# Patient Record
Sex: Male | Born: 1945 | Race: White | Hispanic: No | Marital: Married | Smoking: Never smoker
Health system: Southern US, Community
[De-identification: ages and names within clinical notes are randomized; demographics above are authoritative.]

## PROBLEM LIST (undated history)

## (undated) DIAGNOSIS — L570 Actinic keratosis: Secondary | ICD-10-CM

## (undated) DIAGNOSIS — I2510 Atherosclerotic heart disease of native coronary artery without angina pectoris: Secondary | ICD-10-CM

## (undated) DIAGNOSIS — E039 Hypothyroidism, unspecified: Secondary | ICD-10-CM

## (undated) DIAGNOSIS — C4491 Basal cell carcinoma of skin, unspecified: Secondary | ICD-10-CM

## (undated) DIAGNOSIS — Z8619 Personal history of other infectious and parasitic diseases: Secondary | ICD-10-CM

## (undated) DIAGNOSIS — E119 Type 2 diabetes mellitus without complications: Secondary | ICD-10-CM

## (undated) DIAGNOSIS — G4730 Sleep apnea, unspecified: Secondary | ICD-10-CM

## (undated) DIAGNOSIS — Z9989 Dependence on other enabling machines and devices: Secondary | ICD-10-CM

## (undated) DIAGNOSIS — I340 Nonrheumatic mitral (valve) insufficiency: Secondary | ICD-10-CM

## (undated) DIAGNOSIS — E785 Hyperlipidemia, unspecified: Secondary | ICD-10-CM

## (undated) DIAGNOSIS — M1990 Unspecified osteoarthritis, unspecified site: Secondary | ICD-10-CM

## (undated) DIAGNOSIS — C4492 Squamous cell carcinoma of skin, unspecified: Secondary | ICD-10-CM

## (undated) DIAGNOSIS — T7840XA Allergy, unspecified, initial encounter: Secondary | ICD-10-CM

## (undated) DIAGNOSIS — I639 Cerebral infarction, unspecified: Secondary | ICD-10-CM

## (undated) DIAGNOSIS — T8859XA Other complications of anesthesia, initial encounter: Secondary | ICD-10-CM

## (undated) DIAGNOSIS — K219 Gastro-esophageal reflux disease without esophagitis: Secondary | ICD-10-CM

## (undated) DIAGNOSIS — I10 Essential (primary) hypertension: Secondary | ICD-10-CM

## (undated) DIAGNOSIS — D099 Carcinoma in situ, unspecified: Secondary | ICD-10-CM

## (undated) DIAGNOSIS — H269 Unspecified cataract: Secondary | ICD-10-CM

## (undated) DIAGNOSIS — G4733 Obstructive sleep apnea (adult) (pediatric): Secondary | ICD-10-CM

## (undated) DIAGNOSIS — U071 COVID-19: Secondary | ICD-10-CM

## (undated) DIAGNOSIS — T4145XA Adverse effect of unspecified anesthetic, initial encounter: Secondary | ICD-10-CM

## (undated) DIAGNOSIS — I5189 Other ill-defined heart diseases: Secondary | ICD-10-CM

## (undated) DIAGNOSIS — I77810 Thoracic aortic ectasia: Secondary | ICD-10-CM

## (undated) DIAGNOSIS — C029 Malignant neoplasm of tongue, unspecified: Secondary | ICD-10-CM

## (undated) HISTORY — DX: Unspecified cataract: H26.9

## (undated) HISTORY — PX: JOINT REPLACEMENT: SHX530

## (undated) HISTORY — DX: Cerebral infarction, unspecified: I63.9

## (undated) HISTORY — DX: Hypothyroidism, unspecified: E03.9

## (undated) HISTORY — DX: Atherosclerotic heart disease of native coronary artery without angina pectoris: I25.10

## (undated) HISTORY — DX: Essential (primary) hypertension: I10

## (undated) HISTORY — DX: Personal history of other infectious and parasitic diseases: Z86.19

## (undated) HISTORY — DX: Gastro-esophageal reflux disease without esophagitis: K21.9

## (undated) HISTORY — PX: BIOPSY TONGUE: PRO39

## (undated) HISTORY — DX: Sleep apnea, unspecified: G47.30

## (undated) HISTORY — DX: Squamous cell carcinoma of skin, unspecified: C44.92

## (undated) HISTORY — DX: Allergy, unspecified, initial encounter: T78.40XA

## (undated) HISTORY — DX: Unspecified osteoarthritis, unspecified site: M19.90

## (undated) HISTORY — DX: Other ill-defined heart diseases: I51.89

## (undated) HISTORY — DX: Actinic keratosis: L57.0

## (undated) HISTORY — PX: OTHER SURGICAL HISTORY: SHX169

## (undated) HISTORY — DX: Hyperlipidemia, unspecified: E78.5

## (undated) HISTORY — DX: Malignant neoplasm of tongue, unspecified: C02.9

## (undated) HISTORY — DX: Thoracic aortic ectasia: I77.810

## (undated) HISTORY — DX: Obstructive sleep apnea (adult) (pediatric): G47.33

## (undated) HISTORY — DX: Nonrheumatic mitral (valve) insufficiency: I34.0

## (undated) HISTORY — DX: COVID-19: U07.1

## (undated) HISTORY — DX: Type 2 diabetes mellitus without complications: E11.9

## (undated) HISTORY — DX: Basal cell carcinoma of skin, unspecified: C44.91

## (undated) HISTORY — DX: Carcinoma in situ, unspecified: D09.9

## (undated) HISTORY — PX: VASECTOMY: SHX75

## (undated) HISTORY — PX: REPLACEMENT TOTAL KNEE: SUR1224

## (undated) HISTORY — DX: Dependence on other enabling machines and devices: Z99.89

---

## 2006-05-27 HISTORY — PX: COLONOSCOPY: SHX174

## 2006-11-22 ENCOUNTER — Ambulatory Visit: Payer: Self-pay | Admitting: Gastroenterology

## 2008-06-25 ENCOUNTER — Ambulatory Visit: Payer: Self-pay | Admitting: Unknown Physician Specialty

## 2009-09-16 ENCOUNTER — Ambulatory Visit: Payer: Self-pay | Admitting: Family Medicine

## 2009-11-05 ENCOUNTER — Ambulatory Visit: Payer: Self-pay | Admitting: Gastroenterology

## 2011-07-13 DIAGNOSIS — E782 Mixed hyperlipidemia: Secondary | ICD-10-CM | POA: Diagnosis not present

## 2011-07-13 DIAGNOSIS — I10 Essential (primary) hypertension: Secondary | ICD-10-CM | POA: Diagnosis not present

## 2011-07-13 DIAGNOSIS — E119 Type 2 diabetes mellitus without complications: Secondary | ICD-10-CM | POA: Diagnosis not present

## 2011-07-20 DIAGNOSIS — E039 Hypothyroidism, unspecified: Secondary | ICD-10-CM | POA: Diagnosis not present

## 2011-07-20 DIAGNOSIS — IMO0001 Reserved for inherently not codable concepts without codable children: Secondary | ICD-10-CM | POA: Diagnosis not present

## 2011-07-20 DIAGNOSIS — J209 Acute bronchitis, unspecified: Secondary | ICD-10-CM | POA: Diagnosis not present

## 2011-07-20 DIAGNOSIS — J45991 Cough variant asthma: Secondary | ICD-10-CM | POA: Diagnosis not present

## 2011-07-20 DIAGNOSIS — E782 Mixed hyperlipidemia: Secondary | ICD-10-CM | POA: Diagnosis not present

## 2011-09-02 DIAGNOSIS — J45991 Cough variant asthma: Secondary | ICD-10-CM | POA: Diagnosis not present

## 2011-09-02 DIAGNOSIS — J3089 Other allergic rhinitis: Secondary | ICD-10-CM | POA: Diagnosis not present

## 2011-10-01 DIAGNOSIS — L719 Rosacea, unspecified: Secondary | ICD-10-CM | POA: Diagnosis not present

## 2011-10-01 DIAGNOSIS — L570 Actinic keratosis: Secondary | ICD-10-CM | POA: Diagnosis not present

## 2011-11-30 DIAGNOSIS — E119 Type 2 diabetes mellitus without complications: Secondary | ICD-10-CM | POA: Diagnosis not present

## 2011-11-30 DIAGNOSIS — E039 Hypothyroidism, unspecified: Secondary | ICD-10-CM | POA: Diagnosis not present

## 2011-11-30 DIAGNOSIS — E782 Mixed hyperlipidemia: Secondary | ICD-10-CM | POA: Diagnosis not present

## 2011-12-07 DIAGNOSIS — E039 Hypothyroidism, unspecified: Secondary | ICD-10-CM | POA: Diagnosis not present

## 2011-12-07 DIAGNOSIS — IMO0001 Reserved for inherently not codable concepts without codable children: Secondary | ICD-10-CM | POA: Diagnosis not present

## 2011-12-07 DIAGNOSIS — E782 Mixed hyperlipidemia: Secondary | ICD-10-CM | POA: Diagnosis not present

## 2011-12-07 DIAGNOSIS — R7402 Elevation of levels of lactic acid dehydrogenase (LDH): Secondary | ICD-10-CM | POA: Diagnosis not present

## 2011-12-07 DIAGNOSIS — R7401 Elevation of levels of liver transaminase levels: Secondary | ICD-10-CM | POA: Diagnosis not present

## 2012-01-27 DIAGNOSIS — Z23 Encounter for immunization: Secondary | ICD-10-CM | POA: Diagnosis not present

## 2012-03-06 DIAGNOSIS — N138 Other obstructive and reflux uropathy: Secondary | ICD-10-CM

## 2012-03-06 DIAGNOSIS — R350 Frequency of micturition: Secondary | ICD-10-CM | POA: Diagnosis not present

## 2012-03-06 DIAGNOSIS — N401 Enlarged prostate with lower urinary tract symptoms: Secondary | ICD-10-CM

## 2012-03-06 DIAGNOSIS — R3911 Hesitancy of micturition: Secondary | ICD-10-CM | POA: Diagnosis not present

## 2012-03-06 HISTORY — DX: Benign prostatic hyperplasia with lower urinary tract symptoms: N13.8

## 2012-03-06 HISTORY — DX: Benign prostatic hyperplasia with lower urinary tract symptoms: N40.1

## 2012-06-01 DIAGNOSIS — E039 Hypothyroidism, unspecified: Secondary | ICD-10-CM | POA: Diagnosis not present

## 2012-06-01 DIAGNOSIS — I10 Essential (primary) hypertension: Secondary | ICD-10-CM | POA: Diagnosis not present

## 2012-06-01 DIAGNOSIS — E119 Type 2 diabetes mellitus without complications: Secondary | ICD-10-CM | POA: Diagnosis not present

## 2012-06-01 DIAGNOSIS — E782 Mixed hyperlipidemia: Secondary | ICD-10-CM | POA: Diagnosis not present

## 2012-06-13 DIAGNOSIS — I10 Essential (primary) hypertension: Secondary | ICD-10-CM | POA: Diagnosis not present

## 2012-06-13 DIAGNOSIS — E039 Hypothyroidism, unspecified: Secondary | ICD-10-CM | POA: Diagnosis not present

## 2012-06-13 DIAGNOSIS — IMO0001 Reserved for inherently not codable concepts without codable children: Secondary | ICD-10-CM | POA: Diagnosis not present

## 2012-06-13 DIAGNOSIS — E782 Mixed hyperlipidemia: Secondary | ICD-10-CM | POA: Diagnosis not present

## 2012-09-07 DIAGNOSIS — E039 Hypothyroidism, unspecified: Secondary | ICD-10-CM | POA: Diagnosis not present

## 2012-09-07 DIAGNOSIS — I10 Essential (primary) hypertension: Secondary | ICD-10-CM | POA: Diagnosis not present

## 2012-09-07 DIAGNOSIS — Z79899 Other long term (current) drug therapy: Secondary | ICD-10-CM | POA: Diagnosis not present

## 2012-09-07 DIAGNOSIS — E119 Type 2 diabetes mellitus without complications: Secondary | ICD-10-CM | POA: Diagnosis not present

## 2012-09-07 DIAGNOSIS — E782 Mixed hyperlipidemia: Secondary | ICD-10-CM | POA: Diagnosis not present

## 2012-09-14 DIAGNOSIS — E782 Mixed hyperlipidemia: Secondary | ICD-10-CM | POA: Diagnosis not present

## 2012-09-14 DIAGNOSIS — R7402 Elevation of levels of lactic acid dehydrogenase (LDH): Secondary | ICD-10-CM | POA: Diagnosis not present

## 2012-09-14 DIAGNOSIS — Z006 Encounter for examination for normal comparison and control in clinical research program: Secondary | ICD-10-CM | POA: Diagnosis not present

## 2012-09-14 DIAGNOSIS — I10 Essential (primary) hypertension: Secondary | ICD-10-CM | POA: Diagnosis not present

## 2012-09-14 DIAGNOSIS — R7401 Elevation of levels of liver transaminase levels: Secondary | ICD-10-CM | POA: Diagnosis not present

## 2012-09-14 DIAGNOSIS — E119 Type 2 diabetes mellitus without complications: Secondary | ICD-10-CM | POA: Diagnosis not present

## 2012-09-14 DIAGNOSIS — IMO0001 Reserved for inherently not codable concepts without codable children: Secondary | ICD-10-CM | POA: Diagnosis not present

## 2012-09-14 DIAGNOSIS — E039 Hypothyroidism, unspecified: Secondary | ICD-10-CM | POA: Diagnosis not present

## 2012-10-09 DIAGNOSIS — L719 Rosacea, unspecified: Secondary | ICD-10-CM | POA: Diagnosis not present

## 2012-10-09 DIAGNOSIS — L570 Actinic keratosis: Secondary | ICD-10-CM | POA: Diagnosis not present

## 2012-10-10 ENCOUNTER — Encounter: Payer: Self-pay | Admitting: Internal Medicine

## 2012-10-10 DIAGNOSIS — I10 Essential (primary) hypertension: Secondary | ICD-10-CM | POA: Diagnosis not present

## 2012-10-10 DIAGNOSIS — IMO0001 Reserved for inherently not codable concepts without codable children: Secondary | ICD-10-CM | POA: Diagnosis not present

## 2012-10-10 DIAGNOSIS — E119 Type 2 diabetes mellitus without complications: Secondary | ICD-10-CM | POA: Diagnosis not present

## 2012-10-10 DIAGNOSIS — Z79899 Other long term (current) drug therapy: Secondary | ICD-10-CM | POA: Diagnosis not present

## 2012-10-10 DIAGNOSIS — R7402 Elevation of levels of lactic acid dehydrogenase (LDH): Secondary | ICD-10-CM | POA: Diagnosis not present

## 2012-10-10 DIAGNOSIS — E039 Hypothyroidism, unspecified: Secondary | ICD-10-CM | POA: Diagnosis not present

## 2012-10-10 DIAGNOSIS — R7401 Elevation of levels of liver transaminase levels: Secondary | ICD-10-CM | POA: Diagnosis not present

## 2012-10-24 DIAGNOSIS — L570 Actinic keratosis: Secondary | ICD-10-CM | POA: Diagnosis not present

## 2012-11-08 DIAGNOSIS — E039 Hypothyroidism, unspecified: Secondary | ICD-10-CM | POA: Diagnosis not present

## 2012-11-28 DIAGNOSIS — L570 Actinic keratosis: Secondary | ICD-10-CM | POA: Diagnosis not present

## 2013-01-09 DIAGNOSIS — E782 Mixed hyperlipidemia: Secondary | ICD-10-CM | POA: Diagnosis not present

## 2013-01-09 DIAGNOSIS — E039 Hypothyroidism, unspecified: Secondary | ICD-10-CM | POA: Diagnosis not present

## 2013-01-09 DIAGNOSIS — G4720 Circadian rhythm sleep disorder, unspecified type: Secondary | ICD-10-CM | POA: Diagnosis not present

## 2013-01-09 DIAGNOSIS — IMO0001 Reserved for inherently not codable concepts without codable children: Secondary | ICD-10-CM | POA: Diagnosis not present

## 2013-03-12 DIAGNOSIS — N138 Other obstructive and reflux uropathy: Secondary | ICD-10-CM | POA: Diagnosis not present

## 2013-03-12 DIAGNOSIS — M1990 Unspecified osteoarthritis, unspecified site: Secondary | ICD-10-CM | POA: Insufficient documentation

## 2013-03-12 DIAGNOSIS — E119 Type 2 diabetes mellitus without complications: Secondary | ICD-10-CM

## 2013-03-12 DIAGNOSIS — R350 Frequency of micturition: Secondary | ICD-10-CM | POA: Insufficient documentation

## 2013-03-12 DIAGNOSIS — R3911 Hesitancy of micturition: Secondary | ICD-10-CM | POA: Insufficient documentation

## 2013-03-12 DIAGNOSIS — N401 Enlarged prostate with lower urinary tract symptoms: Secondary | ICD-10-CM | POA: Diagnosis not present

## 2013-03-12 DIAGNOSIS — R39198 Other difficulties with micturition: Secondary | ICD-10-CM

## 2013-03-12 HISTORY — DX: Other difficulties with micturition: R39.198

## 2013-03-12 HISTORY — DX: Hesitancy of micturition: R39.11

## 2013-03-12 HISTORY — DX: Type 2 diabetes mellitus without complications: E11.9

## 2013-03-12 HISTORY — DX: Frequency of micturition: R35.0

## 2013-03-19 DIAGNOSIS — N138 Other obstructive and reflux uropathy: Secondary | ICD-10-CM | POA: Diagnosis not present

## 2013-03-19 DIAGNOSIS — N401 Enlarged prostate with lower urinary tract symptoms: Secondary | ICD-10-CM | POA: Diagnosis not present

## 2013-03-28 ENCOUNTER — Ambulatory Visit: Payer: Self-pay

## 2013-03-28 DIAGNOSIS — E119 Type 2 diabetes mellitus without complications: Secondary | ICD-10-CM | POA: Diagnosis not present

## 2013-03-28 DIAGNOSIS — M25569 Pain in unspecified knee: Secondary | ICD-10-CM | POA: Diagnosis not present

## 2013-03-28 DIAGNOSIS — I10 Essential (primary) hypertension: Secondary | ICD-10-CM | POA: Diagnosis not present

## 2013-03-28 DIAGNOSIS — Z23 Encounter for immunization: Secondary | ICD-10-CM | POA: Diagnosis not present

## 2013-03-28 DIAGNOSIS — M1710 Unilateral primary osteoarthritis, unspecified knee: Secondary | ICD-10-CM | POA: Diagnosis not present

## 2013-03-28 DIAGNOSIS — IMO0002 Reserved for concepts with insufficient information to code with codable children: Secondary | ICD-10-CM | POA: Diagnosis not present

## 2013-04-03 DIAGNOSIS — M1710 Unilateral primary osteoarthritis, unspecified knee: Secondary | ICD-10-CM | POA: Diagnosis not present

## 2013-04-03 DIAGNOSIS — M25559 Pain in unspecified hip: Secondary | ICD-10-CM | POA: Diagnosis not present

## 2013-04-03 DIAGNOSIS — IMO0002 Reserved for concepts with insufficient information to code with codable children: Secondary | ICD-10-CM | POA: Diagnosis not present

## 2013-05-02 DIAGNOSIS — IMO0002 Reserved for concepts with insufficient information to code with codable children: Secondary | ICD-10-CM | POA: Diagnosis not present

## 2013-05-02 DIAGNOSIS — M549 Dorsalgia, unspecified: Secondary | ICD-10-CM | POA: Diagnosis not present

## 2013-05-07 DIAGNOSIS — Z79899 Other long term (current) drug therapy: Secondary | ICD-10-CM | POA: Diagnosis not present

## 2013-05-07 DIAGNOSIS — E1065 Type 1 diabetes mellitus with hyperglycemia: Secondary | ICD-10-CM | POA: Diagnosis not present

## 2013-05-07 DIAGNOSIS — E782 Mixed hyperlipidemia: Secondary | ICD-10-CM | POA: Diagnosis not present

## 2013-05-07 DIAGNOSIS — IMO0002 Reserved for concepts with insufficient information to code with codable children: Secondary | ICD-10-CM | POA: Diagnosis not present

## 2013-05-07 DIAGNOSIS — I10 Essential (primary) hypertension: Secondary | ICD-10-CM | POA: Diagnosis not present

## 2013-05-07 DIAGNOSIS — Z125 Encounter for screening for malignant neoplasm of prostate: Secondary | ICD-10-CM | POA: Diagnosis not present

## 2013-05-10 DIAGNOSIS — M545 Low back pain, unspecified: Secondary | ICD-10-CM | POA: Diagnosis not present

## 2013-05-10 DIAGNOSIS — E039 Hypothyroidism, unspecified: Secondary | ICD-10-CM | POA: Diagnosis not present

## 2013-05-10 DIAGNOSIS — E782 Mixed hyperlipidemia: Secondary | ICD-10-CM | POA: Diagnosis not present

## 2013-05-10 DIAGNOSIS — I10 Essential (primary) hypertension: Secondary | ICD-10-CM | POA: Diagnosis not present

## 2013-05-10 DIAGNOSIS — IMO0001 Reserved for inherently not codable concepts without codable children: Secondary | ICD-10-CM | POA: Diagnosis not present

## 2013-05-21 DIAGNOSIS — M25559 Pain in unspecified hip: Secondary | ICD-10-CM | POA: Diagnosis not present

## 2013-05-21 DIAGNOSIS — M6281 Muscle weakness (generalized): Secondary | ICD-10-CM | POA: Diagnosis not present

## 2013-05-24 DIAGNOSIS — M6281 Muscle weakness (generalized): Secondary | ICD-10-CM | POA: Diagnosis not present

## 2013-05-24 DIAGNOSIS — M25559 Pain in unspecified hip: Secondary | ICD-10-CM | POA: Diagnosis not present

## 2013-05-29 DIAGNOSIS — M25559 Pain in unspecified hip: Secondary | ICD-10-CM | POA: Diagnosis not present

## 2013-05-29 DIAGNOSIS — M6281 Muscle weakness (generalized): Secondary | ICD-10-CM | POA: Diagnosis not present

## 2013-05-31 DIAGNOSIS — M25559 Pain in unspecified hip: Secondary | ICD-10-CM | POA: Diagnosis not present

## 2013-05-31 DIAGNOSIS — M6281 Muscle weakness (generalized): Secondary | ICD-10-CM | POA: Diagnosis not present

## 2013-06-05 DIAGNOSIS — M6281 Muscle weakness (generalized): Secondary | ICD-10-CM | POA: Diagnosis not present

## 2013-06-05 DIAGNOSIS — M25559 Pain in unspecified hip: Secondary | ICD-10-CM | POA: Diagnosis not present

## 2013-06-07 DIAGNOSIS — M6281 Muscle weakness (generalized): Secondary | ICD-10-CM | POA: Diagnosis not present

## 2013-06-07 DIAGNOSIS — M25559 Pain in unspecified hip: Secondary | ICD-10-CM | POA: Diagnosis not present

## 2013-06-14 DIAGNOSIS — M25559 Pain in unspecified hip: Secondary | ICD-10-CM | POA: Diagnosis not present

## 2013-06-14 DIAGNOSIS — M6281 Muscle weakness (generalized): Secondary | ICD-10-CM | POA: Diagnosis not present

## 2013-06-15 DIAGNOSIS — M4800 Spinal stenosis, site unspecified: Secondary | ICD-10-CM | POA: Diagnosis not present

## 2013-06-20 ENCOUNTER — Ambulatory Visit: Payer: Self-pay | Admitting: Orthopedic Surgery

## 2013-06-20 DIAGNOSIS — M5137 Other intervertebral disc degeneration, lumbosacral region: Secondary | ICD-10-CM | POA: Diagnosis not present

## 2013-06-20 DIAGNOSIS — M5126 Other intervertebral disc displacement, lumbar region: Secondary | ICD-10-CM | POA: Diagnosis not present

## 2013-06-20 DIAGNOSIS — M5020 Other cervical disc displacement, unspecified cervical region: Secondary | ICD-10-CM | POA: Diagnosis not present

## 2013-07-16 DIAGNOSIS — L719 Rosacea, unspecified: Secondary | ICD-10-CM | POA: Diagnosis not present

## 2013-07-16 DIAGNOSIS — D1801 Hemangioma of skin and subcutaneous tissue: Secondary | ICD-10-CM | POA: Diagnosis not present

## 2013-07-16 DIAGNOSIS — L570 Actinic keratosis: Secondary | ICD-10-CM | POA: Diagnosis not present

## 2013-07-16 DIAGNOSIS — L821 Other seborrheic keratosis: Secondary | ICD-10-CM | POA: Diagnosis not present

## 2013-07-16 DIAGNOSIS — D0430 Carcinoma in situ of skin of unspecified part of face: Secondary | ICD-10-CM | POA: Diagnosis not present

## 2013-07-16 DIAGNOSIS — D485 Neoplasm of uncertain behavior of skin: Secondary | ICD-10-CM | POA: Diagnosis not present

## 2013-07-16 DIAGNOSIS — D0439 Carcinoma in situ of skin of other parts of face: Secondary | ICD-10-CM | POA: Diagnosis not present

## 2013-07-24 DIAGNOSIS — M5137 Other intervertebral disc degeneration, lumbosacral region: Secondary | ICD-10-CM | POA: Diagnosis not present

## 2013-07-24 DIAGNOSIS — IMO0002 Reserved for concepts with insufficient information to code with codable children: Secondary | ICD-10-CM | POA: Diagnosis not present

## 2013-07-24 DIAGNOSIS — M47817 Spondylosis without myelopathy or radiculopathy, lumbosacral region: Secondary | ICD-10-CM | POA: Diagnosis not present

## 2013-08-22 DIAGNOSIS — D0430 Carcinoma in situ of skin of unspecified part of face: Secondary | ICD-10-CM | POA: Diagnosis not present

## 2013-08-22 DIAGNOSIS — D0439 Carcinoma in situ of skin of other parts of face: Secondary | ICD-10-CM | POA: Diagnosis not present

## 2013-09-11 DIAGNOSIS — E039 Hypothyroidism, unspecified: Secondary | ICD-10-CM | POA: Diagnosis not present

## 2013-09-11 DIAGNOSIS — M1710 Unilateral primary osteoarthritis, unspecified knee: Secondary | ICD-10-CM | POA: Diagnosis not present

## 2013-09-11 DIAGNOSIS — I10 Essential (primary) hypertension: Secondary | ICD-10-CM | POA: Diagnosis not present

## 2013-09-11 DIAGNOSIS — R300 Dysuria: Secondary | ICD-10-CM | POA: Diagnosis not present

## 2013-09-11 DIAGNOSIS — Z0000 Encounter for general adult medical examination without abnormal findings: Secondary | ICD-10-CM | POA: Diagnosis not present

## 2013-09-11 DIAGNOSIS — R7401 Elevation of levels of liver transaminase levels: Secondary | ICD-10-CM | POA: Diagnosis not present

## 2013-09-11 DIAGNOSIS — IMO0002 Reserved for concepts with insufficient information to code with codable children: Secondary | ICD-10-CM | POA: Diagnosis not present

## 2013-09-11 DIAGNOSIS — R7402 Elevation of levels of lactic acid dehydrogenase (LDH): Secondary | ICD-10-CM | POA: Diagnosis not present

## 2013-09-11 DIAGNOSIS — IMO0001 Reserved for inherently not codable concepts without codable children: Secondary | ICD-10-CM | POA: Diagnosis not present

## 2013-09-11 DIAGNOSIS — E782 Mixed hyperlipidemia: Secondary | ICD-10-CM | POA: Diagnosis not present

## 2013-11-20 DIAGNOSIS — R809 Proteinuria, unspecified: Secondary | ICD-10-CM | POA: Diagnosis not present

## 2013-11-20 DIAGNOSIS — I10 Essential (primary) hypertension: Secondary | ICD-10-CM | POA: Diagnosis not present

## 2013-11-20 DIAGNOSIS — E119 Type 2 diabetes mellitus without complications: Secondary | ICD-10-CM | POA: Diagnosis not present

## 2013-11-20 DIAGNOSIS — N390 Urinary tract infection, site not specified: Secondary | ICD-10-CM | POA: Diagnosis not present

## 2013-11-26 DIAGNOSIS — R809 Proteinuria, unspecified: Secondary | ICD-10-CM | POA: Diagnosis not present

## 2013-11-30 DIAGNOSIS — R1031 Right lower quadrant pain: Secondary | ICD-10-CM | POA: Diagnosis not present

## 2013-11-30 DIAGNOSIS — N138 Other obstructive and reflux uropathy: Secondary | ICD-10-CM | POA: Diagnosis not present

## 2013-11-30 DIAGNOSIS — N401 Enlarged prostate with lower urinary tract symptoms: Secondary | ICD-10-CM | POA: Diagnosis not present

## 2013-11-30 DIAGNOSIS — R350 Frequency of micturition: Secondary | ICD-10-CM | POA: Diagnosis not present

## 2013-11-30 DIAGNOSIS — Q6101 Congenital single renal cyst: Secondary | ICD-10-CM | POA: Diagnosis not present

## 2013-11-30 DIAGNOSIS — R109 Unspecified abdominal pain: Secondary | ICD-10-CM | POA: Insufficient documentation

## 2013-11-30 DIAGNOSIS — N281 Cyst of kidney, acquired: Secondary | ICD-10-CM | POA: Diagnosis not present

## 2013-11-30 DIAGNOSIS — R3911 Hesitancy of micturition: Secondary | ICD-10-CM | POA: Diagnosis not present

## 2013-11-30 DIAGNOSIS — E119 Type 2 diabetes mellitus without complications: Secondary | ICD-10-CM | POA: Diagnosis not present

## 2013-11-30 DIAGNOSIS — K5730 Diverticulosis of large intestine without perforation or abscess without bleeding: Secondary | ICD-10-CM | POA: Diagnosis not present

## 2013-11-30 DIAGNOSIS — Z79899 Other long term (current) drug therapy: Secondary | ICD-10-CM | POA: Diagnosis not present

## 2013-11-30 DIAGNOSIS — J439 Emphysema, unspecified: Secondary | ICD-10-CM | POA: Diagnosis not present

## 2013-11-30 DIAGNOSIS — E039 Hypothyroidism, unspecified: Secondary | ICD-10-CM | POA: Diagnosis not present

## 2013-11-30 HISTORY — DX: Unspecified abdominal pain: R10.9

## 2013-12-18 DIAGNOSIS — R109 Unspecified abdominal pain: Secondary | ICD-10-CM | POA: Diagnosis not present

## 2013-12-18 DIAGNOSIS — N281 Cyst of kidney, acquired: Secondary | ICD-10-CM

## 2013-12-18 HISTORY — DX: Cyst of kidney, acquired: N28.1

## 2013-12-19 DIAGNOSIS — E039 Hypothyroidism, unspecified: Secondary | ICD-10-CM | POA: Diagnosis not present

## 2013-12-19 DIAGNOSIS — IMO0001 Reserved for inherently not codable concepts without codable children: Secondary | ICD-10-CM | POA: Diagnosis not present

## 2013-12-19 DIAGNOSIS — R7989 Other specified abnormal findings of blood chemistry: Secondary | ICD-10-CM | POA: Diagnosis not present

## 2013-12-19 DIAGNOSIS — E782 Mixed hyperlipidemia: Secondary | ICD-10-CM | POA: Diagnosis not present

## 2013-12-19 DIAGNOSIS — Z0000 Encounter for general adult medical examination without abnormal findings: Secondary | ICD-10-CM | POA: Diagnosis not present

## 2013-12-25 DIAGNOSIS — IMO0002 Reserved for concepts with insufficient information to code with codable children: Secondary | ICD-10-CM | POA: Diagnosis not present

## 2013-12-25 DIAGNOSIS — IMO0001 Reserved for inherently not codable concepts without codable children: Secondary | ICD-10-CM | POA: Diagnosis not present

## 2013-12-25 DIAGNOSIS — M545 Low back pain, unspecified: Secondary | ICD-10-CM | POA: Diagnosis not present

## 2013-12-25 DIAGNOSIS — E039 Hypothyroidism, unspecified: Secondary | ICD-10-CM | POA: Diagnosis not present

## 2013-12-25 DIAGNOSIS — G4710 Hypersomnia, unspecified: Secondary | ICD-10-CM | POA: Diagnosis not present

## 2013-12-25 DIAGNOSIS — G4730 Sleep apnea, unspecified: Secondary | ICD-10-CM | POA: Diagnosis not present

## 2013-12-26 DIAGNOSIS — M5126 Other intervertebral disc displacement, lumbar region: Secondary | ICD-10-CM | POA: Diagnosis not present

## 2013-12-27 DIAGNOSIS — Z23 Encounter for immunization: Secondary | ICD-10-CM | POA: Diagnosis not present

## 2014-01-16 DIAGNOSIS — M5126 Other intervertebral disc displacement, lumbar region: Secondary | ICD-10-CM | POA: Insufficient documentation

## 2014-01-16 DIAGNOSIS — M5116 Intervertebral disc disorders with radiculopathy, lumbar region: Secondary | ICD-10-CM

## 2014-01-16 DIAGNOSIS — IMO0002 Reserved for concepts with insufficient information to code with codable children: Secondary | ICD-10-CM | POA: Diagnosis not present

## 2014-01-16 HISTORY — DX: Intervertebral disc disorders with radiculopathy, lumbar region: M51.16

## 2014-01-16 HISTORY — DX: Other intervertebral disc displacement, lumbar region: M51.26

## 2014-01-18 DIAGNOSIS — M5126 Other intervertebral disc displacement, lumbar region: Secondary | ICD-10-CM | POA: Diagnosis not present

## 2014-01-18 DIAGNOSIS — IMO0002 Reserved for concepts with insufficient information to code with codable children: Secondary | ICD-10-CM | POA: Diagnosis not present

## 2014-02-05 DIAGNOSIS — L821 Other seborrheic keratosis: Secondary | ICD-10-CM | POA: Diagnosis not present

## 2014-02-05 DIAGNOSIS — L570 Actinic keratosis: Secondary | ICD-10-CM | POA: Diagnosis not present

## 2014-02-05 DIAGNOSIS — Z85828 Personal history of other malignant neoplasm of skin: Secondary | ICD-10-CM | POA: Diagnosis not present

## 2014-02-05 DIAGNOSIS — D044 Carcinoma in situ of skin of scalp and neck: Secondary | ICD-10-CM | POA: Diagnosis not present

## 2014-02-05 DIAGNOSIS — X32XXXA Exposure to sunlight, initial encounter: Secondary | ICD-10-CM | POA: Diagnosis not present

## 2014-02-05 DIAGNOSIS — D0439 Carcinoma in situ of skin of other parts of face: Secondary | ICD-10-CM | POA: Diagnosis not present

## 2014-02-14 DIAGNOSIS — M5126 Other intervertebral disc displacement, lumbar region: Secondary | ICD-10-CM | POA: Diagnosis not present

## 2014-02-14 DIAGNOSIS — M5416 Radiculopathy, lumbar region: Secondary | ICD-10-CM | POA: Diagnosis not present

## 2014-05-02 DIAGNOSIS — E119 Type 2 diabetes mellitus without complications: Secondary | ICD-10-CM | POA: Diagnosis not present

## 2014-05-02 DIAGNOSIS — E782 Mixed hyperlipidemia: Secondary | ICD-10-CM | POA: Diagnosis not present

## 2014-05-02 DIAGNOSIS — I10 Essential (primary) hypertension: Secondary | ICD-10-CM | POA: Diagnosis not present

## 2014-05-02 DIAGNOSIS — E039 Hypothyroidism, unspecified: Secondary | ICD-10-CM | POA: Diagnosis not present

## 2014-05-02 DIAGNOSIS — E669 Obesity, unspecified: Secondary | ICD-10-CM | POA: Diagnosis not present

## 2014-05-06 DIAGNOSIS — L570 Actinic keratosis: Secondary | ICD-10-CM | POA: Diagnosis not present

## 2014-05-06 DIAGNOSIS — X32XXXA Exposure to sunlight, initial encounter: Secondary | ICD-10-CM | POA: Diagnosis not present

## 2014-05-07 DIAGNOSIS — E039 Hypothyroidism, unspecified: Secondary | ICD-10-CM | POA: Diagnosis not present

## 2014-05-07 DIAGNOSIS — R802 Orthostatic proteinuria, unspecified: Secondary | ICD-10-CM | POA: Diagnosis not present

## 2014-05-07 DIAGNOSIS — E1165 Type 2 diabetes mellitus with hyperglycemia: Secondary | ICD-10-CM | POA: Diagnosis not present

## 2014-05-07 DIAGNOSIS — E782 Mixed hyperlipidemia: Secondary | ICD-10-CM | POA: Diagnosis not present

## 2014-05-07 DIAGNOSIS — Z125 Encounter for screening for malignant neoplasm of prostate: Secondary | ICD-10-CM | POA: Diagnosis not present

## 2014-05-07 DIAGNOSIS — M25561 Pain in right knee: Secondary | ICD-10-CM | POA: Diagnosis not present

## 2014-05-07 DIAGNOSIS — I10 Essential (primary) hypertension: Secondary | ICD-10-CM | POA: Diagnosis not present

## 2014-05-08 DIAGNOSIS — M5416 Radiculopathy, lumbar region: Secondary | ICD-10-CM | POA: Diagnosis not present

## 2014-05-08 DIAGNOSIS — M5126 Other intervertebral disc displacement, lumbar region: Secondary | ICD-10-CM | POA: Diagnosis not present

## 2014-05-08 DIAGNOSIS — M170 Bilateral primary osteoarthritis of knee: Secondary | ICD-10-CM | POA: Diagnosis not present

## 2014-05-13 DIAGNOSIS — M170 Bilateral primary osteoarthritis of knee: Secondary | ICD-10-CM

## 2014-05-13 HISTORY — DX: Bilateral primary osteoarthritis of knee: M17.0

## 2014-06-07 DIAGNOSIS — L570 Actinic keratosis: Secondary | ICD-10-CM | POA: Diagnosis not present

## 2014-06-07 DIAGNOSIS — X32XXXA Exposure to sunlight, initial encounter: Secondary | ICD-10-CM | POA: Diagnosis not present

## 2014-06-17 DIAGNOSIS — H4922 Sixth [abducent] nerve palsy, left eye: Secondary | ICD-10-CM | POA: Diagnosis not present

## 2014-07-03 ENCOUNTER — Ambulatory Visit: Payer: Self-pay | Admitting: Ophthalmology

## 2014-07-03 DIAGNOSIS — Z01812 Encounter for preprocedural laboratory examination: Secondary | ICD-10-CM | POA: Diagnosis not present

## 2014-07-03 DIAGNOSIS — Z23 Encounter for immunization: Secondary | ICD-10-CM | POA: Diagnosis not present

## 2014-07-03 DIAGNOSIS — H4922 Sixth [abducent] nerve palsy, left eye: Secondary | ICD-10-CM | POA: Diagnosis not present

## 2014-07-03 DIAGNOSIS — H532 Diplopia: Secondary | ICD-10-CM | POA: Diagnosis not present

## 2014-07-10 DIAGNOSIS — H4922 Sixth [abducent] nerve palsy, left eye: Secondary | ICD-10-CM | POA: Diagnosis not present

## 2014-08-12 DIAGNOSIS — H4912 Fourth [trochlear] nerve palsy, left eye: Secondary | ICD-10-CM | POA: Diagnosis not present

## 2014-08-27 DIAGNOSIS — G4733 Obstructive sleep apnea (adult) (pediatric): Secondary | ICD-10-CM | POA: Diagnosis not present

## 2014-09-03 DIAGNOSIS — R740 Nonspecific elevation of levels of transaminase and lactic acid dehydrogenase [LDH]: Secondary | ICD-10-CM | POA: Diagnosis not present

## 2014-09-03 DIAGNOSIS — R351 Nocturia: Secondary | ICD-10-CM | POA: Diagnosis not present

## 2014-09-03 DIAGNOSIS — I10 Essential (primary) hypertension: Secondary | ICD-10-CM | POA: Diagnosis not present

## 2014-09-03 DIAGNOSIS — G4733 Obstructive sleep apnea (adult) (pediatric): Secondary | ICD-10-CM | POA: Diagnosis not present

## 2014-09-03 DIAGNOSIS — E1165 Type 2 diabetes mellitus with hyperglycemia: Secondary | ICD-10-CM | POA: Diagnosis not present

## 2014-09-03 DIAGNOSIS — E782 Mixed hyperlipidemia: Secondary | ICD-10-CM | POA: Diagnosis not present

## 2014-10-08 DIAGNOSIS — L570 Actinic keratosis: Secondary | ICD-10-CM | POA: Diagnosis not present

## 2014-10-08 DIAGNOSIS — D225 Melanocytic nevi of trunk: Secondary | ICD-10-CM | POA: Diagnosis not present

## 2014-10-08 DIAGNOSIS — Z85828 Personal history of other malignant neoplasm of skin: Secondary | ICD-10-CM | POA: Diagnosis not present

## 2014-10-08 DIAGNOSIS — X32XXXA Exposure to sunlight, initial encounter: Secondary | ICD-10-CM | POA: Diagnosis not present

## 2014-10-31 DIAGNOSIS — J069 Acute upper respiratory infection, unspecified: Secondary | ICD-10-CM | POA: Diagnosis not present

## 2014-11-11 DIAGNOSIS — J00 Acute nasopharyngitis [common cold]: Secondary | ICD-10-CM | POA: Diagnosis not present

## 2015-01-27 DIAGNOSIS — Z23 Encounter for immunization: Secondary | ICD-10-CM | POA: Diagnosis not present

## 2015-01-27 DIAGNOSIS — Z0001 Encounter for general adult medical examination with abnormal findings: Secondary | ICD-10-CM | POA: Diagnosis not present

## 2015-01-27 DIAGNOSIS — G4733 Obstructive sleep apnea (adult) (pediatric): Secondary | ICD-10-CM | POA: Diagnosis not present

## 2015-01-27 DIAGNOSIS — E782 Mixed hyperlipidemia: Secondary | ICD-10-CM | POA: Diagnosis not present

## 2015-01-27 DIAGNOSIS — E039 Hypothyroidism, unspecified: Secondary | ICD-10-CM | POA: Diagnosis not present

## 2015-01-27 DIAGNOSIS — E1165 Type 2 diabetes mellitus with hyperglycemia: Secondary | ICD-10-CM | POA: Diagnosis not present

## 2015-01-27 DIAGNOSIS — R740 Nonspecific elevation of levels of transaminase and lactic acid dehydrogenase [LDH]: Secondary | ICD-10-CM | POA: Diagnosis not present

## 2015-01-27 DIAGNOSIS — I10 Essential (primary) hypertension: Secondary | ICD-10-CM | POA: Diagnosis not present

## 2015-01-29 DIAGNOSIS — Z0001 Encounter for general adult medical examination with abnormal findings: Secondary | ICD-10-CM | POA: Diagnosis not present

## 2015-01-29 DIAGNOSIS — I10 Essential (primary) hypertension: Secondary | ICD-10-CM | POA: Diagnosis not present

## 2015-01-29 DIAGNOSIS — E782 Mixed hyperlipidemia: Secondary | ICD-10-CM | POA: Diagnosis not present

## 2015-02-12 DIAGNOSIS — E119 Type 2 diabetes mellitus without complications: Secondary | ICD-10-CM | POA: Diagnosis not present

## 2015-04-07 DIAGNOSIS — M5116 Intervertebral disc disorders with radiculopathy, lumbar region: Secondary | ICD-10-CM | POA: Diagnosis not present

## 2015-04-07 DIAGNOSIS — M549 Dorsalgia, unspecified: Secondary | ICD-10-CM | POA: Diagnosis not present

## 2015-04-07 DIAGNOSIS — M47816 Spondylosis without myelopathy or radiculopathy, lumbar region: Secondary | ICD-10-CM | POA: Diagnosis not present

## 2015-04-10 DIAGNOSIS — E039 Hypothyroidism, unspecified: Secondary | ICD-10-CM | POA: Diagnosis not present

## 2015-04-10 DIAGNOSIS — M5116 Intervertebral disc disorders with radiculopathy, lumbar region: Secondary | ICD-10-CM | POA: Diagnosis not present

## 2015-04-10 DIAGNOSIS — E119 Type 2 diabetes mellitus without complications: Secondary | ICD-10-CM | POA: Diagnosis not present

## 2015-04-10 DIAGNOSIS — M4726 Other spondylosis with radiculopathy, lumbar region: Secondary | ICD-10-CM | POA: Diagnosis not present

## 2015-05-13 DIAGNOSIS — G4733 Obstructive sleep apnea (adult) (pediatric): Secondary | ICD-10-CM | POA: Diagnosis not present

## 2015-05-14 DIAGNOSIS — M5126 Other intervertebral disc displacement, lumbar region: Secondary | ICD-10-CM | POA: Diagnosis not present

## 2015-05-14 DIAGNOSIS — M5416 Radiculopathy, lumbar region: Secondary | ICD-10-CM | POA: Diagnosis not present

## 2015-05-14 DIAGNOSIS — M5136 Other intervertebral disc degeneration, lumbar region: Secondary | ICD-10-CM | POA: Diagnosis not present

## 2015-05-23 ENCOUNTER — Other Ambulatory Visit: Payer: Self-pay | Admitting: Physical Medicine and Rehabilitation

## 2015-05-23 DIAGNOSIS — M5416 Radiculopathy, lumbar region: Secondary | ICD-10-CM

## 2015-06-03 DIAGNOSIS — M5117 Intervertebral disc disorders with radiculopathy, lumbosacral region: Secondary | ICD-10-CM | POA: Diagnosis not present

## 2015-06-03 DIAGNOSIS — E1165 Type 2 diabetes mellitus with hyperglycemia: Secondary | ICD-10-CM | POA: Diagnosis not present

## 2015-06-03 DIAGNOSIS — I10 Essential (primary) hypertension: Secondary | ICD-10-CM | POA: Diagnosis not present

## 2015-06-03 DIAGNOSIS — E039 Hypothyroidism, unspecified: Secondary | ICD-10-CM | POA: Diagnosis not present

## 2015-06-05 ENCOUNTER — Ambulatory Visit
Admission: RE | Admit: 2015-06-05 | Discharge: 2015-06-05 | Disposition: A | Discharge status: Home / Self Care | Payer: Medicare Other | Source: Ambulatory Visit | Attending: Physical Medicine and Rehabilitation | Admitting: Physical Medicine and Rehabilitation

## 2015-06-05 DIAGNOSIS — M545 Low back pain: Secondary | ICD-10-CM | POA: Diagnosis not present

## 2015-06-05 DIAGNOSIS — M5116 Intervertebral disc disorders with radiculopathy, lumbar region: Secondary | ICD-10-CM | POA: Insufficient documentation

## 2015-06-05 DIAGNOSIS — M5416 Radiculopathy, lumbar region: Secondary | ICD-10-CM | POA: Diagnosis not present

## 2015-06-05 DIAGNOSIS — M4806 Spinal stenosis, lumbar region: Secondary | ICD-10-CM | POA: Diagnosis not present

## 2015-06-05 DIAGNOSIS — M5136 Other intervertebral disc degeneration, lumbar region: Secondary | ICD-10-CM | POA: Insufficient documentation

## 2015-06-05 DIAGNOSIS — M25551 Pain in right hip: Secondary | ICD-10-CM | POA: Diagnosis not present

## 2015-06-17 DIAGNOSIS — L570 Actinic keratosis: Secondary | ICD-10-CM | POA: Diagnosis not present

## 2015-06-17 DIAGNOSIS — L820 Inflamed seborrheic keratosis: Secondary | ICD-10-CM | POA: Diagnosis not present

## 2015-06-17 DIAGNOSIS — X32XXXA Exposure to sunlight, initial encounter: Secondary | ICD-10-CM | POA: Diagnosis not present

## 2015-06-17 DIAGNOSIS — D485 Neoplasm of uncertain behavior of skin: Secondary | ICD-10-CM | POA: Diagnosis not present

## 2015-06-17 DIAGNOSIS — Z85828 Personal history of other malignant neoplasm of skin: Secondary | ICD-10-CM | POA: Diagnosis not present

## 2015-06-26 DIAGNOSIS — M5416 Radiculopathy, lumbar region: Secondary | ICD-10-CM | POA: Diagnosis not present

## 2015-06-26 DIAGNOSIS — M5126 Other intervertebral disc displacement, lumbar region: Secondary | ICD-10-CM | POA: Diagnosis not present

## 2015-06-26 DIAGNOSIS — M5136 Other intervertebral disc degeneration, lumbar region: Secondary | ICD-10-CM | POA: Diagnosis not present

## 2015-07-08 DIAGNOSIS — M4726 Other spondylosis with radiculopathy, lumbar region: Secondary | ICD-10-CM | POA: Diagnosis not present

## 2015-08-04 DIAGNOSIS — M5126 Other intervertebral disc displacement, lumbar region: Secondary | ICD-10-CM | POA: Diagnosis not present

## 2015-08-04 DIAGNOSIS — M5416 Radiculopathy, lumbar region: Secondary | ICD-10-CM | POA: Diagnosis not present

## 2015-08-04 DIAGNOSIS — M5136 Other intervertebral disc degeneration, lumbar region: Secondary | ICD-10-CM | POA: Diagnosis not present

## 2015-09-23 DIAGNOSIS — M5416 Radiculopathy, lumbar region: Secondary | ICD-10-CM | POA: Diagnosis not present

## 2015-09-23 DIAGNOSIS — M5136 Other intervertebral disc degeneration, lumbar region: Secondary | ICD-10-CM | POA: Diagnosis not present

## 2015-09-23 DIAGNOSIS — M5126 Other intervertebral disc displacement, lumbar region: Secondary | ICD-10-CM | POA: Diagnosis not present

## 2015-09-23 DIAGNOSIS — G4733 Obstructive sleep apnea (adult) (pediatric): Secondary | ICD-10-CM | POA: Diagnosis not present

## 2015-10-02 DIAGNOSIS — G4733 Obstructive sleep apnea (adult) (pediatric): Secondary | ICD-10-CM | POA: Diagnosis not present

## 2015-10-02 DIAGNOSIS — R0602 Shortness of breath: Secondary | ICD-10-CM | POA: Diagnosis not present

## 2015-10-07 DIAGNOSIS — M5117 Intervertebral disc disorders with radiculopathy, lumbosacral region: Secondary | ICD-10-CM | POA: Diagnosis not present

## 2015-10-07 DIAGNOSIS — G4733 Obstructive sleep apnea (adult) (pediatric): Secondary | ICD-10-CM | POA: Diagnosis not present

## 2015-10-07 DIAGNOSIS — R0602 Shortness of breath: Secondary | ICD-10-CM | POA: Diagnosis not present

## 2015-10-07 DIAGNOSIS — E1165 Type 2 diabetes mellitus with hyperglycemia: Secondary | ICD-10-CM | POA: Diagnosis not present

## 2015-10-07 DIAGNOSIS — E039 Hypothyroidism, unspecified: Secondary | ICD-10-CM | POA: Diagnosis not present

## 2015-10-08 DIAGNOSIS — D3702 Neoplasm of uncertain behavior of tongue: Secondary | ICD-10-CM | POA: Diagnosis not present

## 2015-10-09 DIAGNOSIS — R079 Chest pain, unspecified: Secondary | ICD-10-CM | POA: Diagnosis not present

## 2015-10-09 DIAGNOSIS — R0602 Shortness of breath: Secondary | ICD-10-CM | POA: Diagnosis not present

## 2015-10-22 DIAGNOSIS — C029 Malignant neoplasm of tongue, unspecified: Secondary | ICD-10-CM | POA: Diagnosis not present

## 2015-10-22 DIAGNOSIS — K148 Other diseases of tongue: Secondary | ICD-10-CM | POA: Diagnosis not present

## 2015-10-27 ENCOUNTER — Other Ambulatory Visit: Payer: Self-pay | Admitting: Unknown Physician Specialty

## 2015-10-27 DIAGNOSIS — K148 Other diseases of tongue: Secondary | ICD-10-CM

## 2015-10-27 DIAGNOSIS — C029 Malignant neoplasm of tongue, unspecified: Secondary | ICD-10-CM

## 2015-10-29 ENCOUNTER — Ambulatory Visit: Payer: Medicare Other | Admitting: Cardiology

## 2015-10-30 ENCOUNTER — Encounter: Payer: Self-pay | Admitting: Cardiology

## 2015-10-30 ENCOUNTER — Ambulatory Visit (INDEPENDENT_AMBULATORY_CARE_PROVIDER_SITE_OTHER): Payer: Medicare Other | Admitting: Cardiology

## 2015-10-30 VITALS — BP 140/100 | HR 75 | Ht 70.0 in | Wt 271.4 lb

## 2015-10-30 DIAGNOSIS — R079 Chest pain, unspecified: Secondary | ICD-10-CM | POA: Diagnosis not present

## 2015-10-30 DIAGNOSIS — E785 Hyperlipidemia, unspecified: Secondary | ICD-10-CM | POA: Diagnosis not present

## 2015-10-30 DIAGNOSIS — I10 Essential (primary) hypertension: Secondary | ICD-10-CM | POA: Diagnosis not present

## 2015-10-30 DIAGNOSIS — R0602 Shortness of breath: Secondary | ICD-10-CM

## 2015-10-30 MED ORDER — NITROGLYCERIN 0.4 MG SL SUBL: 0.4000 mg | SUBLINGUAL_TABLET | SUBLINGUAL | Status: DC | PRN

## 2015-10-30 NOTE — Patient Instructions (Addendum)
Medication Instructions:  Your physician has recommended you make the following change in your medication:  1. Nitroglycerin under the tongue as needed for chest pain. One tablet under the tongue every 5 minutes up to a total of 3. Please read materials printed below before taking first dose.    Labwork: None ordered  Testing/Procedures: Your physician has requested that you have an echocardiogram. Echocardiography is a painless test that uses sound waves to create images of your heart. It provides your doctor with information about the size and shape of your heart and how well your heart's chambers and valves are working. This procedure takes approximately one hour. There are no restrictions for this procedure.  [place]  Your caregiver has ordered a Stress Test with nuclear imaging. The purpose of this test is to evaluate the blood supply to your heart muscle. Cardiac stress tests are done to find areas of poor blood flow to the heart by determining the extent of coronary artery disease (CAD).    Please note: these test may take anywhere between 2-4 hours to complete  PLEASE REPORT TO [facility] AT THE FIRST DESK WILL DIRECT YOU WHERE TO GO  Date of Procedure:__Wednesday November 12, 2015 at 08:00AM__  Arrival Time for Procedure:_Arrive at 07:45AM to register__  Instructions regarding medication:   __X__ : Hold diabetes medication morning of procedure. Your Metformin and Glimepiride  __X__:  Hold the ziac (bisoprolol-hydrochlorothiazide) the night before procedure and morning of procedure    PLEASE NOTIFY THE OFFICE AT LEAST 24 HOURS IN ADVANCE IF YOU ARE UNABLE TO KEEP YOUR APPOINTMENT.  [phone] AND  PLEASE NOTIFY NUCLEAR MEDICINE AT [facility] AT LEAST 24 HOURS IN ADVANCE IF YOU ARE UNABLE TO KEEP YOUR APPOINTMENT. [phone]  How to prepare for your Myoview test:   Do not eat or drink after midnight  No caffeine for 24 hours prior to  test  No smoking 24 hours prior to test.  Your medication may be taken with water.  If your doctor stopped a medication because of this test, do not take that medication.  Ladies, please do not wear dresses.  Skirts or pants are appropriate. Please wear a short sleeve shirt.  No perfume, cologne or lotion.  Wear comfortable walking shoes. No heels!    Follow-Up: Your physician recommends that you schedule a follow-up appointment after testing with Dr. Yvone Neu.  It was a pleasure seeing you today here in the office. Please do not hesitate to give [place] a call back if you have any further questions. [place], BSN      Any Other Special Instructions Will Be Listed Below (If Applicable).     If you need a refill on your cardiac medications before your next appointment, please call your pharmacy.  Echocardiogram An echocardiogram, or echocardiography, uses sound waves (ultrasound) to produce an image of your heart. The echocardiogram is simple, painless, obtained within a short period of time, and offers valuable information to your health care provider. The images from an echocardiogram can provide information such as:  Evidence of coronary artery disease (CAD).  Heart size.  Heart muscle function.  Heart valve function.  Aneurysm detection.  Evidence of a past heart attack.  Fluid buildup around the heart.  Heart muscle thickening.  Assess heart valve function. LET [facility] CARE PROVIDER KNOW ABOUT:  Any allergies you have.  All medicines you are taking, including vitamins, herbs, eye drops, creams, and over-the-counter  medicines.  Previous problems you or members of your family have had with the use of anesthetics.  Any blood disorders you have.  Previous surgeries you have had.  Medical conditions you have.  Possibility of pregnancy, if this applies. BEFORE THE PROCEDURE  No special preparation is needed. Eat and drink normally.   PROCEDURE   In order to produce an image of your heart, gel will be applied to your chest and a wand-like tool (transducer) will be moved over your chest. The gel will help transmit the sound waves from the transducer. The sound waves will harmlessly bounce off your heart to allow the heart images to be captured in real-time motion. These images will then be recorded.  You may need an IV to receive a medicine that improves the quality of the pictures. AFTER THE PROCEDURE You may return to your normal schedule including diet, activities, and medicines, unless your health care provider tells you otherwise.   This information is not intended to replace advice given to you by your health care provider. Make sure you discuss any questions you have with your health care provider.   Document Released: 04/09/2000 Document Revised: 05/03/2014 Document Reviewed: 12/18/2012 Elsevier Interactive Patient Education 2016 [place].    Cardiac Nuclear Scanning A cardiac nuclear scan is used to check your heart for problems, such as the following:  A portion of the heart is not getting enough blood.  Part of the heart muscle has died, which happens with a heart attack.  The heart wall is not working normally.  In this test, a radioactive dye (tracer) is injected into your bloodstream. After the tracer has traveled to your heart, a scanning device is used to measure how much of the tracer is absorbed by or distributed to various areas of your heart. LET [facility] CARE PROVIDER KNOW ABOUT:  Any allergies you have.  All medicines you are taking, including vitamins, herbs, eye drops, creams, and over-the-counter medicines.  Previous problems you or members of your family have had with the use of anesthetics.  Any blood disorders you have.  Previous surgeries you have had.  Medical conditions you have.  RISKS AND COMPLICATIONS Generally, this is a safe procedure. However, as with any  procedure, problems can occur. Possible problems include:   Serious chest pain.  Rapid heartbeat.  Sensation of warmth in your chest. This usually passes quickly. BEFORE THE PROCEDURE Ask your health care provider about changing or stopping your regular medicines. PROCEDURE This procedure is usually done at a hospital and takes 2-4 hours.  An IV tube is inserted into one of your veins.  Your health care provider will inject a small amount of radioactive tracer through the tube.  You will then wait for 20-40 minutes while the tracer travels through your bloodstream.  You will lie down on an exam table so images of your heart can be taken. Images will be taken for about 15-20 minutes.  You will exercise on a treadmill or stationary bike. While you exercise, your heart activity will be monitored with an electrocardiogram (ECG), and your blood pressure will be checked.  If you are unable to exercise, you may be given a medicine to make your heart beat faster.  When blood flow to your heart has peaked, tracer will again be injected through the IV tube.  After 20-40 minutes, you will get back on the exam table and have more images taken of your heart.  When the procedure is over,  your IV tube will be removed. AFTER THE PROCEDURE  You will likely be able to leave shortly after the test. Unless your health care provider tells you otherwise, you may return to your normal schedule, including diet, activities, and medicines.  Make sure you find out how and when you will get your test results.   This information is not intended to replace advice given to you by your health care provider. Make sure you discuss any questions you have with your health care provider.   Document Released: 05/07/2004 Document Revised: 04/17/2013 Document Reviewed: 03/21/2013 Elsevier Interactive Patient Education 2016 Elsevier Inc.   Nitroglycerin sublingual tablets What is this medicine? NITROGLYCERIN (nye  troe GLI ser in) is a type of vasodilator. It relaxes blood vessels, increasing the blood and oxygen supply to your heart. This medicine is used to relieve chest pain caused by angina. It is also used to prevent chest pain before activities like climbing stairs, going outdoors in cold weather, or sexual activity. This medicine may be used for other purposes; ask your health care provider or pharmacist if you have questions. What should I tell my health care provider before I take this medicine? They need to know if you have any of these conditions: -anemia -head injury, recent stroke, or bleeding in the brain -liver disease -previous heart attack -an unusual or allergic reaction to nitroglycerin, other medicines, foods, dyes, or preservatives -pregnant or trying to get pregnant -breast-feeding How should I use this medicine? Take this medicine by mouth as needed. At the first sign of an angina attack (chest pain or tightness) place one tablet under your tongue. You can also take this medicine 5 to 10 minutes before an event likely to produce chest pain. Follow the directions on the prescription label. Let the tablet dissolve under the tongue. Do not swallow whole. Replace the dose if you accidentally swallow it. It will help if your mouth is not dry. Saliva around the tablet will help it to dissolve more quickly. Do not eat or drink, smoke or chew tobacco while a tablet is dissolving. If you are not better within 5 minutes after taking ONE dose of nitroglycerin, call 9-1-1 immediately to seek emergency medical care. Do not take more than 3 nitroglycerin tablets over 15 minutes. If you take this medicine often to relieve symptoms of angina, your doctor or health care professional may provide you with different instructions to manage your symptoms. If symptoms do not go away after following these instructions, it is important to call 9-1-1 immediately. Do not take more than 3 nitroglycerin tablets over 15  minutes. Talk to your pediatrician regarding the use of this medicine in children. Special care may be needed. Overdosage: If you think you have taken too much of this medicine contact a poison control center or emergency room at once. NOTE: This medicine is only for you. Do not share this medicine with others. What if I miss a dose? This does not apply. This medicine is only used as needed. What may interact with this medicine? Do not take this medicine with any of the following medications: -certain migraine medicines like ergotamine and dihydroergotamine (DHE) -medicines used to treat erectile dysfunction like sildenafil, tadalafil, and vardenafil -riociguat This medicine may also interact with the following medications: -alteplase -aspirin -heparin -medicines for high blood pressure -medicines for mental depression -other medicines used to treat angina -phenothiazines like chlorpromazine, mesoridazine, prochlorperazine, thioridazine This list may not describe all possible interactions. Give your health care provider  a list of all the medicines, herbs, non-prescription drugs, or dietary supplements you use. Also tell them if you smoke, drink alcohol, or use illegal drugs. Some items may interact with your medicine. What should I watch for while using this medicine? Tell your doctor or health care professional if you feel your medicine is no longer working. Keep this medicine with you at all times. Sit or lie down when you take your medicine to prevent falling if you feel dizzy or faint after using it. Try to remain calm. This will help you to feel better faster. If you feel dizzy, take several deep breaths and lie down with your feet propped up, or bend forward with your head resting between your knees. You may get drowsy or dizzy. Do not drive, use machinery, or do anything that needs mental alertness until you know how this drug affects you. Do not stand or sit up quickly, especially if  you are an older patient. This reduces the risk of dizzy or fainting spells. Alcohol can make you more drowsy and dizzy. Avoid alcoholic drinks. Do not treat yourself for coughs, colds, or pain while you are taking this medicine without asking your doctor or health care professional for advice. Some ingredients may increase your blood pressure. What side effects may I notice from receiving this medicine? Side effects that you should report to your doctor or health care professional as soon as possible: -blurred vision -dry mouth -skin rash -sweating -the feeling of extreme pressure in the head -unusually weak or tired Side effects that usually do not require medical attention (report to your doctor or health care professional if they continue or are bothersome): -flushing of the face or neck -headache -irregular heartbeat, palpitations -nausea, vomiting This list may not describe all possible side effects. Call your doctor for medical advice about side effects. You may report side effects to FDA at 1-800-FDA-1088. Where should I keep my medicine? Keep out of the reach of children. Store at room temperature between 20 and 25 degrees C (68 and 77 degrees F). Store in [facility] [employer] [facility]. Protect from light and moisture. Keep tightly closed. Throw away any unused medicine after the expiration date. NOTE: This sheet is a summary. It may not cover all possible information. If you have questions about this medicine, talk to your doctor, pharmacist, or health care provider.    2016, Elsevier/Gold Standard. (2013-02-08 17:57:36)

## 2015-10-30 NOTE — Progress Notes (Signed)
Cardiology Office Note   Date:  10/30/2015   ID:  Tony Lawrence, DOB 09-09-1945, MRN [id number]  Referring Doctor:  Lavera Guise, MD   Cardiologist:   Wende Bushy, MD   Reason for consultation:  No chief complaint on file.     History of Present Illness: Tony Lawrence is a 70 y.o. male who presents for chest pain, shortness of breath, abnormal stress test  Three-month history of jaw pain, and chest tightness. Symptoms were mild in intensity, lasting minutes at a time, randomly occurring, occurring with light activity/sexual intercourse. He also noticed some shortness of breath together with these symptoms. Symptoms mainly in the jaw/chest, nonradiating. Occurring at the most once a week. His PCP had him go for a stress test, it was a plain treadmill test. It was stopped due to development of symptoms. He recalls having only mild chest pain at the time and he said it could've gone on and continued with the treadmill protocol.  ROS:  Please see the history of present illness. Aside from mentioned under HPI, all other systems are reviewed and negative.     Past Medical History  Diagnosis Date  . Diabetes mellitus without complication ([place])   . Hyperlipidemia   . Hypertension   . Cancer ([facility])     Squamous cell CA of tongue  . OSA on CPAP   . Hypothyroidism     History reviewed. No pertinent past surgical history.   reports that he has never smoked. He does not have any smokeless tobacco history on file. He reports that he drinks alcohol. He reports that he does not use illicit drugs.   family history includes Emphysema in his father. No premature CAD in family  No outpatient prescriptions prior to visit.   No facility-administered medications prior to visit.     Allergies: Penicillins   Current outpatient prescriptions:  .  aspirin EC 81 MG tablet, Take 81 mg by mouth daily., Disp: , Rfl:  .  bisoprolol-hydrochlorothiazide (ZIAC) 2.5-6.25 MG tablet, Take 1  tablet by mouth daily., Disp: , Rfl:  .  ezetimibe-simvastatin (VYTORIN) 10-20 MG tablet, Take 1 tablet by mouth daily., Disp: , Rfl:  .  glimepiride (AMARYL) 2 MG tablet, Take 2 mg by mouth daily., Disp: , Rfl:  .  levothyroxine (SYNTHROID) 200 MCG tablet, Take 200 mcg by mouth daily., Disp: , Rfl:  .  liothyronine (CYTOMEL) 5 MCG tablet, Take 5 mcg by mouth daily., Disp: , Rfl:  .  metFORMIN (GLUCOPHAGE) 1000 MG tablet, Take 1,000 mg by mouth 2 (two) times daily., Disp: , Rfl:  .  VICTOZA 18 MG/3ML SOPN, Inject 12 mg into the skin daily., Disp: , Rfl: 3   PHYSICAL EXAM: VS:  BP 140/100 mmHg  Pulse 75  Ht 5\' 10"  (1.778 m)  Wt 271 lb 6.4 oz (123.106 kg)  BMI 38.94 kg/m2 , Body mass index is 38.94 kg/(m^2). Wt Readings from Last 3 Encounters:  10/30/15 271 lb 6.4 oz (123.106 kg)    GENERAL:  well developed, well nourished, obese, not in acute distress HEENT: normocephalic, pink conjunctivae, anicteric sclerae, no xanthelasma, normal dentition, oropharynx clear NECK:  no neck vein engorgement, JVP normal, no hepatojugular reflux, carotid upstroke brisk and symmetric, no bruit, no thyromegaly, no lymphadenopathy LUNGS:  good respiratory effort, clear to auscultation bilaterally CV:  PMI not displaced, no thrills, no lifts, S1 and S2 within normal limits, no palpable S3 or S4, no murmurs, no rubs, no gallops  ABD:  Soft, nontender, nondistended, normoactive bowel sounds, no abdominal aortic bruit, no hepatomegaly, no splenomegaly MS: nontender back, no kyphosis, no scoliosis, no joint deformities EXT:  2+ DP/PT pulses, no edema, no varicosities, no cyanosis, no clubbing SKIN: warm, nondiaphoretic, normal turgor, no ulcers NEUROPSYCH: alert, oriented to person, place, and time, sensory/motor grossly intact, normal mood, appropriate affect  Recent Labs: No results found for requested labs within last 365 days.   Lipid Panel No results found for: CHOL, TRIG, HDL, CHOLHDL, VLDL, LDLCALC,  LDLDIRECT   Other studies Reviewed:  EKG:  The ekg from 10/30/2015 was personally reviewed by me and it revealed sinus rhythm, 75 BPM, LAD, low voltage QRS.  Additional studies/ records that were reviewed personally reviewed by me today include: None available   ASSESSMENT AND PLAN: Jaw pain Chest pain Shortness of breath Risk factors for CAD include age, gender, hypertension, hyperlipidemia, diabetes.  Plain treadmill test c/o PCP 10/09/2015: Modified Bruce protocol 7.5 minutes Test was stopped mid of stage III because of tightness in chest (per pt it was 2/10) EKG showed borderline changes and possible ischemia/nonspecific ST-T wave changes.  Recommend further evaluation with nuclear exercise stress test, to switch to pharmacologic nuclear stress test if unable to reach target heart rate. Recommend echo as well. Patient to continue ASA, and prescribed NTG SL prn for chest pain. Patient instructed to call [phone] for unrelenting chest pain.  Hypertension Blood pressure has been usually normal in the [id number] systolic. Recommend BP log.  Hyperlipidemia LDL goal is less than 70 due to diabetes. PCP following labs.  Obesity Body mass index is 38.94 kg/(m^2).Marland Kitchen Recommend aggressive weight loss through diet and increased physical activity. Once cardiac workup is done.    Current medicines are reviewed at length with the patient today.  The patient does not have concerns regarding medicines.  Labs/ tests ordered today include: No orders of the defined types were placed in this encounter.    I had a lengthy and detailed discussion with the patient regarding diagnoses, prognosis, diagnostic options, treatment options , and side effects of medications.   I counseled the patient on importance of lifestyle modification including heart healthy diet, regular physical activity .   Disposition:   FU with undersigned after tests   Signed, Wende Bushy, MD  10/30/2015 4:09 PM    [place]  This note was generated in part with voice recognition software and I apologize for any typographical errors that were not detected and corrected.

## 2015-10-31 ENCOUNTER — Ambulatory Visit
Admission: RE | Admit: 2015-10-31 | Discharge: 2015-10-31 | Disposition: A | Discharge status: Home / Self Care | Payer: Medicare Other | Source: Ambulatory Visit | Attending: Unknown Physician Specialty | Admitting: Unknown Physician Specialty

## 2015-10-31 DIAGNOSIS — K760 Fatty (change of) liver, not elsewhere classified: Secondary | ICD-10-CM | POA: Diagnosis not present

## 2015-10-31 DIAGNOSIS — C029 Malignant neoplasm of tongue, unspecified: Secondary | ICD-10-CM | POA: Diagnosis not present

## 2015-10-31 DIAGNOSIS — D71 Functional disorders of polymorphonuclear neutrophils: Secondary | ICD-10-CM | POA: Diagnosis not present

## 2015-10-31 DIAGNOSIS — I700 Atherosclerosis of aorta: Secondary | ICD-10-CM | POA: Diagnosis not present

## 2015-10-31 DIAGNOSIS — K148 Other diseases of tongue: Secondary | ICD-10-CM

## 2015-10-31 LAB — GLUCOSE, CAPILLARY: Glucose-Capillary: 140 mg/dL — ABNORMAL HIGH (ref 65–99)

## 2015-10-31 MED ORDER — FLUDEOXYGLUCOSE F - 18 (FDG) INJECTION
13.2000 | Freq: Once | INTRAVENOUS | Status: AC | PRN
  Administered 2015-10-31: 13.2 via INTRAVENOUS

## 2015-11-03 ENCOUNTER — Telehealth: Payer: Self-pay | Admitting: Cardiology

## 2015-11-03 ENCOUNTER — Other Ambulatory Visit: Payer: Self-pay

## 2015-11-03 DIAGNOSIS — Z01818 Encounter for other preprocedural examination: Secondary | ICD-10-CM

## 2015-11-03 DIAGNOSIS — C029 Malignant neoplasm of tongue, unspecified: Secondary | ICD-10-CM | POA: Diagnosis not present

## 2015-11-03 NOTE — Telephone Encounter (Signed)
S/w Jacqlyn Larsen, RN for Dr. Anda Latina. Pt in office, needs procedure 7/18. Requests sooner myoview and echo. Treadmill myoview rescheduled 7/14, 8:30am.  Echo rescheduled 7/13, 11am at [facility]. Arrival 10:45am Pt agreeable w/plan. Clearance received and placed in MD box

## 2015-11-04 ENCOUNTER — Inpatient Hospital Stay: Payer: Medicare Other | Attending: Internal Medicine | Admitting: Internal Medicine

## 2015-11-04 ENCOUNTER — Inpatient Hospital Stay: Payer: Medicare Other

## 2015-11-04 ENCOUNTER — Encounter: Payer: Self-pay | Admitting: Internal Medicine

## 2015-11-04 VITALS — BP 142/82 | HR 65 | Temp 98.5°F | Resp 20 | Ht 70.0 in | Wt 271.2 lb

## 2015-11-04 DIAGNOSIS — R948 Abnormal results of function studies of other organs and systems: Secondary | ICD-10-CM | POA: Diagnosis not present

## 2015-11-04 DIAGNOSIS — C022 Malignant neoplasm of ventral surface of tongue: Secondary | ICD-10-CM | POA: Insufficient documentation

## 2015-11-04 DIAGNOSIS — I700 Atherosclerosis of aorta: Secondary | ICD-10-CM | POA: Insufficient documentation

## 2015-11-04 DIAGNOSIS — C021 Malignant neoplasm of border of tongue: Secondary | ICD-10-CM | POA: Insufficient documentation

## 2015-11-04 DIAGNOSIS — K760 Fatty (change of) liver, not elsewhere classified: Secondary | ICD-10-CM | POA: Diagnosis not present

## 2015-11-04 DIAGNOSIS — E119 Type 2 diabetes mellitus without complications: Secondary | ICD-10-CM | POA: Insufficient documentation

## 2015-11-04 DIAGNOSIS — Z7982 Long term (current) use of aspirin: Secondary | ICD-10-CM | POA: Diagnosis not present

## 2015-11-04 DIAGNOSIS — I10 Essential (primary) hypertension: Secondary | ICD-10-CM | POA: Diagnosis not present

## 2015-11-04 DIAGNOSIS — G4733 Obstructive sleep apnea (adult) (pediatric): Secondary | ICD-10-CM | POA: Insufficient documentation

## 2015-11-04 DIAGNOSIS — Z79899 Other long term (current) drug therapy: Secondary | ICD-10-CM | POA: Diagnosis not present

## 2015-11-04 DIAGNOSIS — Z7984 Long term (current) use of oral hypoglycemic drugs: Secondary | ICD-10-CM | POA: Insufficient documentation

## 2015-11-04 DIAGNOSIS — K5730 Diverticulosis of large intestine without perforation or abscess without bleeding: Secondary | ICD-10-CM | POA: Insufficient documentation

## 2015-11-04 DIAGNOSIS — E039 Hypothyroidism, unspecified: Secondary | ICD-10-CM | POA: Diagnosis not present

## 2015-11-04 DIAGNOSIS — E785 Hyperlipidemia, unspecified: Secondary | ICD-10-CM | POA: Insufficient documentation

## 2015-11-04 HISTORY — DX: Malignant neoplasm of border of tongue: C02.1

## 2015-11-04 HISTORY — DX: Malignant neoplasm of ventral surface of tongue: C02.2

## 2015-11-04 LAB — COMPREHENSIVE METABOLIC PANEL
ALBUMIN: 4.5 g/dL (ref 3.5–5.0)
ALK PHOS: 64 U/L (ref 38–126)
ALT: 59 U/L (ref 17–63)
AST: 48 U/L — ABNORMAL HIGH (ref 15–41)
Anion gap: 8 (ref 5–15)
BUN: 16 mg/dL (ref 6–20)
CALCIUM: 8.9 mg/dL (ref 8.9–10.3)
CHLORIDE: 99 mmol/L — AB (ref 101–111)
CO2: 30 mmol/L (ref 22–32)
CREATININE: 1.19 mg/dL (ref 0.61–1.24)
GFR calc non Af Amer: >60 mL/min (ref >60)
GLUCOSE: 217 mg/dL — AB (ref 65–99)
Potassium: 5.2 mmol/L — ABNORMAL HIGH (ref 3.5–5.1)
SODIUM: 137 mmol/L (ref 135–145)
Total Bilirubin: 1 mg/dL (ref 0.3–1.2)
Total Protein: 7.8 g/dL (ref 6.5–8.1)

## 2015-11-04 LAB — CBC WITH DIFFERENTIAL/PLATELET
BASOS ABS: 0.1 10*3/uL (ref 0–0.1)
BASOS PCT: 1 %
EOS ABS: 0.1 10*3/uL (ref 0–0.7)
EOS PCT: 2 %
HCT: 48.5 % (ref 40.0–52.0)
HEMOGLOBIN: 16.5 g/dL (ref 13.0–18.0)
LYMPHS ABS: 2.4 10*3/uL (ref 1.0–3.6)
Lymphocytes Relative: 29 %
MCH: 31.5 pg (ref 26.0–34.0)
MCHC: 33.9 g/dL (ref 32.0–36.0)
MCV: 93 fL (ref 80.0–100.0)
Monocytes Absolute: 0.7 10*3/uL (ref 0.2–1.0)
Monocytes Relative: 9 %
NEUTROS PCT: 59 %
Neutro Abs: 5.1 10*3/uL (ref 1.4–6.5)
PLATELETS: 161 10*3/uL (ref 150–440)
RBC: 5.22 MIL/uL (ref 4.40–5.90)
RDW: 13.8 % (ref 11.5–14.5)
WBC: 8.4 10*3/uL (ref 3.8–10.6)

## 2015-11-04 NOTE — Assessment & Plan Note (Addendum)
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 Approximately 2 cm in size; likely stage I. Discussed the pathology and stage with the patient family. PET scan negative for any cervical adenopathy.  For now I would agree with Dr. Tami Ribas- proceeding with surgical excision at this time. Based on final surgical pathology- adjuvant therapy- radiation Vs. chemoradiation would be recommended.   # PET uptake- in the rectal region. I reviewed the images myself. Patient will need colonoscopy/sigmoidoscopy for further evaluation; once above workup is done. Check CBC/CMP today.  # The workup plan of care was discussed with the patient and family in detail.  I reviewed the images myself and with the patient and family in detail. I also spoke to Dr. Tami Ribas re: above.   Thank you Dr.McQueen for allowing me to participate in the care of your pleasant patient. Please do not hesitate to contact me with questions or concerns in the interim.

## 2015-11-04 NOTE — Progress Notes (Signed)
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  male without a history of smoking or chewing tobacco; noted to have a ulcer on the side of the tongue many years ago; initial biopsy negative. More recently he noted to have mild soreness in that area; for which he was evaluated by a dentist; and then evaluated by ENT, Dr.McQueen. 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 He is currently awaiting cardiac evaluation; as preoperative evaluation for his oral surgery which is planned next week  Patient denies any lumps or bumps. Denies any difficulty swallowing. Denies any pain with swallowing.   Patient denies any blood in stools black stools. He had 2 colonoscopies in the past; most recent approximately 5 years ago. No nausea no vomiting. No change in bowel habits.   ROS: A complete 10 point review of system is done which is negative except mentioned above in history of present illness  MEDICAL HISTORY:  Past Medical History  Diagnosis Date  . Diabetes mellitus without complication ([place])   . Hyperlipidemia   . Hypertension   . Tongue cancer (HCC)     Squamous cell CA of tongue  . OSA on CPAP   . Hypothyroidism   . Arthritis   . History of shingles     SURGICAL HISTORY: Past Surgical History  Procedure Laterality Date  . 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 No smoking Social History   Social History  . Marital Status: Married    Spouse Name: N/A  . Number of Children: N/A  . Years of Education: N/A   Occupational History  . Not on file.   Social History Main Topics  . Smoking status: Never Smoker   . Smokeless tobacco: Not on file  . Alcohol Use: 0.0 oz/week    0 Standard drinks or equivalent per week     Comment: occassionally   . Drug Use: No  . Sexual Activity: Not on file   Other Topics Concern  . Not on file   Social History Narrative    FAMILY HISTORY: Family History  Problem Relation Age of Onset  . Emphysema Father   . Myelodysplastic syndrome Mother     ALLERGIES:  is allergic to penicillins.  MEDICATIONS:  Current Outpatient Prescriptions  Medication Sig Dispense Refill  . aspirin EC 81 MG tablet Take 81 mg by mouth daily.    . bisoprolol-hydrochlorothiazide (ZIAC) 2.5-6.25 MG tablet Take 1 tablet by mouth daily.    Marland Kitchen ezetimibe-simvastatin (VYTORIN) 10-20 MG tablet Take 1 tablet by mouth daily.    Marland Kitchen glimepiride (AMARYL) 2 MG tablet Take 2 mg by mouth daily.    Marland Kitchen levothyroxine (SYNTHROID) 200 MCG tablet Take 200 mcg by mouth daily.    Marland Kitchen liothyronine (CYTOMEL) 5 MCG tablet Take 5 mcg by mouth  daily.    . metFORMIN (GLUCOPHAGE) 1000 MG tablet Take 1,000 mg by mouth 2 (two) times daily.    . Multiple Vitamins-Minerals (CENTRUM SILVER 50+MEN PO) Take by mouth every morning.    . nitroGLYCERIN (NITROSTAT) 0.4 MG SL tablet Place 1 tablet (0.4 mg total) under the tongue every 5 (five) minutes as needed. 25 tablet 6  . VICTOZA 18 MG/3ML SOPN Inject 12 mg into the skin daily.  3   No current facility-administered medications for this visit.      Marland Kitchen  PHYSICAL EXAMINATION: ECOG PERFORMANCE STATUS: 0 - Asymptomatic  Filed Vitals:   11/04/15 0835  BP: 142/82  Pulse: 65  Temp: 98.5 F (36.9 C)  Resp: 20   Filed Weights   11/04/15 0835  Weight: 271 lb 2.7 oz (123 kg)     GENERAL: Well-nourished well-developed; Alert, no distress and comfortable.  with his wife.  EYES: no pallor or icterus OROPHARYNX: no thrush; ~2cm ulcerated lesion noted in left/lateral side of the tongue.  NECK: supple, no masses felt LYMPH:  no palpable lymphadenopathy in the cervical, axillary or inguinal regions LUNGS: clear to auscultation and  No wheeze or crackles HEART/CVS: regular rate & rhythm and no murmurs; No lower extremity edema ABDOMEN: abdomen soft, non-tender and normal bowel sounds Musculoskeletal:no cyanosis of digits and no clubbing  PSYCH: alert & oriented x 3 with fluent speech NEURO: no focal motor/sensory deficits SKIN:  no rashes or significant lesions  LABORATORY DATA:  I have reviewed the data as listed Lab Results  Component Value Date   WBC 8.4 11/04/2015   HGB 16.5 11/04/2015   HCT 48.5 11/04/2015   MCV 93.0 11/04/2015   PLT 161 11/04/2015    Recent Labs  11/04/15 0914  NA 137  K 5.2*  CL 99*  CO2 30  GLUCOSE 217*  BUN 16  CREATININE 1.19  CALCIUM 8.9  GFRNONAA >60  GFRAA >60  PROT 7.8  ALBUMIN 4.5  AST 48*  ALT 59  ALKPHOS 64  BILITOT 1.0    RADIOGRAPHIC STUDIES: I have personally reviewed the radiological images as listed and agreed with the findings in the report. Nm Pet Image Initial (pi) Skull Base To Thigh  10/31/2015  CLINICAL DATA:  Initial treatment strategy for tongue cancer post biopsy 2 weeks ago. EXAM: NUCLEAR MEDICINE PET SKULL BASE TO THIGH TECHNIQUE: 13.2 MCi F-18 FDG was injected intravenously. Full-ring PET imaging was performed from the skull base to thigh after the radiotracer. CT data was obtained and used for attenuation correction and anatomic localization. FASTING BLOOD GLUCOSE:  Value: 140 mg/dl COMPARISON:  None. FINDINGS: NECK No hypermetabolic cervical lymph nodes are identified.There are no lesions of the pharyngeal mucosal space. The base of tongue appears unremarkable. CHEST There are no hypermetabolic  mediastinal, hilar or axillary lymph nodes. There is no hypermetabolic pulmonary activity. Multiple calcified mediastinal and hilar lymph nodes are present. There is diffuse atherosclerosis of the coronary arteries and to a lesser degree the aorta and great vessels. ABDOMEN/PELVIS There is no hypermetabolic activity within the liver, adrenal glands, spleen or pancreas. There is no hypermetabolic nodal activity. There is mildly prominent focal activity within the mid rectum (SUV max 10.8). No obvious mucosal lesion is seen in this area on the CT images. Incidental findings include the presence of multiple calcified granulomas in the spleen, diffuse hepatic steatosis, right renal sinus cysts, colonic diverticulosis and aortoiliac atherosclerosis. SKELETON There is no hypermetabolic activity to suggest osseous metastatic disease. Lumbar spondylosis  noted. IMPRESSION: 1. There is no abnormal activity within the patient's tongue cancer or cervical lymph nodes. No evidence of metastatic disease. 2. Indeterminate activity in the rectum, potentially physiologic or inflammatory. If the patient has not had routine colonoscopy, further evaluation recommended. 3. Incidental findings as described, including aortic atherosclerosis, sequela of prior granulomatous disease and hepatic steatosis. Electronically Signed   By: Richardean Sale M.D.   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 Approximately 2 cm in size; likely stage I. Discussed the pathology and stage with the patient family. PET scan negative for any cervical adenopathy.  For now I would agree with Dr. Tami Ribas- proceeding with surgical excision at this time. Based on final surgical pathology- adjuvant therapy- radiation Vs. chemoradiation would be recommended.   # PET uptake- in the rectal region. I reviewed the images myself. Patient will need  colonoscopy/sigmoidoscopy for further evaluation; once above workup is done. Check CBC/CMP today.  # The workup plan of care was discussed with the patient and family in detail.  I reviewed the images myself and with the patient and family in detail. I also spoke to Dr. Tami Ribas re: above.   Thank you Dr.McQueen for allowing me to participate in the care of your pleasant patient. Please do not hesitate to contact me with questions or concerns in the interim.   We will also discuss the case at  tumor conference this week.   All questions were answered. The patient knows to call the clinic with any problems, questions or concerns.    Cammie Sickle, MD 11/04/2015 4:57 PM

## 2015-11-04 NOTE — Progress Notes (Signed)
Patient here today as new evaluation regarding tongue cancer.  Referred by Dr. Tami Ribas. Patient states he has had several episodes where his is SOB.  It has happened both when he is exerting himself and at rest.  States he is currently being worked up by a cardiologist.

## 2015-11-04 NOTE — Progress Notes (Signed)
New referral from Dr. Tami Ribas. Patient states that he presented to his local dentist, Dr. Eugenie Birks.  His dentist noticed an abnormal area on his tongue.  My tongue is currently not sore.  Pt reports h/o "well controlled-diabetes." He has lost 35 lbs for intentional weight loss.  He reports no other health concerns.   Per v/o Dr. Towana Badger patient's name to tumor conference list for clinical review.

## 2015-11-05 ENCOUNTER — Telehealth: Payer: Self-pay | Admitting: *Deleted

## 2015-11-05 NOTE — Telephone Encounter (Signed)
-----   Message from Cammie Sickle, MD sent at 11/04/2015  6:36 PM EDT ----- Please inform patient that- potassium just slightly above normal. Recommend increased fluid intake. Otherwise no new recommendations at this time. Follow up as planned after surgery.

## 2015-11-05 NOTE — Telephone Encounter (Signed)
Called patient to inform him that his K+ is slightly elevated.  MD recommends he increase his fluid intake.  Otherwise, no new recommendations.  Patient should follow up as planned after his surgery.

## 2015-11-06 ENCOUNTER — Telehealth: Payer: Self-pay | Admitting: Cardiology

## 2015-11-06 ENCOUNTER — Ambulatory Visit
Admission: RE | Admit: 2015-11-06 | Discharge: 2015-11-06 | Disposition: A | Discharge status: Home / Self Care | Payer: Medicare Other | Source: Ambulatory Visit | Attending: Cardiology | Admitting: Cardiology

## 2015-11-06 DIAGNOSIS — I351 Nonrheumatic aortic (valve) insufficiency: Secondary | ICD-10-CM | POA: Diagnosis not present

## 2015-11-06 DIAGNOSIS — I119 Hypertensive heart disease without heart failure: Secondary | ICD-10-CM | POA: Insufficient documentation

## 2015-11-06 DIAGNOSIS — Z01810 Encounter for preprocedural cardiovascular examination: Secondary | ICD-10-CM

## 2015-11-06 DIAGNOSIS — Z01818 Encounter for other preprocedural examination: Secondary | ICD-10-CM | POA: Diagnosis not present

## 2015-11-06 DIAGNOSIS — E119 Type 2 diabetes mellitus without complications: Secondary | ICD-10-CM | POA: Insufficient documentation

## 2015-11-06 DIAGNOSIS — I340 Nonrheumatic mitral (valve) insufficiency: Secondary | ICD-10-CM | POA: Diagnosis not present

## 2015-11-06 DIAGNOSIS — G4733 Obstructive sleep apnea (adult) (pediatric): Secondary | ICD-10-CM | POA: Diagnosis not present

## 2015-11-06 NOTE — Telephone Encounter (Signed)
Reviewed instructions for stress test scheduled for her husband tomorrow. She verbalized understanding and had no further questions at this time. Let her know to have her husband call if any questions.

## 2015-11-06 NOTE — Progress Notes (Signed)
*  PRELIMINARY RESULTS* Echocardiogram 2D Echocardiogram has been performed.  Sherrie Sport 11/06/2015, 11:27 AM

## 2015-11-07 ENCOUNTER — Other Ambulatory Visit: Payer: Self-pay

## 2015-11-07 ENCOUNTER — Telehealth: Payer: Self-pay | Admitting: Cardiovascular Disease

## 2015-11-07 ENCOUNTER — Other Ambulatory Visit: Payer: Self-pay | Admitting: Cardiovascular Disease

## 2015-11-07 ENCOUNTER — Telehealth: Payer: Self-pay | Admitting: Cardiology

## 2015-11-07 ENCOUNTER — Encounter
Admission: RE | Admit: 2015-11-07 | Discharge: 2015-11-07 | Disposition: A | Discharge status: Home / Self Care | Payer: Medicare Other | Source: Ambulatory Visit | Attending: Unknown Physician Specialty | Admitting: Unknown Physician Specialty

## 2015-11-07 ENCOUNTER — Encounter: Payer: Self-pay | Admitting: Cardiology

## 2015-11-07 ENCOUNTER — Encounter
Admission: RE | Admit: 2015-11-07 | Discharge: 2015-11-07 | Disposition: A | Discharge status: Home / Self Care | Payer: Medicare Other | Source: Ambulatory Visit | Attending: Cardiology | Admitting: Cardiology

## 2015-11-07 DIAGNOSIS — E785 Hyperlipidemia, unspecified: Secondary | ICD-10-CM | POA: Diagnosis not present

## 2015-11-07 DIAGNOSIS — I208 Other forms of angina pectoris: Secondary | ICD-10-CM

## 2015-11-07 DIAGNOSIS — R0602 Shortness of breath: Secondary | ICD-10-CM | POA: Diagnosis not present

## 2015-11-07 DIAGNOSIS — Z01812 Encounter for preprocedural laboratory examination: Secondary | ICD-10-CM

## 2015-11-07 DIAGNOSIS — I10 Essential (primary) hypertension: Secondary | ICD-10-CM | POA: Diagnosis not present

## 2015-11-07 DIAGNOSIS — R079 Chest pain, unspecified: Secondary | ICD-10-CM

## 2015-11-07 HISTORY — DX: Adverse effect of unspecified anesthetic, initial encounter: T41.45XA

## 2015-11-07 HISTORY — DX: Other complications of anesthesia, initial encounter: T88.59XA

## 2015-11-07 LAB — NM MYOCAR MULTI W/SPECT W/WALL MOTION / EF
CHL CUP NUCLEAR SDS: 10
CHL CUP RESTING HR STRESS: 60 {beats}/min
CHL CUP STRESS STAGE 2 GRADE: 0 %
CHL CUP STRESS STAGE 2 SPEED: 0 mph
CHL CUP STRESS STAGE 3 SPEED: 0 mph
CHL CUP STRESS STAGE 4 SPEED: 0 mph
CHL CUP STRESS STAGE 5 DBP: 61 mmHg
CHL CUP STRESS STAGE 5 GRADE: 0 %
CHL CUP STRESS STAGE 5 HR: 81 {beats}/min
CHL CUP STRESS STAGE 5 SBP: 152 mmHg
CHL CUP STRESS STAGE 5 SPEED: 0 mph
CHL CUP STRESS STAGE 6 DBP: 61 mmHg
CHL CUP STRESS STAGE 6 SBP: 152 mmHg
CHL CUP STRESS STAGE 6 SPEED: 0 mph
CSEPEW: 1 METS
CSEPPHR: 86 {beats}/min
CSEPPMHR: 57 %
LV dias vol: 139 mL (ref 62–150)
LV sys vol: 57 mL
Percent HR: 58 %
SRS: 15
SSS: 24
Stage 1 Grade: 0 %
Stage 1 HR: 78 {beats}/min
Stage 1 Speed: 0 mph
Stage 2 HR: 75 {beats}/min
Stage 3 Grade: 0 %
Stage 3 HR: 75 {beats}/min
Stage 4 Grade: 0 %
Stage 4 HR: 86 {beats}/min
Stage 6 Grade: 0 %
Stage 6 HR: 75 {beats}/min
TID: 0.92

## 2015-11-07 MED ORDER — REGADENOSON 0.4 MG/5ML IV SOLN
0.4000 mg | Freq: Once | INTRAVENOUS | Status: AC
  Administered 2015-11-07: 0.4 mg via INTRAVENOUS

## 2015-11-07 MED ORDER — TECHNETIUM TC 99M TETROFOSMIN IV KIT
31.1000 | PACK | Freq: Once | INTRAVENOUS | Status: AC | PRN
  Administered 2015-11-07: 31.1 via INTRAVENOUS

## 2015-11-07 MED ORDER — TECHNETIUM TC 99M TETROFOSMIN IV KIT
13.0000 | PACK | Freq: Once | INTRAVENOUS | Status: AC | PRN
  Administered 2015-11-07: 13.336 via INTRAVENOUS

## 2015-11-07 NOTE — Telephone Encounter (Signed)
Discussed findings of stress test with patient. Recommended to proceed with LHC for further evalaution. Discussed the nature of the procedure, benefits and risks (including but not limited to < 1% chance of major complications --- MI, CVA, death). Pt verbalized understanding and would like to proceed. He will be scheduled for [facility] on Mon 7/17th with Dr. Fletcher Anon.  Paged Dr. Tami Ribas to update him. Pt is scheduled for tongue surgery on 7/18th.

## 2015-11-07 NOTE — Telephone Encounter (Signed)
Per Dr. Fletcher Anon, pt needs left heart cath Monday, July 17, 9:30am for abnormal stress test. Dr. Yvone Neu informed patient and reviewed procedure. I left message for Pamala Hurry in scheduling and for the cath lab. S/w Jordana in [facility] to make aware and s/w John in Admissions/Registration. Lab orders and CXR order placed. Sent to pre-cert. Reviewed cath instructions w/pt who verbalized understanding. He is to arrive at the  [facility]  Monday, 7:45am for labs and CXR prior to procedure. Pt repeated back to me and verbalized understanding with no further questions

## 2015-11-07 NOTE — Progress Notes (Signed)
Cardiac clearance form placed on Christell Faith PA desk pending stress test results.

## 2015-11-07 NOTE — Patient Instructions (Signed)
Your procedure is scheduled [id number] 11/11/15 Report to Day Surgery. 2ND FLOOR MEDICAL MALL ENTRANCE To find out your arrival time please call [phone] between 1PM - 3PM on Monday 11/10/15.  Remember: Instructions that are not followed completely may result in serious medical risk, up to and including death, or upon the discretion of your surgeon and anesthesiologist your surgery may need to be rescheduled.    __X__ 1. Do not eat food or drink liquids after midnight. No gum chewing or hard candies.     __X__ 2. No Alcohol for 24 hours before or after surgery.   ____ 3. Bring all medications with you on the day of surgery if instructed.    __X__ 4. Notify your doctor if there is any change in your medical condition     (cold, fever, infections).     Do not wear jewelry, make-up, hairpins, clips or nail polish.  Do not wear lotions, powders, or perfumes.   Do not shave 48 hours prior to surgery. Men may shave face and neck.  Do not bring valuables to the hospital.    [facility] is not responsible for any belongings or valuables.               Contacts, dentures or bridgework may not be worn into surgery.  Leave your suitcase in the car. After surgery it may be brought to your room.  For patients admitted to the hospital, discharge time is determined by your                treatment team.   Patients discharged the day of surgery will not be allowed to drive home.   Please read over the following fact sheets that you were given:   Surgical Site Infection Prevention   __X__ Take these medicines the morning of surgery with A SIP OF WATER:    1. SYNTHROID  2.   3.   4.  5.  6.  ____ Fleet Enema (as directed)   ____ Use CHG Soap as directed  ____ Use inhalers on the day of surgery  __X__ Stop metformin 2 days prior to surgery    ____ Take 1/2 of usual insulin dose the night before surgery and none on the morning of surgery.   ____ Stop Coumadin/Plavix/aspirin on ASPIRIN  ALREADY ON HOLD  ____ Stop Anti-inflammatories on    ____ Stop supplements until after surgery.    __X__ Bring C-Pap to the hospital.

## 2015-11-09 ENCOUNTER — Other Ambulatory Visit: Payer: Self-pay | Admitting: Cardiovascular Disease

## 2015-11-10 ENCOUNTER — Telehealth: Payer: Self-pay | Admitting: Cardiology

## 2015-11-10 ENCOUNTER — Encounter: Payer: Self-pay | Admitting: *Deleted

## 2015-11-10 ENCOUNTER — Ambulatory Visit
Admission: RE | Admit: 2015-11-10 | Discharge: 2015-11-10 | Disposition: A | Discharge status: Home / Self Care | Payer: Medicare Other | Source: Ambulatory Visit | Attending: Cardiovascular Disease | Admitting: Cardiovascular Disease

## 2015-11-10 ENCOUNTER — Encounter
Admission: RE | Disposition: A | Discharge status: Home / Self Care | Payer: Self-pay | Source: Ambulatory Visit | Attending: Cardiovascular Disease

## 2015-11-10 DIAGNOSIS — E669 Obesity, unspecified: Secondary | ICD-10-CM | POA: Diagnosis not present

## 2015-11-10 DIAGNOSIS — I25118 Atherosclerotic heart disease of native coronary artery with other forms of angina pectoris: Secondary | ICD-10-CM | POA: Diagnosis not present

## 2015-11-10 DIAGNOSIS — I2510 Atherosclerotic heart disease of native coronary artery without angina pectoris: Secondary | ICD-10-CM | POA: Insufficient documentation

## 2015-11-10 DIAGNOSIS — Z6838 Body mass index (BMI) 38.0-38.9, adult: Secondary | ICD-10-CM | POA: Diagnosis not present

## 2015-11-10 DIAGNOSIS — Z79899 Other long term (current) drug therapy: Secondary | ICD-10-CM | POA: Diagnosis not present

## 2015-11-10 DIAGNOSIS — E119 Type 2 diabetes mellitus without complications: Secondary | ICD-10-CM | POA: Diagnosis not present

## 2015-11-10 DIAGNOSIS — Z7984 Long term (current) use of oral hypoglycemic drugs: Secondary | ICD-10-CM | POA: Diagnosis not present

## 2015-11-10 DIAGNOSIS — E785 Hyperlipidemia, unspecified: Secondary | ICD-10-CM | POA: Insufficient documentation

## 2015-11-10 DIAGNOSIS — Z825 Family history of asthma and other chronic lower respiratory diseases: Secondary | ICD-10-CM | POA: Insufficient documentation

## 2015-11-10 DIAGNOSIS — Z880 Allergy status to penicillin: Secondary | ICD-10-CM | POA: Diagnosis not present

## 2015-11-10 DIAGNOSIS — I10 Essential (primary) hypertension: Secondary | ICD-10-CM | POA: Insufficient documentation

## 2015-11-10 DIAGNOSIS — R079 Chest pain, unspecified: Secondary | ICD-10-CM | POA: Diagnosis present

## 2015-11-10 DIAGNOSIS — R9439 Abnormal result of other cardiovascular function study: Secondary | ICD-10-CM | POA: Insufficient documentation

## 2015-11-10 DIAGNOSIS — Z85810 Personal history of malignant neoplasm of tongue: Secondary | ICD-10-CM | POA: Insufficient documentation

## 2015-11-10 DIAGNOSIS — I208 Other forms of angina pectoris: Secondary | ICD-10-CM

## 2015-11-10 DIAGNOSIS — E039 Hypothyroidism, unspecified: Secondary | ICD-10-CM | POA: Insufficient documentation

## 2015-11-10 DIAGNOSIS — G4733 Obstructive sleep apnea (adult) (pediatric): Secondary | ICD-10-CM | POA: Diagnosis not present

## 2015-11-10 DIAGNOSIS — R0602 Shortness of breath: Secondary | ICD-10-CM | POA: Diagnosis not present

## 2015-11-10 DIAGNOSIS — Z7982 Long term (current) use of aspirin: Secondary | ICD-10-CM | POA: Insufficient documentation

## 2015-11-10 HISTORY — PX: CARDIAC CATHETERIZATION: SHX172

## 2015-11-10 LAB — GLUCOSE, CAPILLARY: Glucose-Capillary: 155 mg/dL — ABNORMAL HIGH (ref 65–99)

## 2015-11-10 SURGERY — LEFT HEART CATH AND CORONARY ANGIOGRAPHY
Anesthesia: Moderate Sedation

## 2015-11-10 MED ORDER — SODIUM CHLORIDE 0.9% FLUSH: 3.0000 mL | INTRAVENOUS | Status: DC | PRN

## 2015-11-10 MED ORDER — VERAPAMIL HCL 2.5 MG/ML IV SOLN
INTRAVENOUS | Status: DC | PRN
  Administered 2015-11-10: 2.5 mg via INTRAVENOUS

## 2015-11-10 MED ORDER — MIDAZOLAM HCL 2 MG/2ML IJ SOLN
INTRAMUSCULAR | Status: DC | PRN
  Administered 2015-11-10: 1 mg via INTRAVENOUS

## 2015-11-10 MED ORDER — HEPARIN SODIUM (PORCINE) 1000 UNIT/ML IJ SOLN
INTRAMUSCULAR | Status: AC
  Filled 2015-11-10: qty 1

## 2015-11-10 MED ORDER — MIDAZOLAM HCL 2 MG/2ML IJ SOLN
INTRAMUSCULAR | Status: AC
  Filled 2015-11-10: qty 2

## 2015-11-10 MED ORDER — SODIUM CHLORIDE 0.9 % WEIGHT BASED INFUSION: 1.0000 mL/kg/h | INTRAVENOUS | Status: DC

## 2015-11-10 MED ORDER — SODIUM CHLORIDE 0.9% FLUSH: 3.0000 mL | Freq: Two times a day (BID) | INTRAVENOUS | Status: DC

## 2015-11-10 MED ORDER — SODIUM CHLORIDE 0.9 % IV SOLN
INTRAVENOUS | Status: DC
  Administered 2015-11-10: 09:00:00 via INTRAVENOUS

## 2015-11-10 MED ORDER — ASPIRIN 81 MG PO CHEW
CHEWABLE_TABLET | ORAL | Status: AC
  Administered 2015-11-10: 81 mg
  Filled 2015-11-10: qty 1

## 2015-11-10 MED ORDER — VERAPAMIL HCL 2.5 MG/ML IV SOLN
INTRAVENOUS | Status: AC
  Filled 2015-11-10: qty 2

## 2015-11-10 MED ORDER — ASPIRIN 81 MG PO CHEW: 81.0000 mg | CHEWABLE_TABLET | ORAL | Status: AC

## 2015-11-10 MED ORDER — SODIUM CHLORIDE 0.9 % IV SOLN: 250.0000 mL | INTRAVENOUS | Status: DC | PRN

## 2015-11-10 MED ORDER — FENTANYL CITRATE (PF) 100 MCG/2ML IJ SOLN
INTRAMUSCULAR | Status: DC | PRN
  Administered 2015-11-10: 50 ug via INTRAVENOUS

## 2015-11-10 MED ORDER — HEPARIN (PORCINE) IN NACL 2-0.9 UNIT/ML-% IJ SOLN
INTRAMUSCULAR | Status: AC
  Filled 2015-11-10: qty 1000

## 2015-11-10 MED ORDER — FENTANYL CITRATE (PF) 100 MCG/2ML IJ SOLN
INTRAMUSCULAR | Status: AC
  Filled 2015-11-10: qty 2

## 2015-11-10 SURGICAL SUPPLY — 8 items
CATH INFINITI 5FR ANG PIGTAIL (CATHETERS) ×2 IMPLANT
CATH INFINITI JR4 5F (CATHETERS) ×2 IMPLANT
CATH OPTITORQUE JACKY 4.0 5F (CATHETERS) ×2 IMPLANT
DEVICE RAD COMP TR BAND LRG (VASCULAR PRODUCTS) ×2 IMPLANT
GLIDESHEATH SLEND SS 6F .021 (SHEATH) ×2 IMPLANT
KIT MANI 3VAL PERCEP (MISCELLANEOUS) ×2 IMPLANT
PACK CARDIAC CATH (CUSTOM PROCEDURE TRAY) ×2 IMPLANT
WIRE SAFE-T 1.5MM-J .035X260CM (WIRE) ×2 IMPLANT

## 2015-11-10 NOTE — Discharge Instructions (Signed)
Resume metformin after 2 days. Keep right wrist elevated on pillow above the heart for today.  Watch right wrist for evidence of bleeding or hematoma.. If bleeding or hematoma noted, hold pressure over the site for at least 15 minutes and notify the physician.  No blending or flexing of the wrist--no lifting for the remainder of the day or for 2 weeks after your procedure. Notify the physician for evidence of infection or if you get a temperature.

## 2015-11-10 NOTE — Telephone Encounter (Signed)
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 The occlusion appears to be long and diffuse with moderate calcifications. Thus, not optimal for PCI. I recommend medical therapy for coronary artery disease. I will consider adding long-acting nitroglycerin. The patient can proceed with tongue surgery at an overall moderate risk.  Informed Becky, [facility], of cath results. Routed note to her, [phone]

## 2015-11-10 NOTE — H&P (View-Only) (Signed)
Cardiology Office Note   Date:  10/30/2015   ID:  [name], DOB 07/13/1945, MRN [id number]  Referring Doctor:  Lavera Guise, MD   Cardiologist:   Wende Bushy, MD   Reason for consultation:  No chief complaint on file.     History of Present Illness: Tony Lawrence is a 70 y.o. male who presents for chest pain, shortness of breath, abnormal stress test  Three-month history of jaw pain, and chest tightness. Symptoms were mild in intensity, lasting minutes at a time, randomly occurring, occurring with light activity/sexual intercourse. He also noticed some shortness of breath together with these symptoms. Symptoms mainly in the jaw/chest, nonradiating. Occurring at the most once a week. His PCP had him go for a stress test, it was a plain treadmill test. It was stopped due to development of symptoms. He recalls having only mild chest pain at the time and he said it could've gone on and continued with the treadmill protocol.  ROS:  Please see the history of present illness. Aside from mentioned under HPI, all other systems are reviewed and negative.     Past Medical History  Diagnosis Date  . Diabetes mellitus without complication ([place])   . Hyperlipidemia   . Hypertension   . Cancer (HCC)     Squamous cell CA of tongue  . OSA on CPAP   . Hypothyroidism     History reviewed. No pertinent past surgical history.   reports that he has never smoked. He does not have any smokeless tobacco history on file. He reports that he drinks alcohol. He reports that he does not use illicit drugs.   family history includes Emphysema in his father. No premature CAD in family  No outpatient prescriptions prior to visit.   No facility-administered medications prior to visit.     Allergies: Penicillins   Current outpatient prescriptions:  .  aspirin EC 81 MG tablet, Take 81 mg by mouth daily., Disp: , Rfl:  .  bisoprolol-hydrochlorothiazide (ZIAC) 2.5-6.25 MG tablet, Take 1  tablet by mouth daily., Disp: , Rfl:  .  ezetimibe-simvastatin (VYTORIN) 10-20 MG tablet, Take 1 tablet by mouth daily., Disp: , Rfl:  .  glimepiride (AMARYL) 2 MG tablet, Take 2 mg by mouth daily., Disp: , Rfl:  .  levothyroxine (SYNTHROID) 200 MCG tablet, Take 200 mcg by mouth daily., Disp: , Rfl:  .  liothyronine (CYTOMEL) 5 MCG tablet, Take 5 mcg by mouth daily., Disp: , Rfl:  .  metFORMIN (GLUCOPHAGE) 1000 MG tablet, Take 1,000 mg by mouth 2 (two) times daily., Disp: , Rfl:  .  VICTOZA 18 MG/3ML SOPN, Inject 12 mg into the skin daily., Disp: , Rfl: 3   PHYSICAL EXAM: VS:  BP 140/100 mmHg  Pulse 75  Ht 5\' 10"  (1.778 m)  Wt 271 lb 6.4 oz (123.106 kg)  BMI 38.94 kg/m2 , Body mass index is 38.94 kg/(m^2). Wt Readings from Last 3 Encounters:  10/30/15 271 lb 6.4 oz (123.106 kg)    GENERAL:  well developed, well nourished, obese, not in acute distress HEENT: normocephalic, pink conjunctivae, anicteric sclerae, no xanthelasma, normal dentition, oropharynx clear NECK:  no neck vein engorgement, JVP normal, no hepatojugular reflux, carotid upstroke brisk and symmetric, no bruit, no thyromegaly, no lymphadenopathy LUNGS:  good respiratory effort, clear to auscultation bilaterally CV:  PMI not displaced, no thrills, no lifts, S1 and S2 within normal limits, no palpable S3 or S4, no murmurs, no rubs, no gallops  ABD:  Soft, nontender, nondistended, normoactive bowel sounds, no abdominal aortic bruit, no hepatomegaly, no splenomegaly MS: nontender back, no kyphosis, no scoliosis, no joint deformities EXT:  2+ DP/PT pulses, no edema, no varicosities, no cyanosis, no clubbing SKIN: warm, nondiaphoretic, normal turgor, no ulcers NEUROPSYCH: alert, oriented to person, place, and time, sensory/motor grossly intact, normal mood, appropriate affect  Recent Labs: No results found for requested labs within last 365 days.   Lipid Panel No results found for: CHOL, TRIG, HDL, CHOLHDL, VLDL, LDLCALC,  LDLDIRECT   Other studies Reviewed:  EKG:  The ekg from 10/30/2015 was personally reviewed by me and it revealed sinus rhythm, 75 BPM, LAD, low voltage QRS.  Additional studies/ records that were reviewed personally reviewed by me today include: None available   ASSESSMENT AND PLAN: Jaw pain Chest pain Shortness of breath Risk factors for CAD include age, gender, hypertension, hyperlipidemia, diabetes.  Plain treadmill test c/o PCP 10/09/2015: Modified Bruce protocol 7.5 minutes Test was stopped mid of stage III because of tightness in chest (per pt it was 2/10) EKG showed borderline changes and possible ischemia/nonspecific ST-T wave changes.  Recommend further evaluation with nuclear exercise stress test, to switch to pharmacologic nuclear stress test if unable to reach target heart rate. Recommend echo as well. Patient to continue ASA, and prescribed NTG SL prn for chest pain. Patient instructed to call 911 for unrelenting chest pain.  Hypertension Blood pressure has been usually normal in the [id number] systolic. Recommend BP log.  Hyperlipidemia LDL goal is less than 70 due to diabetes. PCP following labs.  Obesity Body mass index is 38.94 kg/(m^2).Marland Kitchen Recommend aggressive weight loss through diet and increased physical activity. Once cardiac workup is done.    Current medicines are reviewed at length with the patient today.  The patient does not have concerns regarding medicines.  Labs/ tests ordered today include: No orders of the defined types were placed in this encounter.    I had a lengthy and detailed discussion with the patient regarding diagnoses, prognosis, diagnostic options, treatment options , and side effects of medications.   I counseled the patient on importance of lifestyle modification including heart healthy diet, regular physical activity .   Disposition:   FU with undersigned after tests   Signed, Wende Bushy, MD  10/30/2015 4:09 PM    [place]  This note was generated in part with voice recognition software and I apologize for any typographical errors that were not detected and corrected.

## 2015-11-10 NOTE — Interval H&P Note (Signed)
Cath Lab Visit (complete for each Cath Lab visit)  Clinical Evaluation Leading to the Procedure:   ACS: No.  Non-ACS:    Anginal Classification: CCS III  Anti-ischemic medical therapy: Minimal Therapy (1 class of medications)  Non-Invasive Test Results: High-risk stress test findings: cardiac mortality >3%/year  Prior CABG: No previous CABG      History and Physical Interval Note:  11/10/2015 10:13 AM  Tony Lawrence  has presented today for surgery, with the diagnosis of chest pain with abnormal stress test.  The various methods of treatment have been discussed with the patient and family. After consideration of risks, benefits and other options for treatment, the patient has consented to  Procedure(s): Left Heart Cath and Coronary Angiography (N/A) as a surgical intervention .  The patient's history has been reviewed, patient examined, no change in status, stable for surgery.  I have reviewed the patient's chart and labs.  Questions were answered to the patient's satisfaction.     Kathlyn [place]

## 2015-11-10 NOTE — Telephone Encounter (Signed)
Informed Becky, [facility], that pt having cardiac cath today for abnormal myoview.

## 2015-11-10 NOTE — Telephone Encounter (Signed)
Becky with [facility] needs clearance for pt surgery tomorrow. Please call.

## 2015-11-11 ENCOUNTER — Ambulatory Visit: Payer: Medicare Other | Admitting: Anesthesiology

## 2015-11-11 ENCOUNTER — Encounter
Admission: RE | Disposition: A | Discharge status: Home / Self Care | Payer: Self-pay | Source: Ambulatory Visit | Attending: Unknown Physician Specialty

## 2015-11-11 ENCOUNTER — Encounter: Payer: Self-pay | Admitting: *Deleted

## 2015-11-11 ENCOUNTER — Ambulatory Visit
Admission: RE | Admit: 2015-11-11 | Discharge: 2015-11-11 | Disposition: A | Discharge status: Home / Self Care | Payer: Medicare Other | Source: Ambulatory Visit | Attending: Unknown Physician Specialty | Admitting: Unknown Physician Specialty

## 2015-11-11 DIAGNOSIS — C021 Malignant neoplasm of border of tongue: Secondary | ICD-10-CM | POA: Diagnosis not present

## 2015-11-11 DIAGNOSIS — E785 Hyperlipidemia, unspecified: Secondary | ICD-10-CM | POA: Diagnosis not present

## 2015-11-11 DIAGNOSIS — I10 Essential (primary) hypertension: Secondary | ICD-10-CM | POA: Diagnosis not present

## 2015-11-11 DIAGNOSIS — G4730 Sleep apnea, unspecified: Secondary | ICD-10-CM | POA: Insufficient documentation

## 2015-11-11 DIAGNOSIS — C029 Malignant neoplasm of tongue, unspecified: Secondary | ICD-10-CM | POA: Diagnosis not present

## 2015-11-11 DIAGNOSIS — G4733 Obstructive sleep apnea (adult) (pediatric): Secondary | ICD-10-CM | POA: Diagnosis not present

## 2015-11-11 DIAGNOSIS — G709 Myoneural disorder, unspecified: Secondary | ICD-10-CM | POA: Insufficient documentation

## 2015-11-11 DIAGNOSIS — C01 Malignant neoplasm of base of tongue: Secondary | ICD-10-CM | POA: Diagnosis present

## 2015-11-11 HISTORY — PX: EXCISION OF TONGUE LESION: SHX6434

## 2015-11-11 LAB — GLUCOSE, CAPILLARY
GLUCOSE-CAPILLARY: 147 mg/dL — AB (ref 65–99)
GLUCOSE-CAPILLARY: 182 mg/dL — AB (ref 65–99)

## 2015-11-11 LAB — POCT I-STAT 4, (NA,K, GLUC, HGB,HCT)
GLUCOSE: 171 mg/dL — AB (ref 65–99)
HEMATOCRIT: 50 % (ref 39.0–52.0)
HEMOGLOBIN: 17 g/dL (ref 13.0–17.0)
Potassium: 4.2 mmol/L (ref 3.5–5.1)
Sodium: 139 mmol/L (ref 135–145)

## 2015-11-11 SURGERY — EXCISION, LESION, TONGUE
Anesthesia: Choice

## 2015-11-11 MED ORDER — FENTANYL CITRATE (PF) 100 MCG/2ML IJ SOLN: 25.0000 ug | INTRAMUSCULAR | Status: DC | PRN

## 2015-11-11 MED ORDER — SUGAMMADEX SODIUM 200 MG/2ML IV SOLN
INTRAVENOUS | Status: DC | PRN
  Administered 2015-11-11: 245 mg via INTRAVENOUS

## 2015-11-11 MED ORDER — SODIUM CHLORIDE 0.9 % IV SOLN
INTRAVENOUS | Status: DC
  Administered 2015-11-11 (×2): via INTRAVENOUS

## 2015-11-11 MED ORDER — FAMOTIDINE 20 MG PO TABS
20.0000 mg | ORAL_TABLET | Freq: Once | ORAL | Status: AC
  Administered 2015-11-11: 20 mg via ORAL

## 2015-11-11 MED ORDER — ONDANSETRON HCL 4 MG/2ML IJ SOLN
INTRAMUSCULAR | Status: DC | PRN
  Administered 2015-11-11 (×2): 4 mg via INTRAVENOUS

## 2015-11-11 MED ORDER — LIDOCAINE VISCOUS 2 % MT SOLN: 20.0000 mL | OROMUCOSAL | Status: DC | PRN

## 2015-11-11 MED ORDER — PROPOFOL 10 MG/ML IV BOLUS
INTRAVENOUS | Status: DC | PRN
  Administered 2015-11-11: 200 mg via INTRAVENOUS

## 2015-11-11 MED ORDER — ROCURONIUM BROMIDE 100 MG/10ML IV SOLN
INTRAVENOUS | Status: DC | PRN
  Administered 2015-11-11: 10 mg via INTRAVENOUS
  Administered 2015-11-11: 30 mg via INTRAVENOUS
  Administered 2015-11-11: 10 mg via INTRAVENOUS
  Administered 2015-11-11: 20 mg via INTRAVENOUS

## 2015-11-11 MED ORDER — ONDANSETRON HCL 4 MG/2ML IJ SOLN: 4.0000 mg | Freq: Once | INTRAMUSCULAR | Status: DC | PRN

## 2015-11-11 MED ORDER — MIDAZOLAM HCL 2 MG/2ML IJ SOLN
INTRAMUSCULAR | Status: DC | PRN
  Administered 2015-11-11: 2 mg via INTRAVENOUS

## 2015-11-11 MED ORDER — SUCCINYLCHOLINE CHLORIDE 20 MG/ML IJ SOLN
INTRAMUSCULAR | Status: DC | PRN
  Administered 2015-11-11: 100 mg via INTRAVENOUS

## 2015-11-11 MED ORDER — BUPIVACAINE-EPINEPHRINE (PF) 0.5% -1:200000 IJ SOLN
INTRAMUSCULAR | Status: AC
  Filled 2015-11-11: qty 30

## 2015-11-11 MED ORDER — LIDOCAINE-EPINEPHRINE 1 %-1:100000 IJ SOLN
INTRAMUSCULAR | Status: AC
  Filled 2015-11-11: qty 1

## 2015-11-11 MED ORDER — FAMOTIDINE 20 MG PO TABS
ORAL_TABLET | ORAL | Status: AC
  Administered 2015-11-11: 20 mg via ORAL
  Filled 2015-11-11: qty 1

## 2015-11-11 MED ORDER — BUPIVACAINE-EPINEPHRINE 0.5% -1:200000 IJ SOLN
INTRAMUSCULAR | Status: DC | PRN
  Administered 2015-11-11: 6 mL
  Administered 2015-11-11: 2 mL

## 2015-11-11 MED ORDER — DEXAMETHASONE SODIUM PHOSPHATE 10 MG/ML IJ SOLN
INTRAMUSCULAR | Status: DC | PRN
  Administered 2015-11-11: 10 mg via INTRAVENOUS

## 2015-11-11 MED ORDER — FENTANYL CITRATE (PF) 100 MCG/2ML IJ SOLN
INTRAMUSCULAR | Status: DC | PRN
  Administered 2015-11-11: 100 ug via INTRAVENOUS
  Administered 2015-11-11: 50 ug via INTRAVENOUS

## 2015-11-11 SURGICAL SUPPLY — 20 items
APPLICATOR COTTON TIP 3IN (MISCELLANEOUS) ×6 IMPLANT
BLADE SURG 15 STRL LF DISP TIS (BLADE) ×1 IMPLANT
BLADE SURG 15 STRL SS (BLADE) ×1
CANISTER SUCT 1200ML W/VALVE (MISCELLANEOUS) ×2 IMPLANT
CNTNR SPEC 2.5X3XGRAD LEK (MISCELLANEOUS) ×6
COAG SUCT 10F 3.5MM HAND CTRL (MISCELLANEOUS) ×2 IMPLANT
COAGULATOR SUCT 8FR VV (MISCELLANEOUS) ×2 IMPLANT
CONT SPEC 4OZ STER OR WHT (MISCELLANEOUS) ×6
CONTAINER SPEC 2.5X3XGRAD LEK (MISCELLANEOUS) ×6 IMPLANT
ELECT CAUTERY BLADE 6.4 (BLADE) ×2 IMPLANT
ELECT CAUTERY NEEDLE 2.0 MIC (NEEDLE) ×2 IMPLANT
ELECT REM PT RETURN 9FT ADLT (ELECTROSURGICAL) ×2
ELECTRODE REM PT RTRN 9FT ADLT (ELECTROSURGICAL) ×1 IMPLANT
GLOVE BIO SURGEON STRL SZ7.5 (GLOVE) ×4 IMPLANT
GOWN STRL REUS W/ TWL LRG LVL3 (GOWN DISPOSABLE) ×2 IMPLANT
GOWN STRL REUS W/TWL LRG LVL3 (GOWN DISPOSABLE) ×2
NS IRRIG 500ML POUR BTL (IV SOLUTION) ×2 IMPLANT
PACK HEAD/NECK (MISCELLANEOUS) ×2 IMPLANT
SUT SILK 2 0 SH (SUTURE) ×4 IMPLANT
SUT VIC AB 4-0 RB1 18 (SUTURE) ×4 IMPLANT

## 2015-11-11 NOTE — H&P (Signed)
  H+P  Reviewed and will be scanned in later. No changes noted. 

## 2015-11-11 NOTE — Anesthesia Procedure Notes (Signed)
Procedure Name: Intubation Date/Time: 11/11/2015 10:36 AM Performed by: Jonna Clark Pre-anesthesia Checklist: Patient identified, Patient being monitored, Timeout performed, Emergency Drugs available and Suction available Patient Re-evaluated:Patient Re-evaluated prior to inductionOxygen Delivery Method: Circle system utilized Preoxygenation: Pre-oxygenation with 100% oxygen Intubation Type: IV induction Ventilation: Mask ventilation without difficulty Laryngoscope Size: Mac and 3 Grade View: Grade I Nasal Tubes: Nasal Rae, Nasal prep performed and Right Tube size: 6.5 mm Number of attempts: 1 Placement Confirmation: ETT inserted through vocal cords under direct vision,  positive ETCO2 and breath sounds checked- equal and bilateral Secured at: 21 cm Tube secured with: Tape Dental Injury: Teeth and Oropharynx as per pre-operative assessment  Comments: No magills needed to place ett. Able to push tube through cords.

## 2015-11-11 NOTE — Op Note (Signed)
11/11/2015  12:25 PM    Heese, Tony Lawrence  SW:[id number]   Pre-Op Dx: squamous cell carcinoma tongue  Post-op Dx: SAME  Proc: Excision left lateral posterior tongue lesion approximate 3 x 4 cm with frozen section   Surg:  Beverly Gust T  Anes:  GOT  EBL:  20 cc  Comp:  None  Findings:  Ulcerative fairly superficial lesion left lateral posterior tongue  Procedure: [name] was identified in the holding area taken the operating room placed in supine position. Nasoendotracheal intubation was performed. The table was then turned 90 and the patient was draped in the usual fashion for oral surgery. A molt MOLT retractor was placed within the oral cavity on the right. A 2-0 silk was used as retraction stitch to pull the tongue outward. This gave excellent access the posterior lateral aspect of the tongue which had a rarely superficial ulcerative type lesion. A local anesthetic of half percent bupivacaine with 1-200,000 units of epinephrine was used to inject around this area total of 4 cc was used. Short sharp scissors were then used to excise this entire ulcerative area which could be visualized. A 3-4 mm margin was taken along with this as well. With this lesion removed a stitch was placed anterior for marking. True margins were then harvested from the resulting wound and anterior inferior posterior superior and deep margin with the true margins inked. These were sent for frozen section. After review by pathology there was no evidence of invasive carcinoma in the true margins and only mild to moderate dysplasia. I did not feel that changing to chases mild is mild to moderate dysplasia was worthwhile at this point and elected to close. 4-0 Vicryl were then used to close in interrupted fashion. Approximately 2 more cc of the bupivacaine was injected for postoperative pain. The Molt retractor was then removed the patient was returned to anesthesia where he was expected in the operating room taken  recovery room in stable condition  Cultures: None   Specimens: Left lateral tongue lesion    Dispo:   Good   Plan:  Discharged home follow-up 1 week   [name] T  11/11/2015 12:25 PM

## 2015-11-11 NOTE — Transfer of Care (Signed)
Immediate Anesthesia Transfer of Care Note  Patient: Tony Lawrence  Procedure(s) Performed: Procedure(s): EXCISION OF TONGUE LESION/ WITH FROZEN SECTION (N/A)  Patient Location: PACU  Anesthesia Type:General  Level of Consciousness: sedated  Airway & Oxygen Therapy: Patient Spontanous Breathing and Patient connected to face mask oxygen  Post-op Assessment: Report given to RN and Post -op Vital signs reviewed and stable  Post vital signs: Reviewed and stable  Last Vitals:  Filed Vitals:   11/11/15 0917  Temp: 36.7 C    Last Pain:  Filed Vitals:   11/11/15 1000  PainSc: 0-No pain         Complications: No apparent anesthesia complications

## 2015-11-11 NOTE — Discharge Instructions (Signed)
General Anesthesia, Adult, Care After Refer to this sheet in the next few weeks. These instructions provide you with information on caring for yourself after your procedure. Your health care provider may also give you more specific instructions. Your treatment has been planned according to current medical practices, but problems sometimes occur. Call your health care provider if you have any problems or questions after your procedure. WHAT TO EXPECT AFTER THE PROCEDURE After the procedure, it is typical to experience:  Sleepiness.  Nausea and vomiting. HOME CARE INSTRUCTIONS  For the first 24 hours after general anesthesia:  Have a responsible person with you.  Do not drive a car. If you are alone, do not take public transportation.  Do not drink alcohol.  Do not take medicine that has not been prescribed by your health care provider.  Do not sign important papers or make important decisions.  You may resume a normal diet and activities as directed by your health care provider.  Change bandages (dressings) as directed.  If you have questions or problems that seem related to general anesthesia, call the hospital and ask for the anesthetist or anesthesiologist on call. SEEK MEDICAL CARE IF:  You have nausea and vomiting that continue the day after anesthesia.  You develop a rash. SEEK IMMEDIATE MEDICAL CARE IF:   You have difficulty breathing.  You have chest pain.  You have any allergic problems.   This information is not intended to replace advice given to you by your health care provider. Make sure you discuss any questions you have with your health care provider.   Document Released: 07/19/2000 Document Revised: 05/03/2014 Document Reviewed: 08/11/2011 Elsevier Interactive Patient Education 2016 Else[employer]  Excision of Skin Lesions, Care After Refer to this sheet in the next few weeks. These instructions provide you with information about caring for yourself after  your procedure. Your health care provider may also give you more specific instructions. Your treatment has been planned according to current medical practices, but problems sometimes occur. Call your health care provider if you have any problems or questions after your procedure. WHAT TO EXPECT AFTER THE PROCEDURE After your procedure, it is common to have pain or discomfort at the excision site. HOME CARE INSTRUCTIONS  Take over-the-counter and prescription medicines only as told by your health care provider.  Follow instructions from your health care provider about:  How to take care of your excision site. You should keep the site clean, dry, and protected for at least 48 hours.  When and how you should change your bandage (dressing).  When you should remove your dressing.  Removing whatever was used to close your excision site.  Check the excision area every day for signs of infection. Watch for:  Redness, swelling, or pain.  Fluid, blood, or pus.  For bleeding, apply gentle but firm pressure to the area using a folded towel for 20 minutes.  Avoid high-impact exercise and activities until the stitches (sutures) are removed or the area heals.  Follow instructions from your health care provider about how to minimize scarring. Avoid sun exposure until the area has healed. Scarring should lessen over time.  Keep all follow-up visits as told by your health care provider. This is important. SEEK MEDICAL CARE IF:  You have a fever.  You have redness, swelling, or pain at the excision site.  You have fluid, blood, or pus coming from the excision site.  You have ongoing bleeding at the excision site.  You have pain  that does not improve in 2-3 days after your procedure.  You notice skin irregularities or changes in sensation.   This information is not intended to replace advice given to you by your health care provider. Make sure you discuss any questions you have with your  health care provider.   Document Released: 08/27/2014 Document Reviewed: 08/27/2014 Elsevier Interactive Patient Education [employer].

## 2015-11-12 ENCOUNTER — Other Ambulatory Visit: Payer: Medicare Other

## 2015-11-12 NOTE — Anesthesia Preprocedure Evaluation (Signed)
Anesthesia Evaluation    History of Anesthesia Complications (+) history of anesthetic complications  Airway Mallampati: IV       Dental  (+) Teeth Intact   Pulmonary sleep apnea and Continuous Positive Airway Pressure Ventilation ,           Cardiovascular hypertension,  Rhythm:regular Rate:Normal     Neuro/Psych  Neuromuscular disease    GI/Hepatic   Endo/Other  diabetesHypothyroidism   Renal/GU      Musculoskeletal   Abdominal   Peds  Hematology   Anesthesia Other Findings   Reproductive/Obstetrics                             Anesthesia Physical Anesthesia Plan  ASA: III  Anesthesia Plan: Cricoid Pressure   Post-op Pain Management:    Induction:   Airway Management Planned:   Additional Equipment:   Intra-op Plan:   Post-operative Plan:   Informed Consent:   Plan Discussed with:   Anesthesia Plan Comments: (Nasotracheal intubation planned)        Anesthesia Quick Evaluation

## 2015-11-12 NOTE — Anesthesia Postprocedure Evaluation (Signed)
Anesthesia Post Note  Patient: Tony Lawrence  Procedure(s) Performed: Procedure(s) (LRB): EXCISION OF TONGUE LESION/ WITH FROZEN SECTION (N/A)  Patient location during evaluation: PACU Anesthesia Type: General Level of consciousness: awake and alert Pain management: pain level controlled Vital Signs Assessment: post-procedure vital signs reviewed and stable Respiratory status: spontaneous breathing, nonlabored ventilation, respiratory function stable and patient connected to nasal cannula oxygen Cardiovascular status: blood pressure returned to baseline and stable Postop Assessment: no signs of nausea or vomiting Anesthetic complications: no    Last Vitals:  Filed Vitals:   11/11/15 1336 11/11/15 1358  BP: 156/69 158/66  Pulse: 69 56  Temp: 36.4 C   Resp: 12 12    Last Pain:  Filed Vitals:   11/12/15 0832  PainSc: 2                  Molli Barrows

## 2015-11-13 LAB — SURGICAL PATHOLOGY

## 2015-11-19 ENCOUNTER — Other Ambulatory Visit: Payer: Medicare Other

## 2015-11-21 ENCOUNTER — Inpatient Hospital Stay (HOSPITAL_BASED_OUTPATIENT_CLINIC_OR_DEPARTMENT_OTHER): Payer: Medicare Other | Admitting: Internal Medicine

## 2015-11-21 VITALS — BP 135/81 | HR 73 | Temp 98.2°F | Resp 18 | Wt 269.5 lb

## 2015-11-21 DIAGNOSIS — Z79899 Other long term (current) drug therapy: Secondary | ICD-10-CM | POA: Diagnosis not present

## 2015-11-21 DIAGNOSIS — C021 Malignant neoplasm of border of tongue: Secondary | ICD-10-CM | POA: Diagnosis not present

## 2015-11-21 DIAGNOSIS — R948 Abnormal results of function studies of other organs and systems: Secondary | ICD-10-CM

## 2015-11-21 DIAGNOSIS — I10 Essential (primary) hypertension: Secondary | ICD-10-CM | POA: Diagnosis not present

## 2015-11-21 DIAGNOSIS — E785 Hyperlipidemia, unspecified: Secondary | ICD-10-CM | POA: Diagnosis not present

## 2015-11-21 DIAGNOSIS — E119 Type 2 diabetes mellitus without complications: Secondary | ICD-10-CM | POA: Diagnosis not present

## 2015-11-21 NOTE — Progress Notes (Signed)
Patient is here for follow up, had surgery on his tongue so not able to eat solid foods. No complaints today

## 2015-11-21 NOTE — Progress Notes (Signed)
[facility] NOTE  Patient Care Team: Lavera Guise, MD as PCP - General (Internal Medicine)  CHIEF COMPLAINTS/PURPOSE OF CONSULTATION:  Oncology History   # June 2016- SQUAMOUS CELL CA of lateral left tongue [s/p Bx- Dr.McQueen] s/p Excision T [1.1cm]; clear margins; NO LVI/PNI. STAGE I- no adjuvant threapy.  # July 2017- Rectal uptake on PET- recommend colonoscopy.      Cancer of ventral surface of tongue ([place])   11/04/2015 Initial Diagnosis    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)      Malignant neoplasm of tongue, tip and lateral border ([place])   11/04/2015 Initial Diagnosis    Malignant neoplasm of tongue, tip and lateral border (HCC)       HISTORY OF PRESENTING ILLNESS:  Tony Lawrence 70 y.o.  male left-sided lateral tongue cancer-status post surgical excision of his tongue cancer is here to review the final patholog/ next plan of care.  Postoperative recovery is uneventful. Denies any significant pain.   ROS: A complete 10 point review of system is done which is negative except mentioned above in history of present illness  MEDICAL HISTORY:  Past Medical History:  Diagnosis Date  . Arthritis   . Complication of anesthesia    SEVERE SORE THROAT AFTER BIOPSY 2010  . Diabetes mellitus without complication ([place])   . History of shingles   . Hyperlipidemia   . Hypertension   . Hypothyroidism   . OSA on CPAP   . Tongue cancer ([place])    Squamous cell CA of tongue    SURGICAL HISTORY: Past Surgical History:  Procedure Laterality Date  . BIOPSY TONGUE    . CARDIAC CATHETERIZATION N/A 11/10/2015   Procedure: Left Heart Cath and Coronary Angiography;  Surgeon: Wellington Hampshire, MD;  Location: [facility];  Service: Cardiovascular;  Laterality: N/A;  . COLONOSCOPY  05/2006  . 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 No smoking Social History   Social History  . Marital status: Married    Spouse name: N/A  . Number of children: N/A  . Years of education: N/A   Occupational History  . Not on file.   Social History Main Topics  . Smoking status: Never Smoker  . Smokeless tobacco: Never Used  . Alcohol use 0.0 oz/week     Comment: occassionally   . Drug use: No  . Sexual activity: Not on file   Other Topics Concern  . Not on file   Social History Narrative  . No narrative on file    FAMILY HISTORY: Family History  Problem Relation Age of Onset  . Emphysema Father   . Myelodysplastic syndrome Mother     ALLERGIES:  is allergic to penicillins.  MEDICATIONS:  Current Outpatient Prescriptions  Medication Sig Dispense Refill  . aspirin 81 MG tablet Take 81 mg by mouth daily.    . bisoprolol-hydrochlorothiazide (ZIAC) 2.5-6.25 MG tablet Take 1 tablet by mouth daily.    Marland Kitchen ezetimibe-simvastatin (VYTORIN) 10-20 MG tablet Take 0.5 tablets by mouth daily at 6 PM.     . glimepiride (AMARYL) 2 MG tablet Take 1 mg by mouth 2 (two) times daily.     Marland Kitchen levothyroxine (SYNTHROID) 200 MCG tablet Take 200 mcg by mouth daily.    Marland Kitchen  liothyronine (CYTOMEL) 5 MCG tablet Take 5 mcg by mouth every other day.     . metFORMIN (GLUCOPHAGE) 1000 MG tablet Take 1,000 mg by mouth 2 (two) times daily.    . Multiple Vitamins-Minerals (CENTRUM SILVER 50+MEN PO) Take 1 tablet by mouth every morning.     Marland Kitchen VICTOZA 18 MG/3ML SOPN Inject 1.2 mg into the skin daily.   3  . lidocaine (XYLOCAINE) 2 % solution Use as directed 20 mLs in the mouth or throat as needed for mouth pain. (Patient not taking: Reported on 11/21/2015) 100 mL 10  . nitroGLYCERIN (NITROSTAT) 0.4 MG SL tablet Place 1 tablet (0.4 mg total) under the tongue every 5 (five) minutes as needed. (Patient not taking: Reported on 11/10/2015) 25 tablet 6   No current  facility-administered medications for this visit.       Marland Kitchen  PHYSICAL EXAMINATION: ECOG PERFORMANCE STATUS: 0 - Asymptomatic  Vitals:   11/21/15 1107  BP: 135/81  Pulse: 73  Resp: 18  Temp: 98.2 F (36.8 C)   Filed Weights   11/21/15 1107  Weight: 269 lb 8 oz (122.2 kg)    GENERAL: Well-nourished well-developed; Alert, no distress and comfortable.  He is alone.  EYES: no pallor or icterus OROPHARYNX: no thrush; resected area noted in left/lateral side of the tongue.  NECK: supple, no masses felt LYMPH:  no palpable lymphadenopathy in the cervical, axillary or inguinal regions LUNGS: clear to auscultation and  No wheeze or crackles HEART/CVS: regular rate & rhythm and no murmurs; No lower extremity edema ABDOMEN: abdomen soft, non-tender and normal bowel sounds Musculoskeletal:no cyanosis of digits and no clubbing  PSYCH: alert & oriented x 3 with fluent speech NEURO: no focal motor/sensory deficits SKIN:  no rashes or significant lesions  LABORATORY DATA:  I have reviewed the data as listed Lab Results  Component Value Date   WBC 8.4 11/04/2015   HGB 17.0 11/11/2015   HCT 50.0 11/11/2015   MCV 93.0 11/04/2015   PLT 161 11/04/2015    Recent Labs  11/04/15 0914 11/11/15 0930  NA 137 139  K 5.2* 4.2  CL 99*  --   CO2 30  --   GLUCOSE 217* 171*  BUN 16  --   CREATININE 1.19  --   CALCIUM 8.9  --   GFRNONAA >60  --   GFRAA >60  --   PROT 7.8  --   ALBUMIN 4.5  --   AST 48*  --   ALT 59  --   ALKPHOS 64  --   BILITOT 1.0  --     RADIOGRAPHIC STUDIES: I have personally reviewed the radiological images as listed and agreed with the findings in the report. Nm Myocar Multi W/spect W/wall Motion / Ef  Result Date: 11/07/2015  Blood pressure demonstrated a hypertensive response to exercise.  Upsloping ST segment depression ST segment depression was noted during stress in the II, III, aVF, V3, V4, V5 and V6 leads. The test was switched to [place].  Defect  1: There is a large defect of severe severity present in the mid anterior, mid anteroseptal, apical anterior, apical septal and apex location.  Findings consistent with LAD ischemia.  This is a high risk study.  The left ventricular ejection fraction is moderately decreased (30-44%).    Nm Pet Image Initial (pi) Skull Base To Thigh  Result Date: 10/31/2015 CLINICAL DATA:  Initial treatment strategy for tongue cancer post biopsy 2 weeks ago. EXAM: NUCLEAR MEDICINE  PET SKULL BASE TO THIGH TECHNIQUE: 13.2 MCi F-18 FDG was injected intravenously. Full-ring PET imaging was performed from the skull base to thigh after the radiotracer. CT data was obtained and used for attenuation correction and anatomic localization. FASTING BLOOD GLUCOSE:  Value: 140 mg/dl COMPARISON:  None. FINDINGS: NECK No hypermetabolic cervical lymph nodes are identified.There are no lesions of the pharyngeal mucosal space. The base of tongue appears unremarkable. CHEST There are no hypermetabolic mediastinal, hilar or axillary lymph nodes. There is no hypermetabolic pulmonary activity. Multiple calcified mediastinal and hilar lymph nodes are present. There is diffuse atherosclerosis of the coronary arteries and to a lesser degree the aorta and great vessels. ABDOMEN/PELVIS There is no hypermetabolic activity within the liver, adrenal glands, spleen or pancreas. There is no hypermetabolic nodal activity. There is mildly prominent focal activity within the mid rectum (SUV max 10.8). No obvious mucosal lesion is seen in this area on the CT images. Incidental findings include the presence of multiple calcified granulomas in the spleen, diffuse hepatic steatosis, right renal sinus cysts, colonic diverticulosis and aortoiliac atherosclerosis. SKELETON There is no hypermetabolic activity to suggest osseous metastatic disease. Lumbar spondylosis noted. IMPRESSION: 1. There is no abnormal activity within the patient's tongue cancer or cervical lymph  nodes. No evidence of metastatic disease. 2. Indeterminate activity in the rectum, potentially physiologic or inflammatory. If the patient has not had routine colonoscopy, further evaluation recommended. 3. Incidental findings as described, including aortic atherosclerosis, sequela of prior granulomatous disease and hepatic steatosis. Electronically Signed   By: Richardean Sale M.D.   On: 10/31/2015 12:34    ASSESSMENT & PLAN:  Malignant neoplasm of tongue, tip and lateral border (HCC) Squamous cell carcinoma of the tongue left side; status post excision 3 mm deep; clear margins. No evidence of lymphovascular invasion/perineural invasion- no adverse features noted on the pathology. Would not recommend adjuvant chemotherapy or radiation.   # Recommend follow-up with Dr. Tami Ribas as planned   # PET uptake- in the rectal region- recommend follow up with PCP/Dr.Fozia Humphrey Rolls for GI referral.-   # recommend follow-up with Dr. Tami Ribas; no further follow-ups in the clinic with [place]- unless needed in the future. Patient agrees with the plan. We'll talk to Dr. Tami Ribas.  We will also discuss the case at  tumor conference next week.   All questions were answered. The patient knows to call the clinic with any problems, questions or concerns.    Cammie Sickle, MD 11/21/2015 5:44 PM

## 2015-11-21 NOTE — Assessment & Plan Note (Signed)
Squamous cell carcinoma of the tongue left side; status post excision 3 mm deep; clear margins. No evidence of lymphovascular invasion/perineural invasion- no adverse features noted on the pathology. Would not recommend adjuvant chemotherapy or radiation.   # Recommend follow-up with Dr. Tami Ribas as planned   # PET uptake- in the rectal region- recommend follow up with PCP/Dr.Fozia Humphrey Rolls for GI referral.-   # recommend follow-up with Dr. Tami Ribas; no further follow-ups in the clinic with [place]- unless needed in the future. Patient agrees with the plan. We'll talk to Dr. Tami Ribas.

## 2015-11-26 ENCOUNTER — Encounter: Payer: Self-pay | Admitting: Cardiology

## 2015-11-26 ENCOUNTER — Ambulatory Visit (INDEPENDENT_AMBULATORY_CARE_PROVIDER_SITE_OTHER): Payer: Medicare Other | Admitting: Cardiology

## 2015-11-26 VITALS — BP 136/80 | HR 66 | Ht 70.0 in | Wt 264.8 lb

## 2015-11-26 DIAGNOSIS — I10 Essential (primary) hypertension: Secondary | ICD-10-CM | POA: Diagnosis not present

## 2015-11-26 DIAGNOSIS — E785 Hyperlipidemia, unspecified: Secondary | ICD-10-CM | POA: Diagnosis not present

## 2015-11-26 DIAGNOSIS — I2510 Atherosclerotic heart disease of native coronary artery without angina pectoris: Secondary | ICD-10-CM

## 2015-11-26 DIAGNOSIS — I208 Other forms of angina pectoris: Secondary | ICD-10-CM

## 2015-11-26 MED ORDER — ISOSORBIDE MONONITRATE ER 30 MG PO TB24: 30.0000 mg | ORAL_TABLET | Freq: Every day | ORAL | 6 refills | Status: DC

## 2015-11-26 NOTE — Patient Instructions (Signed)
Medication Instructions:  Your physician has recommended you make the following change in your medication:  1. Start Imdur 30 mg once daily at bedtime. May take tylenol with this.  Follow-Up: Your physician recommends that you schedule a follow-up appointment in: 1 month with Dr. Yvone Neu.   It was a pleasure seeing you today here in the office. Please do not hesitate to give [place] a call back if you have any further questions. [place], BSN

## 2015-11-26 NOTE — Progress Notes (Signed)
Cardiology Office Note   Date:  11/26/2015   ID:  Tony Lawrence, DOB [birth year], MRN [id number]  Referring Doctor:  Lavera Guise, MD   Cardiologist:   Wende Bushy, MD   Reason for consultation:  Chief Complaint  Patient presents with  . Other    follow up from Echo and Myoview. "doing well."       History of Present Illness: Tony Lawrence is a 70 y.o. male who presents for follow-up after tests  Patient was found to have an abnormal nuclear stress test. Ended up with left heart catheterization. This revealed significant LAD disease that is chronically occluded not amenable to PCI.  He is here to talk about optimized medical therapy. Currently has no chest pain or shortness of breath. He has limited his physical activity.  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 He was told that it was stage I and that he is cleared.  ROS:  Please see the history of present illness. Aside from mentioned under HPI, all other systems are reviewed and negative.     Past Medical History:  Diagnosis Date  . Arthritis   . Complication of anesthesia    SEVERE SORE THROAT AFTER BIOPSY 2010  . Diabetes mellitus without complication ([place])   . History of shingles   . Hyperlipidemia   . Hypertension   . Hypothyroidism   . OSA on CPAP   . Tongue cancer ([place])    Squamous cell CA of tongue    Past Surgical History:  Procedure Laterality Date  . BIOPSY TONGUE    . CARDIAC CATHETERIZATION N/A 11/10/2015   Procedure: Left Heart Cath and Coronary Angiography;  Surgeon: Wellington Hampshire, MD;  Location: [place] [facility];  Service: Cardiovascular;  Laterality: N/A;  . COLONOSCOPY  05/2006  . EXCISION OF TONGUE LESION N/A 11/11/2015   Procedure: EXCISION OF TONGUE LESION/ WITH FROZEN SECTION;  Surgeon: Beverly Gust, MD;  Location: [facility];  Service: ENT;  Laterality: N/A;     reports that he has never smoked. He has never used smokeless tobacco. He reports  that he drinks alcohol. He reports that he does not use drugs.   family history includes Emphysema in his father; Myelodysplastic syndrome in his mother. No premature CAD in family  Outpatient Medications Prior to Visit  Medication Sig Dispense Refill  . aspirin 81 MG tablet Take 81 mg by mouth daily.    . bisoprolol-hydrochlorothiazide (ZIAC) 2.5-6.25 MG tablet Take 1 tablet by mouth daily.    Marland Kitchen ezetimibe-simvastatin (VYTORIN) 10-20 MG tablet Take 0.5 tablets by mouth daily at 6 PM.     . glimepiride (AMARYL) 2 MG tablet Take 1 mg by mouth 2 (two) times daily.     Marland Kitchen levothyroxine (SYNTHROID) 200 MCG tablet Take 200 mcg by mouth daily.    Marland Kitchen liothyronine (CYTOMEL) 5 MCG tablet Take 5 mcg by mouth every other day.     . metFORMIN (GLUCOPHAGE) 1000 MG tablet Take 1,000 mg by mouth 2 (two) times daily.    . Multiple Vitamins-Minerals (CENTRUM SILVER 50+MEN PO) Take 1 tablet by mouth every morning.     . nitroGLYCERIN (NITROSTAT) 0.4 MG SL tablet Place 1 tablet (0.4 mg total) under the tongue every 5 (five) minutes as needed. 25 tablet 6  . VICTOZA 18 MG/3ML SOPN Inject 1.2 mg into the skin daily.   3  . lidocaine (XYLOCAINE) 2 % solution Use as directed 20  mLs in the mouth or throat as needed for mouth pain. (Patient not taking: Reported on 11/26/2015) 100 mL 10   No facility-administered medications prior to visit.      Allergies: Penicillins   Current Outpatient Prescriptions:  .  aspirin 81 MG tablet, Take 81 mg by mouth daily., Disp: , Rfl:  .  bisoprolol-hydrochlorothiazide (ZIAC) 2.5-6.25 MG tablet, Take 1 tablet by mouth daily., Disp: , Rfl:  .  ezetimibe-simvastatin (VYTORIN) 10-20 MG tablet, Take 0.5 tablets by mouth daily at 6 PM. , Disp: , Rfl:  .  glimepiride (AMARYL) 2 MG tablet, Take 1 mg by mouth 2 (two) times daily. , Disp: , Rfl:  .  levothyroxine (SYNTHROID) 200 MCG tablet, Take 200 mcg by mouth daily., Disp: , Rfl:  .  liothyronine (CYTOMEL) 5 MCG tablet, Take 5 mcg by mouth  every other day. , Disp: , Rfl:  .  metFORMIN (GLUCOPHAGE) 1000 MG tablet, Take 1,000 mg by mouth 2 (two) times daily., Disp: , Rfl:  .  Multiple Vitamins-Minerals (CENTRUM SILVER 50+MEN PO), Take 1 tablet by mouth every morning. , Disp: , Rfl:  .  nitroGLYCERIN (NITROSTAT) 0.4 MG SL tablet, Place 1 tablet (0.4 mg total) under the tongue every 5 (five) minutes as needed., Disp: 25 tablet, Rfl: 6 .  VICTOZA 18 MG/3ML SOPN, Inject 1.2 mg into the skin daily. , Disp: , Rfl: 3 .  isosorbide mononitrate (IMDUR) 30 MG 24 hr tablet, Take 1 tablet (30 mg total) by mouth daily., Disp: 30 tablet, Rfl: 6   PHYSICAL EXAM: VS:  BP 136/80 (BP Location: Left Arm, Patient Position: Sitting, Cuff Size: Normal)   Pulse 66   Ht 5\' 10"  (1.778 m)   Wt 264 lb 12 oz (120.1 kg)   BMI 37.99 kg/m  , Body mass index is 37.99 kg/m. Wt Readings from Last 3 Encounters:  11/26/15 264 lb 12 oz (120.1 kg)  11/21/15 269 lb 8 oz (122.2 kg)  11/11/15 270 lb (122.5 kg)    GENERAL:  well developed, well nourished, obese, not in acute distress HEENT: normocephalic, pink conjunctivae, anicteric sclerae, no xanthelasma, normal dentition, oropharynx clear NECK:  no neck vein engorgement, JVP normal, no hepatojugular reflux, carotid upstroke brisk and symmetric, no bruit, no thyromegaly, no lymphadenopathy LUNGS:  good respiratory effort, clear to auscultation bilaterally CV:  PMI not displaced, no thrills, no lifts, S1 and S2 within normal limits, no palpable S3 or S4, no murmurs, no rubs, no gallops ABD:  Soft, nontender, nondistended, normoactive bowel sounds, no abdominal aortic bruit, no hepatomegaly, no splenomegaly MS: nontender back, no kyphosis, no scoliosis, no joint deformities EXT:  2+ DP/PT pulses, no edema, no varicosities, no cyanosis, no clubbing SKIN: warm, nondiaphoretic, normal turgor, no ulcers NEUROPSYCH: alert, oriented to person, place, and time, sensory/motor grossly intact, normal mood, appropriate  affect  Recent Labs: 11/04/2015: ALT 59; BUN 16; Creatinine, Ser 1.19; Platelets 161 11/11/2015: Hemoglobin 17.0; Potassium 4.2; Sodium 139   Lipid Panel No results found for: CHOL, TRIG, HDL, CHOLHDL, VLDL, LDLCALC, LDLDIRECT   Other studies Reviewed:  EKG:  The ekg from 10/30/2015 was personally reviewed by me and it revealed sinus rhythm, 75 BPM, LAD, low voltage QRS.  Additional studies/ records that were reviewed personally reviewed by me today include:  Plain treadmill test c/o PCP 10/09/2015: Modified Bruce protocol 7.5 minutes Test was stopped mid of stage III because of tightness in chest (per pt it was 2/10) EKG showed borderline changes and possible ischemia/nonspecific  ST-T wave changes.  Echo 11/06/2015: Left ventricle: The cavity size was normal. Wall thickness was   increased in a pattern of mild LVH. Systolic function was normal.   The estimated ejection fraction was in the range of 55% to 60%.   Wall motion was normal; there were no regional wall motion   abnormalities. Doppler parameters are consistent with abnormal   left ventricular relaxation (grade 1 diastolic dysfunction). - Aortic valve: There was trivial regurgitation. - Mitral valve: Calcified annulus. There was mild regurgitation. - Left atrium: The atrium was mildly dilated.  Nuclear stress test 11/07/2015:  Blood pressure demonstrated a hypertensive response to exercise.  Upsloping ST segment depression ST segment depression was noted during stress in the II, III, aVF, V3, V4, V5 and V6 leads. The test was switched to [place].  Defect 1: There is a large defect of severe severity present in the mid anterior, mid anteroseptal, apical anterior, apical septal and apex location.  Findings consistent with LAD ischemia.  This is a high risk study.  The left ventricular ejection fraction is moderately decreased (30-44%).  LHC 11/10/2015:  1st Mrg lesion, 40% stenosed.  Prox LAD to Mid LAD lesion,  100% stenosed.  1st Diag lesion, 60% stenosed.  The left ventricular systolic function is normal.   1. Severe one-vessel coronary artery disease with chronically occluded mid LAD after the origin of a large diagonal branch. There are some left to left collaterals and faint right-to-left collaterals. 2. Low normal LV systolic function with an ejection fraction of 50-55% with mild anterior wall hypokinesis. 3. High normal left ventricular end-diastolic pressure.  Recommendations: The patient has a chronic occlusion of the mid LAD with some collaterals. The occlusion appears to be long and diffuse with moderate calcifications. Thus, not optimal for PCI. I recommend medical therapy for coronary artery disease. I will consider adding long-acting nitroglycerin. The patient can  proceed with tongue surgery at an overall moderate risk.    ASSESSMENT AND PLAN: CAD Severe one-vessel CAD with chronically occluded mid LAD after origin of a large diagonal The occlusion appeared to be long diffuse with moderate calcifications, deemed not optimal for PCI. Optimize medical therapy: Continue aspirin 81 mg daily. Start Imdur ER 30 minutes by mouth at night. Patient may take Tylenol for headache. Patient to avoid NSAIDs. Eventually we will switch him over to metoprolol and an ACE inhibitor. Continue statin therapy.   Hypertension Blood pressure has been usually normal in the Q000111Q systolic. Recommend BP log.  Hyperlipidemia LDL goal is less than 70. PCP following labs. He is going for blood work soon.  Obesity Body mass index is 37.99 kg/m.Marland Kitchen Recommend aggressive weight loss through diet and increased physical activity while monitoring for symptoms.  Current medicines are reviewed at length with the patient today.  The patient does not have concerns regarding medicines.  Labs/ tests ordered today include: No orders of the defined types were placed in this encounter.   I had a lengthy and  detailed discussion with the patient regarding diagnoses, prognosis, diagnostic options, treatment options , and side effects of medications.   I counseled the patient on importance of lifestyle modification including heart healthy diet, regular physical activity . We had a lengthy discussion about goal ofpreventing progression of CAD. I spent at least 40 minutes with the patient today and more than 50% of the time was spent counseling the patient and coordinating care.     Disposition:   FU with undersigned in 1 month  Signed, Wende Bushy, MD  11/26/2015 2:08 PM    [place]  This note was generated in part with voice recognition software and I apologize for any typographical errors that were not detected and corrected.

## 2015-12-23 NOTE — Progress Notes (Signed)
Cardiology Office Note   Date:  01/01/2016   ID:  Tony Lawrence, DOB October 17, 1945, MRN [id number]  Referring Doctor:  Tony Guise, MD   Cardiologist:   Tony Bushy, MD   Reason for consultation:  Chief Complaint  Patient presents with  . Other    No complaints today. Meds reviewed verbally with pt.      History of Present Illness: Tony Lawrence is a 70 y.o. male who presents for follow-up   Pt overall doing ok. Had 2 brief and mild episodes of CP since last visit. Not bothersome but one did occur with sexual activity.  ROS:  Please see the history of present illness. Aside from mentioned under HPI, all other systems are reviewed and negative.     Past Medical History:  Diagnosis Date  . Arthritis   . Complication of anesthesia    SEVERE SORE THROAT AFTER BIOPSY 2010  . Diabetes mellitus without complication ([place])   . History of shingles   . Hyperlipidemia   . Hypertension   . Hypothyroidism   . OSA on CPAP   . Tongue cancer ([place])    Squamous cell CA of tongue    Past Surgical History:  Procedure Laterality Date  . BIOPSY TONGUE    . CARDIAC CATHETERIZATION N/A 11/10/2015   Procedure: Left Heart Cath and Coronary Angiography;  Surgeon: Wellington Hampshire, MD;  Location: [place] [facility];  Service: Cardiovascular;  Laterality: N/A;  . COLONOSCOPY  05/2006  . EXCISION OF TONGUE LESION N/A 11/11/2015   Procedure: EXCISION OF TONGUE LESION/ WITH FROZEN SECTION;  Surgeon: Beverly Gust, MD;  Location: [facility];  Service: ENT;  Laterality: N/A;     reports that he has never smoked. He has never used smokeless tobacco. He reports that he drinks alcohol. He reports that he does not use drugs.   family history includes Emphysema in his father; Myelodysplastic syndrome in his mother. No premature CAD in family  Outpatient Medications Prior to Visit  Medication Sig Dispense Refill  . aspirin 81 MG tablet Take 81 mg by mouth daily.    .  bisoprolol-hydrochlorothiazide (ZIAC) 2.5-6.25 MG tablet Take 1 tablet by mouth daily.    Marland Kitchen ezetimibe-simvastatin (VYTORIN) 10-20 MG tablet Take 0.5 tablets by mouth daily at 6 PM.     . glimepiride (AMARYL) 2 MG tablet Take 1 mg by mouth 2 (two) times daily.     Marland Kitchen levothyroxine (SYNTHROID) 200 MCG tablet Take 200 mcg by mouth daily.    Marland Kitchen liothyronine (CYTOMEL) 5 MCG tablet Take 5 mcg by mouth every other day.     . metFORMIN (GLUCOPHAGE) 1000 MG tablet Take 1,000 mg by mouth 2 (two) times daily.    . Multiple Vitamins-Minerals (CENTRUM SILVER 50+MEN PO) Take 1 tablet by mouth every morning.     . nitroGLYCERIN (NITROSTAT) 0.4 MG SL tablet Place 1 tablet (0.4 mg total) under the tongue every 5 (five) minutes as needed. 25 tablet 6  . VICTOZA 18 MG/3ML SOPN Inject 1.2 mg into the skin daily.   3  . isosorbide mononitrate (IMDUR) 30 MG 24 hr tablet Take 1 tablet (30 mg total) by mouth daily. 30 tablet 6   No facility-administered medications prior to visit.      Allergies: Penicillins   Current Outpatient Prescriptions:  .  aspirin 81 MG tablet, Take 81 mg by mouth daily., Disp: , Rfl:  .  bisoprolol-hydrochlorothiazide (ZIAC) 2.5-6.25 MG tablet, Take 1 tablet  by mouth daily., Disp: , Rfl:  .  ezetimibe-simvastatin (VYTORIN) 10-20 MG tablet, Take 0.5 tablets by mouth daily at 6 PM. , Disp: , Rfl:  .  glimepiride (AMARYL) 2 MG tablet, Take 1 mg by mouth 2 (two) times daily. , Disp: , Rfl:  .  levothyroxine (SYNTHROID) 200 MCG tablet, Take 200 mcg by mouth daily., Disp: , Rfl:  .  liothyronine (CYTOMEL) 5 MCG tablet, Take 5 mcg by mouth every other day. , Disp: , Rfl:  .  metFORMIN (GLUCOPHAGE) 1000 MG tablet, Take 1,000 mg by mouth 2 (two) times daily., Disp: , Rfl:  .  Multiple Vitamins-Minerals (CENTRUM SILVER 50+MEN PO), Take 1 tablet by mouth every morning. , Disp: , Rfl:  .  nitroGLYCERIN (NITROSTAT) 0.4 MG SL tablet, Place 1 tablet (0.4 mg total) under the tongue every 5 (five) minutes as  needed., Disp: 25 tablet, Rfl: 6 .  VICTOZA 18 MG/3ML SOPN, Inject 1.2 mg into the skin daily. , Disp: , Rfl: 3 .  isosorbide mononitrate (IMDUR) 30 MG 24 hr tablet, Take 1 tablet (30 mg total) by mouth 2 (two) times daily., Disp: 60 tablet, Rfl: 6   PHYSICAL EXAM: VS:  BP (!) 144/70 (BP Location: Left Arm, Patient Position: Sitting, Cuff Size: Large)   Pulse 72   Ht 5\' 10"  (1.778 m)   Wt 273 lb (123.8 kg)   BMI 39.17 kg/m  , Body mass index is 39.17 kg/m. Wt Readings from Last 3 Encounters:  12/31/15 273 lb (123.8 kg)  11/26/15 264 lb 12 oz (120.1 kg)  11/21/15 269 lb 8 oz (122.2 kg)    GENERAL:  well developed, well nourished, obese, not in acute distress HEENT: normocephalic, pink conjunctivae, anicteric sclerae, no xanthelasma, normal dentition, oropharynx clear NECK:  no neck vein engorgement, JVP normal, no hepatojugular reflux, carotid upstroke brisk and symmetric, no bruit, no thyromegaly, no lymphadenopathy LUNGS:  good respiratory effort, clear to auscultation bilaterally CV:  PMI not displaced, no thrills, no lifts, S1 and S2 within normal limits, no palpable S3 or S4, no murmurs, no rubs, no gallops ABD:  Soft, nontender, nondistended, normoactive bowel sounds, no abdominal aortic bruit, no hepatomegaly, no splenomegaly MS: nontender back, no kyphosis, no scoliosis, no joint deformities EXT:  2+ DP/PT pulses, no edema, no varicosities, no cyanosis, no clubbing SKIN: warm, nondiaphoretic, normal turgor, no ulcers NEUROPSYCH: alert, oriented to person, place, and time, sensory/motor grossly intact, normal mood, appropriate affect  Recent Labs: 11/04/2015: ALT 59; BUN 16; Creatinine, Ser 1.19; Platelets 161 11/11/2015: Hemoglobin 17.0; Potassium 4.2; Sodium 139   Lipid Panel No results found for: CHOL, TRIG, HDL, CHOLHDL, VLDL, LDLCALC, LDLDIRECT   Other studies Reviewed:  EKG:  The ekg from 10/30/2015 was personally reviewed by me and it revealed sinus rhythm, 75 BPM,  LAD, low voltage QRS.  Additional studies/ records that were reviewed personally reviewed by me today include:  Plain treadmill test c/o PCP 10/09/2015: Modified Bruce protocol 7.5 minutes Test was stopped mid of stage III because of tightness in chest (per pt it was 2/10) EKG showed borderline changes and possible ischemia/nonspecific ST-T wave changes.  Echo 11/06/2015: Left ventricle: The cavity size was normal. Wall thickness was   increased in a pattern of mild LVH. Systolic function was normal.   The estimated ejection fraction was in the range of 55% to 60%.   Wall motion was normal; there were no regional wall motion   abnormalities. Doppler parameters are consistent with abnormal  left ventricular relaxation (grade 1 diastolic dysfunction). - Aortic valve: There was trivial regurgitation. - Mitral valve: Calcified annulus. There was mild regurgitation. - Left atrium: The atrium was mildly dilated.  Nuclear stress test 11/07/2015:  Blood pressure demonstrated a hypertensive response to exercise.  Upsloping ST segment depression ST segment depression was noted during stress in the II, III, aVF, V3, V4, V5 and V6 leads. The test was switched to [place].  Defect 1: There is a large defect of severe severity present in the mid anterior, mid anteroseptal, apical anterior, apical septal and apex location.  Findings consistent with LAD ischemia.  This is a high risk study.  The left ventricular ejection fraction is moderately decreased (30-44%).  LHC 11/10/2015:  1st Mrg lesion, 40% stenosed.  Prox LAD to Mid LAD lesion, 100% stenosed.  1st Diag lesion, 60% stenosed.  The left ventricular systolic function is normal.   1. Severe one-vessel coronary artery disease with chronically occluded mid LAD after the origin of a large diagonal branch. There are some left to left collaterals and faint right-to-left collaterals. 2. Low normal LV systolic function with an ejection  fraction of 50-55% with mild anterior wall hypokinesis. 3. High normal left ventricular end-diastolic pressure.  Recommendations: The patient has a chronic occlusion of the mid LAD with some collaterals. The occlusion appears to be long and diffuse with moderate calcifications. Thus, not optimal for PCI. I recommend medical therapy for coronary artery disease. I will consider adding long-acting nitroglycerin. The patient can  proceed with tongue surgery at an overall moderate risk.    ASSESSMENT AND PLAN: CP, stable angina CAD Severe one-vessel CAD with chronically occluded mid LAD after origin of a large diagonal The occlusion appeared to be long diffuse with moderate calcifications, deemed not optimal for PCI. Optimize medical therapy: Continue aspirin 81 mg daily. Increase Imdur ER 30mg  bid. No headaches so far. Patient may take Tylenol for headache. Patient to avoid NSAIDs. Eventually we will switch him over to metoprolol and an ACE inhibitor. Continue statin therapy.   Hypertension Blood pressure has been usually normal in the Q000111Q systolic. Recommend BP log.  Hyperlipidemia LDL goal is less than 70. PCP following labs. He is going for blood work soon.  Obesity Body mass index is 39.17 kg/m.Marland Kitchen Recommend aggressive weight loss through diet and increased physical activity while monitoring for symptoms.  Current medicines are reviewed at length with the patient today.  The patient does not have concerns regarding medicines.  Labs/ tests ordered today include: No orders of the defined types were placed in this encounter.   I had a lengthy and detailed discussion with the patient regarding diagnoses, prognosis, diagnostic options, treatment options , and side effects of medications.   I counseled the patient on importance of lifestyle modification including heart healthy diet, regular physical activity .   Disposition:   FU with undersigned in 3 months  Signed, Tony Bushy,  MD  01/01/2016 2:34 PM    [place]  This note was generated in part with voice recognition software and I apologize for any typographical errors that were not detected and corrected.

## 2015-12-30 ENCOUNTER — Other Ambulatory Visit: Payer: Self-pay

## 2015-12-31 ENCOUNTER — Encounter: Payer: Self-pay | Admitting: Cardiology

## 2015-12-31 ENCOUNTER — Ambulatory Visit (INDEPENDENT_AMBULATORY_CARE_PROVIDER_SITE_OTHER): Payer: Medicare Other | Admitting: Cardiology

## 2015-12-31 VITALS — BP 144/70 | HR 72 | Ht 70.0 in | Wt 273.0 lb

## 2015-12-31 DIAGNOSIS — I10 Essential (primary) hypertension: Secondary | ICD-10-CM

## 2015-12-31 DIAGNOSIS — R079 Chest pain, unspecified: Secondary | ICD-10-CM | POA: Diagnosis not present

## 2015-12-31 DIAGNOSIS — I208 Other forms of angina pectoris: Secondary | ICD-10-CM | POA: Diagnosis not present

## 2015-12-31 DIAGNOSIS — E785 Hyperlipidemia, unspecified: Secondary | ICD-10-CM

## 2015-12-31 DIAGNOSIS — E669 Obesity, unspecified: Secondary | ICD-10-CM

## 2015-12-31 DIAGNOSIS — I2510 Atherosclerotic heart disease of native coronary artery without angina pectoris: Secondary | ICD-10-CM | POA: Diagnosis not present

## 2015-12-31 MED ORDER — ISOSORBIDE MONONITRATE ER 30 MG PO TB24: 30.0000 mg | ORAL_TABLET | Freq: Two times a day (BID) | ORAL | 6 refills | Status: DC

## 2015-12-31 NOTE — Patient Instructions (Signed)
Medication Instructions:  Your physician has recommended you make the following change in your medication:  1. Increase Imdur to 30 mg Twice Daily  Follow-Up: Your physician recommends that you schedule a follow-up appointment in: 3 months with Dr. Yvone Neu.  It was a pleasure seeing you today here in the office. Please do not hesitate to give [place] a call back if you have any further questions. [place], BSN

## 2016-01-06 DIAGNOSIS — E782 Mixed hyperlipidemia: Secondary | ICD-10-CM | POA: Diagnosis not present

## 2016-01-06 DIAGNOSIS — E1165 Type 2 diabetes mellitus with hyperglycemia: Secondary | ICD-10-CM | POA: Diagnosis not present

## 2016-01-06 DIAGNOSIS — R0602 Shortness of breath: Secondary | ICD-10-CM | POA: Diagnosis not present

## 2016-01-06 DIAGNOSIS — Z0001 Encounter for general adult medical examination with abnormal findings: Secondary | ICD-10-CM | POA: Diagnosis not present

## 2016-01-06 DIAGNOSIS — G4733 Obstructive sleep apnea (adult) (pediatric): Secondary | ICD-10-CM | POA: Diagnosis not present

## 2016-01-06 DIAGNOSIS — I2510 Atherosclerotic heart disease of native coronary artery without angina pectoris: Secondary | ICD-10-CM | POA: Diagnosis not present

## 2016-01-21 DIAGNOSIS — G4733 Obstructive sleep apnea (adult) (pediatric): Secondary | ICD-10-CM | POA: Diagnosis not present

## 2016-01-29 DIAGNOSIS — M5117 Intervertebral disc disorders with radiculopathy, lumbosacral region: Secondary | ICD-10-CM | POA: Diagnosis not present

## 2016-01-29 DIAGNOSIS — E1165 Type 2 diabetes mellitus with hyperglycemia: Secondary | ICD-10-CM | POA: Diagnosis not present

## 2016-01-29 DIAGNOSIS — E782 Mixed hyperlipidemia: Secondary | ICD-10-CM | POA: Diagnosis not present

## 2016-01-29 DIAGNOSIS — R740 Nonspecific elevation of levels of transaminase and lactic acid dehydrogenase [LDH]: Secondary | ICD-10-CM | POA: Diagnosis not present

## 2016-01-29 DIAGNOSIS — Z23 Encounter for immunization: Secondary | ICD-10-CM | POA: Diagnosis not present

## 2016-01-29 DIAGNOSIS — Z0001 Encounter for general adult medical examination with abnormal findings: Secondary | ICD-10-CM | POA: Diagnosis not present

## 2016-01-29 DIAGNOSIS — I2510 Atherosclerotic heart disease of native coronary artery without angina pectoris: Secondary | ICD-10-CM | POA: Diagnosis not present

## 2016-01-29 DIAGNOSIS — I10 Essential (primary) hypertension: Secondary | ICD-10-CM | POA: Diagnosis not present

## 2016-03-01 DIAGNOSIS — I10 Essential (primary) hypertension: Secondary | ICD-10-CM | POA: Diagnosis not present

## 2016-03-01 DIAGNOSIS — K219 Gastro-esophageal reflux disease without esophagitis: Secondary | ICD-10-CM | POA: Diagnosis not present

## 2016-03-01 DIAGNOSIS — I2510 Atherosclerotic heart disease of native coronary artery without angina pectoris: Secondary | ICD-10-CM | POA: Diagnosis not present

## 2016-03-01 DIAGNOSIS — E65 Localized adiposity: Secondary | ICD-10-CM | POA: Insufficient documentation

## 2016-03-01 DIAGNOSIS — E7800 Pure hypercholesterolemia, unspecified: Secondary | ICD-10-CM | POA: Diagnosis not present

## 2016-03-04 DIAGNOSIS — E119 Type 2 diabetes mellitus without complications: Secondary | ICD-10-CM | POA: Diagnosis not present

## 2016-03-08 DIAGNOSIS — D045 Carcinoma in situ of skin of trunk: Secondary | ICD-10-CM | POA: Diagnosis not present

## 2016-03-08 DIAGNOSIS — X32XXXA Exposure to sunlight, initial encounter: Secondary | ICD-10-CM | POA: Diagnosis not present

## 2016-03-08 DIAGNOSIS — Z08 Encounter for follow-up examination after completed treatment for malignant neoplasm: Secondary | ICD-10-CM | POA: Diagnosis not present

## 2016-03-08 DIAGNOSIS — D225 Melanocytic nevi of trunk: Secondary | ICD-10-CM | POA: Diagnosis not present

## 2016-03-08 DIAGNOSIS — Z85828 Personal history of other malignant neoplasm of skin: Secondary | ICD-10-CM | POA: Diagnosis not present

## 2016-03-08 DIAGNOSIS — D485 Neoplasm of uncertain behavior of skin: Secondary | ICD-10-CM | POA: Diagnosis not present

## 2016-03-08 DIAGNOSIS — L570 Actinic keratosis: Secondary | ICD-10-CM | POA: Diagnosis not present

## 2016-03-22 DIAGNOSIS — E782 Mixed hyperlipidemia: Secondary | ICD-10-CM | POA: Diagnosis not present

## 2016-03-22 DIAGNOSIS — I2510 Atherosclerotic heart disease of native coronary artery without angina pectoris: Secondary | ICD-10-CM | POA: Diagnosis not present

## 2016-03-22 DIAGNOSIS — I10 Essential (primary) hypertension: Secondary | ICD-10-CM | POA: Diagnosis not present

## 2016-03-22 DIAGNOSIS — E1165 Type 2 diabetes mellitus with hyperglycemia: Secondary | ICD-10-CM | POA: Diagnosis not present

## 2016-03-22 DIAGNOSIS — R740 Nonspecific elevation of levels of transaminase and lactic acid dehydrogenase [LDH]: Secondary | ICD-10-CM | POA: Diagnosis not present

## 2016-03-30 ENCOUNTER — Ambulatory Visit (INDEPENDENT_AMBULATORY_CARE_PROVIDER_SITE_OTHER): Payer: Medicare Other | Admitting: Cardiology

## 2016-03-30 ENCOUNTER — Encounter: Payer: Self-pay | Admitting: Cardiology

## 2016-03-30 VITALS — BP 134/76 | HR 63 | Ht 70.0 in | Wt 274.2 lb

## 2016-03-30 DIAGNOSIS — Z6839 Body mass index (BMI) 39.0-39.9, adult: Secondary | ICD-10-CM

## 2016-03-30 DIAGNOSIS — E6609 Other obesity due to excess calories: Secondary | ICD-10-CM

## 2016-03-30 DIAGNOSIS — I2510 Atherosclerotic heart disease of native coronary artery without angina pectoris: Secondary | ICD-10-CM | POA: Diagnosis not present

## 2016-03-30 DIAGNOSIS — I10 Essential (primary) hypertension: Secondary | ICD-10-CM | POA: Diagnosis not present

## 2016-03-30 DIAGNOSIS — I208 Other forms of angina pectoris: Secondary | ICD-10-CM

## 2016-03-30 DIAGNOSIS — E785 Hyperlipidemia, unspecified: Secondary | ICD-10-CM

## 2016-03-30 DIAGNOSIS — IMO0001 Reserved for inherently not codable concepts without codable children: Secondary | ICD-10-CM

## 2016-03-30 NOTE — Patient Instructions (Signed)
Follow-Up: Your physician wants you to follow-up in: 6 months. You will receive a reminder letter in the mail two months in advance. If you don't receive a letter, please call our office to schedule the follow-up appointment.  It was a pleasure seeing you today here in the office. Please do not hesitate to give us a call back if you have any further questions. [phone]  Pamela A. RN, BSN     

## 2016-03-30 NOTE — Progress Notes (Signed)
Cardiology Office Note   Date:  03/30/2016   ID:  WASH MULLALY, DOB 1945-11-23, MRN [id number]  Referring Doctor:  Lavera Guise, MD   Cardiologist:   Wende Bushy, MD   Reason for consultation:  Chief Complaint  Patient presents with  . OTHER    3 month f/u. Meds reviewed verbally with pt. Pt states he is doing well.      History of Present Illness: Tony Lawrence is a 70 y.o. male who presents for follow-up   Since last visit, patient has been doing okay. He does not recall any significant recurrence of chest pain. He has gone for second opinion from another cardiologist. He has several questions regarding medication changes that were made.   ROS:  Please see the history of present illness. Aside from mentioned under HPI, all other systems are reviewed and negative.     Past Medical History:  Diagnosis Date  . Arthritis   . Complication of anesthesia    SEVERE SORE THROAT AFTER BIOPSY 2010  . Diabetes mellitus without complication ([place])   . History of shingles   . Hyperlipidemia   . Hypertension   . Hypothyroidism   . OSA on CPAP   . Tongue cancer ([place])    Squamous cell CA of tongue    Past Surgical History:  Procedure Laterality Date  . BIOPSY TONGUE    . CARDIAC CATHETERIZATION N/A 11/10/2015   Procedure: Left Heart Cath and Coronary Angiography;  Surgeon: Wellington Hampshire, MD;  Location: [facility];  Service: Cardiovascular;  Laterality: N/A;  . COLONOSCOPY  05/2006  . EXCISION OF TONGUE LESION N/A 11/11/2015   Procedure: EXCISION OF TONGUE LESION/ WITH FROZEN SECTION;  Surgeon: Beverly Gust, MD;  Location: [facility];  Service: ENT;  Laterality: N/A;     reports that he has never smoked. He has never used smokeless tobacco. He reports that he drinks alcohol. He reports that he does not use drugs.   family history includes Emphysema in his father; Myelodysplastic syndrome in his mother. No premature CAD in family  Outpatient Medications  Prior to Visit  Medication Sig Dispense Refill  . aspirin 81 MG tablet Take 81 mg by mouth daily.    . bisoprolol-hydrochlorothiazide (ZIAC) 2.5-6.25 MG tablet Take 1 tablet by mouth daily.    Marland Kitchen ezetimibe-simvastatin (VYTORIN) 10-20 MG tablet Take 0.5 tablets by mouth daily at 6 PM.     . glimepiride (AMARYL) 2 MG tablet Take 1 mg by mouth 2 (two) times daily.     . isosorbide mononitrate (IMDUR) 30 MG 24 hr tablet Take 1 tablet (30 mg total) by mouth 2 (two) times daily. 60 tablet 6  . levothyroxine (SYNTHROID) 200 MCG tablet Take 200 mcg by mouth daily.    Marland Kitchen liothyronine (CYTOMEL) 5 MCG tablet Take 5 mcg by mouth every other day.     . metFORMIN (GLUCOPHAGE) 1000 MG tablet Take 1,000 mg by mouth 2 (two) times daily.    . Multiple Vitamins-Minerals (CENTRUM SILVER 50+MEN PO) Take 1 tablet by mouth every morning.     . nitroGLYCERIN (NITROSTAT) 0.4 MG SL tablet Place 1 tablet (0.4 mg total) under the tongue every 5 (five) minutes as needed. 25 tablet 6  . VICTOZA 18 MG/3ML SOPN Inject 1.2 mg into the skin daily.   3   No facility-administered medications prior to visit.      Allergies: Penicillins   Current Outpatient Prescriptions:  .  aspirin  81 MG tablet, Take 81 mg by mouth daily., Disp: , Rfl:  .  bisoprolol-hydrochlorothiazide (ZIAC) 2.5-6.25 MG tablet, Take 1 tablet by mouth daily., Disp: , Rfl:  .  clopidogrel (PLAVIX) 75 MG tablet, Take 75 mg by mouth daily., Disp: , Rfl:  .  ezetimibe-simvastatin (VYTORIN) 10-20 MG tablet, Take 0.5 tablets by mouth daily at 6 PM. , Disp: , Rfl:  .  glimepiride (AMARYL) 2 MG tablet, Take 1 mg by mouth 2 (two) times daily. , Disp: , Rfl:  .  isosorbide mononitrate (IMDUR) 30 MG 24 hr tablet, Take 1 tablet (30 mg total) by mouth 2 (two) times daily., Disp: 60 tablet, Rfl: 6 .  levothyroxine (SYNTHROID) 200 MCG tablet, Take 200 mcg by mouth daily., Disp: , Rfl:  .  liothyronine (CYTOMEL) 5 MCG tablet, Take 5 mcg by mouth every other day. , Disp: ,  Rfl:  .  metFORMIN (GLUCOPHAGE) 1000 MG tablet, Take 1,000 mg by mouth 2 (two) times daily., Disp: , Rfl:  .  Multiple Vitamins-Minerals (CENTRUM SILVER 50+MEN PO), Take 1 tablet by mouth every morning. , Disp: , Rfl:  .  nitroGLYCERIN (NITROSTAT) 0.4 MG SL tablet, Place 1 tablet (0.4 mg total) under the tongue every 5 (five) minutes as needed., Disp: 25 tablet, Rfl: 6 .  omeprazole (PRILOSEC) 40 MG capsule, Take 40 mg by mouth daily., Disp: , Rfl:  .  ramipril (ALTACE) 10 MG capsule, Take 10 mg by mouth daily., Disp: , Rfl:  .  VICTOZA 18 MG/3ML SOPN, Inject 1.2 mg into the skin daily. , Disp: , Rfl: 3   PHYSICAL EXAM: VS:  BP 134/76 (BP Location: Left Arm, Patient Position: Sitting, Cuff Size: Large)   Pulse 63   Ht 5\' 10"  (1.778 m)   Wt 274 lb 4 oz (124.4 kg)   BMI 39.35 kg/m  , Body mass index is 39.35 kg/m. Wt Readings from Last 3 Encounters:  03/30/16 274 lb 4 oz (124.4 kg)  12/31/15 273 lb (123.8 kg)  11/26/15 264 lb 12 oz (120.1 kg)    GENERAL:  well developed, well nourished, obese, not in acute distress HEENT: normocephalic, pink conjunctivae, anicteric sclerae, no xanthelasma, normal dentition, oropharynx clear NECK:  no neck vein engorgement, JVP normal, no hepatojugular reflux, carotid upstroke brisk and symmetric, no bruit, no thyromegaly, no lymphadenopathy LUNGS:  good respiratory effort, clear to auscultation bilaterally CV:  PMI not displaced, no thrills, no lifts, S1 and S2 within normal limits, no palpable S3 or S4, no murmurs, no rubs, no gallops ABD:  Soft, nontender, nondistended, normoactive bowel sounds, no abdominal aortic bruit, no hepatomegaly, no splenomegaly MS: nontender back, no kyphosis, no scoliosis, no joint deformities EXT:  2+ DP/PT pulses, no edema, no varicosities, no cyanosis, no clubbing SKIN: warm, nondiaphoretic, normal turgor, no ulcers NEUROPSYCH: alert, oriented to person, place, and time, sensory/motor grossly intact, normal mood,  appropriate affect  Recent Labs: 11/04/2015: ALT 59; BUN 16; Creatinine, Ser 1.19; Platelets 161 11/11/2015: Hemoglobin 17.0; Potassium 4.2; Sodium 139   Lipid Panel No results found for: CHOL, TRIG, HDL, CHOLHDL, VLDL, LDLCALC, LDLDIRECT   Other studies Reviewed:  EKG:  The ekg from 10/30/2015 was personally reviewed by me and it revealed sinus rhythm, 75 BPM, LAD, low voltage QRS.  Additional studies/ records that were reviewed personally reviewed by me today include:  Plain treadmill test c/o PCP 10/09/2015: Modified Bruce protocol 7.5 minutes Test was stopped mid of stage III because of tightness in chest (per pt  it was 2/10) EKG showed borderline changes and possible ischemia/nonspecific ST-T wave changes.  Echo 11/06/2015: Left ventricle: The cavity size was normal. Wall thickness was   increased in a pattern of mild LVH. Systolic function was normal.   The estimated ejection fraction was in the range of 55% to 60%.   Wall motion was normal; there were no regional wall motion   abnormalities. Doppler parameters are consistent with abnormal   left ventricular relaxation (grade 1 diastolic dysfunction). - Aortic valve: There was trivial regurgitation. - Mitral valve: Calcified annulus. There was mild regurgitation. - Left atrium: The atrium was mildly dilated.  Nuclear stress test 11/07/2015:  Blood pressure demonstrated a hypertensive response to exercise.  Upsloping ST segment depression ST segment depression was noted during stress in the II, III, aVF, V3, V4, V5 and V6 leads. The test was switched to [place].  Defect 1: There is a large defect of severe severity present in the mid anterior, mid anteroseptal, apical anterior, apical septal and apex location.  Findings consistent with LAD ischemia.  This is a high risk study.  The left ventricular ejection fraction is moderately decreased (30-44%).  LHC 11/10/2015:  1st Mrg lesion, 40% stenosed.  Prox LAD to Mid  LAD lesion, 100% stenosed.  1st Diag lesion, 60% stenosed.  The left ventricular systolic function is normal.   1. Severe one-vessel coronary artery disease with chronically occluded mid LAD after the origin of a large diagonal branch. There are some left to left collaterals and faint right-to-left collaterals. 2. Low normal LV systolic function with an ejection fraction of 50-55% with mild anterior wall hypokinesis. 3. High normal left ventricular end-diastolic pressure.  Recommendations: The patient has a chronic occlusion of the mid LAD with some collaterals. The occlusion appears to be long and diffuse with moderate calcifications. Thus, not optimal for PCI. I recommend medical therapy for coronary artery disease. I will consider adding long-acting nitroglycerin. The patient can  proceed with tongue surgery at an overall moderate risk.    ASSESSMENT AND PLAN: CAD Severe one-vessel CAD with chronically occluded mid LAD after origin of a large diagonal The occlusion appeared to be long diffuse with moderate calcifications, deemed not optimal for PCI. Optimize medical therapy:  Continue aspirin 81 mg daily.  Continue Imdur ER 30mg  bid.  Agree with Ramipril Avoid NSAIDs. Agree with uptitrating beta blocker, whether bisoprolol or switching to metoprolol or carvedilol.  Agree with atorvastatin. May start at 20 mg by mouth daily at bedtime, serial lipid panel and LFTs.   Hypertension Blood pressure has been usually normal in the [id number] systolic. Recommend BP log.  Hyperlipidemia LDL goal is less than 70. PCP following labs. He is going for blood work soon.  Obesity Body mass index is 39.35 kg/m.Marland Kitchen Recommend aggressive weight loss through diet and increased physical activity while monitoring for symptoms.  Current medicines are reviewed at length with the patient today.  The patient does not have concerns regarding medicines.  Labs/ tests ordered today include:  Orders Placed This  Encounter  Procedures  . EKG 12-Lead    I had a lengthy and detailed discussion with the patient regarding diagnoses, prognosis, diagnostic options, treatment options , and side effects of medications.   I counseled the patient on importance of lifestyle modification including heart healthy diet, regular physical activity .   Disposition:   FU with undersigned in 6 months, if he prefers to follow-up with our practice  Signed, Wende Bushy, MD  03/30/2016 10:40  AM    [place]  This note was generated in part with voice recognition software and I apologize for any typographical errors that were not detected and corrected.

## 2016-04-09 DIAGNOSIS — D045 Carcinoma in situ of skin of trunk: Secondary | ICD-10-CM | POA: Diagnosis not present

## 2016-04-09 DIAGNOSIS — L905 Scar conditions and fibrosis of skin: Secondary | ICD-10-CM | POA: Diagnosis not present

## 2016-05-03 DIAGNOSIS — E119 Type 2 diabetes mellitus without complications: Secondary | ICD-10-CM | POA: Diagnosis not present

## 2016-05-03 DIAGNOSIS — I10 Essential (primary) hypertension: Secondary | ICD-10-CM | POA: Diagnosis not present

## 2016-05-03 DIAGNOSIS — E782 Mixed hyperlipidemia: Secondary | ICD-10-CM | POA: Diagnosis not present

## 2016-05-04 DIAGNOSIS — C029 Malignant neoplasm of tongue, unspecified: Secondary | ICD-10-CM | POA: Diagnosis not present

## 2016-05-06 DIAGNOSIS — I2510 Atherosclerotic heart disease of native coronary artery without angina pectoris: Secondary | ICD-10-CM | POA: Diagnosis not present

## 2016-05-06 DIAGNOSIS — E039 Hypothyroidism, unspecified: Secondary | ICD-10-CM | POA: Diagnosis not present

## 2016-05-06 DIAGNOSIS — E1165 Type 2 diabetes mellitus with hyperglycemia: Secondary | ICD-10-CM | POA: Diagnosis not present

## 2016-05-06 DIAGNOSIS — I10 Essential (primary) hypertension: Secondary | ICD-10-CM | POA: Diagnosis not present

## 2016-07-09 ENCOUNTER — Other Ambulatory Visit: Payer: Self-pay | Admitting: Cardiology

## 2016-07-28 DIAGNOSIS — G4733 Obstructive sleep apnea (adult) (pediatric): Secondary | ICD-10-CM | POA: Diagnosis not present

## 2016-08-02 DIAGNOSIS — C029 Malignant neoplasm of tongue, unspecified: Secondary | ICD-10-CM | POA: Diagnosis not present

## 2016-08-09 DIAGNOSIS — L570 Actinic keratosis: Secondary | ICD-10-CM | POA: Diagnosis not present

## 2016-08-09 DIAGNOSIS — Z08 Encounter for follow-up examination after completed treatment for malignant neoplasm: Secondary | ICD-10-CM | POA: Diagnosis not present

## 2016-08-09 DIAGNOSIS — Z85828 Personal history of other malignant neoplasm of skin: Secondary | ICD-10-CM | POA: Diagnosis not present

## 2016-08-09 DIAGNOSIS — L821 Other seborrheic keratosis: Secondary | ICD-10-CM | POA: Diagnosis not present

## 2016-08-09 DIAGNOSIS — X32XXXA Exposure to sunlight, initial encounter: Secondary | ICD-10-CM | POA: Diagnosis not present

## 2016-08-13 DIAGNOSIS — I10 Essential (primary) hypertension: Secondary | ICD-10-CM | POA: Diagnosis not present

## 2016-08-13 DIAGNOSIS — E782 Mixed hyperlipidemia: Secondary | ICD-10-CM | POA: Diagnosis not present

## 2016-08-13 DIAGNOSIS — E119 Type 2 diabetes mellitus without complications: Secondary | ICD-10-CM | POA: Diagnosis not present

## 2016-09-01 DIAGNOSIS — Z6839 Body mass index (BMI) 39.0-39.9, adult: Secondary | ICD-10-CM | POA: Diagnosis not present

## 2016-09-01 DIAGNOSIS — N481 Balanitis: Secondary | ICD-10-CM | POA: Diagnosis not present

## 2016-09-07 DIAGNOSIS — N481 Balanitis: Secondary | ICD-10-CM | POA: Diagnosis not present

## 2016-11-02 DIAGNOSIS — I2510 Atherosclerotic heart disease of native coronary artery without angina pectoris: Secondary | ICD-10-CM | POA: Diagnosis not present

## 2016-11-02 DIAGNOSIS — I10 Essential (primary) hypertension: Secondary | ICD-10-CM | POA: Diagnosis not present

## 2016-11-02 DIAGNOSIS — E1165 Type 2 diabetes mellitus with hyperglycemia: Secondary | ICD-10-CM | POA: Diagnosis not present

## 2016-11-02 DIAGNOSIS — R05 Cough: Secondary | ICD-10-CM | POA: Diagnosis not present

## 2016-11-02 DIAGNOSIS — E039 Hypothyroidism, unspecified: Secondary | ICD-10-CM | POA: Diagnosis not present

## 2016-11-02 DIAGNOSIS — E782 Mixed hyperlipidemia: Secondary | ICD-10-CM | POA: Diagnosis not present

## 2016-11-02 DIAGNOSIS — E669 Obesity, unspecified: Secondary | ICD-10-CM | POA: Diagnosis not present

## 2016-12-02 ENCOUNTER — Ambulatory Visit (INDEPENDENT_AMBULATORY_CARE_PROVIDER_SITE_OTHER): Payer: Medicare Other | Admitting: Internal Medicine

## 2016-12-02 ENCOUNTER — Encounter: Payer: Self-pay | Admitting: Internal Medicine

## 2016-12-02 VITALS — BP 142/72 | HR 58 | Ht 70.0 in | Wt 266.8 lb

## 2016-12-02 DIAGNOSIS — I10 Essential (primary) hypertension: Secondary | ICD-10-CM | POA: Diagnosis not present

## 2016-12-02 DIAGNOSIS — E785 Hyperlipidemia, unspecified: Secondary | ICD-10-CM

## 2016-12-02 DIAGNOSIS — I2510 Atherosclerotic heart disease of native coronary artery without angina pectoris: Secondary | ICD-10-CM

## 2016-12-02 DIAGNOSIS — C029 Malignant neoplasm of tongue, unspecified: Secondary | ICD-10-CM | POA: Diagnosis not present

## 2016-12-02 NOTE — Patient Instructions (Signed)
Medication Instructions:  Your physician recommends that you continue on your current medications as directed. Please refer to the Current Medication list given to you today.   Labwork: Labwork will be requested from your primary care physician.   Testing/Procedures: none  Follow-Up: Your physician wants you to follow-up in: 12 MONTHS WITH DR END. You will receive a reminder letter in the mail two months in advance. If you don't receive a letter, please call our office to schedule the follow-up appointment.     DASH Eating Plan DASH stands for "Dietary Approaches to Stop Hypertension." The DASH eating plan is a healthy eating plan that has been shown to reduce high blood pressure (hypertension). It may also reduce your risk for type 2 diabetes, heart disease, and stroke. The DASH eating plan may also help with weight loss. What are tips for following this plan? General guidelines  Avoid eating more than 2,300 mg (milligrams) of salt (sodium) a day. If you have hypertension, you may need to reduce your sodium intake to 1,500 mg a day.  Limit alcohol intake to no more than 1 drink a day for nonpregnant women and 2 drinks a day for men. One drink equals 12 oz of beer, 5 oz of wine, or 1 oz of hard liquor.  Work with your health care provider to maintain a healthy body weight or to lose weight. Ask what an ideal weight is for you.  Get at least 30 minutes of exercise that causes your heart to beat faster (aerobic exercise) most days of the week. Activities may include walking, swimming, or biking.  Work with your health care provider or diet and nutrition specialist (dietitian) to adjust your eating plan to your individual calorie needs. Reading food labels  Check food labels for the amount of sodium per serving. Choose foods with less than 5 percent of the Daily Value of sodium. Generally, foods with less than 300 mg of sodium per serving fit into this eating plan.  To find whole  grains, look for the word "whole" as the first word in the ingredient list. Shopping  Buy products labeled as "low-sodium" or "no salt added."  Buy fresh foods. Avoid canned foods and premade or frozen meals. Cooking  Avoid adding salt when cooking. Use salt-free seasonings or herbs instead of table salt or sea salt. Check with your health care provider or pharmacist before using salt substitutes.  Do not fry foods. Cook foods using healthy methods such as baking, boiling, grilling, and broiling instead.  Cook with heart-healthy oils, such as olive, canola, soybean, or sunflower oil. Meal planning   Eat a balanced diet that includes: ? 5 or more servings of fruits and vegetables each day. At each meal, try to fill half of your plate with fruits and vegetables. ? Up to 6-8 servings of whole grains each day. ? Less than 6 oz of lean meat, poultry, or fish each day. A 3-oz serving of meat is about the same size as a deck of cards. One egg equals 1 oz. ? 2 servings of low-fat dairy each day. ? A serving of nuts, seeds, or beans 5 times each week. ? Heart-healthy fats. Healthy fats called Omega-3 fatty acids are found in foods such as flaxseeds and coldwater fish, like sardines, salmon, and mackerel.  Limit how much you eat of the following: ? Canned or prepackaged foods. ? Food that is high in trans fat, such as fried foods. ? Food that is high in saturated fat,  such as fatty meat. ? Sweets, desserts, sugary drinks, and other foods with added sugar. ? Full-fat dairy products.  Do not salt foods before eating.  Try to eat at least 2 vegetarian meals each week.  Eat more home-cooked food and less restaurant, buffet, and fast food.  When eating at a restaurant, ask that your food be prepared with less salt or no salt, if possible. What foods are recommended? The items listed may not be a complete list. Talk with your dietitian about what dietary choices are best for  you. Grains Whole-grain or whole-wheat bread. Whole-grain or whole-wheat pasta. Brown rice. [place]. Bulgur. Whole-grain and low-sodium cereals. Pita bread. Low-fat, low-sodium crackers. Whole-wheat flour tortillas. Vegetables Fresh or frozen vegetables (raw, steamed, roasted, or grilled). Low-sodium or reduced-sodium tomato and vegetable juice. Low-sodium or reduced-sodium tomato sauce and tomato paste. Low-sodium or reduced-sodium canned vegetables. Fruits All fresh, dried, or frozen fruit. Canned fruit in natural juice (without added sugar). Meat and other protein foods Skinless chicken or [place]. Ground chicken or [place]. Pork with fat trimmed off. Fish and seafood. Egg whites. Dried beans, peas, or lentils. Unsalted nuts, nut butters, and seeds. Unsalted canned beans. Lean cuts of beef with fat trimmed off. Low-sodium, lean deli meat. Dairy Low-fat (1%) or fat-free (skim) milk. Fat-free, low-fat, or reduced-fat cheeses. Nonfat, low-sodium ricotta or cottage cheese. Low-fat or nonfat yogurt. Low-fat, low-sodium cheese. Fats and oils Soft margarine without trans fats. Vegetable oil. Low-fat, reduced-fat, or light mayonnaise and salad dressings (reduced-sodium). Canola, safflower, olive, soybean, and sunflower oils. Avocado. Seasoning and other foods Herbs. Spices. Seasoning mixes without salt. Unsalted popcorn and pretzels. Fat-free sweets. What foods are not recommended? The items listed may not be a complete list. Talk with your dietitian about what dietary choices are best for you. Grains Baked goods made with fat, such as croissants, muffins, or some breads. Dry pasta or rice meal packs. Vegetables Creamed or fried vegetables. Vegetables in a cheese sauce. Regular canned vegetables (not low-sodium or reduced-sodium). Regular canned tomato sauce and paste (not low-sodium or reduced-sodium). Regular tomato and vegetable juice (not low-sodium or reduced-sodium). Angie Fava.  Olives. Fruits Canned fruit in a light or heavy syrup. Fried fruit. Fruit in cream or butter sauce. Meat and other protein foods Fatty cuts of meat. Ribs. Fried meat. Berniece Salines. Sausage. Bologna and other processed lunch meats. Salami. Fatback. Hotdogs. Bratwurst. Salted nuts and seeds. Canned beans with added salt. Canned or smoked fish. Whole eggs or egg yolks. Chicken or [place] with skin. Dairy Whole or 2% milk, cream, and half-and-half. Whole or full-fat cream cheese. Whole-fat or sweetened yogurt. Full-fat cheese. Nondairy creamers. Whipped toppings. Processed cheese and cheese spreads. Fats and oils Butter. Stick margarine. Lard. Shortening. Ghee. Bacon fat. Tropical oils, such as coconut, palm kernel, or palm oil. Seasoning and other foods Salted popcorn and pretzels. Onion salt, garlic salt, seasoned salt, table salt, and sea salt. Worcestershire sauce. Tartar sauce. Barbecue sauce. Teriyaki sauce. Soy sauce, including reduced-sodium. Steak sauce. Canned and packaged gravies. Fish sauce. Oyster sauce. Cocktail sauce. Horseradish that you find on the shelf. Ketchup. Mustard. Meat flavorings and tenderizers. Bouillon cubes. Hot sauce and Tabasco sauce. Premade or packaged marinades. Premade or packaged taco seasonings. Relishes. Regular salad dressings. Where to find more information:  National Heart, Lung, and [place]: https://wilson-eaton.com/  American Heart Association: www.heart.org Summary  The DASH eating plan is a healthy eating plan that has been shown to reduce high blood pressure (hypertension). It may also reduce your  risk for type 2 diabetes, heart disease, and stroke.  With the DASH eating plan, you should limit salt (sodium) intake to 2,300 mg a day. If you have hypertension, you may need to reduce your sodium intake to 1,500 mg a day.  When on the DASH eating plan, aim to eat more fresh fruits and vegetables, whole grains, lean proteins, low-fat dairy, and heart-healthy  fats.  Work with your health care provider or diet and nutrition specialist (dietitian) to adjust your eating plan to your individual calorie needs. This information is not intended to replace advice given to you by your health care provider. Make sure you discuss any questions you have with your health care provider. Document Released: 04/01/2011 Document Revised: 04/05/2016 Document Reviewed: 04/05/2016 Elsevier Interactive Patient Education  2017 [employer].

## 2016-12-02 NOTE — Progress Notes (Signed)
Follow-up Outpatient Visit Date: 12/02/2016  Primary Care Provider: Lavera Guise, MD [address] [place] [address]  Chief Complaint: Follow-up coronary artery disease  HPI:  Mr. Detty is a 71 y.o. year-old male with history of coronary artery disease with chronic total occlusion of the mid LAD, hypertension, hyperlipidemia, diabetes mellitus, obstructive sleep apnea, and hypothyroidism, who presents for follow-up of coronary artery disease. He was previously followed by Dr. Yvone Neu, having last been seen in December, 2017. Since that time, Mr. Leverette has been doing well. He denies chest pain, shortness of breath, palpitations, lightheadedness, and edema. His only complaints are of chronic knee and low back pain, which will limit his exercise. He uses his pool from time to time. He is tolerating his medications well. He remains on dual a type of therapy with aspirin and clopidogrel without significant bleeding. Mr. Loewen happily reports that he has lost about 40 pounds over the last 2-3 years.  --------------------------------------------------------------------------------------------------  Past Medical History:  Diagnosis Date  . Arthritis   . Complication of anesthesia    SEVERE SORE THROAT AFTER BIOPSY 2010  . Coronary artery disease    Chronic total occlusion of mid LAD  . Diabetes mellitus without complication ([place])   . History of shingles   . Hyperlipidemia   . Hypertension   . Hypothyroidism   . OSA on CPAP   . Tongue cancer ([place])    Squamous cell CA of tongue   Past Surgical History:  Procedure Laterality Date  . BIOPSY TONGUE    . CARDIAC CATHETERIZATION N/A 11/10/2015   Procedure: Left Heart Cath and Coronary Angiography;  Surgeon: Wellington Hampshire, MD;  Location: [facility];  Service: Cardiovascular;  Laterality: N/A;  . COLONOSCOPY  05/2006  . EXCISION OF TONGUE LESION N/A 11/11/2015   Procedure: EXCISION OF TONGUE LESION/ WITH FROZEN SECTION;   Surgeon: Beverly Gust, MD;  Location: [facility];  Service: ENT;  Laterality: N/A;    Current Meds  Medication Sig  . aspirin 81 MG tablet Take 81 mg by mouth daily.  Marland Kitchen atorvastatin (LIPITOR) 20 MG tablet Take 20 mg by mouth daily.  . bisoprolol-hydrochlorothiazide (ZIAC) 2.5-6.25 MG tablet Take 1 tablet by mouth daily.  . clopidogrel (PLAVIX) 75 MG tablet Take 75 mg by mouth daily.  . empagliflozin (JARDIANCE) 25 MG TABS tablet Take 25 mg by mouth daily.  Marland Kitchen glimepiride (AMARYL) 2 MG tablet Take 1 mg by mouth 2 (two) times daily.   . isosorbide mononitrate (IMDUR) 30 MG 24 hr tablet TAKE ONE TABLET BY MOUTH TWICE A DAY  . levothyroxine (SYNTHROID) 200 MCG tablet Take 200 mcg by mouth daily.  Marland Kitchen liothyronine (CYTOMEL) 5 MCG tablet Take 5 mcg by mouth every other day.   . metFORMIN (GLUCOPHAGE) 1000 MG tablet Take 1,000 mg by mouth 2 (two) times daily.  . Multiple Vitamins-Minerals (CENTRUM SILVER 50+MEN PO) Take 1 tablet by mouth every morning.   . nitroGLYCERIN (NITROSTAT) 0.4 MG SL tablet Place 1 tablet (0.4 mg total) under the tongue every 5 (five) minutes as needed.  Marland Kitchen omeprazole (PRILOSEC) 40 MG capsule Take 40 mg by mouth daily.  . ramipril (ALTACE) 10 MG capsule Take 10 mg by mouth daily.    Allergies: Penicillins  Social History   Social History  . Marital status: Married    Spouse name: N/A  . Number of children: N/A  . Years of education: N/A   Occupational History  . Not on file.  Social History Main Topics  . Smoking status: Never Smoker  . Smokeless tobacco: Never Used  . Alcohol use 0.0 oz/week     Comment: occassionally   . Drug use: No  . Sexual activity: Not on file   Other Topics Concern  . Not on file   Social History Narrative  . No narrative on file    Family History  Problem Relation Age of Onset  . Myelodysplastic syndrome Mother   . Emphysema Father     Review of Systems: A 12-system review of systems was performed and was negative  except as noted in the HPI.  --------------------------------------------------------------------------------------------------  Physical Exam: BP (!) 142/72 (BP Location: Left Arm, Patient Position: Sitting, Cuff Size: Normal)   Pulse (!) 58   Ht 5\' 10"  (1.778 m)   Wt 266 lb 12 oz (121 kg)   BMI 38.27 kg/m   General:  Obese man, seated comfortably in the exam room. HEENT: No conjunctival pallor or scleral icterus. Moist mucous membranes.  OP clear. Neck: Supple without lymphadenopathy, thyromegaly, JVD, or HJR. No carotid bruit. Lungs: Normal work of breathing. Clear to auscultation bilaterally without wheezes or crackles. Heart: Bradycardic but regular without murmurs, rubs, or gallops. Unable to assess PMI due to body habitus. Abd: Bowel sounds present. Soft, NT/ND. Unable to assess HSM due to body habitus. Ext: No lower extremity edema. Radial, PT, and DP pulses are 2+ bilaterally. Skin: Warm and dry without rash.  EKG:  Sinus bradycardia (heart rate 58 bpm), poor R-wave progression in V1 and V2 that may reflect septal infarct or lead placement. Left axis deviation.  Lab Results  Component Value Date   WBC 8.4 11/04/2015   HGB 17.0 11/11/2015   HCT 50.0 11/11/2015   MCV 93.0 11/04/2015   PLT 161 11/04/2015    Lab Results  Component Value Date   NA 139 11/11/2015   K 4.2 11/11/2015   CL 99 (L) 11/04/2015   CO2 30 11/04/2015   BUN 16 11/04/2015   CREATININE 1.19 11/04/2015   GLUCOSE 171 (H) 11/11/2015   ALT 59 11/04/2015    No results found for: CHOL, HDL, LDLCALC, LDLDIRECT, TRIG, CHOLHDL  --------------------------------------------------------------------------------------------------  ASSESSMENT AND PLAN: Coronary artery disease without angina Mr. Stilley is doing well without any symptoms of recurrent angina in the setting of chronic total occlusion of the mid LAD. We will continue his current medications including indefinite dual antiplatelet therapy with  aspirin and clopidogrel, as he is tolerating this well. I encouraged him to be as active as possible and to continue to lose weight.  Hypertension Blood pressure is mildly elevated today. We discussed escalation of REM ramipril versus lifestyle modification. I counseled him to decrease his sodium intake. He would like to try this before making any medication changes. I will defer further management to his primary care provider, Dr. Humphrey Rolls.  Hyperlipidemia Mr. Sahlin is tolerating atorvastatin 20 mg daily well. We do not have a recent lipid panel in her system, as this is monitored by Dr. Humphrey Rolls. We will request a copy of his most recent labs. I have also asked Mr. Holness to discuss increasing atorvastatin to at least 40 mg daily to optimize his regimen for secondary prevention of CAD.  Follow-up: Return to clinic in 1 year.  Nelva Bush, MD 12/02/2016 9:54 AM

## 2016-12-03 ENCOUNTER — Encounter: Payer: Self-pay | Admitting: Internal Medicine

## 2016-12-17 DIAGNOSIS — Z7902 Long term (current) use of antithrombotics/antiplatelets: Secondary | ICD-10-CM | POA: Diagnosis not present

## 2016-12-17 DIAGNOSIS — Z1211 Encounter for screening for malignant neoplasm of colon: Secondary | ICD-10-CM | POA: Diagnosis not present

## 2016-12-20 ENCOUNTER — Telehealth: Payer: Self-pay | Admitting: *Deleted

## 2016-12-20 NOTE — Telephone Encounter (Signed)
Requesting cardiac clearance:   1. Type of surgery: colonoscopy  2. Surgeon: Dr Gustavo Lah  3. Surgical date: 05/24/2017  4. Medications that need to be held: Plavix     5. I will defer to: Dr End   Fax to [phone]

## 2016-12-21 NOTE — Telephone Encounter (Signed)
Clearance routed to number provided via EPIC fax.

## 2016-12-21 NOTE — Telephone Encounter (Signed)
It is fine for Tony Lawrence to proceed with colonoscopy from a cardiac standpoint. He should stop taking clopidogrel 7 days prior to the procedure and restart the medication when it is felt safe to do so by his gastroenterologist. Thanks.  Nelva Bush, MD [facility] Pager: [phone]

## 2017-01-03 ENCOUNTER — Other Ambulatory Visit: Payer: Self-pay | Admitting: *Deleted

## 2017-01-03 NOTE — Telephone Encounter (Signed)
Please advise if ok to refill Medications listed.

## 2017-01-04 DIAGNOSIS — R0602 Shortness of breath: Secondary | ICD-10-CM | POA: Diagnosis not present

## 2017-01-04 DIAGNOSIS — G4733 Obstructive sleep apnea (adult) (pediatric): Secondary | ICD-10-CM | POA: Diagnosis not present

## 2017-01-04 DIAGNOSIS — E039 Hypothyroidism, unspecified: Secondary | ICD-10-CM | POA: Diagnosis not present

## 2017-01-04 MED ORDER — RAMIPRIL 10 MG PO CAPS: 10.0000 mg | ORAL_CAPSULE | Freq: Every day | ORAL | 3 refills | Status: DC

## 2017-01-04 MED ORDER — CLOPIDOGREL BISULFATE 75 MG PO TABS: 75.0000 mg | ORAL_TABLET | Freq: Every day | ORAL | 3 refills | Status: DC

## 2017-02-28 DIAGNOSIS — C029 Malignant neoplasm of tongue, unspecified: Secondary | ICD-10-CM | POA: Diagnosis not present

## 2017-03-08 DIAGNOSIS — L570 Actinic keratosis: Secondary | ICD-10-CM | POA: Diagnosis not present

## 2017-03-08 DIAGNOSIS — L821 Other seborrheic keratosis: Secondary | ICD-10-CM | POA: Diagnosis not present

## 2017-03-08 DIAGNOSIS — X32XXXA Exposure to sunlight, initial encounter: Secondary | ICD-10-CM | POA: Diagnosis not present

## 2017-03-08 DIAGNOSIS — Z08 Encounter for follow-up examination after completed treatment for malignant neoplasm: Secondary | ICD-10-CM | POA: Diagnosis not present

## 2017-03-08 DIAGNOSIS — Z85828 Personal history of other malignant neoplasm of skin: Secondary | ICD-10-CM | POA: Diagnosis not present

## 2017-03-15 DIAGNOSIS — E039 Hypothyroidism, unspecified: Secondary | ICD-10-CM | POA: Diagnosis not present

## 2017-03-15 DIAGNOSIS — E1165 Type 2 diabetes mellitus with hyperglycemia: Secondary | ICD-10-CM | POA: Diagnosis not present

## 2017-03-15 DIAGNOSIS — Z23 Encounter for immunization: Secondary | ICD-10-CM | POA: Diagnosis not present

## 2017-03-15 DIAGNOSIS — I10 Essential (primary) hypertension: Secondary | ICD-10-CM | POA: Diagnosis not present

## 2017-03-15 DIAGNOSIS — G4700 Insomnia, unspecified: Secondary | ICD-10-CM | POA: Diagnosis not present

## 2017-03-15 DIAGNOSIS — E782 Mixed hyperlipidemia: Secondary | ICD-10-CM | POA: Diagnosis not present

## 2017-03-15 DIAGNOSIS — Z0001 Encounter for general adult medical examination with abnormal findings: Secondary | ICD-10-CM | POA: Diagnosis not present

## 2017-03-21 DIAGNOSIS — Z0001 Encounter for general adult medical examination with abnormal findings: Secondary | ICD-10-CM | POA: Diagnosis not present

## 2017-03-21 DIAGNOSIS — R972 Elevated prostate specific antigen [PSA]: Secondary | ICD-10-CM | POA: Diagnosis not present

## 2017-03-21 DIAGNOSIS — E782 Mixed hyperlipidemia: Secondary | ICD-10-CM | POA: Diagnosis not present

## 2017-03-21 DIAGNOSIS — Z125 Encounter for screening for malignant neoplasm of prostate: Secondary | ICD-10-CM | POA: Diagnosis not present

## 2017-03-21 DIAGNOSIS — E1165 Type 2 diabetes mellitus with hyperglycemia: Secondary | ICD-10-CM | POA: Diagnosis not present

## 2017-05-04 ENCOUNTER — Ambulatory Visit (INDEPENDENT_AMBULATORY_CARE_PROVIDER_SITE_OTHER): Payer: Medicare Other

## 2017-05-04 DIAGNOSIS — G4733 Obstructive sleep apnea (adult) (pediatric): Secondary | ICD-10-CM

## 2017-05-05 ENCOUNTER — Other Ambulatory Visit: Payer: Self-pay | Admitting: Internal Medicine

## 2017-05-17 ENCOUNTER — Other Ambulatory Visit: Payer: Self-pay | Admitting: Internal Medicine

## 2017-05-24 ENCOUNTER — Ambulatory Visit
Admission: RE | Admit: 2017-05-24 | Discharge: 2017-05-24 | Disposition: A | Discharge status: Home / Self Care | Payer: Medicare Other | Source: Ambulatory Visit | Attending: Gastroenterology | Admitting: Gastroenterology

## 2017-05-24 ENCOUNTER — Encounter
Admission: RE | Disposition: A | Discharge status: Home / Self Care | Payer: Self-pay | Source: Ambulatory Visit | Attending: Gastroenterology

## 2017-05-24 ENCOUNTER — Ambulatory Visit: Payer: Medicare Other | Admitting: Anesthesiology

## 2017-05-24 ENCOUNTER — Encounter: Payer: Self-pay | Admitting: Anesthesiology

## 2017-05-24 DIAGNOSIS — Z7984 Long term (current) use of oral hypoglycemic drugs: Secondary | ICD-10-CM | POA: Insufficient documentation

## 2017-05-24 DIAGNOSIS — I2582 Chronic total occlusion of coronary artery: Secondary | ICD-10-CM | POA: Insufficient documentation

## 2017-05-24 DIAGNOSIS — Z880 Allergy status to penicillin: Secondary | ICD-10-CM | POA: Diagnosis not present

## 2017-05-24 DIAGNOSIS — K5730 Diverticulosis of large intestine without perforation or abscess without bleeding: Secondary | ICD-10-CM | POA: Insufficient documentation

## 2017-05-24 DIAGNOSIS — Z85810 Personal history of malignant neoplasm of tongue: Secondary | ICD-10-CM | POA: Insufficient documentation

## 2017-05-24 DIAGNOSIS — K599 Functional intestinal disorder, unspecified: Secondary | ICD-10-CM | POA: Diagnosis not present

## 2017-05-24 DIAGNOSIS — Z1211 Encounter for screening for malignant neoplasm of colon: Secondary | ICD-10-CM | POA: Diagnosis not present

## 2017-05-24 DIAGNOSIS — I10 Essential (primary) hypertension: Secondary | ICD-10-CM | POA: Diagnosis not present

## 2017-05-24 DIAGNOSIS — E039 Hypothyroidism, unspecified: Secondary | ICD-10-CM | POA: Insufficient documentation

## 2017-05-24 DIAGNOSIS — Z8619 Personal history of other infectious and parasitic diseases: Secondary | ICD-10-CM | POA: Insufficient documentation

## 2017-05-24 DIAGNOSIS — Z7902 Long term (current) use of antithrombotics/antiplatelets: Secondary | ICD-10-CM | POA: Diagnosis not present

## 2017-05-24 DIAGNOSIS — E119 Type 2 diabetes mellitus without complications: Secondary | ICD-10-CM | POA: Insufficient documentation

## 2017-05-24 DIAGNOSIS — G4733 Obstructive sleep apnea (adult) (pediatric): Secondary | ICD-10-CM | POA: Insufficient documentation

## 2017-05-24 DIAGNOSIS — M1990 Unspecified osteoarthritis, unspecified site: Secondary | ICD-10-CM | POA: Insufficient documentation

## 2017-05-24 DIAGNOSIS — Z79899 Other long term (current) drug therapy: Secondary | ICD-10-CM | POA: Insufficient documentation

## 2017-05-24 DIAGNOSIS — Z7982 Long term (current) use of aspirin: Secondary | ICD-10-CM | POA: Diagnosis not present

## 2017-05-24 DIAGNOSIS — E785 Hyperlipidemia, unspecified: Secondary | ICD-10-CM | POA: Insufficient documentation

## 2017-05-24 DIAGNOSIS — K559 Vascular disorder of intestine, unspecified: Secondary | ICD-10-CM | POA: Diagnosis not present

## 2017-05-24 DIAGNOSIS — K5790 Diverticulosis of intestine, part unspecified, without perforation or abscess without bleeding: Secondary | ICD-10-CM | POA: Diagnosis not present

## 2017-05-24 DIAGNOSIS — I2510 Atherosclerotic heart disease of native coronary artery without angina pectoris: Secondary | ICD-10-CM | POA: Insufficient documentation

## 2017-05-24 HISTORY — PX: COLONOSCOPY WITH PROPOFOL: SHX5780

## 2017-05-24 LAB — GLUCOSE, CAPILLARY: Glucose-Capillary: 104 mg/dL — ABNORMAL HIGH (ref 65–99)

## 2017-05-24 SURGERY — COLONOSCOPY WITH PROPOFOL
Anesthesia: General

## 2017-05-24 MED ORDER — PROPOFOL 500 MG/50ML IV EMUL
INTRAVENOUS | Status: DC | PRN
  Administered 2017-05-24: 120 ug/kg/min via INTRAVENOUS

## 2017-05-24 MED ORDER — LIDOCAINE HCL (PF) 1 % IJ SOLN
2.0000 mL | Freq: Once | INTRAMUSCULAR | Status: AC
  Administered 2017-05-24: 0.3 mL via INTRADERMAL

## 2017-05-24 MED ORDER — PROPOFOL 10 MG/ML IV BOLUS
INTRAVENOUS | Status: DC | PRN
  Administered 2017-05-24: 30 mg via INTRAVENOUS
  Administered 2017-05-24: 70 mg via INTRAVENOUS

## 2017-05-24 MED ORDER — FENTANYL CITRATE (PF) 100 MCG/2ML IJ SOLN: 25.0000 ug | INTRAMUSCULAR | Status: DC | PRN

## 2017-05-24 MED ORDER — ONDANSETRON HCL 4 MG/2ML IJ SOLN: 4.0000 mg | Freq: Once | INTRAMUSCULAR | Status: DC | PRN

## 2017-05-24 MED ORDER — LIDOCAINE 2% (20 MG/ML) 5 ML SYRINGE
INTRAMUSCULAR | Status: DC | PRN
  Administered 2017-05-24: 25 mg via INTRAVENOUS

## 2017-05-24 MED ORDER — LIDOCAINE HCL (PF) 1 % IJ SOLN
INTRAMUSCULAR | Status: AC
  Administered 2017-05-24: 0.3 mL via INTRADERMAL
  Filled 2017-05-24: qty 2

## 2017-05-24 MED ORDER — SODIUM CHLORIDE 0.9 % IV SOLN
INTRAVENOUS | Status: DC
  Administered 2017-05-24: 13:00:00 via INTRAVENOUS

## 2017-05-24 MED ORDER — SODIUM CHLORIDE 0.9 % IV SOLN
INTRAVENOUS | Status: DC
  Administered 2017-05-24: 1000 mL via INTRAVENOUS

## 2017-05-24 MED ORDER — PROPOFOL 500 MG/50ML IV EMUL
INTRAVENOUS | Status: AC
  Filled 2017-05-24: qty 50

## 2017-05-24 NOTE — Anesthesia Postprocedure Evaluation (Signed)
Anesthesia Post Note  Patient: Tony Lawrence  Procedure(s) Performed: COLONOSCOPY WITH PROPOFOL (N/A )  Patient location during evaluation: PACU Anesthesia Type: General Level of consciousness: awake and alert and oriented Pain management: pain level controlled Vital Signs Assessment: post-procedure vital signs reviewed and stable Respiratory status: spontaneous breathing Cardiovascular status: blood pressure returned to baseline Anesthetic complications: no     Last Vitals:  Vitals:   05/24/17 1340 05/24/17 1343  BP:  110/65  Pulse: 60 (!) 58  Resp: 20 18  Temp:    SpO2: 96% 97%    Last Pain:  Vitals:   05/24/17 1323  TempSrc: Tympanic                 Dimarco Minkin

## 2017-05-24 NOTE — Op Note (Signed)
[facility] Patient Name: Tony Lawrence Procedure Date: 05/24/2017 12:35 PM MRN: [id number] Account #: [id number] Date of Birth: November 17, 1945 Admit Type: Outpatient Age: 72 Room: [facility] ENDO ROOM 3 Gender: Male Note Status: Finalized Procedure:            Colonoscopy Indications:          Screening for colorectal malignant neoplasm Providers:            Lollie Sails, MD Referring MD:         Lavera Guise, MD (Referring MD) Medicines:            Monitored Anesthesia Care Complications:        No immediate complications. Procedure:            Pre-Anesthesia Assessment:                       - ASA Grade Assessment: III - A patient with severe                        systemic disease.                       After obtaining informed consent, the colonoscope was                        passed under direct vision. Throughout the procedure,                        the patient's blood pressure, pulse, and oxygen                        saturations were monitored continuously. The Olympus                        PCF-H180AL colonoscope ( S#: [id number] ) was introduced                        through the anus and advanced to the the cecum,                        identified by appendiceal orifice and ileocecal valve.                        The colonoscopy was performed with moderate difficulty                        due to significant looping. Successful completion of                        the procedure was aided by changing the patient to a                        supine position, changing the patient to a prone                        position and using manual pressure. The patient                        tolerated the procedure well. The quality of the bowel  preparation was good. Findings:      Multiple medium-mouthed diverticula were found in the sigmoid colon and       descending colon. It was difficult to see behind the IC valve, and this        was turned with a forcep to visualize.      Biopsies for histology were taken with a cold forceps from the right       colon and left colon for evaluation of microscopic colitis.      The retroflexed view of the distal rectum and anal verge was normal and       showed no anal or rectal abnormalities.      The digital rectal exam was normal. Impression:           - Diverticulosis in the sigmoid colon and in the                        descending colon.                       - The distal rectum and anal verge are normal on                        retroflexion view.                       - Biopsies were taken with a cold forceps from the                        right colon and left colon for evaluation of                        microscopic colitis. Recommendation:       - Discharge patient to home.                       - Repeat colonoscopy in 10 years for screening purposes. Procedure Code(s):    --- Professional ---                       [phone], Colonoscopy, flexible; with biopsy, single or                        multiple Diagnosis Code(s):    --- Professional ---                       Z12.11, Encounter for screening for malignant neoplasm                        of colon                       K57.30, Diverticulosis of large intestine without                        perforation or abscess without bleeding CPT copyright 2016 American Medical Association. All rights reserved. The codes documented in this report are preliminary and upon coder review may  be revised to meet current compliance requirements. Lollie Sails, MD 05/24/2017 1:25:56 PM This report has been signed electronically. Number of Addenda: 0 Note Initiated On: 05/24/2017 12:35 PM Scope Withdrawal Time: 0 hours 20 minutes 34 seconds  Total Procedure Duration: 0 hours 34 minutes 50 seconds       [facility]

## 2017-05-24 NOTE — Anesthesia Post-op Follow-up Note (Signed)
Anesthesia QCDR form completed.        

## 2017-05-24 NOTE — H&P (Addendum)
Outpatient short stay form Pre-procedure 05/24/2017 12:20 PM Lollie Sails MD  Primary Physician: Dr Clayborn Bigness  Reason for visit:  colonoscopy  History of present illness:  patient is a 72 year old male presenting today as above. His last colonoscopy was about 10 years ago.procedure was apparently negative at that time. Patient does take Plavix and aspirin and has held the Plavix for 6 days. He is not taking the aspirin for 2. He does not take any other aspirin product or blood thinning agent. He does use CPAP 4 OSA.    Current Facility-Administered Medications:  .  lidocaine (PF) (XYLOCAINE) 1 % injection, , , ,  .  0.9 %  sodium chloride infusion, , Intravenous, Continuous, Lollie Sails, MD .  0.9 %  sodium chloride infusion, , Intravenous, Continuous, Lollie Sails, MD .  fentaNYL (SUBLIMAZE) injection 25 mcg, 25 mcg, Intravenous, Q5 min PRN, Alvin Critchley, MD .  lidocaine (PF) (XYLOCAINE) 1 % injection 2 mL, 2 mL, Intradermal, Once, Lollie Sails, MD .  ondansetron [facility]) injection 4 mg, 4 mg, Intravenous, Once PRN, Alvin Critchley, MD  Medications Prior to Admission  Medication Sig Dispense Refill Last Dose  . aspirin 81 MG tablet Take 81 mg by mouth daily.   05/22/2017  . atorvastatin (LIPITOR) 20 MG tablet TAKE ONE TABLET BY MOUTH DAILY 90 tablet 5   . bisoprolol-hydrochlorothiazide (ZIAC) 2.5-6.25 MG tablet TAKE ONE TABLET BY MOUTH DAILY 30 tablet 2 05/24/2017 at 0730  . clopidogrel (PLAVIX) 75 MG tablet Take 1 tablet (75 mg total) by mouth daily. 90 tablet 3 05/18/2017  . glimepiride (AMARYL) 2 MG tablet TAKE ONE-HALF TABLET BY MOUTH EVERY MORNING AND TAKE ONE-HALF TABLET BY MOUTH WITH SUPPER 90 tablet 4   . isosorbide mononitrate (IMDUR) 30 MG 24 hr tablet TAKE ONE TABLET BY MOUTH TWICE A DAY 180 tablet 3 05/24/2017 at 0730  . JARDIANCE 25 MG TABS tablet TAKE ONE TABLET BY MOUTH DAILY 90 tablet 2   . levothyroxine (SYNTHROID) 200 MCG tablet Take 200 mcg by mouth  daily.   Taking  . liothyronine (CYTOMEL) 5 MCG tablet Take 5 mcg by mouth every other day.    Taking  . meloxicam (MOBIC) 7.5 MG tablet Take 7.5 mg by mouth daily.     . metFORMIN (GLUCOPHAGE) 1000 MG tablet Take 1,000 mg by mouth 2 (two) times daily.   Taking  . Multiple Vitamins-Minerals (CENTRUM SILVER 50+MEN PO) Take 1 tablet by mouth every morning.    Taking  . nitroGLYCERIN (NITROSTAT) 0.4 MG SL tablet Place 1 tablet (0.4 mg total) under the tongue every 5 (five) minutes as needed. 25 tablet 6 Taking  . omeprazole (PRILOSEC) 40 MG capsule Take 40 mg by mouth daily.   Taking  . ramipril (ALTACE) 10 MG capsule Take 1 capsule (10 mg total) by mouth daily. 90 capsule 3   . zolpidem (AMBIEN) 10 MG tablet Take 10 mg by mouth at bedtime.        Allergies  Allergen Reactions  . Penicillins Hives and Rash    Has patient had a PCN reaction causing immediate rash, facial/tongue/throat swelling, SOB or lightheadedness with hypotension: Yes Has patient had a PCN reaction causing severe rash involving mucus membranes or skin necrosis: No Has patient had a PCN reaction that required hospitalization No Has patient had a PCN reaction occurring within the last 10 years: No If all of the above answers are "NO", then may proceed with Cephalosporin use.  Past Medical History:  Diagnosis Date  . Arthritis   . Complication of anesthesia    SEVERE SORE THROAT AFTER BIOPSY 2010  . Coronary artery disease    Chronic total occlusion of mid LAD  . Diabetes mellitus without complication ([place])   . History of shingles   . Hyperlipidemia   . Hypertension   . Hypothyroidism   . OSA on CPAP   . 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 I have discussed the risks benefits and complications of procedures to include not limited to bleeding, infection, perforation and the risk of sedation and the patient wishes to proceed.    Lollie Sails, MD Gastroenterology 05/24/2017  12:20 PM

## 2017-05-24 NOTE — Anesthesia Preprocedure Evaluation (Signed)
Anesthesia Evaluation  Patient identified by MRN, date of birth, ID band Patient awake    Reviewed: Allergy & Precautions, NPO status , Patient's Chart, lab work & pertinent test results  History of Anesthesia Complications (+) history of anesthetic complications  Airway Mallampati: IV       Dental  (+) Teeth Intact   Pulmonary sleep apnea and Continuous Positive Airway Pressure Ventilation ,    Pulmonary exam normal        Cardiovascular hypertension, + CAD   Rhythm:regular Rate:Normal     Neuro/Psych  Neuromuscular disease negative psych ROS   GI/Hepatic negative GI ROS, Neg liver ROS,   Endo/Other  diabetesHypothyroidism   Renal/GU negative Renal ROS  negative genitourinary   Musculoskeletal  (+) Arthritis , Osteoarthritis,    Abdominal Normal abdominal exam  (+)   Peds negative pediatric ROS (+)  Hematology   Anesthesia Other Findings   Reproductive/Obstetrics                             Anesthesia Physical  Anesthesia Plan  ASA: III  Anesthesia Plan:    Post-op Pain Management:    Induction: Intravenous  PONV Risk Score and Plan:   Airway Management Planned: Nasal Cannula  Additional Equipment:   Intra-op Plan:   Post-operative Plan:   Informed Consent: I have reviewed the patients History and Physical, chart, labs and discussed the procedure including the risks, benefits and alternatives for the proposed anesthesia with the patient or authorized representative who has indicated his/her understanding and acceptance.   Dental advisory given  Plan Discussed with: CRNA and Surgeon  Anesthesia Plan Comments: (Nasotracheal intubation planned)        Anesthesia Quick Evaluation

## 2017-05-24 NOTE — Transfer of Care (Signed)
Immediate Anesthesia Transfer of Care Note  Patient: Tony Lawrence  Procedure(s) Performed: COLONOSCOPY WITH PROPOFOL (N/A )  Patient Location: Endoscopy Unit  Anesthesia Type:General  Level of Consciousness: awake  Airway & Oxygen Therapy: Patient Spontanous Breathing and Patient connected to face mask oxygen  Post-op Assessment: Report given to RN and Post -op Vital signs reviewed and stable  Post vital signs: Reviewed  Last Vitals:  Vitals:   05/24/17 1203 05/24/17 1323  BP: 139/78 124/90  Pulse: 71 72  Resp: 17 (!) 21  Temp: (!) 35.6 C (!) 36.4 C  SpO2: 98% 97%    Last Pain:  Vitals:   05/24/17 1323  TempSrc: Tympanic         Complications: No apparent anesthesia complications

## 2017-05-25 ENCOUNTER — Encounter: Payer: Self-pay | Admitting: Gastroenterology

## 2017-05-26 LAB — SURGICAL PATHOLOGY

## 2017-06-01 DIAGNOSIS — E119 Type 2 diabetes mellitus without complications: Secondary | ICD-10-CM | POA: Diagnosis not present

## 2017-06-06 NOTE — Progress Notes (Signed)
95 percentile pressure 6.7   95th percentile leak 17.9   apnea index 1.1 /hr  apnea-hypopnea index  3.4 /hr   total days used  >4 hr 179 days  total days used <4 hr 0 days  Total compliance 100 percent

## 2017-06-20 ENCOUNTER — Other Ambulatory Visit: Payer: Self-pay

## 2017-06-20 MED ORDER — ISOSORBIDE MONONITRATE ER 30 MG PO TB24: 30.0000 mg | ORAL_TABLET | Freq: Two times a day (BID) | ORAL | 3 refills | Status: DC

## 2017-07-05 ENCOUNTER — Other Ambulatory Visit: Payer: Self-pay | Admitting: Internal Medicine

## 2017-07-05 ENCOUNTER — Encounter: Payer: Self-pay | Admitting: Internal Medicine

## 2017-07-05 ENCOUNTER — Ambulatory Visit (INDEPENDENT_AMBULATORY_CARE_PROVIDER_SITE_OTHER): Payer: Medicare Other | Admitting: Internal Medicine

## 2017-07-05 VITALS — BP 140/80 | HR 82 | Resp 16 | Ht 70.0 in | Wt 265.0 lb

## 2017-07-05 DIAGNOSIS — E782 Mixed hyperlipidemia: Secondary | ICD-10-CM

## 2017-07-05 DIAGNOSIS — E039 Hypothyroidism, unspecified: Secondary | ICD-10-CM

## 2017-07-05 DIAGNOSIS — M158 Other polyosteoarthritis: Secondary | ICD-10-CM

## 2017-07-05 DIAGNOSIS — E1165 Type 2 diabetes mellitus with hyperglycemia: Secondary | ICD-10-CM

## 2017-07-05 LAB — POCT GLYCOSYLATED HEMOGLOBIN (HGB A1C): HEMOGLOBIN A1C: 5.9

## 2017-07-05 MED ORDER — ZOLPIDEM TARTRATE 10 MG PO TABS: 10.0000 mg | ORAL_TABLET | Freq: Every day | ORAL | 5 refills | Status: DC

## 2017-07-05 NOTE — Progress Notes (Signed)
[facility] [place],  [address]  Internal MEDICINE  Office Visit Note  Patient Name: Tony Lawrence  [id number]  [id number]  Date of Service: 08/01/2017  Chief Complaint  Patient presents with  . Diabetes  . Hypothyroidism  . Hypertension    HPI  Pt is here for routine follow up. Pt is feeling well. Just had his colonoscopy. Diabetes is under good control. Target lipid profile.   Current Medication: Outpatient Encounter Medications as of 07/05/2017  Medication Sig  . aspirin 81 MG tablet Take 81 mg by mouth daily.  Marland Kitchen atorvastatin (LIPITOR) 20 MG tablet TAKE ONE TABLET BY MOUTH DAILY  . clopidogrel (PLAVIX) 75 MG tablet Take 1 tablet (75 mg total) by mouth daily.  . isosorbide mononitrate (IMDUR) 30 MG 24 hr tablet Take 1 tablet (30 mg total) by mouth 2 (two) times daily.  Marland Kitchen JARDIANCE 25 MG TABS tablet TAKE ONE TABLET BY MOUTH DAILY  . levothyroxine (SYNTHROID) 200 MCG tablet Take 200 mcg by mouth daily.  Marland Kitchen liothyronine (CYTOMEL) 5 MCG tablet Take 5 mcg by mouth every other day.   . metFORMIN (GLUCOPHAGE) 1000 MG tablet Take 1,000 mg by mouth 2 (two) times daily.  . Multiple Vitamins-Minerals (CENTRUM SILVER 50+MEN PO) Take 1 tablet by mouth every morning.   . nitroGLYCERIN (NITROSTAT) 0.4 MG SL tablet Place 1 tablet (0.4 mg total) under the tongue every 5 (five) minutes as needed.  Marland Kitchen omeprazole (PRILOSEC) 40 MG capsule Take 40 mg by mouth daily.  . ramipril (ALTACE) 10 MG capsule Take 1 capsule (10 mg total) by mouth daily.  . [DISCONTINUED] bisoprolol-hydrochlorothiazide (ZIAC) 2.5-6.25 MG tablet TAKE ONE TABLET BY MOUTH DAILY  . [DISCONTINUED] glimepiride (AMARYL) 2 MG tablet TAKE ONE-HALF TABLET BY MOUTH EVERY MORNING AND TAKE ONE-HALF TABLET BY MOUTH WITH SUPPER  . [DISCONTINUED] zolpidem (AMBIEN) 10 MG tablet Take 10 mg by mouth at bedtime.  . [DISCONTINUED] meloxicam (MOBIC) 7.5 MG tablet Take 7.5 mg by mouth daily.   No  facility-administered encounter medications on file as of 07/05/2017.     Surgical History: Past Surgical History:  Procedure Laterality Date  . BIOPSY TONGUE    . CARDIAC CATHETERIZATION N/A 11/10/2015   Procedure: Left Heart Cath and Coronary Angiography;  Surgeon: Wellington Hampshire, MD;  Location: [facility];  Service: Cardiovascular;  Laterality: N/A;  . COLONOSCOPY  05/2006  . COLONOSCOPY WITH PROPOFOL N/A 05/24/2017   Procedure: COLONOSCOPY WITH PROPOFOL;  Surgeon: Lollie Sails, MD;  Location: [facility] ENDOSCOPY;  Service: Endoscopy;  Laterality: N/A;  . EXCISION OF TONGUE LESION N/A 11/11/2015   Procedure: EXCISION OF TONGUE LESION/ WITH FROZEN SECTION;  Surgeon: Beverly Gust, MD;  Location: [facility] ORS;  Service: ENT;  Laterality: N/A;    Medical History: Past Medical History:  Diagnosis Date  . Arthritis   . Complication of anesthesia    SEVERE SORE THROAT AFTER BIOPSY 2010  . Coronary artery disease    Chronic total occlusion of mid LAD  . Diabetes mellitus without complication ([place])   . History of shingles   . Hyperlipidemia   . Hypertension   . Hypothyroidism   . OSA on CPAP   . Tongue cancer ([place])    Squamous cell CA of tongue    Family History: Family History  Problem Relation Age of Onset  . Myelodysplastic syndrome Mother   . Emphysema Father     Social History   Socioeconomic History  . Marital status:  Married    Spouse name: Not on file  . Number of children: Not on file  . Years of education: Not on file  . Highest education level: Not on file  Occupational History  . Not on file  Social Needs  . Financial resource strain: Not on file  . Food insecurity:    Worry: Not on file    Inability: Not on file  . Transportation needs:    Medical: Not on file    Non-medical: Not on file  Tobacco Use  . Smoking status: Never Smoker  . Smokeless tobacco: Never Used  Substance and Sexual Activity  . Alcohol use: Yes    Alcohol/week: 0.0 oz     Comment: occassionally   . Drug use: No  . Sexual activity: Not on file  Lifestyle  . Physical activity:    Days per week: Not on file    Minutes per session: Not on file  . Stress: Not on file  Relationships  . Social connections:    Talks on phone: Not on file    Gets together: Not on file    Attends religious service: Not on file    Active member of club or organization: Not on file    Attends meetings of clubs or organizations: Not on file    Relationship status: Not on file  . Intimate partner violence:    Fear of current or ex partner: Not on file    Emotionally abused: Not on file    Physically abused: Not on file    Forced sexual activity: Not on file  Other Topics Concern  . Not on file  Social History Narrative  . Not on file   Review of Systems  Constitutional: Negative for chills, fatigue and unexpected weight change.  HENT: Negative for congestion, postnasal drip, rhinorrhea, sneezing and sore throat.   Eyes: Negative for redness.  Respiratory: Negative for cough, chest tightness and shortness of breath.   Cardiovascular: Negative for chest pain and palpitations.  Gastrointestinal: Negative for abdominal pain, constipation, diarrhea, nausea and vomiting.  Genitourinary: Negative for dysuria and frequency.  Musculoskeletal: Negative for arthralgias, back pain, joint swelling and neck pain.  Skin: Negative for rash.  Neurological: Negative.  Negative for tremors and numbness.  Hematological: Negative for adenopathy. Does not bruise/bleed easily.  Psychiatric/Behavioral: Negative for behavioral problems (Depression), sleep disturbance and suicidal ideas. The patient is not nervous/anxious.     Vital Signs: BP 140/80   Pulse 82   Resp 16   Ht 5\' 10"  (1.778 m)   Wt 265 lb (120.2 kg)   SpO2 96%   BMI 38.02 kg/m    Physical Exam  Constitutional: He is oriented to person, place, and time. He appears well-developed and well-nourished. No distress.  HENT:   Head: Normocephalic and atraumatic.  Mouth/Throat: Oropharynx is clear and moist. No oropharyngeal exudate.  Eyes: Pupils are equal, round, and reactive to light. EOM are normal.  Neck: Normal range of motion. Neck supple. No JVD present. No tracheal deviation present. No thyromegaly present.  Cardiovascular: Normal rate, regular rhythm and normal heart sounds. Exam reveals no gallop and no friction rub.  No murmur heard. Pulmonary/Chest: Effort normal. No respiratory distress. He has no wheezes. He has no rales. He exhibits no tenderness.  Abdominal: Soft. Bowel sounds are normal.  Musculoskeletal: Normal range of motion.  Lymphadenopathy:    He has no cervical adenopathy.  Neurological: He is alert and oriented to person, place, and time.  No cranial nerve deficit.  Skin: Skin is warm and dry. He is not diaphoretic.  Psychiatric: He has a normal mood and affect. His behavior is normal. Judgment and thought content normal.   Assessment/Plan: 1. Uncontrolled type 2 diabetes mellitus with hyperglycemia (HCC) - POCT HgB A1C ( 5.2) stable, continue all meds as before   2. Mixed hyperlipidemia - Controlled   3. Other osteoarthritis involving multiple joints - Stable   4. Hypothyroidism, unspecified type - Continue Synthroid   General Counseling: Ruel verbalizes understanding of the findings of todays visit and agrees with plan of treatment. I have discussed any further diagnostic evaluation that may be needed or ordered today. We also reviewed his medications today. he has been encouraged to call the office with any questions or concerns that should arise related to todays visit. Cardiac risk factor modification:  1. Control blood pressure. 2. Exercise as prescribed. 3. Follow low sodium, low fat diet. and low fat and low cholestrol diet. 4. Take ASA 81mg  once a day.   Orders Placed This Encounter  Procedures  . Pneumococcal conjugate vaccine 13-valent IM  . POCT HgB A1C      Time spent:25 Minutes   Dr Lavera Guise Internal medicine

## 2017-07-11 IMAGING — PT NM PET TUM IMG INITIAL (PI) SKULL BASE T - THIGH
1 of 10 series · 1 of 25 positions shown · non-contrast
Comparison: None.

CLINICAL DATA: Initial treatment strategy for tongue cancer post
biopsy 2 weeks ago.

EXAM:
NUCLEAR MEDICINE PET SKULL BASE TO THIGH
TECHNIQUE: 13.2 MCi F-18 FDG was injected intravenously. Full-ring PET imaging
was performed from the skull base to thigh after the radiotracer. CT
data was obtained and used for attenuation correction and anatomic
localization.
FASTING BLOOD GLUCOSE:  Value: 140 mg/dl

[Series 3: ct wb 5.0 b30f · axial · 5.0mm · 0.98mm/px · 1 of 329 slices shown]
[im 329/329  brain]
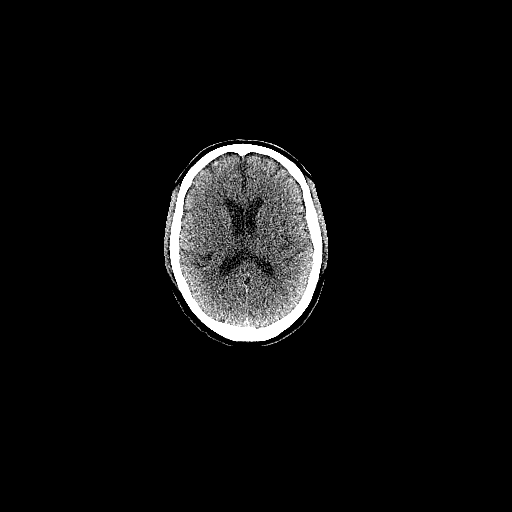

[1 of 25 positions shown; findings below may reference images not displayed]

FINDINGS: NECK

No hypermetabolic cervical lymph nodes are identified.There are no
lesions of the pharyngeal mucosal space. The base of tongue appears
unremarkable.

CHEST

There are no hypermetabolic mediastinal, hilar or axillary lymph
nodes. There is no hypermetabolic pulmonary activity. Multiple
calcified mediastinal and hilar lymph nodes are present. There is
diffuse atherosclerosis of the coronary arteries and to a lesser
degree the aorta and great vessels.

ABDOMEN/PELVIS

There is no hypermetabolic activity within the liver, adrenal
glands, spleen or pancreas. There is no hypermetabolic nodal
activity. There is mildly prominent focal activity within the mid
rectum (SUV max 10.8). No obvious mucosal lesion is seen in this
area on the CT images. Incidental findings include the presence of
multiple calcified granulomas in the spleen, diffuse hepatic
steatosis, right renal sinus cysts, colonic diverticulosis and
aortoiliac atherosclerosis.

SKELETON

There is no hypermetabolic activity to suggest osseous metastatic
disease. Lumbar spondylosis noted.
IMPRESSION: 1. There is no abnormal activity within the patient's tongue cancer
or cervical lymph nodes. No evidence of metastatic disease.
2. Indeterminate activity in the rectum, potentially physiologic or
inflammatory. If the patient has not had routine colonoscopy,
further evaluation recommended.
3. Incidental findings as described, including aortic
atherosclerosis, sequela of prior granulomatous disease and hepatic
steatosis.

## 2017-07-18 ENCOUNTER — Other Ambulatory Visit: Payer: Self-pay | Admitting: Internal Medicine

## 2017-08-17 ENCOUNTER — Other Ambulatory Visit: Payer: Self-pay | Admitting: Internal Medicine

## 2017-09-05 DIAGNOSIS — D3709 Neoplasm of uncertain behavior of other specified sites of the oral cavity: Secondary | ICD-10-CM | POA: Diagnosis not present

## 2017-09-05 DIAGNOSIS — Z85810 Personal history of malignant neoplasm of tongue: Secondary | ICD-10-CM | POA: Diagnosis not present

## 2017-09-05 DIAGNOSIS — D3702 Neoplasm of uncertain behavior of tongue: Secondary | ICD-10-CM | POA: Diagnosis not present

## 2017-11-02 ENCOUNTER — Ambulatory Visit (INDEPENDENT_AMBULATORY_CARE_PROVIDER_SITE_OTHER): Payer: Medicare Other

## 2017-11-02 DIAGNOSIS — G4733 Obstructive sleep apnea (adult) (pediatric): Secondary | ICD-10-CM | POA: Diagnosis not present

## 2017-11-02 NOTE — Progress Notes (Signed)
95 percentile pressure 10   95th percentile leak 13.3   apnea index 0.2 /hr  apnea-hypopnea index  0.7 /hr   total days used  >4 hr 90 days  total days used <4 hr 0 days  Total compliance 100 percent  Tony Lawrence doing great. No problems

## 2017-11-08 DIAGNOSIS — D3702 Neoplasm of uncertain behavior of tongue: Secondary | ICD-10-CM | POA: Diagnosis not present

## 2017-11-09 DIAGNOSIS — C029 Malignant neoplasm of tongue, unspecified: Secondary | ICD-10-CM | POA: Diagnosis not present

## 2017-11-09 DIAGNOSIS — Z6838 Body mass index (BMI) 38.0-38.9, adult: Secondary | ICD-10-CM | POA: Diagnosis not present

## 2017-12-16 DIAGNOSIS — C029 Malignant neoplasm of tongue, unspecified: Secondary | ICD-10-CM | POA: Diagnosis not present

## 2017-12-16 DIAGNOSIS — Z6838 Body mass index (BMI) 38.0-38.9, adult: Secondary | ICD-10-CM | POA: Diagnosis not present

## 2017-12-21 ENCOUNTER — Ambulatory Visit (INDEPENDENT_AMBULATORY_CARE_PROVIDER_SITE_OTHER): Payer: Medicare Other | Admitting: Internal Medicine

## 2017-12-21 ENCOUNTER — Encounter: Payer: Self-pay | Admitting: Internal Medicine

## 2017-12-21 VITALS — BP 140/70 | HR 58 | Ht 70.0 in | Wt 259.5 lb

## 2017-12-21 DIAGNOSIS — I10 Essential (primary) hypertension: Secondary | ICD-10-CM

## 2017-12-21 DIAGNOSIS — Z79899 Other long term (current) drug therapy: Secondary | ICD-10-CM | POA: Diagnosis not present

## 2017-12-21 DIAGNOSIS — I25118 Atherosclerotic heart disease of native coronary artery with other forms of angina pectoris: Secondary | ICD-10-CM

## 2017-12-21 DIAGNOSIS — E785 Hyperlipidemia, unspecified: Secondary | ICD-10-CM

## 2017-12-21 MED ORDER — RAMIPRIL 10 MG PO CAPS: 20.0000 mg | ORAL_CAPSULE | Freq: Every day | ORAL | 3 refills | Status: DC

## 2017-12-21 NOTE — Progress Notes (Signed)
Follow-up Outpatient Visit Date: 12/21/2017  Primary Care Provider: Lavera Guise, MD [address] [place] [address]  Chief Complaint: Follow-up CAD  HPI:  Tony Lawrence is a 72 y.o. year-old male with history of coronary artery disease with chronic total occlusion of the mid LAD, hypertension, hyperlipidemia, diabetes mellitus, obstructive sleep apnea, and hypothyroidism, who presents for follow-up of coronary artery disease.  I last saw him a year ago, at which time he was doing well other than chronic knee and low back pain.  We did not make any medication changes at that time.  Today, Tony Lawrence reports that he has been feeling well other than his chronic low back and knee pain.  He denies chest pain, shortness of breath, palpitations, lightheadedness, orthopnea, PND, and edema.  He is tolerating his current medications well.  He has not needed to use any sublingual NTG.  --------------------------------------------------------------------------------------------------  Past Medical History:  Diagnosis Date  . Arthritis   . Complication of anesthesia    SEVERE SORE THROAT AFTER BIOPSY 2010  . Coronary artery disease    Chronic total occlusion of mid LAD  . Diabetes mellitus without complication ([place])   . History of shingles   . Hyperlipidemia   . Hypertension   . Hypothyroidism   . OSA on CPAP   . Tongue cancer ([place])    Squamous cell CA of tongue   Past Surgical History:  Procedure Laterality Date  . BIOPSY TONGUE    . CARDIAC CATHETERIZATION N/A 11/10/2015   Procedure: Left Heart Cath and Coronary Angiography;  Surgeon: Wellington Hampshire, MD;  Location: [facility];  Service: Cardiovascular;  Laterality: N/A;  . COLONOSCOPY  05/2006  . COLONOSCOPY WITH PROPOFOL N/A 05/24/2017   Procedure: COLONOSCOPY WITH PROPOFOL;  Surgeon: Lollie Sails, MD;  Location: [facility] ENDOSCOPY;  Service: Endoscopy;  Laterality: N/A;  . EXCISION OF TONGUE LESION N/A 11/11/2015   Procedure: EXCISION OF TONGUE LESION/ WITH FROZEN SECTION;  Surgeon: Beverly Gust, MD;  Location: [facility] ORS;  Service: ENT;  Laterality: N/A;    Current Meds  Medication Sig  . aspirin 81 MG tablet Take 81 mg by mouth daily.  Marland Kitchen atorvastatin (LIPITOR) 20 MG tablet TAKE ONE TABLET BY MOUTH DAILY  . bisoprolol-hydrochlorothiazide (ZIAC) 2.5-6.25 MG tablet TAKE ONE TABLET BY MOUTH DAILY  . clopidogrel (PLAVIX) 75 MG tablet Take 1 tablet (75 mg total) by mouth daily.  Marland Kitchen glimepiride (AMARYL) 2 MG tablet TAKE ONE-HALF TABLET BY MOUTH EVERY MORNING AND TAKE ONE-HALF TABLET BY MOUTH WITH SUPPER  . isosorbide mononitrate (IMDUR) 30 MG 24 hr tablet Take 1 tablet (30 mg total) by mouth 2 (two) times daily.  Marland Kitchen JARDIANCE 25 MG TABS tablet TAKE ONE TABLET BY MOUTH DAILY  . levothyroxine (SYNTHROID) 200 MCG tablet Take 200 mcg by mouth daily.  Marland Kitchen liothyronine (CYTOMEL) 5 MCG tablet TAKE ONE TAB IN AM ALONG WITH LEVOTHYROXINE  . metFORMIN (GLUCOPHAGE) 1000 MG tablet Take 1,000 mg by mouth 2 (two) times daily.  . Multiple Vitamins-Minerals (CENTRUM SILVER 50+MEN PO) Take 1 tablet by mouth every morning.   Marland Kitchen omeprazole (PRILOSEC) 40 MG capsule Take 40 mg by mouth daily.  . ramipril (ALTACE) 10 MG capsule Take 1 capsule (10 mg total) by mouth daily.    Allergies: Penicillins  Social History   Tobacco Use  . Smoking status: Never Smoker  . Smokeless tobacco: Never Used  Substance Use Topics  . Alcohol use: Yes    Alcohol/week: 0.0  standard drinks    Comment: occassionally   . Drug use: No    Family History  Problem Relation Age of Onset  . Myelodysplastic syndrome Mother   . Emphysema Father     Review of Systems: A 12-system review of systems was performed and was negative except as noted in the HPI.  --------------------------------------------------------------------------------------------------  Physical Exam: BP 140/70 (BP Location: Left Arm, Patient Position: Sitting, Cuff Size:  Normal)   Pulse (!) 58   Ht 5\' 10"  (1.778 m)   Wt 259 lb 8 oz (117.7 kg)   BMI 37.23 kg/m   General:  NAD. HEENT: No conjunctival pallor or scleral icterus. Moist mucous membranes.  OP clear. Neck: Supple without lymphadenopathy, thyromegaly, JVD, or HJR. No carotid bruit. Lungs: Normal work of breathing. Clear to auscultation bilaterally without wheezes or crackles. Heart: Regular rate and rhythm without murmurs, rubs, or gallops. Unable to assess PMI due to body habitus. Abd: Bowel sounds present. Soft, NT/ND.  Unable to assess HSM due to body habitus. Ext: No lower extremity edema. Radial, PT, and DP pulses are 2+ bilaterally. Skin: Warm and dry without rash.  EKG:  Sinus bradycardia (HR 58 bpm) with left axis deviation.  Poor R wave progression in V1-V2 noted on prior EKG is no longer present.   Otherwise, no change since 12/02/16.  Lab Results  Component Value Date   WBC 8.4 11/04/2015   HGB 17.0 11/11/2015   HCT 50.0 11/11/2015   MCV 93.0 11/04/2015   PLT 161 11/04/2015    Lab Results  Component Value Date   NA 139 11/11/2015   K 4.2 11/11/2015   CL 99 (L) 11/04/2015   CO2 30 11/04/2015   BUN 16 11/04/2015   CREATININE 1.19 11/04/2015   GLUCOSE 171 (H) 11/11/2015   ALT 59 11/04/2015    No results found for: CHOL, HDL, LDLCALC, LDLDIRECT, TRIG, CHOLHDL  --------------------------------------------------------------------------------------------------  ASSESSMENT AND PLAN: Coronary artery disease with stable angina Symptoms well controlled with bisoprolol and isosorbide mononitrate in the setting of CTO of mid LAD.  Continue current antianginal therapy and secondary prevention, including ASA and clopidogrel, as tolerated.  Hypertension BP suboptimally controlled.  We will increase ramipril to 20 mg daily, with BMP check today and in ~2 weeks.  Further management per Dr. Humphrey Rolls, whom the patient is scheduled to see in the next 1-2 months for routine  follow-up.  Hyperlipidemia No recent lipid panel in our system.  Goal LDL < 70.  We will check a fasting lipid panel and ALT in ~2 weeks.  Atorvastatin may need to be escalated if LDL is not at goal.  Type 2 diabetes mellitus Continue current medications and follow-up with Dr. Humphrey Rolls.  Follow-up: Return to clinic in 1 year.  Nelva Bush, MD 12/21/2017 11:23 AM

## 2017-12-21 NOTE — Patient Instructions (Signed)
Medication Instructions:  Your physician has recommended you make the following change in your medication:  1- INCREASE Ramipril to 20 mg (2 tablets) by mouth once a day.   Labwork: Your physician recommends that you return for lab work in: [place]- BMET.   Your physician recommends that you return for lab work in: [place], ALT, LIPID PANEL. Please go to the [facility]. You will check in at the front desk to the right as you walk into the atrium. Valet Parking is offered if needed.  You will need to be FASTING.   Testing/Procedures: none  Follow-Up: Your physician recommends that you schedule a follow-up appointment in: [place].  If you need a refill on your cardiac medications before your next appointment, please call your pharmacy.

## 2017-12-22 ENCOUNTER — Encounter: Payer: Self-pay | Admitting: Internal Medicine

## 2017-12-22 DIAGNOSIS — E785 Hyperlipidemia, unspecified: Secondary | ICD-10-CM | POA: Insufficient documentation

## 2017-12-22 DIAGNOSIS — I25118 Atherosclerotic heart disease of native coronary artery with other forms of angina pectoris: Secondary | ICD-10-CM | POA: Insufficient documentation

## 2017-12-22 DIAGNOSIS — I10 Essential (primary) hypertension: Secondary | ICD-10-CM | POA: Insufficient documentation

## 2017-12-22 DIAGNOSIS — E1169 Type 2 diabetes mellitus with other specified complication: Secondary | ICD-10-CM | POA: Insufficient documentation

## 2017-12-22 HISTORY — DX: Essential (primary) hypertension: I10

## 2017-12-22 LAB — BASIC METABOLIC PANEL
BUN/Creatinine Ratio: 14 (ref 10–24)
BUN: 17 mg/dL (ref 8–27)
CALCIUM: 9.3 mg/dL (ref 8.6–10.2)
CHLORIDE: 101 mmol/L (ref 96–106)
CO2: 25 mmol/L (ref 20–29)
Creatinine, Ser: 1.24 mg/dL (ref 0.76–1.27)
GFR, EST AFRICAN AMERICAN: 67 mL/min/{1.73_m2} (ref >59)
GFR, EST NON AFRICAN AMERICAN: 58 mL/min/{1.73_m2} — AB (ref >59)
Glucose: 97 mg/dL (ref 65–99)
Potassium: 5 mmol/L (ref 3.5–5.2)
Sodium: 142 mmol/L (ref 134–144)

## 2018-01-02 ENCOUNTER — Encounter: Payer: Self-pay | Admitting: Internal Medicine

## 2018-01-02 ENCOUNTER — Other Ambulatory Visit: Payer: Self-pay | Admitting: Internal Medicine

## 2018-01-02 ENCOUNTER — Ambulatory Visit (INDEPENDENT_AMBULATORY_CARE_PROVIDER_SITE_OTHER): Payer: Medicare Other | Admitting: Internal Medicine

## 2018-01-02 VITALS — BP 138/80 | HR 62 | Resp 16 | Ht 70.0 in | Wt 259.0 lb

## 2018-01-02 DIAGNOSIS — I10 Essential (primary) hypertension: Secondary | ICD-10-CM

## 2018-01-02 DIAGNOSIS — I25118 Atherosclerotic heart disease of native coronary artery with other forms of angina pectoris: Secondary | ICD-10-CM

## 2018-01-02 DIAGNOSIS — G4733 Obstructive sleep apnea (adult) (pediatric): Secondary | ICD-10-CM | POA: Diagnosis not present

## 2018-01-02 NOTE — Patient Instructions (Signed)

## 2018-01-02 NOTE — Progress Notes (Signed)
[facility] [place], [place] [address]  Pulmonary Sleep Medicine   Office Visit Note  Patient Name: Tony Lawrence DOB: 03/18/1946 MRN [id number]  Date of Service: 01/02/2018  Complaints/HPI: Pt here for follow up to OSA with cpap.  He reports using his CPAP every night.  He reports his mask is fitting well, without leaks.  He reports cleaning CPAP with soap and water.  He denies cough, SOB, or sinus symptoms. His machine was purchased within the last year or two.   ROS  General: (-) fever, (-) chills, (-) night sweats, (-) weakness Skin: (-) rashes, (-) itching,. Eyes: (-) visual changes, (-) redness, (-) itching. Nose and Sinuses: (-) nasal stuffiness or itchiness, (-) postnasal drip, (-) nosebleeds, (-) sinus trouble. Mouth and Throat: (-) sore throat, (-) hoarseness. Neck: (-) swollen glands, (-) enlarged thyroid, (-) neck pain. Respiratory: - cough, (-) bloody sputum, - shortness of breath, - wheezing. Cardiovascular: - ankle swelling, (-) chest pain. Lymphatic: (-) lymph node enlargement. Neurologic: (-) numbness, (-) tingling. Psychiatric: (-) anxiety, (-) depression   Current Medication: Outpatient Encounter Medications as of 01/02/2018  Medication Sig  . aspirin 81 MG tablet Take 81 mg by mouth daily.  Marland Kitchen atorvastatin (LIPITOR) 20 MG tablet TAKE ONE TABLET BY MOUTH DAILY  . bisoprolol-hydrochlorothiazide (ZIAC) 2.5-6.25 MG tablet TAKE ONE TABLET BY MOUTH DAILY  . clopidogrel (PLAVIX) 75 MG tablet Take 1 tablet (75 mg total) by mouth daily.  Marland Kitchen glimepiride (AMARYL) 2 MG tablet TAKE ONE-HALF TABLET BY MOUTH EVERY MORNING AND TAKE ONE-HALF TABLET BY MOUTH WITH SUPPER  . isosorbide mononitrate (IMDUR) 30 MG 24 hr tablet Take 1 tablet (30 mg total) by mouth 2 (two) times daily.  Marland Kitchen JARDIANCE 25 MG TABS tablet TAKE ONE TABLET BY MOUTH DAILY  . levothyroxine (SYNTHROID) 200 MCG tablet Take 200 mcg by mouth daily.  Marland Kitchen liothyronine (CYTOMEL) 5 MCG tablet  TAKE ONE TAB IN AM ALONG WITH LEVOTHYROXINE  . metFORMIN (GLUCOPHAGE) 1000 MG tablet Take 1,000 mg by mouth 2 (two) times daily.  . Multiple Vitamins-Minerals (CENTRUM SILVER 50+MEN PO) Take 1 tablet by mouth every morning.   Marland Kitchen omeprazole (PRILOSEC) 40 MG capsule Take 40 mg by mouth daily.  . ramipril (ALTACE) 10 MG capsule Take 2 capsules (20 mg total) by mouth daily.   No facility-administered encounter medications on file as of 01/02/2018.     Surgical History: Past Surgical History:  Procedure Laterality Date  . BIOPSY TONGUE    . CARDIAC CATHETERIZATION N/A 11/10/2015   Procedure: Left Heart Cath and Coronary Angiography;  Surgeon: Wellington Hampshire, MD;  Location: [facility];  Service: Cardiovascular;  Laterality: N/A;  . COLONOSCOPY  05/2006  . COLONOSCOPY WITH PROPOFOL N/A 05/24/2017   Procedure: COLONOSCOPY WITH PROPOFOL;  Surgeon: Lollie Sails, MD;  Location: [facility] ENDOSCOPY;  Service: Endoscopy;  Laterality: N/A;  . EXCISION OF TONGUE LESION N/A 11/11/2015   Procedure: EXCISION OF TONGUE LESION/ WITH FROZEN SECTION;  Surgeon: Beverly Gust, MD;  Location: [facility] ORS;  Service: ENT;  Laterality: N/A;    Medical History: Past Medical History:  Diagnosis Date  . Arthritis   . Complication of anesthesia    SEVERE SORE THROAT AFTER BIOPSY 2010  . Coronary artery disease    Chronic total occlusion of mid LAD  . Diabetes mellitus without complication ([place])   . History of shingles   . Hyperlipidemia   . Hypertension   . Hypothyroidism   .  OSA on CPAP   . Tongue cancer ([place])    Squamous cell CA of tongue    Family History: Family History  Problem Relation Age of Onset  . Myelodysplastic syndrome Mother   . Emphysema Father     Social History: Social History   Socioeconomic History  . Marital status: Married    Spouse name: Not on file  . Number of children: Not on file  . Years of education: Not on file  . Highest education level: Not on file   Occupational History  . Not on file  Social Needs  . Financial resource strain: Not on file  . Food insecurity:    Worry: Not on file    Inability: Not on file  . Transportation needs:    Medical: Not on file    Non-medical: Not on file  Tobacco Use  . Smoking status: Never Smoker  . Smokeless tobacco: Never Used  Substance and Sexual Activity  . Alcohol use: Yes    Alcohol/week: 0.0 standard drinks    Comment: occassionally   . Drug use: No  . Sexual activity: Not on file  Lifestyle  . Physical activity:    Days per week: Not on file    Minutes per session: Not on file  . Stress: Not on file  Relationships  . Social connections:    Talks on phone: Not on file    Gets together: Not on file    Attends religious service: Not on file    Active member of club or organization: Not on file    Attends meetings of clubs or organizations: Not on file    Relationship status: Not on file  . Intimate partner violence:    Fear of current or ex partner: Not on file    Emotionally abused: Not on file    Physically abused: Not on file    Forced sexual activity: Not on file  Other Topics Concern  . Not on file  Social History Narrative  . Not on file    Vital Signs: Blood pressure 138/80, pulse 62, resp. rate 16, height 5\' 10"  (1.778 m), weight 259 lb (117.5 kg), SpO2 95 %.  Examination: General Appearance: The patient is well-developed, well-nourished, and in no distress. Skin: Gross inspection of skin unremarkable. Head: normocephalic, no gross deformities. Eyes: no gross deformities noted. ENT: ears appear grossly normal no exudates. Neck: Supple. No thyromegaly. No LAD. Respiratory: Clear to ausculation bilateraly. Cardiovascular: Normal S1 and S2 without murmur or rub. Extremities: No cyanosis. pulses are equal. Neurologic: Alert and oriented. No involuntary movements.  LABS: Recent Results (from the past 2160 hour(s))  Basic metabolic panel     Status: Abnormal    Collection Time: 12/21/17 11:44 AM  Result Value Ref Range   Glucose 97 65 - 99 mg/dL   BUN 17 8 - 27 mg/dL   Creatinine, Ser 1.24 0.76 - 1.27 mg/dL   GFR calc non Af Amer 58 (L) >59 mL/min/1.73   GFR calc Af Amer 67 >59 mL/min/1.73   BUN/Creatinine Ratio 14 10 - 24   Sodium 142 134 - 144 mmol/L   Potassium 5.0 3.5 - 5.2 mmol/L   Chloride 101 96 - 106 mmol/L   CO2 25 20 - 29 mmol/L   Calcium 9.3 8.6 - 10.2 mg/dL    Radiology: No results found.  No results found.  No results found.    Assessment and Plan: Patient Active Problem List   Diagnosis Date Noted  .  Coronary artery disease of native artery of native heart with stable angina pectoris ([place]) 12/22/2017  . Essential hypertension 12/22/2017  . Hyperlipidemia LDL goal <70 12/22/2017  . Cancer of ventral surface of tongue ([place]) 11/04/2015  . Malignant neoplasm of tongue, tip and lateral border ([place]) 11/04/2015  . Primary osteoarthritis of both knees 05/13/2014  . Other intervertebral disc displacement, lumbar region 01/16/2014  . Neuritis or radiculitis due to rupture of lumbar intervertebral disc 01/16/2014  . Arthritis, degenerative 03/12/2013  . Diabetes mellitus ([place]) 03/12/2013  . Benign prostatic hyperplasia with urinary obstruction 03/06/2012   1. Obstructive sleep apnea (adult) (pediatric) Continue using CPAP.  Pt has excellent compliance. He denies EDS symptoms or falling asleep during the day.  We discussed that he should not be driving or operating any machinery when he is sleepy or feeling fatigued.   2. Morbid obesity ([place]) Obesity Counseling: Risk Assessment: An assessment of behavioral risk factors was made today and includes lack of exercise sedentary lifestyle, lack of portion control and poor dietary habits.  Risk Modification Advice: She was counseled on portion control guidelines. Restricting daily caloric intake to. . The detrimental long term effects of obesity on her health and ongoing poor  compliance was also discussed with the patient.   3. Essential hypertension Controlled at this time. Continue current regimen.    General Counseling: I have discussed the findings of the evaluation and examination with Tony Lawrence.  I have also discussed any further diagnostic evaluation thatmay be needed or ordered today. Tony Lawrence verbalizes understanding of the findings of todays visit. We also reviewed his medications today and discussed drug interactions and side effects including but not limited excessive drowsiness and altered mental states. We also discussed that there is always a risk not just to him but also people around him. he has been encouraged to call the office with any questions or concerns that should arise related to todays visit.    Time spent:  25 This patient was seen by Orson Gear AGNP-C in Collaboration with Dr. Devona Konig as a part of collaborative care agreement.   I have personally obtained a history, examined the patient, evaluated laboratory and imaging results, formulated the assessment and plan and placed orders.    Allyne Gee, MD [facility] Pulmonary and Critical Care Sleep medicine

## 2018-01-05 ENCOUNTER — Other Ambulatory Visit
Admission: RE | Admit: 2018-01-05 | Discharge: 2018-01-05 | Disposition: A | Discharge status: Home / Self Care | Payer: Medicare Other | Source: Ambulatory Visit | Attending: Internal Medicine | Admitting: Internal Medicine

## 2018-01-05 DIAGNOSIS — Z79899 Other long term (current) drug therapy: Secondary | ICD-10-CM | POA: Insufficient documentation

## 2018-01-05 DIAGNOSIS — I10 Essential (primary) hypertension: Secondary | ICD-10-CM | POA: Diagnosis not present

## 2018-01-05 DIAGNOSIS — E785 Hyperlipidemia, unspecified: Secondary | ICD-10-CM | POA: Diagnosis not present

## 2018-01-05 LAB — LIPID PANEL
CHOL/HDL RATIO: 3.7 ratio
Cholesterol: 81 mg/dL (ref 0–200)
HDL: 22 mg/dL — AB (ref >40)
LDL CALC: 29 mg/dL (ref 0–99)
Triglycerides: 150 mg/dL — ABNORMAL HIGH (ref <150)
VLDL: 30 mg/dL (ref 0–40)

## 2018-01-05 LAB — BASIC METABOLIC PANEL
Anion gap: 8 (ref 5–15)
BUN: 15 mg/dL (ref 8–23)
CO2: 28 mmol/L (ref 22–32)
Calcium: 9 mg/dL (ref 8.9–10.3)
Chloride: 104 mmol/L (ref 98–111)
Creatinine, Ser: 1.15 mg/dL (ref 0.61–1.24)
GFR calc Af Amer: >60 mL/min (ref >60)
GLUCOSE: 111 mg/dL — AB (ref 70–99)
Potassium: 4.8 mmol/L (ref 3.5–5.1)
SODIUM: 140 mmol/L (ref 135–145)

## 2018-01-05 LAB — ALT: ALT: 39 U/L (ref 0–44)

## 2018-01-11 ENCOUNTER — Ambulatory Visit (INDEPENDENT_AMBULATORY_CARE_PROVIDER_SITE_OTHER): Payer: Medicare Other | Admitting: Adult Health

## 2018-01-11 ENCOUNTER — Encounter: Payer: Self-pay | Admitting: Adult Health

## 2018-01-11 VITALS — BP 136/63 | HR 67 | Resp 16 | Ht 70.0 in | Wt 258.6 lb

## 2018-01-11 DIAGNOSIS — I10 Essential (primary) hypertension: Secondary | ICD-10-CM

## 2018-01-11 DIAGNOSIS — Z0001 Encounter for general adult medical examination with abnormal findings: Secondary | ICD-10-CM

## 2018-01-11 DIAGNOSIS — M158 Other polyosteoarthritis: Secondary | ICD-10-CM

## 2018-01-11 DIAGNOSIS — K219 Gastro-esophageal reflux disease without esophagitis: Secondary | ICD-10-CM | POA: Diagnosis not present

## 2018-01-11 DIAGNOSIS — Z23 Encounter for immunization: Secondary | ICD-10-CM | POA: Diagnosis not present

## 2018-01-11 DIAGNOSIS — G4733 Obstructive sleep apnea (adult) (pediatric): Secondary | ICD-10-CM | POA: Diagnosis not present

## 2018-01-11 DIAGNOSIS — E782 Mixed hyperlipidemia: Secondary | ICD-10-CM

## 2018-01-11 DIAGNOSIS — E039 Hypothyroidism, unspecified: Secondary | ICD-10-CM | POA: Diagnosis not present

## 2018-01-11 DIAGNOSIS — E1165 Type 2 diabetes mellitus with hyperglycemia: Secondary | ICD-10-CM | POA: Diagnosis not present

## 2018-01-11 LAB — POCT GLYCOSYLATED HEMOGLOBIN (HGB A1C): Hemoglobin A1C: 6 % — AB (ref 4.0–5.6)

## 2018-01-11 MED ORDER — OMEPRAZOLE 40 MG PO CPDR: 40.0000 mg | DELAYED_RELEASE_CAPSULE | Freq: Every day | ORAL | 3 refills | Status: DC

## 2018-01-11 NOTE — Progress Notes (Signed)
[facility] [place], [place] [address]  Internal MEDICINE  Office Visit Note  Patient Name: Tony Lawrence  [id number]  [id number]  Date of Service: 01/17/2018  Chief Complaint  Patient presents with  . Hyperlipidemia  . Hypertension  . Diabetes  . Annual Exam   HPI Pt is here for routine health maintenance examination.  He has a history of HLD, HTN, and DM.  He reports his is taking all his medications.  His A1C today is 6.0. He reports his diet is going well, he reports approximately 40 pound weight loss over last two years. He denies tobacco, alcohol or illicit drug use. He generally feeling good.  He battles with his arthritis, but is reluctant to take more medication, as he feels like he takes enough pills.    Current Medication: Outpatient Encounter Medications as of 01/11/2018  Medication Sig  . aspirin 81 MG tablet Take 81 mg by mouth daily.  Marland Kitchen atorvastatin (LIPITOR) 20 MG tablet TAKE ONE TABLET BY MOUTH DAILY  . bisoprolol-hydrochlorothiazide (ZIAC) 2.5-6.25 MG tablet TAKE ONE TABLET BY MOUTH DAILY  . clopidogrel (PLAVIX) 75 MG tablet TAKE ONE TABLET BY MOUTH DAILY  . glimepiride (AMARYL) 2 MG tablet TAKE ONE-HALF TABLET BY MOUTH EVERY MORNING AND TAKE ONE-HALF TABLET BY MOUTH WITH SUPPER  . isosorbide mononitrate (IMDUR) 30 MG 24 hr tablet Take 1 tablet (30 mg total) by mouth 2 (two) times daily.  Marland Kitchen JARDIANCE 25 MG TABS tablet TAKE ONE TABLET BY MOUTH DAILY  . levothyroxine (SYNTHROID) 200 MCG tablet Take 200 mcg by mouth daily.  Marland Kitchen liothyronine (CYTOMEL) 5 MCG tablet TAKE ONE TAB IN AM ALONG WITH LEVOTHYROXINE  . metFORMIN (GLUCOPHAGE) 1000 MG tablet Take 1,000 mg by mouth 2 (two) times daily.  . Multiple Vitamins-Minerals (CENTRUM SILVER 50+MEN PO) Take 1 tablet by mouth every morning.   Marland Kitchen omeprazole (PRILOSEC) 40 MG capsule Take 1 capsule (40 mg total) by mouth daily.  . pneumococcal 23 valent vaccine (PNEUMOVAX 23) 25 MCG/0.5ML injection  Inject 0.5 mLs into the muscle tomorrow at 10 am.  . ramipril (ALTACE) 10 MG capsule Take 2 capsules (20 mg total) by mouth daily.  . [DISCONTINUED] omeprazole (PRILOSEC) 40 MG capsule Take 40 mg by mouth daily.   No facility-administered encounter medications on file as of 01/11/2018.     Surgical History: Past Surgical History:  Procedure Laterality Date  . BIOPSY TONGUE    . CARDIAC CATHETERIZATION N/A 11/10/2015   Procedure: Left Heart Cath and Coronary Angiography;  Surgeon: Wellington Hampshire, MD;  Location: [facility];  Service: Cardiovascular;  Laterality: N/A;  . COLONOSCOPY  05/2006  . COLONOSCOPY WITH PROPOFOL N/A 05/24/2017   Procedure: COLONOSCOPY WITH PROPOFOL;  Surgeon: Lollie Sails, MD;  Location: [facility] ENDOSCOPY;  Service: Endoscopy;  Laterality: N/A;  . EXCISION OF TONGUE LESION N/A 11/11/2015   Procedure: EXCISION OF TONGUE LESION/ WITH FROZEN SECTION;  Surgeon: Beverly Gust, MD;  Location: [facility] ORS;  Service: ENT;  Laterality: N/A;    Medical History: Past Medical History:  Diagnosis Date  . Arthritis   . Complication of anesthesia    SEVERE SORE THROAT AFTER BIOPSY 2010  . Coronary artery disease    Chronic total occlusion of mid LAD  . Diabetes mellitus without complication ([place])   . History of shingles   . Hyperlipidemia   . Hypertension   . Hypothyroidism   . OSA on CPAP   . Tongue cancer ([place])  Squamous cell CA of tongue    Family History: Family History  Problem Relation Age of Onset  . Myelodysplastic syndrome Mother   . Emphysema Father       Review of Systems  Constitutional: Negative.  Negative for chills, fatigue and unexpected weight change.  HENT: Negative.  Negative for congestion, rhinorrhea, sneezing and sore throat.   Eyes: Negative for redness.  Respiratory: Negative.  Negative for cough, chest tightness and shortness of breath.   Cardiovascular: Negative.  Negative for chest pain and palpitations.   Gastrointestinal: Negative.  Negative for abdominal pain, constipation, diarrhea, nausea and vomiting.  Endocrine: Negative.   Genitourinary: Negative.  Negative for dysuria and frequency.  Musculoskeletal: Negative.  Negative for arthralgias, back pain, joint swelling and neck pain.  Skin: Negative.  Negative for rash.  Allergic/Immunologic: Negative.   Neurological: Negative.  Negative for tremors and numbness.  Hematological: Negative for adenopathy. Does not bruise/bleed easily.  Psychiatric/Behavioral: Negative.  Negative for behavioral problems, sleep disturbance and suicidal ideas. The patient is not nervous/anxious.      Vital Signs: BP 136/63   Pulse 67   Resp 16   Ht _0  (1.778 m)   Wt 258 lb 9.6 oz (117.3 kg)   SpO2 95%   BMI 37.11 kg/m    Physical Exam  Constitutional: He is oriented to person, place, and time. He appears well-developed and well-nourished. No distress.  HENT:  Head: Normocephalic and atraumatic.  Mouth/Throat: Oropharynx is clear and moist. No oropharyngeal exudate.  Eyes: Pupils are equal, round, and reactive to light. EOM are normal.  Neck: Normal range of motion. Neck supple. No JVD present. No tracheal deviation present. No thyromegaly present.  Cardiovascular: Normal rate, regular rhythm and normal heart sounds. Exam reveals no gallop and no friction rub.  No murmur heard. Pulmonary/Chest: Effort normal and breath sounds normal. No respiratory distress. He has no wheezes. He has no rales. He exhibits no tenderness.  Abdominal: Soft. There is no tenderness. There is no guarding.  Musculoskeletal: Normal range of motion.  Lymphadenopathy:    He has no cervical adenopathy.  Neurological: He is alert and oriented to person, place, and time. No cranial nerve deficit.  Skin: Skin is warm and dry. He is not diaphoretic.  Psychiatric: He has a normal mood and affect. His behavior is normal. Judgment and thought content normal.  Nursing note and  vitals reviewed.  Depression screen [facility] 2/9 01/11/2018 01/02/2018 07/05/2017  Decreased Interest 0 0 0  Down, Depressed, Hopeless 0 0 0  PHQ - 2 Score 0 0 0    Functional Status Survey: Is the patient deaf or have difficulty hearing?: No Does the patient have difficulty seeing, even when wearing glasses/contacts?: No Does the patient have difficulty concentrating, remembering, or making decisions?: No Does the patient have difficulty walking or climbing stairs?: No Does the patient have difficulty dressing or bathing?: No Does the patient have difficulty doing errands alone such as visiting a doctor's office or shopping?: No  MMSE - [place] Exam 01/11/2018  Orientation to time 5  Orientation to Place 5  Registration 3  Attention/ Calculation 5  Recall 3  Language- name 2 objects 2  Language- repeat 1  Language- follow 3 step command 3  Language- read & follow direction 1  Write a sentence 1  Copy design 1  Total score 30    Fall Risk  01/11/2018 01/02/2018 07/05/2017 12/30/2015  Falls in the past year? No No No  No  Comment - - - Emmi Telephone Survey: data to providers prior to load     LABS: Recent Results (from the past 2160 hour(s))  Basic metabolic panel     Status: Abnormal   Collection Time: 12/21/17 11:44 AM  Result Value Ref Range   Glucose 97 65 - 99 mg/dL   BUN 17 8 - 27 mg/dL   Creatinine, Ser 1.24 0.76 - 1.27 mg/dL   GFR calc non Af Amer 58 (L) >59 mL/min/1.73   GFR calc Af Amer 67 >59 mL/min/1.73   BUN/Creatinine Ratio 14 10 - 24   Sodium 142 134 - 144 mmol/L   Potassium 5.0 3.5 - 5.2 mmol/L   Chloride 101 96 - 106 mmol/L   CO2 25 20 - 29 mmol/L   Calcium 9.3 8.6 - 10.2 mg/dL  ALT     Status: None   Collection Time: 01/05/18  8:10 AM  Result Value Ref Range   ALT 39 0 - 44 U/L    Comment: Performed at [facility], [address] [place], [place] [address]  Lipid panel     Status: Abnormal   Collection Time: 01/05/18  8:10 AM  Result  Value Ref Range   Cholesterol 81 0 - 200 mg/dL   Triglycerides 150 (H) <150 mg/dL   HDL 22 (L) >40 mg/dL   Total CHOL/HDL Ratio 3.7 RATIO   VLDL 30 0 - 40 mg/dL   LDL Cholesterol 29 0 - 99 mg/dL    Comment:        Total Cholesterol/HDL:CHD Risk Coronary Heart Disease Risk Table                     Men   Women  1/2 Average Risk   3.4   3.3  Average Risk       5.0   4.4  2 X Average Risk   9.6   7.1  3 X Average Risk  23.4   11.0        Use the calculated Patient Ratio above and the CHD Risk Table to determine the patient's CHD Risk.        ATP III CLASSIFICATION (LDL):  <100     mg/dL   Optimal  100-129  mg/dL   Near or Above                    Optimal  130-159  mg/dL   Borderline  160-189  mg/dL   High  >190     mg/dL   Very High Performed at [facility], [address] [place], [place] [address]   Basic metabolic panel     Status: Abnormal   Collection Time: 01/05/18  8:10 AM  Result Value Ref Range   Sodium 140 135 - 145 mmol/L   Potassium 4.8 3.5 - 5.1 mmol/L   Chloride 104 98 - 111 mmol/L   CO2 28 22 - 32 mmol/L   Glucose, Bld 111 (H) 70 - 99 mg/dL   BUN 15 8 - 23 mg/dL   Creatinine, Ser 1.15 0.61 - 1.24 mg/dL   Calcium 9.0 8.9 - 10.3 mg/dL   GFR calc non Af Amer >60 >60 mL/min   GFR calc Af Amer >60 >60 mL/min    Comment: (NOTE) The eGFR has been calculated using the CKD EPI equation. This calculation has not been validated in all clinical situations. eGFR's persistently <60 mL/min signify possible Chronic Kidney Disease.  Anion gap 8 5 - 15    Comment: Performed at [facility], [address] [place],  [address]  UA/M w/rflx Culture, Routine     Status: Abnormal   Collection Time: 01/11/18 10:16 AM  Result Value Ref Range   Specific Gravity, UA 1.021 1.005 - 1.030   pH, UA 5.0 5.0 - 7.5   Color, UA Yellow Yellow   Appearance Ur Clear Clear   Leukocytes, UA Negative Negative   Protein, UA Negative Negative/Trace    Glucose, UA 3+ (A) Negative   Ketones, UA Negative Negative   RBC, UA Negative Negative   Bilirubin, UA Negative Negative   Urobilinogen, Ur 0.2 0.2 - 1.0 mg/dL   Nitrite, UA Negative Negative   Microscopic Examination Comment     Comment: Microscopic follows if indicated.   Microscopic Examination See below:     Comment: Microscopic was indicated and was performed.   Urinalysis Reflex Comment     Comment: This specimen will not reflex to a Urine Culture.  Microscopic Examination     Status: None   Collection Time: 01/11/18 10:16 AM  Result Value Ref Range   WBC, UA 0-5 0 - 5 /hpf   RBC, UA None seen 0 - 2 /hpf   Epithelial Cells (non renal) 0-10 0 - 10 /hpf   Casts None seen None seen /lpf   Bacteria, UA None seen None seen/Few  POCT HgB A1C     Status: Abnormal   Collection Time: 01/11/18 10:36 AM  Result Value Ref Range   Hemoglobin A1C 6.0 (A) 4.0 - 5.6 %   HbA1c POC (<> result, manual entry)     HbA1c, POC (prediabetic range)     HbA1c, POC (controlled diabetic range)    CBC with Differential/Platelet     Status: None   Collection Time: 01/11/18 11:16 AM  Result Value Ref Range   WBC 7.9 3.4 - 10.8 x10E3/uL   RBC 5.49 4.14 - 5.80 x10E6/uL   Hemoglobin 16.2 13.0 - 17.7 g/dL   Hematocrit 49.0 37.5 - 51.0 %   MCV 89 79 - 97 fL   MCH 29.5 26.6 - 33.0 pg   MCHC 33.1 31.5 - 35.7 g/dL   RDW 13.0 12.3 - 15.4 %   Platelets 199 150 - 450 x10E3/uL   Neutrophils 62 Not Estab. %   Lymphs 29 Not Estab. %   Monocytes 8 Not Estab. %   Eos 1 Not Estab. %   Basos 0 Not Estab. %   Neutrophils Absolute 4.9 1.4 - 7.0 x10E3/uL   Lymphocytes Absolute 2.3 0.7 - 3.1 x10E3/uL   Monocytes Absolute 0.7 0.1 - 0.9 x10E3/uL   EOS (ABSOLUTE) 0.1 0.0 - 0.4 x10E3/uL   Basophils Absolute 0.0 0.0 - 0.2 x10E3/uL   Immature Granulocytes 0 Not Estab. %   Immature Grans (Abs) 0.0 0.0 - 0.1 x10E3/uL  TSH     Status: None   Collection Time: 01/11/18 11:16 AM  Result Value Ref Range   TSH 0.801 0.450  - 4.500 uIU/mL  T4, free     Status: None   Collection Time: 01/11/18 11:16 AM  Result Value Ref Range   Free T4 1.26 0.82 - 1.77 ng/dL  PSA     Status: None   Collection Time: 01/11/18 11:16 AM  Result Value Ref Range   Prostate Specific Ag, Serum 1.2 0.0 - 4.0 ng/mL    Comment: Roche ECLIA methodology. According to the American Urological Association, Serum PSA  should decrease and remain at undetectable levels after radical prostatectomy. The AUA defines biochemical recurrence as an initial PSA value 0.2 ng/mL or greater followed by a subsequent confirmatory PSA value 0.2 ng/mL or greater. Values obtained with different assay methods or kits cannot be used interchangeably. Results cannot be interpreted as absolute evidence of the presence or absence of malignant disease.     Assessment/Plan: 1. Encounter for general adult medical examination with abnormal findings Pt is up to date with PMH.  Will check labs, follow up as needed.  CBC TSH Free T4 PSA - UA/M w/rflx Culture, Routine  2. Uncontrolled type 2 diabetes mellitus with hyperglycemia (HCC) Stable, continue as prescribed.  - POCT HgB A1C  3. Essential hypertension Stable, continue current medications.   4. Mixed hyperlipidemia Stable, continue Lipitor.   5. Obstructive sleep apnea (adult) (pediatric) Sees Pulmonology, continue using CPAP.  6. Other osteoarthritis involving multiple joints Sees ortho as needed  7. Hypothyroidism, unspecified type Will follow up with labs.  8. Gastroesophageal reflux disease without esophagitis Continue Prilosec as prescribed. - omeprazole (PRILOSEC) 40 MG capsule; Take 1 capsule (40 mg total) by mouth daily.  Dispense: 90 capsule; Refill: 3  9. Morbid obesity (HCC) Obesity Counseling: Risk Assessment: An assessment of behavioral risk factors was made today and includes lack of exercise sedentary lifestyle, lack of portion control and poor dietary habits.  Risk  Modification Advice: She was counseled on portion control guidelines. Restricting daily caloric intake to. . The detrimental long term effects of obesity on her health and ongoing poor compliance was also discussed with the patient.  10. Flu vaccine need - Flu Vaccine MDCK QUAD PF  General Counseling: Zvi verbalizes understanding of the findings of todays visit and agrees with plan of treatment. I have discussed any further diagnostic evaluation that may be needed or ordered today. We also reviewed his medications today. he has been encouraged to call the office with any questions or concerns that should arise related to todays visit.   Orders Placed This Encounter  Procedures  . Microscopic Examination  . Flu Vaccine MDCK QUAD PF  . UA/M w/rflx Culture, Routine  . CBC with Differential/Platelet  . TSH  . T4, free  . PSA  . POCT HgB A1C    Meds ordered this encounter  Medications  . omeprazole (PRILOSEC) 40 MG capsule    Sig: Take 1 capsule (40 mg total) by mouth daily.    Dispense:  90 capsule    Refill:  3    Time spent: 25 Minutes   This patient was seen by Orson Gear AGNP-C in Collaboration with Dr Lavera Guise as a part of collaborative care agreement   Lavera Guise, MD  Internal Medicine

## 2018-01-11 NOTE — Patient Instructions (Signed)

## 2018-01-12 LAB — CBC WITH DIFFERENTIAL/PLATELET
BASOS ABS: 0 10*3/uL (ref 0.0–0.2)
Basos: 0 %
EOS (ABSOLUTE): 0.1 10*3/uL (ref 0.0–0.4)
Eos: 1 %
Hematocrit: 49 % (ref 37.5–51.0)
Hemoglobin: 16.2 g/dL (ref 13.0–17.7)
Immature Grans (Abs): 0 10*3/uL (ref 0.0–0.1)
Immature Granulocytes: 0 %
LYMPHS ABS: 2.3 10*3/uL (ref 0.7–3.1)
Lymphs: 29 %
MCH: 29.5 pg (ref 26.6–33.0)
MCHC: 33.1 g/dL (ref 31.5–35.7)
MCV: 89 fL (ref 79–97)
MONOS ABS: 0.7 10*3/uL (ref 0.1–0.9)
Monocytes: 8 %
NEUTROS ABS: 4.9 10*3/uL (ref 1.4–7.0)
Neutrophils: 62 %
PLATELETS: 199 10*3/uL (ref 150–450)
RBC: 5.49 x10E6/uL (ref 4.14–5.80)
RDW: 13 % (ref 12.3–15.4)
WBC: 7.9 10*3/uL (ref 3.4–10.8)

## 2018-01-12 LAB — UA/M W/RFLX CULTURE, ROUTINE
BILIRUBIN UA: NEGATIVE
Ketones, UA: NEGATIVE
LEUKOCYTES UA: NEGATIVE
NITRITE UA: NEGATIVE
PROTEIN UA: NEGATIVE
RBC UA: NEGATIVE
SPEC GRAV UA: 1.021 (ref 1.005–1.030)
Urobilinogen, Ur: 0.2 mg/dL (ref 0.2–1.0)
pH, UA: 5 (ref 5.0–7.5)

## 2018-01-12 LAB — MICROSCOPIC EXAMINATION
Bacteria, UA: NONE SEEN
Casts: NONE SEEN /lpf
RBC, UA: NONE SEEN /hpf (ref 0–2)

## 2018-01-12 LAB — T4, FREE: FREE T4: 1.26 ng/dL (ref 0.82–1.77)

## 2018-01-12 LAB — PSA: Prostate Specific Ag, Serum: 1.2 ng/mL (ref 0.0–4.0)

## 2018-01-12 LAB — TSH: TSH: 0.801 u[IU]/mL (ref 0.450–4.500)

## 2018-01-25 DIAGNOSIS — D3702 Neoplasm of uncertain behavior of tongue: Secondary | ICD-10-CM | POA: Diagnosis not present

## 2018-01-26 ENCOUNTER — Other Ambulatory Visit: Payer: Self-pay | Admitting: Internal Medicine

## 2018-01-26 MED ORDER — BISOPROLOL-HYDROCHLOROTHIAZIDE 2.5-6.25 MG PO TABS: 1.0000 | ORAL_TABLET | Freq: Every day | ORAL | 1 refills | Status: DC

## 2018-01-27 DIAGNOSIS — Z6838 Body mass index (BMI) 38.0-38.9, adult: Secondary | ICD-10-CM | POA: Diagnosis not present

## 2018-01-27 DIAGNOSIS — C029 Malignant neoplasm of tongue, unspecified: Secondary | ICD-10-CM | POA: Diagnosis not present

## 2018-02-03 ENCOUNTER — Encounter: Payer: Self-pay | Admitting: Adult Health

## 2018-02-03 ENCOUNTER — Ambulatory Visit (INDEPENDENT_AMBULATORY_CARE_PROVIDER_SITE_OTHER): Payer: Medicare Other | Admitting: Adult Health

## 2018-02-03 ENCOUNTER — Other Ambulatory Visit: Payer: Self-pay | Admitting: Adult Health

## 2018-02-03 VITALS — BP 130/80 | HR 60 | Resp 16 | Ht 70.0 in | Wt 258.0 lb

## 2018-02-03 DIAGNOSIS — K219 Gastro-esophageal reflux disease without esophagitis: Secondary | ICD-10-CM

## 2018-02-03 DIAGNOSIS — Z23 Encounter for immunization: Secondary | ICD-10-CM | POA: Diagnosis not present

## 2018-02-03 DIAGNOSIS — E109 Type 1 diabetes mellitus without complications: Secondary | ICD-10-CM

## 2018-02-03 DIAGNOSIS — E039 Hypothyroidism, unspecified: Secondary | ICD-10-CM | POA: Diagnosis not present

## 2018-02-03 DIAGNOSIS — G4733 Obstructive sleep apnea (adult) (pediatric): Secondary | ICD-10-CM

## 2018-02-03 DIAGNOSIS — I10 Essential (primary) hypertension: Secondary | ICD-10-CM | POA: Diagnosis not present

## 2018-02-03 DIAGNOSIS — E782 Mixed hyperlipidemia: Secondary | ICD-10-CM

## 2018-02-03 MED ORDER — PNEUMOCOCCAL 13-VAL CONJ VACC IM SUSP: 0.5000 mL | INTRAMUSCULAR | 0 refills | Status: AC

## 2018-02-03 NOTE — Patient Instructions (Signed)
Diabetes Mellitus and Nutrition When you have diabetes (diabetes mellitus), it is very important to have healthy eating habits because your blood sugar (glucose) levels are greatly affected by what you eat and drink. Eating healthy foods in the appropriate amounts, at about the same times every day, can help you:  Control your blood glucose.  Lower your risk of heart disease.  Improve your blood pressure.  Reach or maintain a healthy weight.  Every person with diabetes is different, and each person has different needs for a meal plan. Your health care provider may recommend that you work with a diet and nutrition specialist (dietitian) to make a meal plan that is best for you. Your meal plan may vary depending on factors such as:  The calories you need.  The medicines you take.  Your weight.  Your blood glucose, blood pressure, and cholesterol levels.  Your activity level.  Other health conditions you have, such as heart or kidney disease.  How do carbohydrates affect me? Carbohydrates affect your blood glucose level more than any other type of food. Eating carbohydrates naturally increases the amount of glucose in your blood. Carbohydrate counting is a method for keeping track of how many carbohydrates you eat. Counting carbohydrates is important to keep your blood glucose at a healthy level, especially if you use insulin or take certain oral diabetes medicines. It is important to know how many carbohydrates you can safely have in each meal. This is different for every person. Your dietitian can help you calculate how many carbohydrates you should have at each meal and for snack. Foods that contain carbohydrates include:  Bread, cereal, rice, pasta, and crackers.  Potatoes and corn.  Peas, beans, and lentils.  Milk and yogurt.  Fruit and juice.  Desserts, such as cakes, cookies, ice cream, and candy.  How does alcohol affect me? Alcohol can cause a sudden decrease in blood  glucose (hypoglycemia), especially if you use insulin or take certain oral diabetes medicines. Hypoglycemia can be a life-threatening condition. Symptoms of hypoglycemia (sleepiness, dizziness, and confusion) are similar to symptoms of having too much alcohol. If your health care provider says that alcohol is safe for you, follow these guidelines:  Limit alcohol intake to no more than 1 drink per day for nonpregnant women and 2 drinks per day for men. One drink equals 12 oz of beer, 5 oz of wine, or 1 oz of hard liquor.  Do not drink on an empty stomach.  Keep yourself hydrated with water, diet soda, or unsweetened iced tea.  Keep in mind that regular soda, juice, and other mixers may contain a lot of sugar and must be counted as carbohydrates.  What are tips for following this plan? Reading food labels  Start by checking the serving size on the label. The amount of calories, carbohydrates, fats, and other nutrients listed on the label are based on one serving of the food. Many foods contain more than one serving per package.  Check the total grams (g) of carbohydrates in one serving. You can calculate the number of servings of carbohydrates in one serving by dividing the total carbohydrates by 15. For example, if a food has 30 g of total carbohydrates, it would be equal to 2 servings of carbohydrates.  Check the number of grams (g) of saturated and trans fats in one serving. Choose foods that have low or no amount of these fats.  Check the number of milligrams (mg) of sodium in one serving. Most people   should limit total sodium intake to less than 2,300 mg per day.  Always check the nutrition information of foods labeled as "low-fat" or "nonfat". These foods may be higher in added sugar or refined carbohydrates and should be avoided.  Talk to your dietitian to identify your daily goals for nutrients listed on the label. Shopping  Avoid buying canned, premade, or processed foods. These  foods tend to be high in fat, sodium, and added sugar.  Shop around the outside edge of the grocery store. This includes fresh fruits and vegetables, bulk grains, fresh meats, and fresh dairy. Cooking  Use low-heat cooking methods, such as baking, instead of high-heat cooking methods like deep frying.  Cook using healthy oils, such as olive, canola, or sunflower oil.  Avoid cooking with butter, cream, or high-fat meats. Meal planning  Eat meals and snacks regularly, preferably at the same times every day. Avoid going long periods of time without eating.  Eat foods high in fiber, such as fresh fruits, vegetables, beans, and whole grains. Talk to your dietitian about how many servings of carbohydrates you can eat at each meal.  Eat 4-6 ounces of lean protein each day, such as lean meat, chicken, fish, eggs, or tofu. 1 ounce is equal to 1 ounce of meat, chicken, or fish, 1 egg, or 1/4 cup of tofu.  Eat some foods each day that contain healthy fats, such as avocado, nuts, seeds, and fish. Lifestyle   Check your blood glucose regularly.  Exercise at least 30 minutes 5 or more days each week, or as told by your health care provider.  Take medicines as told by your health care provider.  Do not use any products that contain nicotine or tobacco, such as cigarettes and e-cigarettes. If you need help quitting, ask your health care provider.  Work with a counselor or diabetes educator to identify strategies to manage stress and any emotional and social challenges. What are some questions to ask my health care provider?  Do I need to meet with a diabetes educator?  Do I need to meet with a dietitian?  What number can I call if I have questions?  When are the best times to check my blood glucose? Where to find more information:  American Diabetes Association: diabetes.org/food-and-fitness/food  Academy of Nutrition and Dietetics:  www.eatright.org/resources/health/diseases-and-conditions/diabetes  National Institute of Diabetes and Digestive and Kidney Diseases (NIH): www.niddk.nih.gov/health-information/diabetes/overview/diet-eating-physical-activity Summary  A healthy meal plan will help you control your blood glucose and maintain a healthy lifestyle.  Working with a diet and nutrition specialist (dietitian) can help you make a meal plan that is best for you.  Keep in mind that carbohydrates and alcohol have immediate effects on your blood glucose levels. It is important to count carbohydrates and to use alcohol carefully. This information is not intended to replace advice given to you by your health care provider. Make sure you discuss any questions you have with your health care provider. Document Released: 01/07/2005 Document Revised: 05/17/2016 Document Reviewed: 05/17/2016 Elsevier Interactive Patient Education  2018 Elsevier Inc.  

## 2018-02-03 NOTE — Progress Notes (Signed)
[facility] [place], [place] [address]  Internal MEDICINE  Office Visit Note  Patient Name: Tony Lawrence  [id number]  [id number]  Date of Service: 02/03/2018  Chief Complaint  Patient presents with  . Diabetes    follow up labs   . Hyperlipidemia  . Hypertension  . Hypothyroidism    HPI  Tony Lawrence is here today for follow-up on lab work.  He is generally doing well and has no complaints at this time.  He has a history of diabetes, hyperlipidemia, hypertension and hypothyroidism.  He is currently taking Jardiance and metformin for his diabetes.  His most lipid panel was done in September 2019.  It shows a low HDL at 22 and high triglycerides at 150.  He is currently taking Lipitor and denies issues at this time.  He is currently managing his hypertension with Ziac.  And he is taking levothyroxine 200 mcg for his hypothyroidism.    Current Medication: Outpatient Encounter Medications as of 02/03/2018  Medication Sig  . [DISCONTINUED] pneumococcal 13-valent conjugate vaccine (PREVNAR 13) SUSP injection Inject 0.5 mLs into the muscle tomorrow at 10 am.  . aspirin 81 MG tablet Take 81 mg by mouth daily.  Marland Kitchen atorvastatin (LIPITOR) 20 MG tablet TAKE ONE TABLET BY MOUTH DAILY  . bisoprolol-hydrochlorothiazide (ZIAC) 2.5-6.25 MG tablet Take 1 tablet by mouth daily.  . clopidogrel (PLAVIX) 75 MG tablet TAKE ONE TABLET BY MOUTH DAILY  . glimepiride (AMARYL) 2 MG tablet TAKE ONE-HALF TABLET BY MOUTH EVERY MORNING AND TAKE ONE-HALF TABLET BY MOUTH WITH SUPPER  . isosorbide mononitrate (IMDUR) 30 MG 24 hr tablet Take 1 tablet (30 mg total) by mouth 2 (two) times daily.  Marland Kitchen JARDIANCE 25 MG TABS tablet TAKE ONE TABLET BY MOUTH DAILY  . levothyroxine (SYNTHROID) 200 MCG tablet Take 200 mcg by mouth daily.  Marland Kitchen liothyronine (CYTOMEL) 5 MCG tablet TAKE ONE TAB IN AM ALONG WITH LEVOTHYROXINE  . metFORMIN (GLUCOPHAGE) 1000 MG tablet Take 1,000 mg by mouth 2 (two) times daily.   . Multiple Vitamins-Minerals (CENTRUM SILVER 50+MEN PO) Take 1 tablet by mouth every morning.   Marland Kitchen omeprazole (PRILOSEC) 40 MG capsule Take 1 capsule (40 mg total) by mouth daily.  . ramipril (ALTACE) 10 MG capsule Take 2 capsules (20 mg total) by mouth daily.  . [DISCONTINUED] pneumococcal 23 valent vaccine (PNEUMOVAX 23) 25 MCG/0.5ML injection Inject 0.5 mLs into the muscle tomorrow at 10 am.    No facility-administered encounter medications on file as of 02/03/2018.     Surgical History: Past Surgical History:  Procedure Laterality Date  . BIOPSY TONGUE    . CARDIAC CATHETERIZATION N/A 11/10/2015   Procedure: Left Heart Cath and Coronary Angiography;  Surgeon: Wellington Hampshire, MD;  Location: [facility];  Service: Cardiovascular;  Laterality: N/A;  . COLONOSCOPY  05/2006  . COLONOSCOPY WITH PROPOFOL N/A 05/24/2017   Procedure: COLONOSCOPY WITH PROPOFOL;  Surgeon: Lollie Sails, MD;  Location: [facility] ENDOSCOPY;  Service: Endoscopy;  Laterality: N/A;  . EXCISION OF TONGUE LESION N/A 11/11/2015   Procedure: EXCISION OF TONGUE LESION/ WITH FROZEN SECTION;  Surgeon: Beverly Gust, MD;  Location: [facility] ORS;  Service: ENT;  Laterality: N/A;    Medical History: Past Medical History:  Diagnosis Date  . Arthritis   . Complication of anesthesia    SEVERE SORE THROAT AFTER BIOPSY 2010  . Coronary artery disease    Chronic total occlusion of mid LAD  . Diabetes mellitus without  complication ([place])   . History of shingles   . Hyperlipidemia   . Hypertension   . Hypothyroidism   . OSA on CPAP   . Tongue cancer ([place])    Squamous cell CA of tongue    Family History: Family History  Problem Relation Age of Onset  . Myelodysplastic syndrome Mother   . Emphysema Father     Social History   Socioeconomic History  . Marital status: Married    Spouse name: Not on file  . Number of children: Not on file  . Years of education: Not on file  . Highest education level: Not on  file  Occupational History  . Not on file  Social Needs  . Financial resource strain: Not on file  . Food insecurity:    Worry: Not on file    Inability: Not on file  . Transportation needs:    Medical: Not on file    Non-medical: Not on file  Tobacco Use  . Smoking status: Never Smoker  . Smokeless tobacco: Never Used  Substance and Sexual Activity  . Alcohol use: Yes    Alcohol/week: 0.0 standard drinks    Comment: occassionally   . Drug use: No  . Sexual activity: Not on file  Lifestyle  . Physical activity:    Days per week: Not on file    Minutes per session: Not on file  . Stress: Not on file  Relationships  . Social connections:    Talks on phone: Not on file    Gets together: Not on file    Attends religious service: Not on file    Active member of club or organization: Not on file    Attends meetings of clubs or organizations: Not on file    Relationship status: Not on file  . Intimate partner violence:    Fear of current or ex partner: Not on file    Emotionally abused: Not on file    Physically abused: Not on file    Forced sexual activity: Not on file  Other Topics Concern  . Not on file  Social History Narrative  . Not on file      Review of Systems  Constitutional: Negative.  Negative for chills, fatigue and unexpected weight change.  HENT: Negative.  Negative for congestion, rhinorrhea, sneezing and sore throat.   Eyes: Negative for redness.  Respiratory: Negative.  Negative for cough, chest tightness and shortness of breath.   Cardiovascular: Negative.  Negative for chest pain and palpitations.  Gastrointestinal: Negative.  Negative for abdominal pain, constipation, diarrhea, nausea and vomiting.  Endocrine: Negative.   Genitourinary: Negative.  Negative for dysuria and frequency.  Musculoskeletal: Negative.  Negative for arthralgias, back pain, joint swelling and neck pain.  Skin: Negative.  Negative for rash.  Allergic/Immunologic: Negative.    Neurological: Negative.  Negative for tremors and numbness.  Hematological: Negative for adenopathy. Does not bruise/bleed easily.  Psychiatric/Behavioral: Negative.  Negative for behavioral problems, sleep disturbance and suicidal ideas. The patient is not nervous/anxious.     Vital Signs: BP 130/80   Pulse 60   Resp 16   Ht 5\' 10"  (1.778 m)   Wt 258 lb (117 kg)   SpO2 95%   BMI 37.02 kg/m    Physical Exam  Constitutional: He is oriented to person, place, and time. He appears well-developed and well-nourished. No distress.  HENT:  Head: Normocephalic and atraumatic.  Mouth/Throat: Oropharynx is clear and moist. No oropharyngeal exudate.  Eyes:  Pupils are equal, round, and reactive to light. EOM are normal.  Neck: Normal range of motion. Neck supple. No JVD present. No tracheal deviation present. No thyromegaly present.  Cardiovascular: Normal rate, regular rhythm and normal heart sounds. Exam reveals no gallop and no friction rub.  No murmur heard. Pulmonary/Chest: Effort normal and breath sounds normal. No respiratory distress. He has no wheezes. He has no rales. He exhibits no tenderness.  Abdominal: Soft. There is no tenderness. There is no guarding.  Musculoskeletal: Normal range of motion.  Lymphadenopathy:    He has no cervical adenopathy.  Neurological: He is alert and oriented to person, place, and time. No cranial nerve deficit.  Skin: Skin is warm and dry. He is not diaphoretic.  Psychiatric: He has a normal mood and affect. His behavior is normal. Judgment and thought content normal.  Nursing note and vitals reviewed.  Assessment/Plan: 1. Type 1 diabetes mellitus without complications ([place]) Well-controlled at this time.  Most recent hemoglobin A1c was 6.0.  Continue current therapy.  2. Essential hypertension Well-controlled.  Continue Ziac as prescribed.  3. Mixed hyperlipidemia Patient currently on Lipitor.  Most recent lipid panel reviewed.  Continue  Lipitor as prescribed.  4. Obstructive sleep apnea (adult) (pediatric) Stable at this time.  Continue using CPAP as prescribed.  5. Hypothyroidism, unspecified type Most recent TSH is 0.801, and free T4 is 1.26.  Appears stable at this time we will continue to monitor.  6. Gastroesophageal reflux disease without esophagitis Patient denies recent GERD symptoms.  Well controlled with Prilosec.  Will continue current therapy.  7. Morbid obesity ([place]) Obesity Counseling: Risk Assessment: An assessment of behavioral risk factors was made today and includes lack of exercise sedentary lifestyle, lack of portion control and poor dietary habits.  Risk Modification Advice: She was counseled on portion control guidelines. Restricting daily caloric intake to. . The detrimental long term effects of obesity on her health and ongoing poor compliance was also discussed with the patient.   General Counseling: Tayte verbalizes understanding of the findings of todays visit and agrees with plan of treatment. I have discussed any further diagnostic evaluation that may be needed or ordered today. We also reviewed his medications today. he has been encouraged to call the office with any questions or concerns that should arise related to todays visit.      No orders of the defined types were placed in this encounter.   No orders of the defined types were placed in this encounter.   Time spent: 25 Minutes   This patient was seen by Orson Gear AGNP-C in Collaboration with Dr Lavera Guise as a part of collaborative care agreement     Kendell Bane AGNP-C Internal medicine

## 2018-02-23 DIAGNOSIS — D2271 Melanocytic nevi of right lower limb, including hip: Secondary | ICD-10-CM | POA: Diagnosis not present

## 2018-02-23 DIAGNOSIS — D2272 Melanocytic nevi of left lower limb, including hip: Secondary | ICD-10-CM | POA: Diagnosis not present

## 2018-02-23 DIAGNOSIS — X32XXXA Exposure to sunlight, initial encounter: Secondary | ICD-10-CM | POA: Diagnosis not present

## 2018-02-23 DIAGNOSIS — Z08 Encounter for follow-up examination after completed treatment for malignant neoplasm: Secondary | ICD-10-CM | POA: Diagnosis not present

## 2018-02-23 DIAGNOSIS — D2261 Melanocytic nevi of right upper limb, including shoulder: Secondary | ICD-10-CM | POA: Diagnosis not present

## 2018-02-23 DIAGNOSIS — L570 Actinic keratosis: Secondary | ICD-10-CM | POA: Diagnosis not present

## 2018-02-23 DIAGNOSIS — Z85828 Personal history of other malignant neoplasm of skin: Secondary | ICD-10-CM | POA: Diagnosis not present

## 2018-02-23 DIAGNOSIS — D2262 Melanocytic nevi of left upper limb, including shoulder: Secondary | ICD-10-CM | POA: Diagnosis not present

## 2018-02-23 DIAGNOSIS — L821 Other seborrheic keratosis: Secondary | ICD-10-CM | POA: Diagnosis not present

## 2018-03-12 ENCOUNTER — Other Ambulatory Visit: Payer: Self-pay | Admitting: Internal Medicine

## 2018-03-14 ENCOUNTER — Ambulatory Visit (INDEPENDENT_AMBULATORY_CARE_PROVIDER_SITE_OTHER): Payer: Medicare Other | Admitting: Internal Medicine

## 2018-03-14 ENCOUNTER — Telehealth: Payer: Self-pay | Admitting: Internal Medicine

## 2018-03-14 ENCOUNTER — Encounter: Payer: Self-pay | Admitting: Internal Medicine

## 2018-03-14 VITALS — BP 144/78 | HR 58 | Temp 98.2°F | Ht 70.0 in | Wt 264.6 lb

## 2018-03-14 DIAGNOSIS — G8929 Other chronic pain: Secondary | ICD-10-CM | POA: Insufficient documentation

## 2018-03-14 DIAGNOSIS — E119 Type 2 diabetes mellitus without complications: Secondary | ICD-10-CM

## 2018-03-14 DIAGNOSIS — M158 Other polyosteoarthritis: Secondary | ICD-10-CM

## 2018-03-14 DIAGNOSIS — N481 Balanitis: Secondary | ICD-10-CM

## 2018-03-14 DIAGNOSIS — Z9989 Dependence on other enabling machines and devices: Secondary | ICD-10-CM

## 2018-03-14 DIAGNOSIS — E039 Hypothyroidism, unspecified: Secondary | ICD-10-CM

## 2018-03-14 DIAGNOSIS — M545 Low back pain, unspecified: Secondary | ICD-10-CM

## 2018-03-14 DIAGNOSIS — R197 Diarrhea, unspecified: Secondary | ICD-10-CM

## 2018-03-14 DIAGNOSIS — I10 Essential (primary) hypertension: Secondary | ICD-10-CM

## 2018-03-14 DIAGNOSIS — E1165 Type 2 diabetes mellitus with hyperglycemia: Secondary | ICD-10-CM

## 2018-03-14 DIAGNOSIS — B379 Candidiasis, unspecified: Secondary | ICD-10-CM | POA: Diagnosis not present

## 2018-03-14 DIAGNOSIS — K219 Gastro-esophageal reflux disease without esophagitis: Secondary | ICD-10-CM | POA: Diagnosis not present

## 2018-03-14 DIAGNOSIS — Z85828 Personal history of other malignant neoplasm of skin: Secondary | ICD-10-CM

## 2018-03-14 DIAGNOSIS — I2510 Atherosclerotic heart disease of native coronary artery without angina pectoris: Secondary | ICD-10-CM

## 2018-03-14 DIAGNOSIS — Z0001 Encounter for general adult medical examination with abnormal findings: Secondary | ICD-10-CM

## 2018-03-14 DIAGNOSIS — E782 Mixed hyperlipidemia: Secondary | ICD-10-CM

## 2018-03-14 DIAGNOSIS — G4733 Obstructive sleep apnea (adult) (pediatric): Secondary | ICD-10-CM

## 2018-03-14 DIAGNOSIS — Z85810 Personal history of malignant neoplasm of tongue: Secondary | ICD-10-CM

## 2018-03-14 DIAGNOSIS — E669 Obesity, unspecified: Secondary | ICD-10-CM

## 2018-03-14 MED ORDER — BISOPROLOL-HYDROCHLOROTHIAZIDE 2.5-6.25 MG PO TABS: 1.0000 | ORAL_TABLET | Freq: Every day | ORAL | 3 refills | Status: DC

## 2018-03-14 MED ORDER — RAMIPRIL 10 MG PO CAPS: 20.0000 mg | ORAL_CAPSULE | Freq: Every day | ORAL | 3 refills | Status: DC

## 2018-03-14 MED ORDER — ATORVASTATIN CALCIUM 20 MG PO TABS: 20.0000 mg | ORAL_TABLET | Freq: Every day | ORAL | 5 refills | Status: DC

## 2018-03-14 MED ORDER — LIOTHYRONINE SODIUM 5 MCG PO TABS: 5.0000 ug | ORAL_TABLET | ORAL | 1 refills | Status: DC

## 2018-03-14 MED ORDER — FLUCONAZOLE 150 MG PO TABS: 150.0000 mg | ORAL_TABLET | Freq: Once | ORAL | 0 refills | Status: AC

## 2018-03-14 MED ORDER — MICONAZOLE NITRATE 2 % EX CREA: 1.0000 "application " | TOPICAL_CREAM | Freq: Two times a day (BID) | CUTANEOUS | 11 refills | Status: DC

## 2018-03-14 MED ORDER — METFORMIN HCL 1000 MG PO TABS: 1000.0000 mg | ORAL_TABLET | Freq: Every day | ORAL | 3 refills | Status: DC

## 2018-03-14 MED ORDER — GLIMEPIRIDE 2 MG PO TABS: ORAL_TABLET | ORAL | 3 refills | Status: DC

## 2018-03-14 MED ORDER — OMEPRAZOLE 40 MG PO CPDR: 40.0000 mg | DELAYED_RELEASE_CAPSULE | Freq: Every day | ORAL | 3 refills | Status: DC

## 2018-03-14 MED ORDER — LEVOTHYROXINE SODIUM 200 MCG PO TABS: 200.0000 ug | ORAL_TABLET | Freq: Every day | ORAL | 3 refills | Status: DC

## 2018-03-14 NOTE — Progress Notes (Addendum)
Chief Complaint  Patient presents with  . Establish Care   New patient establish care: former PCP Dr. Clayborn Bigness  1. HTN BP sl elevated today on ziac 2.5-6.25, Imdur 30, Ramipril 10 mg qd   2. DM 2 sine 1990s A1C 6.0 01/11/18 on Jardiacne 25 mg x years qd, amaryl 1/2 in am and pm, metformin 1000 mg bid x 2-3 years but c/o penile irritation x 1 month ? If due to [facility] though he has been on this a while. He also c/o diarrhea colonoscopy 05/24/17 neg microscopic colitis   3. CAD noted since 2017 with LAD with 100% blockage no PCI f/u with Dr. Saunders Revel and [facility] cards on lipitor 20 mg qd   4. OSA on cpap since 1990s  5. Hypothyroidism on levo 200 mcg qd and cytomel 5 mg qod  6. 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 Tami Ribas and [place] [facility] bx negative but if continues will excise per [facility] Dr. Ihor Austin   Review of Systems  Constitutional: Negative for weight loss.  HENT: Negative for hearing loss.   Eyes: Negative for blurred vision.  Respiratory: Negative for shortness of breath.   Cardiovascular: Negative for chest pain.  Gastrointestinal: Positive for diarrhea.  Genitourinary:       Penile irritation    Musculoskeletal: Positive for back pain and joint pain.  Skin: Negative for rash.  Neurological: Negative for headaches.  Psychiatric/Behavioral: Negative for depression and memory loss.   Past Medical History:  Diagnosis Date  . Arthritis   . Complication of anesthesia    SEVERE SORE THROAT AFTER BIOPSY 2010  . Coronary artery disease    Chronic total occlusion of mid LAD  . Diabetes mellitus without complication ([place])   . History of shingles   . Hyperlipidemia   . Hypertension   . Hypothyroidism   . OSA on CPAP   . Tongue cancer ([place])    Squamous cell CA of tongue   Past Surgical History:  Procedure Laterality Date  . BIOPSY TONGUE    . CARDIAC CATHETERIZATION N/A 11/10/2015   Procedure: Left Heart Cath and Coronary  Angiography;  Surgeon: Wellington Hampshire, MD;  Location: [facility] CV LAB;  Service: Cardiovascular;  Laterality: N/A;  . COLONOSCOPY  05/2006  . COLONOSCOPY WITH PROPOFOL N/A 05/24/2017   Procedure: COLONOSCOPY WITH PROPOFOL;  Surgeon: Lollie Sails, MD;  Location: [facility] ENDOSCOPY;  Service: Endoscopy;  Laterality: N/A;  . EXCISION OF TONGUE LESION N/A 11/11/2015   Procedure: EXCISION OF TONGUE LESION/ WITH FROZEN SECTION;  Surgeon: Beverly Gust, MD;  Location: [facility] ORS;  Service: ENT;  Laterality: N/A;   Family History  Problem Relation Age of Onset  . Myelodysplastic syndrome Mother   . Emphysema Father    Social History   Socioeconomic History  . Marital status: Married    Spouse name: Not on file  . Number of children: Not on file  . Years of education: Not on file  . Highest education level: Not on file  Occupational History  . Not on file  Social Needs  . Financial resource strain: Not on file  . Food insecurity:    Worry: Not on file    Inability: Not on file  . Transportation needs:    Medical: Not on file    Non-medical: Not on file  Tobacco Use  . Smoking status: Never Smoker  . Smokeless tobacco: Never Used  Substance and Sexual Activity  .  Alcohol use: Yes    Alcohol/week: 0.0 standard drinks    Comment: occassionally   . Drug use: No  . Sexual activity: Not on file  Lifestyle  . Physical activity:    Days per week: Not on file    Minutes per session: Not on file  . Stress: Not on file  Relationships  . Social connections:    Talks on phone: Not on file    Gets together: Not on file    Attends religious service: Not on file    Active member of club or organization: Not on file    Attends meetings of clubs or organizations: Not on file    Relationship status: Not on file  . Intimate partner violence:    Fear of current or ex partner: Not on file    Emotionally abused: Not on file    Physically abused: Not on file    Forced sexual activity:  Not on file  Other Topics Concern  . Not on file  Social History Narrative  . Not on file   Current Meds  Medication Sig  . aspirin 81 MG tablet Take 81 mg by mouth daily.  Marland Kitchen atorvastatin (LIPITOR) 20 MG tablet TAKE ONE TABLET BY MOUTH DAILY  . bisoprolol-hydrochlorothiazide (ZIAC) 2.5-6.25 MG tablet Take 1 tablet by mouth daily.  . clopidogrel (PLAVIX) 75 MG tablet TAKE ONE TABLET BY MOUTH DAILY  . Fluorouracil 5 % SOLN Apply to scalp TWICE DAILY FOR 5-6 days  . glimepiride (AMARYL) 2 MG tablet TAKE ONE-HALF TABLET BY MOUTH EVERY MORNING AND TAKE ONE-HALF TABLET BY MOUTH WITH SUPPER  . isosorbide mononitrate (IMDUR) 30 MG 24 hr tablet Take 1 tablet (30 mg total) by mouth 2 (two) times daily.  Marland Kitchen JARDIANCE 25 MG TABS tablet TAKE ONE TABLET BY MOUTH DAILY  . levothyroxine (SYNTHROID, LEVOTHROID) 200 MCG tablet TAKE ONE TABLET BY MOUTH DAILY  . liothyronine (CYTOMEL) 5 MCG tablet TAKE ONE TAB IN AM ALONG WITH LEVOTHYROXINE  . metFORMIN (GLUCOPHAGE) 1000 MG tablet Take 1 tablet (1,000 mg total) by mouth daily with breakfast.  . Multiple Vitamins-Minerals (CENTRUM SILVER 50+MEN PO) Take 1 tablet by mouth every morning.   . nystatin-triamcinolone (MYCOLOG II) cream Apply 1 application topically 2 (two) times daily.  Marland Kitchen omeprazole (PRILOSEC) 40 MG capsule Take 1 capsule (40 mg total) by mouth daily.  . ramipril (ALTACE) 10 MG capsule Take 2 capsules (20 mg total) by mouth daily.  . [DISCONTINUED] metFORMIN (GLUCOPHAGE) 1000 MG tablet TAKE ONE TABLET BY MOUTH TWICE A DAY   Allergies  Allergen Reactions  . Penicillins Hives, Rash, Other (See Comments) and Swelling    Has patient had a PCN reaction causing immediate rash, facial/tongue/throat swelling, SOB or lightheadedness with hypotension: Yes Has patient had a PCN reaction causing severe rash involving mucus membranes or skin necrosis: No Has patient had a PCN reaction that required hospitalization No Has patient had a PCN reaction occurring  within the last 10 years: No If all of the above answers are "NO", then may proceed with Cephalosporin use.    Recent Results (from the past 2160 hour(s))  Basic metabolic panel     Status: Abnormal   Collection Time: 12/21/17 11:44 AM  Result Value Ref Range   Glucose 97 65 - 99 mg/dL   BUN 17 8 - 27 mg/dL   Creatinine, Ser 1.24 0.76 - 1.27 mg/dL   GFR calc non Af Amer 58 (L) >59 mL/min/1.73   GFR calc Af  Amer 67 >59 mL/min/1.73   BUN/Creatinine Ratio 14 10 - 24   Sodium 142 134 - 144 mmol/L   Potassium 5.0 3.5 - 5.2 mmol/L   Chloride 101 96 - 106 mmol/L   CO2 25 20 - 29 mmol/L   Calcium 9.3 8.6 - 10.2 mg/dL  ALT     Status: None   Collection Time: 01/05/18  8:10 AM  Result Value Ref Range   ALT 39 0 - 44 U/L    Comment: Performed at [facility], [address] [place], [place] [address]  Lipid panel     Status: Abnormal   Collection Time: 01/05/18  8:10 AM  Result Value Ref Range   Cholesterol 81 0 - 200 mg/dL   Triglycerides 150 (H) <150 mg/dL   HDL 22 (L) >40 mg/dL   Total CHOL/HDL Ratio 3.7 RATIO   VLDL 30 0 - 40 mg/dL   LDL Cholesterol 29 0 - 99 mg/dL    Comment:        Total Cholesterol/HDL:CHD Risk Coronary Heart Disease Risk Table                     Men   Women  1/2 Average Risk   3.4   3.3  Average Risk       5.0   4.4  2 X Average Risk   9.6   7.1  3 X Average Risk  23.4   11.0        Use the calculated Patient Ratio above and the CHD Risk Table to determine the patient's CHD Risk.        ATP III CLASSIFICATION (LDL):  <100     mg/dL   Optimal  100-129  mg/dL   Near or Above                    Optimal  130-159  mg/dL   Borderline  160-189  mg/dL   High  >190     mg/dL   Very High Performed at [facility], [address] [place], [place] [address]   Basic metabolic panel     Status: Abnormal   Collection Time: 01/05/18  8:10 AM  Result Value Ref Range   Sodium 140 135 - 145 mmol/L   Potassium 4.8 3.5 - 5.1 mmol/L    Chloride 104 98 - 111 mmol/L   CO2 28 22 - 32 mmol/L   Glucose, Bld 111 (H) 70 - 99 mg/dL   BUN 15 8 - 23 mg/dL   Creatinine, Ser 1.15 0.61 - 1.24 mg/dL   Calcium 9.0 8.9 - 10.3 mg/dL   GFR calc non Af Amer >60 >60 mL/min   GFR calc Af Amer >60 >60 mL/min    Comment: (NOTE) The eGFR has been calculated using the CKD EPI equation. This calculation has not been validated in all clinical situations. eGFR's persistently <60 mL/min signify possible Chronic Kidney Disease.    Anion gap 8 5 - 15    Comment: Performed at [facility], [address] [place], [place] [address]  UA/M w/rflx Culture, Routine     Status: Abnormal   Collection Time: 01/11/18 10:16 AM  Result Value Ref Range   Specific Gravity, UA 1.021 1.005 - 1.030   pH, UA 5.0 5.0 - 7.5   Color, UA Yellow Yellow   Appearance Ur Clear Clear   Leukocytes, UA Negative Negative   Protein, UA Negative Negative/Trace   Glucose,  UA 3+ (A) Negative   Ketones, UA Negative Negative   RBC, UA Negative Negative   Bilirubin, UA Negative Negative   Urobilinogen, Ur 0.2 0.2 - 1.0 mg/dL   Nitrite, UA Negative Negative   Microscopic Examination Comment     Comment: Microscopic follows if indicated.   Microscopic Examination See below:     Comment: Microscopic was indicated and was performed.   Urinalysis Reflex Comment     Comment: This specimen will not reflex to a Urine Culture.  Microscopic Examination     Status: None   Collection Time: 01/11/18 10:16 AM  Result Value Ref Range   WBC, UA 0-5 0 - 5 /hpf   RBC, UA None seen 0 - 2 /hpf   Epithelial Cells (non renal) 0-10 0 - 10 /hpf   Casts None seen None seen /lpf   Bacteria, UA None seen None seen/Few  POCT HgB A1C     Status: Abnormal   Collection Time: 01/11/18 10:36 AM  Result Value Ref Range   Hemoglobin A1C 6.0 (A) 4.0 - 5.6 %   HbA1c POC (<> result, manual entry)     HbA1c, POC (prediabetic range)     HbA1c, POC (controlled diabetic range)    CBC with  Differential/Platelet     Status: None   Collection Time: 01/11/18 11:16 AM  Result Value Ref Range   WBC 7.9 3.4 - 10.8 x10E3/uL   RBC 5.49 4.14 - 5.80 x10E6/uL   Hemoglobin 16.2 13.0 - 17.7 g/dL   Hematocrit 49.0 37.5 - 51.0 %   MCV 89 79 - 97 fL   MCH 29.5 26.6 - 33.0 pg   MCHC 33.1 31.5 - 35.7 g/dL   RDW 13.0 12.3 - 15.4 %   Platelets 199 150 - 450 x10E3/uL   Neutrophils 62 Not Estab. %   Lymphs 29 Not Estab. %   Monocytes 8 Not Estab. %   Eos 1 Not Estab. %   Basos 0 Not Estab. %   Neutrophils Absolute 4.9 1.4 - 7.0 x10E3/uL   Lymphocytes Absolute 2.3 0.7 - 3.1 x10E3/uL   Monocytes Absolute 0.7 0.1 - 0.9 x10E3/uL   EOS (ABSOLUTE) 0.1 0.0 - 0.4 x10E3/uL   Basophils Absolute 0.0 0.0 - 0.2 x10E3/uL   Immature Granulocytes 0 Not Estab. %   Immature Grans (Abs) 0.0 0.0 - 0.1 x10E3/uL  TSH     Status: None   Collection Time: 01/11/18 11:16 AM  Result Value Ref Range   TSH 0.801 0.450 - 4.500 uIU/mL  T4, free     Status: None   Collection Time: 01/11/18 11:16 AM  Result Value Ref Range   Free T4 1.26 0.82 - 1.77 ng/dL  PSA     Status: None   Collection Time: 01/11/18 11:16 AM  Result Value Ref Range   Prostate Specific Ag, Serum 1.2 0.0 - 4.0 ng/mL    Comment: Roche ECLIA methodology. According to the American Urological Association, Serum PSA should decrease and remain at undetectable levels after radical prostatectomy. The AUA defines biochemical recurrence as an initial PSA value 0.2 ng/mL or greater followed by a subsequent confirmatory PSA value 0.2 ng/mL or greater. Values obtained with different assay methods or kits cannot be used interchangeably. Results cannot be interpreted as absolute evidence of the presence or absence of malignant disease.    Objective  Body mass index is 37.97 kg/m. Wt Readings from Last 3 Encounters:  03/14/18 264 lb 9.6 oz (120 kg)  02/03/18 258  lb (117 kg)  01/11/18 258 lb 9.6 oz (117.3 kg)   Temp Readings from Last 3 Encounters:   03/14/18 98.2 F (36.8 C) (Oral)  05/24/17 (!) 97.5 F (36.4 C) (Tympanic)  11/21/15 98.2 F (36.8 C) (Tympanic)   BP Readings from Last 3 Encounters:  03/14/18 (!) 144/60  02/03/18 130/80  01/11/18 136/63   Pulse Readings from Last 3 Encounters:  03/14/18 (!) 57  02/03/18 60  01/11/18 67    Physical Exam  Constitutional: He is oriented to person, place, and time. Vital signs are normal. He appears well-developed and well-nourished. He is cooperative.  HENT:  Head: Normocephalic and atraumatic.  Mouth/Throat: Oropharynx is clear and moist and mucous membranes are normal.  Eyes: Pupils are equal, round, and reactive to light. Conjunctivae are normal.  Cardiovascular: Normal rate, regular rhythm and normal heart sounds.  Pulmonary/Chest: Effort normal and breath sounds normal.  Neurological: He is alert and oriented to person, place, and time. Gait normal.  Skin: Skin is warm, dry and intact.  Psychiatric: He has a normal mood and affect. His speech is normal and behavior is normal. Judgment and thought content normal. Cognition and memory are normal.  Nursing note and vitals reviewed.   Assessment   1. HTN 2. DM 2 sine 1990s A1C 6.0 01/11/18  3. C/w yeast/balanitis with Jardiance use x 1 month   4. CAD noted since 2017 with LAD with 100% blockage no PCI, stable angina no pain  5. OSA on cpap since 1990s  6. Diarrhea could be related to Metformin use  7. Hypothyroidism  8. 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 Tami Ribas and [place] [facility] bx negative but if continues will excise per [facility] Dr. Ihor Austin  9. HM Plan  1. Cont ziac 2.5-.6.25 mg qd  2. And 3  On metformin 1000 mg bid reduce to 1000 mg qd, Amaryl 1 mg bid, Jardiance 25  Try Diflucan pill and Miconazole if not better add Abx and consider d/c Jardiance if recurs  4.  Saw [facility] rec. Med management no PCI f/u Dr. Saunders Revel  Currently no CP  or SOB.  On statin and aspirin and imdur  5. On cpap  6. Reduce metformin doubt infectious wife tested neg  Monitor could consider prn immodium  7. On levo 200 mcg and cytomel 5 mg qod from former PCP 8. F/u [facility] Dr. Ihor Austin  9.  In 3 months check CMET. Lipid, TSH, vitamin D, A1C, consider hep B/C/MMR pt wants to wait on these   Flu shot 01/11/18  prevnar had 02/03/18  Tdap 12/27/13 call and disc with pharmacy had at [facility] per prior notes  pna 23 (per prior notes former PCP Dr. Clayborn Bigness had in 2010) and shingrix not had  zostervax per pt had in the past   Colonoscopy 05/24/2017 diverticulosis no etiology for diarrhea  -repeat in 10 years Dr. Gustavo Lah  PSA 1.2 01/11/18 normal f/u urology Dr. Bernardo Heater, PSA 0.9 05/10/14   Dr. Sharlet Salina  Derm Dr. Evorn Gong h/o [place] forehead and chest  Urology Dr. Bernardo Heater  ENT Dr. Bernestine Amass [facility]  [place] [facility] Dr. Merla Riches  Dentist Dr. Eugenie Birks  Cardiology Dr. Saunders Revel  GI Dr. Gustavo Lah   H/o elevated lfts AST 51 and ALT 77 10/11/12  A1C 7.0 06/02/12  H/o thyroglobulin elevation 09/15/12 11.5 elevated nl 0.0-0.9  He was on ambien 10 mg qhs for sleep,  flexeril 5 mg bid prn muscle spasms and mobic 7.5 mg bid for back pain, imdur ER 30 mg qd for CAD Chronic low back pain  DM2  He was on victoza in the past in 10/2014 1.8 mg qd   Records Dr. Clayborn Bigness requested today  Provider: Dr. Olivia Mackie McLean-Scocuzza-[facility]

## 2018-03-14 NOTE — Telephone Encounter (Signed)
Copied from [place] [id number]. Topic: Quick Communication - Rx Refill/Question >> Mar 14, 2018 12:56 PM [name] L wrote: Medication:  liothyronine (CYTOMEL) 5 MCG tablet  Tony Lawrence, from [facility] calling.  Last RX was for 1 tablet daily, and this one states 1 tablet every other day with no explanation for change.  Pharmacy is wanting to make sure what they have is correct.  Preferred Pharmacy (with phone number or street name): [facility], [address] [phone] (Phone) [phone] (Fax)  Agent: Please be advised that RX refills may take up to 3 business days. We ask that you follow-up with your pharmacy.

## 2018-03-14 NOTE — Patient Instructions (Addendum)
Shingles vaccines consider shingrix x 2 doses at your pharmacy   Let me know if your private area does not get better   Balanitis Balanitis is swelling and irritation (inflammation) of the head of the penis (glans penis). The condition may also cause inflammation of the skin around the glans penis (foreskin) in men who have not been circumcised. It may develop because of an infection or another medical condition. Balanitis occurs most often among men who have not had their foreskin removed (uncircumcised men). Balanitis sometimes causes scarring of the penis or foreskin, which can require surgery. Untreated balanitis can increase the risk of penile cancer. What are the causes? Common causes of this condition include:  Poor personal hygiene, especially in uncircumcised men. Not cleaning the glans penis and foreskin well can result in buildup of bacteria, viruses, and yeast, which can lead to infection and inflammation.  Irritation and lack of air flow due to fluid (smegma) that can build up on the glans penis.  Other causes include:  Chemical irritation from products such as soaps or shower gels (especially those that have fragrance), condoms, personal lubricants, petroleum jelly, spermicides, or fabric softeners.  Skin conditions, such as eczema, dermatitis, and psoriasis.  Allergies to medicines, such as tetracycline and sulfa drugs.  Certain medical conditions, including liver cirrhosis, congestive heart failure, diabetes, and kidney disease.  Infections, such as candidiasis, HPV (human papillomavirus), herpes simplex, gonorrhea, and syphilis.  Severe obesity.  What increases the risk? The following factors may make you more likely to develop this condition:  Having diabetes. This is the most common risk factor.  Having a tight foreskin that is difficult to pull back (retract) past the glans.  Having sexual intercourse without using a condom.  What are the signs or  symptoms? Symptoms of this condition include:  Discharge from under the foreskin.  A bad smell.  Pain or difficulty retracting the foreskin.  Tenderness, redness, and swelling of the glans.  A rash or sores on the glans or foreskin.  Itchiness.  Inability to get an erection due to pain.  Difficulty urinating.  Scarring of the penis or foreskin, in some cases.  How is this diagnosed? This condition may be diagnosed based on:  A physical exam.  Testing a swab of discharge to check for bacterial or fungal infection.  Blood tests: ? To check for viruses that can cause balanitis. ? To check your blood sugar (glucose) level. High blood glucose could be a sign of diabetes, which can cause balanitis.  How is this treated? Treatment for balanitis depends on the cause. Treatment may include:  Improving personal hygiene. Your health care provider may recommend sitting in a bath of warm water that is deep enough to cover your hips and buttocks (sitz bath).  Medicines such as: ? Creams or ointments to reduce swelling (steroids) or to treat an infection. ? Antibiotic medicine. ? Antifungal medicine.  Surgery to remove or cut the foreskin (circumcision). This may be done if you have scarring on the foreskin that makes it difficult to retract.  Controlling other medical problems that may be causing your condition or making it worse.  Follow these instructions at home:  Do not have sex until the condition clears up, or until your health care provider approves.  Keep your penis clean and dry. Take sitz baths as recommended by your health care provider.  Avoid products that irritate your skin or make symptoms worse, such as soaps and shower gels that have fragrance.  Take over-the-counter and prescription medicines only as told by your health care provider. ? If you were prescribed an antibiotic medicine or a cream or ointment, use it as told by your health care provider. Do not  stop using your medicine, cream, or ointment even if you start to feel better. ? Do not drive or use heavy machinery while taking prescription pain medicine. Contact a health care provider if:  Your symptoms get worse or do not improve with home care.  You develop chills or a fever.  You have trouble urinating.  You cannot retract your foreskin. Get help right away if:  You develop severe pain.  You are unable to urinate. Summary  Balanitis is inflammation of the head of the penis (glans penis) caused by irritation or infection.  Balanitis causes pain, redness, and swelling of the glans penis.  This condition is most common among uncircumcised men who do not keep their glans penis clean and in men who have diabetes.  Treatment may include creams or ointments.  Good hygiene is important for prevention. This includes pulling back the foreskin when washing your penis. This information is not intended to replace advice given to you by your health care provider. Make sure you discuss any questions you have with your health care provider. Document Released: 08/29/2008 Document Revised: 03/01/2016 Document Reviewed: 03/01/2016 Elsevier Interactive Patient Education  2017 [place] Zoster (Shingles) Vaccine, RZV: What You Need to Know 1. Why get vaccinated? Shingles (also called herpes zoster, or just zoster) is a painful skin rash, often with blisters. Shingles is caused by the varicella zoster virus, the same virus that causes chickenpox. After you have chickenpox, the virus stays in your body and can cause shingles later in life. You can't catch shingles from another person. However, a person who has never had chickenpox (or chickenpox vaccine) could get chickenpox from someone with shingles. A shingles rash usually appears on one side of the face or body and heals within 2 to 4 weeks. Its main symptom is pain, which can be severe. Other symptoms can include fever,  headache, chills and upset stomach. Very rarely, a shingles infection can lead to pneumonia, hearing problems, blindness, brain inflammation (encephalitis), or death. For about 1 person in 5, severe pain can continue even long after the rash has cleared up. This long-lasting pain is called post-herpetic neuralgia (PHN). Shingles is far more common in people 81 years of age and older than in younger people, and the risk increases with age. It is also more common in people whose immune system is weakened because of a disease such as cancer, or by drugs such as steroids or chemotherapy. At least 1 million people a year in the [place] get shingles. 2. Shingles vaccine (recombinant) Recombinant shingles vaccine was approved by FDA in 2017 for the prevention of shingles. In clinical trials, it was more than 90% effective in preventing shingles. It can also reduce the likelihood of PHN. Two doses, 2 to 6 months apart, are recommended for adults 28 and older. This vaccine is also recommended for people who have already gotten the live shingles vaccine (Zostavax). There is no live virus in this vaccine. 3. Some people should not get this vaccine Tell your vaccine provider if you:  Have any severe, life-threatening allergies. A person who has ever had a life-threatening allergic reaction after a dose of recombinant shingles vaccine, or has a severe allergy to any component of this vaccine, may be advised  not to be vaccinated. Ask your health care provider if you want information about vaccine components.  Are pregnant or breastfeeding. There is not much information about use of recombinant shingles vaccine in pregnant or nursing women. Your healthcare provider might recommend delaying vaccination.  Are not feeling well. If you have a mild illness, such as a cold, you can probably get the vaccine today. If you are moderately or severely ill, you should probably wait until you recover. Your doctor can  advise you.  4. Risks of a vaccine reaction With any medicine, including vaccines, there is a chance of reactions. After recombinant shingles vaccination, a person might experience:  Pain, redness, soreness, or swelling at the site of the injection  Headache, muscle aches, fever, shivering, fatigue  In clinical trials, most people got a sore arm with mild or moderate pain after vaccination, and some also had redness and swelling where they got the shot. Some people felt tired, had muscle pain, a headache, shivering, fever, stomach pain, or nausea. About 1 out of 6 people who got recombinant zoster vaccine experienced side effects that prevented them from doing regular activities. Symptoms went away on their own in about 2 to 3 days. Side effects were more common in younger people. You should still get the second dose of recombinant zoster vaccine even if you had one of these reactions after the first dose. Other things that could happen after this vaccine:  People sometimes faint after medical procedures, including vaccination. Sitting or lying down for about 15 minutes can help prevent fainting and injuries caused by a fall. Tell your provider if you feel dizzy or have vision changes or ringing in the ears.  Some people get shoulder pain that can be more severe and longer-lasting than routine soreness that can follow injections. This happens very rarely.  Any medication can cause a severe allergic reaction. Such reactions to a vaccine are estimated at about 1 in a million doses, and would happen within a few minutes to a few hours after the vaccination. As with any medicine, there is a very remote chance of a vaccine causing a serious injury or death. The safety of vaccines is always being monitored. For more information, visit: http://www.aguilar.org/ 5. What if there is a serious problem? What should I look for?  Look for anything that concerns you, such as signs of a severe allergic  reaction, very high fever, or unusual behavior. Signs of a severe allergic reaction can include hives, swelling of the face and throat, difficulty breathing, a fast heartbeat, dizziness, and weakness. These would usually start a few minutes to a few hours after the vaccination. What should I do?  If you think it is a severe allergic reaction or other emergency that can't wait, call 9-1-1 and get to the nearest hospital. Otherwise, call your health care provider. Afterward, the reaction should be reported to the Vaccine Adverse Event Reporting System (VAERS). Your doctor should file this report, or you can do it yourself through the VAERS web site atwww.vaers.https://www.bray.com/ by calling 253-885-0339. VAERS does not give medical advice. 6. How can I learn more?  Ask your healthcare provider. He or she can give you the vaccine package insert or suggest other sources of information.  Call your local or state health department.  Contact the Centers for Disease Control and Prevention (CDC): ? Call 720-328-3213 (1-800-CDC-INFO) or ? Visit the CDC's website at http://hunter.com/ CDC Vaccine Information Statement (VIS) Recombinant Zoster Vaccine (06/07/2016) This information is not  intended to replace advice given to you by your health care provider. Make sure you discuss any questions you have with your health care provider. Document Released: 06/22/2016 Document Revised: 06/22/2016 Document Reviewed: 06/22/2016 Elsevier Interactive Patient Education  [employer].

## 2018-03-14 NOTE — Progress Notes (Signed)
Pre visit review using our clinic review tool, if applicable. No additional management support is needed unless otherwise documented below in the visit note. 

## 2018-03-15 NOTE — Telephone Encounter (Signed)
Per office note on 03-14-2018, medication was changed Liothyronine was changed to 5 MCG tablet every other day. / See Dr. Mclean-Scocuzza's note.   / Wells Guiles at the pharmacy was notified

## 2018-03-21 ENCOUNTER — Encounter: Payer: Self-pay | Admitting: Internal Medicine

## 2018-03-21 ENCOUNTER — Encounter: Payer: Self-pay | Admitting: Family Medicine

## 2018-03-21 ENCOUNTER — Ambulatory Visit (INDEPENDENT_AMBULATORY_CARE_PROVIDER_SITE_OTHER): Payer: Medicare Other | Admitting: Family Medicine

## 2018-03-21 VITALS — BP 132/68 | HR 61 | Temp 98.1°F | Ht 70.0 in | Wt 259.0 lb

## 2018-03-21 DIAGNOSIS — Z9989 Dependence on other enabling machines and devices: Secondary | ICD-10-CM

## 2018-03-21 DIAGNOSIS — Z85828 Personal history of other malignant neoplasm of skin: Secondary | ICD-10-CM

## 2018-03-21 DIAGNOSIS — R197 Diarrhea, unspecified: Secondary | ICD-10-CM

## 2018-03-21 DIAGNOSIS — R102 Pelvic and perineal pain: Secondary | ICD-10-CM

## 2018-03-21 DIAGNOSIS — Z85810 Personal history of malignant neoplasm of tongue: Secondary | ICD-10-CM

## 2018-03-21 DIAGNOSIS — G4733 Obstructive sleep apnea (adult) (pediatric): Secondary | ICD-10-CM | POA: Insufficient documentation

## 2018-03-21 DIAGNOSIS — N50819 Testicular pain, unspecified: Secondary | ICD-10-CM

## 2018-03-21 DIAGNOSIS — R1030 Lower abdominal pain, unspecified: Secondary | ICD-10-CM | POA: Diagnosis not present

## 2018-03-21 DIAGNOSIS — E039 Hypothyroidism, unspecified: Secondary | ICD-10-CM | POA: Insufficient documentation

## 2018-03-21 DIAGNOSIS — R1024 Suprapubic pain: Secondary | ICD-10-CM

## 2018-03-21 DIAGNOSIS — K219 Gastro-esophageal reflux disease without esophagitis: Secondary | ICD-10-CM | POA: Insufficient documentation

## 2018-03-21 HISTORY — DX: Personal history of malignant neoplasm of tongue: Z85.810

## 2018-03-21 HISTORY — DX: Diarrhea, unspecified: R19.7

## 2018-03-21 HISTORY — DX: Personal history of other malignant neoplasm of skin: Z85.828

## 2018-03-21 LAB — URINALYSIS, ROUTINE W REFLEX MICROSCOPIC
BILIRUBIN URINE: NEGATIVE
Hgb urine dipstick: NEGATIVE
Ketones, ur: NEGATIVE
Leukocytes, UA: NEGATIVE
Nitrite: NEGATIVE
PH: 5.5 (ref 5.0–8.0)
RBC / HPF: NONE SEEN (ref 0–?)
Specific Gravity, Urine: <=1.005 — AB (ref 1.000–1.030)
Total Protein, Urine: NEGATIVE
UROBILINOGEN UA: 0.2 (ref 0.0–1.0)
Urine Glucose: >=1000 — AB
WBC UA: NONE SEEN (ref 0–?)

## 2018-03-21 MED ORDER — CIPROFLOXACIN HCL 500 MG PO TABS: 500.0000 mg | ORAL_TABLET | Freq: Two times a day (BID) | ORAL | 0 refills | Status: DC

## 2018-03-21 NOTE — Progress Notes (Signed)
Subjective:    Patient ID: Tony Lawrence, male    DOB: 11/22/1945, 72 y.o.   MRN: [id number]  HPI  Patient was asked to clinic due to penile pain, dx with Balanitis on 03/14/2018.  Patient was treated with anti-yeast cream and Diflucan, patient states the irritation on tip of penis has improved.  Now reports bilateral testicle pain and also lower abdominal pain, this seems worse at night.  Patient states it has been happening off and on over the past week.  Denies any burning with urination, denies any diarrhea or constipation.  Denies nausea or vomiting.  Denies any fever or chills.  Patient usually wears boxer shorts for underwear.  Patient does see urology usually every 1 to 2 years, his appointment is not until January 2020.  Recent lab work reviewed: CBC Latest Ref Rng & Units 01/11/2018 11/11/2015 11/04/2015  WBC 3.4 - 10.8 x10E3/uL 7.9 - 8.4  Hemoglobin 13.0 - 17.7 g/dL 16.2 17.0 16.5  Hematocrit 37.5 - 51.0 % 49.0 50.0 48.5  Platelets 150 - 450 x10E3/uL 199 - 161    BMP Latest Ref Rng & Units 01/05/2018 12/21/2017 11/11/2015  Glucose 70 - 99 mg/dL 111(H) 97 171(H)  BUN 8 - 23 mg/dL 15 17 -  Creatinine 0.61 - 1.24 mg/dL 1.15 1.24 -  BUN/Creat Ratio 10 - 24 - 14 -  Sodium 135 - 145 mmol/L 140 142 139  Potassium 3.5 - 5.1 mmol/L 4.8 5.0 4.2  Chloride 98 - 111 mmol/L 104 101 -  CO2 22 - 32 mmol/L 28 25 -  Calcium 8.9 - 10.3 mg/dL 9.0 9.3 -    Patient Active Problem List   Diagnosis Date Noted  . Gastroesophageal reflux disease without esophagitis 03/21/2018  . History of skin cancer 03/21/2018  . History of tongue cancer 03/21/2018  . OSA on CPAP 03/21/2018  . Diarrhea 03/21/2018  . Hypothyroidism 03/21/2018  . Coronary artery disease of native artery of native heart with stable angina pectoris ([place]) 12/22/2017  . Essential hypertension 12/22/2017  . Hyperlipidemia LDL goal <70 12/22/2017  . Cancer of ventral surface of tongue ([place]) 11/04/2015  . Malignant neoplasm of  tongue, tip and lateral border ([place]) 11/04/2015  . Primary osteoarthritis of both knees 05/13/2014  . Other intervertebral disc displacement, lumbar region 01/16/2014  . Neuritis or radiculitis due to rupture of lumbar intervertebral disc 01/16/2014  . Arthritis, degenerative 03/12/2013  . Diabetes mellitus ([place]) 03/12/2013  . Benign prostatic hyperplasia with urinary obstruction 03/06/2012   Social History   Tobacco Use  . Smoking status: Never Smoker  . Smokeless tobacco: Never Used  Substance Use Topics  . Alcohol use: Yes    Alcohol/week: 0.0 standard drinks    Comment: occassionally    Review of Systems   Constitutional: Negative for chills, fatigue and fever.  HENT: Negative for congestion, ear pain, sinus pain and sore throat.   Eyes: Negative.   Respiratory: Negative for cough, shortness of breath and wheezing.   Cardiovascular: Negative for chest pain, palpitations and leg swelling.  Gastrointestinal: Negative for diarrhea, nausea and vomiting.  Genitourinary: Negative for dysuria, frequency and urgency. +lower ABD pain, +testicle pain Musculoskeletal: Negative for arthralgias and myalgias.  Skin: Negative for color change, pallor and rash.  Neurological: Negative for syncope, light-headedness and headaches.  Psychiatric/Behavioral: The patient is not nervous/anxious.       Objective:   Physical Exam  Constitutional: He is oriented to person, place, and time. He appears well-nourished. No distress.  HENT:  Head: Normocephalic and atraumatic.  Eyes: Conjunctivae and EOM are normal. No scleral icterus.  Neck: Neck supple. No tracheal deviation present.  Cardiovascular: Normal rate and regular rhythm.  Pulmonary/Chest: Effort normal and breath sounds normal.  Abdominal: Hernia confirmed negative in the right inguinal area and confirmed negative in the left inguinal area.    Generalized suprapubic abdominal pain represented by a green circle on diagram    Genitourinary: Right testis shows tenderness. Right testis shows no mass. Left testis shows tenderness. Left testis shows no mass. Uncircumcised. No penile erythema or penile tenderness. No discharge found.  Genitourinary Comments: Mild tenderness of testicles when palpated, right more than left. Testicles do not appear swollen.  No obvious masses palpated.  Neurological: He is alert and oriented to person, place, and time.  Skin: Skin is warm and dry. No pallor.  Psychiatric: He has a normal mood and affect. His behavior is normal.  Nursing note and vitals reviewed.     Vitals:   03/21/18 1334  BP: 132/68  Pulse: 61  Temp: 98.1 F (36.7 C)  SpO2: 95%   Assessment & Plan:   Testicle pain/suprapubic pain/lower abdominal pain - urinalysis done in clinic and is unremarkable.  We will also do urine culture.  Discussed different options including imaging of the testicles with ultrasound and also CT of abdomen pelvis.  Patient would prefer not to do imaging at this time.  Location of patient's pain makes me think of differential diagnoses including: Diverticulitis, UTI, prostatitis, epididymitis.  Patient declines any lab work in clinic today.  Patient will take Cipro 500 mg twice daily for 10 days and this medication will cover many of those differential diagnoses.     Patient will follow-up in about 1 week to see how his symptoms are doing.  Patient I both agree that if he continues to have pain, we will proceed forward with imaging at that time.

## 2018-03-23 LAB — URINE CULTURE
MICRO NUMBER:: 91424911
Result:: NO GROWTH
SPECIMEN QUALITY: ADEQUATE

## 2018-03-27 NOTE — Addendum Note (Signed)
Addended by: Philis Nettle on: 03/27/2018 05:06 PM   Modules accepted: Orders

## 2018-03-28 ENCOUNTER — Telehealth: Payer: Self-pay | Admitting: *Deleted

## 2018-03-28 NOTE — Telephone Encounter (Signed)
Please note this does not want [place] I did not call the pt yesterday someone else must have  G[facility] evaluated pt on 03/21/18  Discuss with pt will he schedule appt with ortho or does he want [place] to?   TSM

## 2018-03-28 NOTE — Telephone Encounter (Signed)
Copied from [place] [id number]. Topic: General - Inquiry >> Mar 28, 2018 10:23 AM Conception Chancy, NT wrote: Reason for CRM: patient is calling and states that Dr. Aundra Dubin spoke with his wife yesterday about scheduling a ultrasound. Patient states he schedule a appointment with his orthopedic and would like to see how that appointment goes before scheduling ultrasound.

## 2018-03-30 NOTE — Telephone Encounter (Signed)
Ok. Thanks!

## 2018-03-30 NOTE — Telephone Encounter (Signed)
Do not schedule ultrasound. He has scheduled an appt with ortho. Thanks!

## 2018-04-03 DIAGNOSIS — M5416 Radiculopathy, lumbar region: Secondary | ICD-10-CM | POA: Diagnosis not present

## 2018-04-03 DIAGNOSIS — M5126 Other intervertebral disc displacement, lumbar region: Secondary | ICD-10-CM | POA: Diagnosis not present

## 2018-04-03 DIAGNOSIS — M5136 Other intervertebral disc degeneration, lumbar region: Secondary | ICD-10-CM | POA: Diagnosis not present

## 2018-04-10 DIAGNOSIS — M5126 Other intervertebral disc displacement, lumbar region: Secondary | ICD-10-CM | POA: Diagnosis not present

## 2018-04-10 DIAGNOSIS — M5136 Other intervertebral disc degeneration, lumbar region: Secondary | ICD-10-CM | POA: Diagnosis not present

## 2018-04-10 DIAGNOSIS — M5416 Radiculopathy, lumbar region: Secondary | ICD-10-CM | POA: Diagnosis not present

## 2018-04-13 ENCOUNTER — Ambulatory Visit: Payer: Medicare Other

## 2018-04-28 ENCOUNTER — Ambulatory Visit (INDEPENDENT_AMBULATORY_CARE_PROVIDER_SITE_OTHER): Payer: Medicare Other | Admitting: Urology

## 2018-04-28 ENCOUNTER — Encounter: Payer: Self-pay | Admitting: Urology

## 2018-04-28 VITALS — BP 161/82 | HR 77 | Ht 70.0 in | Wt 260.1 lb

## 2018-04-28 DIAGNOSIS — N401 Enlarged prostate with lower urinary tract symptoms: Secondary | ICD-10-CM

## 2018-04-28 DIAGNOSIS — R350 Frequency of micturition: Secondary | ICD-10-CM | POA: Diagnosis not present

## 2018-04-28 NOTE — Progress Notes (Signed)
04/28/2018  3:04 PM   Tony Lawrence 03-05-1946 [id number]  Referring provider: McLean-Scocuzza, Nino Glow, MD [place], [place] [address]  Chief Complaint  Patient presents with  . Establish Care    HPI: Tony Lawrence is a 73 y.o. male that presents today to establish care. 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   - Patient is not taking any medications to manage his symptoms  IPSS    Row Name 04/28/18 1400         International Prostate Symptom Score   How often have you had the sensation of not emptying your bladder?  Not at All     How often have you had to urinate less than every two hours?  About half the time     How often have you found you stopped and started again several times when you urinated?  About half the time     How often have you found it difficult to postpone urination?  About half the time     How often have you had a weak urinary stream?  Not at All     How often have you had to strain to start urination?  Less than 1 in 5 times     How many times did you typically get up at night to urinate?  2 Times     Total IPSS Score  12       Quality of Life due to urinary symptoms   If you were to spend the rest of your life with your urinary condition just the way it is now how would you feel about that?  Mostly Satisfied       PMH: Past Medical History:  Diagnosis Date  . Allergy   . Arthritis    low back pain s/p shots and blocks, knee pain  . BCC (basal cell carcinoma of skin)    forehead and chest Dr. Evorn Gong q 6 months   . CAD (coronary artery disease)   . Complication of anesthesia    SEVERE SORE THROAT AFTER BIOPSY 2010  . Coronary artery disease    Chronic total occlusion of mid LAD  . Diabetes mellitus without  complication ([facility])   . GERD (gastroesophageal reflux disease)   . History of chicken pox   . History of shingles   . Hyperlipidemia   . Hypertension   . Hypothyroidism   . OSA on CPAP   . 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 Tami Ribas, [facility]     Surgical History: Past Surgical History:  Procedure Laterality Date  . 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 CARDIAC CATHETERIZATION N/A 11/10/2015   Procedure: Left Heart Cath and Coronary Angiography;  Surgeon: Wellington Hampshire, MD;  Location: [facility];  Service: Cardiovascular;  Laterality: N/A;  . COLONOSCOPY  05/2006  . COLONOSCOPY WITH PROPOFOL N/A 05/24/2017   Procedure: COLONOSCOPY WITH PROPOFOL;  Surgeon: Lollie Sails, MD;  Location: [facility] ENDOSCOPY;  Service: Endoscopy;  Laterality: N/A;  . EXCISION OF TONGUE LESION N/A 11/11/2015   Procedure: EXCISION OF TONGUE LESION/ WITH FROZEN SECTION;  Surgeon: Beverly Gust, MD;  Location: [facility] ORS;  Service: ENT;  Laterality: N/A;    Home Medications:  Allergies as of 04/28/2018      Reactions   Penicillins Hives, Rash, Other (See Comments), Swelling   Has patient had a PCN reaction causing immediate rash, facial/tongue/throat swelling, SOB or lightheadedness with hypotension: Yes Has patient had a PCN reaction causing severe rash involving mucus membranes or skin necrosis: No Has patient had a PCN reaction that required hospitalization No Has patient had a PCN reaction occurring within the last 10 years: No If all of the above answers are "NO", then may proceed with Cephalosporin use.      Medication List       Accurate as of April 28, 2018  3:04 PM. Always use your most recent med list.        aspirin 81 MG tablet Take 81 mg by mouth daily.   atorvastatin 20 MG tablet Commonly known as:  LIPITOR Take 1 tablet (20 mg total) by mouth daily. At night   bisoprolol-hydrochlorothiazide 2.5-6.25 MG  tablet Commonly known as:  ZIAC Take 1 tablet by mouth daily.   CENTRUM SILVER 50+MEN PO Take 1 tablet by mouth every morning.   ciprofloxacin 500 MG tablet Commonly known as:  CIPRO Take 1 tablet (500 mg total) by mouth 2 (two) times daily.   clopidogrel 75 MG tablet Commonly known as:  PLAVIX TAKE ONE TABLET BY MOUTH DAILY   Fluorouracil 5 % Soln Apply to scalp TWICE DAILY FOR 5-6 days   glimepiride 2 MG tablet Commonly known as:  AMARYL TAKE ONE-HALF TABLET BY MOUTH EVERY MORNING AND TAKE ONE-HALF TABLET BY MOUTH WITH SUPPER   isosorbide mononitrate 30 MG 24 hr tablet Commonly known as:  IMDUR Take 1 tablet (30 mg total) by mouth 2 (two) times daily.   JARDIANCE 25 MG Tabs tablet Generic drug:  empagliflozin TAKE ONE TABLET BY MOUTH DAILY   levothyroxine 200 MCG tablet Commonly known as:  SYNTHROID, LEVOTHROID Take 1 tablet (200 mcg total) by mouth daily. In am on empty stomach   liothyronine 5 MCG tablet Commonly known as:  CYTOMEL Take 1 tablet (5 mcg total) by mouth every other day.   metFORMIN 1000 MG tablet Commonly known as:  GLUCOPHAGE Take 1 tablet (1,000 mg total) by mouth daily with breakfast.   miconazole 2 % cream Commonly known as:  MICATIN Apply 1 application topically 2 (two) times daily. To external penis   omeprazole 40 MG capsule Commonly known as:  PRILOSEC Take 1 capsule (40 mg total) by mouth daily.   ramipril 10 MG capsule Commonly known as:  ALTACE Take 2 capsules (20 mg total) by mouth daily.       Allergies:  Allergies  Allergen Reactions  . Penicillins Hives, Rash, Other (See Comments) and Swelling    Has patient had a PCN reaction causing immediate rash, facial/tongue/throat swelling, SOB or lightheadedness with hypotension: Yes Has patient had a PCN reaction causing severe rash involving mucus membranes or skin necrosis: No Has patient had a PCN reaction that required hospitalization No Has patient had a PCN reaction  occurring within the last 10 years: No If all of the above answers are "NO", then may proceed with Cephalosporin use.     Family History: Family History  Problem Relation Age of Onset  . Myelodysplastic syndrome Mother   . Arthritis Mother   . Emphysema Father   . Alcohol abuse Father   . COPD Father   . Depression  Father   . Early death Maternal Grandfather   . Heart disease Maternal Grandfather   . Stroke Maternal Grandfather     Social History:  reports that he has never smoked. He has never used smokeless tobacco. He reports current alcohol use. He reports that he does not use drugs.  ROS: UROLOGY Frequent Urination?: Yes Hard to postpone urination?: No Burning/pain with urination?: No Get up at night to urinate?: Yes Leakage of urine?: No Urine stream starts and stops?: Yes Trouble starting stream?: No Do you have to strain to urinate?: No Blood in urine?: No Urinary tract infection?: No Sexually transmitted disease?: No Injury to kidneys or bladder?: No Painful intercourse?: No Weak stream?: No Erection problems?: No Penile pain?: No  Gastrointestinal Nausea?: No Vomiting?: No Indigestion/heartburn?: No Diarrhea?: No Constipation?: No  Constitutional Fever: No Night sweats?: No Weight loss?: No Fatigue?: No  Skin Skin rash/lesions?: No Itching?: No  Eyes Blurred vision?: No Double vision?: No  Ears/Nose/Throat Sore throat?: No Sinus problems?: No  Hematologic/Lymphatic Swollen glands?: No Easy bruising?: No  Cardiovascular Leg swelling?: No Chest pain?: No  Respiratory Cough?: No Shortness of breath?: No  Endocrine Excessive thirst?: No  Musculoskeletal Back pain?: Yes Joint pain?: Yes  Neurological Headaches?: No Dizziness?: No  Psychologic Depression?: No Anxiety?: No  Physical Exam: BP (!) 161/82 (BP Location: Left Arm, Patient Position: Sitting, Cuff Size: Large)   Pulse 77   Ht 5\' 10"  (1.778 m)   Wt 260 lb 1.6  oz (118 kg)   BMI 37.32 kg/m   Constitutional:  Alert and oriented, No acute distress. Respiratory: Normal respiratory effort, no increased work of breathing. GU: No CVA tenderness Rectal. Prostate is 40 grams, smooth, no nodules. Skin: No rashes, bruises or suspicious lesions. Neurologic: Grossly intact, no focal deficits, moving all 4 extremities. Psychiatric: Normal mood and affect.  Laboratory Data: Lab Results  Component Value Date   WBC 7.9 01/11/2018   HGB 16.2 01/11/2018   HCT 49.0 01/11/2018   MCV 89 01/11/2018   PLT 199 01/11/2018   Lab Results  Component Value Date   CREATININE 1.15 01/05/2018   Assessment & Plan:    1. BPH with LUTS  - Last PSA (12/2017) was 1.2 ng/mL.   - Discussed medical management of his lower urinary tract symptoms.   - Symptoms currently not bothersome enough that he desires to take medication   Return in about 1 year (around 04/29/2019).  Abbie Sons, MD  [facility] [place] [address], [place], [place] [address] [phone]   I, Stephania Fragmin , am acting as a scribe for [employer], MD  I, Abbie Sons, MD, have reviewed all documentation for this visit. The documentation on 04/28/18 for the exam, diagnosis, procedures, and orders are all accurate and complete.

## 2018-05-01 DIAGNOSIS — Z85810 Personal history of malignant neoplasm of tongue: Secondary | ICD-10-CM | POA: Diagnosis not present

## 2018-05-01 DIAGNOSIS — J069 Acute upper respiratory infection, unspecified: Secondary | ICD-10-CM | POA: Diagnosis not present

## 2018-05-02 DIAGNOSIS — M5416 Radiculopathy, lumbar region: Secondary | ICD-10-CM | POA: Diagnosis not present

## 2018-05-02 DIAGNOSIS — M5136 Other intervertebral disc degeneration, lumbar region: Secondary | ICD-10-CM | POA: Diagnosis not present

## 2018-05-02 DIAGNOSIS — M48062 Spinal stenosis, lumbar region with neurogenic claudication: Secondary | ICD-10-CM | POA: Diagnosis not present

## 2018-05-17 ENCOUNTER — Ambulatory Visit (INDEPENDENT_AMBULATORY_CARE_PROVIDER_SITE_OTHER): Payer: Medicare Other

## 2018-05-17 DIAGNOSIS — Z9989 Dependence on other enabling machines and devices: Secondary | ICD-10-CM | POA: Diagnosis not present

## 2018-05-17 DIAGNOSIS — G4733 Obstructive sleep apnea (adult) (pediatric): Secondary | ICD-10-CM

## 2018-05-17 NOTE — Progress Notes (Signed)
95 percentile pressure 10   95th percentile leak 9.2   apnea index 0.4 /hr  apnea-hypopnea index  1.5 /hr   total days used  >4 hr 90 days  total days used <4 hr 0 days  Total compliance 100 percent  Tony Lawrence has no problems or questions. Download very good.

## 2018-05-30 ENCOUNTER — Other Ambulatory Visit (HOSPITAL_COMMUNITY): Payer: Self-pay | Admitting: Physical Medicine and Rehabilitation

## 2018-05-30 ENCOUNTER — Other Ambulatory Visit: Payer: Self-pay | Admitting: Physical Medicine and Rehabilitation

## 2018-05-30 DIAGNOSIS — M5136 Other intervertebral disc degeneration, lumbar region: Secondary | ICD-10-CM | POA: Diagnosis not present

## 2018-05-30 DIAGNOSIS — M48062 Spinal stenosis, lumbar region with neurogenic claudication: Secondary | ICD-10-CM | POA: Diagnosis not present

## 2018-05-30 DIAGNOSIS — M5416 Radiculopathy, lumbar region: Secondary | ICD-10-CM

## 2018-05-30 DIAGNOSIS — M5126 Other intervertebral disc displacement, lumbar region: Secondary | ICD-10-CM | POA: Diagnosis not present

## 2018-05-30 DIAGNOSIS — G8929 Other chronic pain: Secondary | ICD-10-CM | POA: Diagnosis not present

## 2018-05-30 DIAGNOSIS — M5442 Lumbago with sciatica, left side: Secondary | ICD-10-CM | POA: Diagnosis not present

## 2018-06-02 ENCOUNTER — Ambulatory Visit
Admission: RE | Admit: 2018-06-02 | Discharge: 2018-06-02 | Disposition: A | Discharge status: Home / Self Care | Payer: Medicare Other | Source: Ambulatory Visit | Attending: Physical Medicine and Rehabilitation | Admitting: Physical Medicine and Rehabilitation

## 2018-06-02 DIAGNOSIS — M5416 Radiculopathy, lumbar region: Secondary | ICD-10-CM | POA: Diagnosis not present

## 2018-06-02 DIAGNOSIS — M545 Low back pain: Secondary | ICD-10-CM | POA: Diagnosis not present

## 2018-06-14 DIAGNOSIS — M5136 Other intervertebral disc degeneration, lumbar region: Secondary | ICD-10-CM | POA: Diagnosis not present

## 2018-06-14 DIAGNOSIS — M6281 Muscle weakness (generalized): Secondary | ICD-10-CM | POA: Diagnosis not present

## 2018-06-15 ENCOUNTER — Ambulatory Visit: Payer: Medicare Other | Admitting: Internal Medicine

## 2018-06-15 NOTE — Progress Notes (Signed)
Cardiology Office Note Date:  06/19/2018  Patient ID:  Tony Lawrence, Tony Lawrence [birth year], MRN [id number] PCP:  McLean-Scocuzza, Nino Glow, MD  Cardiologist:  Dr. Saunders Revel, MD    Chief Complaint: Follow up  History of Present Illness: Tony Lawrence is a 73 y.o. male with history of CAD with CTO of the mid LAD, tongue cancer, diabetes mellitus, hypertension, hyperlipidemia, hypothyroidism, degenerative disc disease, chronic low back pain, chronic knee pain, and obstructive sleep apnea on CPAP who presents for follow-up of his CAD.  Patient was previously followed by Dr. Yvone Neu though more recently has established with Dr. Saunders Revel.  Prior echo in 2017, for preoperative evaluation, showed an EF of 55 to 60%, mild LVH, no regional wall motion abnormalities, grade 1 diastolic dysfunction, trivial aortic regurgitation, mild mitral regurgitation, mildly dilated left atrium.  Nuclear stress test at that time, also part of preoperative evaluation, showed a large defect of severe severity present in the mid anterior, mid anteroseptal, apical anterior, apical septal, and apex location consistent with LAD ischemia.  LVEF 30 to 44%, this was a high risk study.  In this setting, the patient underwent LHC on 11/10/2015 that showed severe one-vessel CAD with chronically occluded mid LAD after the origin of a large diagonal branch.  There were some left to left collaterals and faint right to left collaterals.  Low normal LV systolic function with an EF of 50 to 55% with mild anterior wall hypokinesis.  High normal LVEDP.  Continued medical therapy was recommended.  The lesion was not felt to be optimal for PCI given the occlusion was chronic and appeared to be long and diffuse with moderate calcifications.  The patient was most recently seen by cardiology on 12/21/2017 and was feeling well outside of his chronic low back pain and knee pain.  His blood pressure was suboptimally controlled leading to titration of his  ramipril.  Labs: 12/2017 - CBC unremarkable, TSH normal, A1c 6.0, ALT normal, LDL 29, potassium 4.8, serum creatinine 1.15  Patient comes in doing very well from a cardiac perspective.  No chest pain, shortness of breath, palpitations, dizziness, presyncope, or syncope.  No lower extremity swelling, abdominal distention, orthopnea, PND, or early satiety.  He continues to struggle with bilateral knee pain and low back pain with his back pain being worse than knee pain as of late.  Because of this, he has recently been started on tramadol.  Given his knee and back pain, he is unable to exercise on a regular basis.  He reports not closely monitoring foods for sodium content.  He does not check his blood pressure at home.  No falls since he was last seen.  No BRBPR or melena.  He does not have any concerns today.  Past Medical History:  Diagnosis Date  . Allergy   . Arthritis    low back pain s/p shots and blocks, knee pain  . BCC (basal cell carcinoma of skin)    forehead and chest Dr. Evorn Gong q 6 months   . CAD (coronary artery disease)   . Complication of anesthesia    SEVERE SORE THROAT AFTER BIOPSY 2010  . Coronary artery disease    Chronic total occlusion of mid LAD  . Diabetes mellitus without complication ([facility])   . GERD (gastroesophageal reflux disease)   . History of chicken pox   . History of shingles   . Hyperlipidemia   . Hypertension   . Hypothyroidism   . OSA on CPAP   .  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 Tami Ribas, [facility] H/o     Past Surgical History:  Procedure Laterality Date  . 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 CARDIAC CATHETERIZATION N/A 11/10/2015   Procedure: Left Heart Cath and Coronary Angiography;  Surgeon: Wellington Hampshire, MD;  Location: [facility] LAB;  Service: Cardiovascular;  Laterality: N/A;  . COLONOSCOPY  05/2006  . COLONOSCOPY WITH PROPOFOL N/A 05/24/2017   Procedure: COLONOSCOPY WITH  PROPOFOL;  Surgeon: Lollie Sails, MD;  Location: [facility] ENDOSCOPY;  Service: Endoscopy;  Laterality: N/A;  . EXCISION OF TONGUE LESION N/A 11/11/2015   Procedure: EXCISION OF TONGUE LESION/ WITH FROZEN SECTION;  Surgeon: Beverly Gust, MD;  Location: [facility] ORS;  Service: ENT;  Laterality: N/A;    Current Meds  Medication Sig  . aspirin 81 MG tablet Take 81 mg by mouth daily.  Marland Kitchen atorvastatin (LIPITOR) 20 MG tablet Take 1 tablet (20 mg total) by mouth daily. At night  . bisoprolol-hydrochlorothiazide (ZIAC) 2.5-6.25 MG tablet Take 1 tablet by mouth daily.  . clopidogrel (PLAVIX) 75 MG tablet TAKE ONE TABLET BY MOUTH DAILY  . glimepiride (AMARYL) 2 MG tablet TAKE ONE-HALF TABLET BY MOUTH EVERY MORNING AND TAKE ONE-HALF TABLET BY MOUTH WITH SUPPER  . isosorbide mononitrate (IMDUR) 30 MG 24 hr tablet Take 1 tablet (30 mg total) by mouth 2 (two) times daily.  Marland Kitchen JARDIANCE 25 MG TABS tablet TAKE ONE TABLET BY MOUTH DAILY  . levothyroxine (SYNTHROID, LEVOTHROID) 200 MCG tablet Take 1 tablet (200 mcg total) by mouth daily. In am on empty stomach  . liothyronine (CYTOMEL) 5 MCG tablet Take 1 tablet (5 mcg total) by mouth every other day.  . metFORMIN (GLUCOPHAGE) 1000 MG tablet Take 1 tablet (1,000 mg total) by mouth daily with breakfast.  . Multiple Vitamins-Minerals (CENTRUM SILVER 50+MEN PO) Take 1 tablet by mouth every morning.   Marland Kitchen omeprazole (PRILOSEC) 40 MG capsule Take 1 capsule (40 mg total) by mouth daily.  . ramipril (ALTACE) 10 MG capsule Take 2 capsules (20 mg total) by mouth daily.  . traMADol (ULTRAM) 50 MG tablet Take 50 mg by mouth daily.     Allergies:   Penicillins   Social History:  The patient  reports that he has never smoked. He has never used smokeless tobacco. He reports current alcohol use. He reports that he does not use drugs.   Family History:  The patient's family history includes Alcohol abuse in his father; Arthritis in his mother; COPD in his father; Depression in  his father; Early death in his maternal grandfather; Emphysema in his father; Heart disease in his maternal grandfather; Myelodysplastic syndrome in his mother; Stroke in his maternal grandfather.  ROS:   Review of Systems  Constitutional: Positive for malaise/fatigue. Negative for chills, diaphoresis, fever and weight loss.  HENT: Negative for congestion.   Eyes: Negative for discharge and redness.  Respiratory: Negative for cough, hemoptysis, sputum production, shortness of breath and wheezing.   Cardiovascular: Negative for chest pain, palpitations, orthopnea, claudication, leg swelling and PND.  Gastrointestinal: Negative for abdominal pain, blood in stool, heartburn, melena, nausea and vomiting.  Genitourinary: Negative for hematuria.  Musculoskeletal: Positive for back pain and joint pain. Negative for falls and myalgias.  Skin: Negative for rash.  Neurological: Negative for dizziness, tingling, tremors, sensory change, speech change, focal weakness, loss of consciousness and weakness.  Endo/Heme/Allergies: Does not  bruise/bleed easily.  Psychiatric/Behavioral: Negative for substance abuse. The patient is not nervous/anxious.      PHYSICAL EXAM:  VS:  BP (!) 150/82 (BP Location: Left Arm, Patient Position: Sitting, Cuff Size: Normal)   Pulse 61   Ht 5\' 10"  (1.778 m)   Wt 261 lb 12 oz (118.7 kg)   BMI 37.56 kg/m  BMI: Body mass index is 37.56 kg/m.  Physical Exam  Constitutional: He is oriented to person, place, and time. He appears well-developed and well-nourished.  HENT:  Head: Normocephalic and atraumatic.  Eyes: Right eye exhibits no discharge. Left eye exhibits no discharge.  Neck: Normal range of motion. No JVD present.  Cardiovascular: Normal rate, regular rhythm, S1 normal, S2 normal and normal heart sounds. Exam reveals no distant heart sounds, no friction rub, no midsystolic click and no opening snap.  No murmur heard. Pulses:      Posterior tibial pulses are 2+ on  the right side and 2+ on the left side.  Pulmonary/Chest: Effort normal and breath sounds normal. No respiratory distress. He has no decreased breath sounds. He has no wheezes. He has no rales. He exhibits no tenderness.  Abdominal: Soft. He exhibits no distension. There is no abdominal tenderness.  Musculoskeletal:        General: No edema.  Neurological: He is alert and oriented to person, place, and time.  Skin: Skin is warm and dry. No cyanosis. Nails show no clubbing.  Psychiatric: He has a normal mood and affect. His speech is normal and behavior is normal. Judgment and thought content normal.     EKG:  Was ordered and interpreted by me today. Shows NSR, 61 bpm, left axis deviation left anterior fascicular block, poor R wave progression along the precordial leads, low voltage QRS, no acute ST-T changes (grossly unchanged from prior)  Recent Labs: 01/05/2018: ALT 39; BUN 15; Creatinine, Ser 1.15; Potassium 4.8; Sodium 140 01/11/2018: Hemoglobin 16.2; Platelets 199; TSH 0.801  01/05/2018: Cholesterol 81; HDL 22; LDL Cholesterol 29; Total CHOL/HDL Ratio 3.7; Triglycerides 150; VLDL 30   CrCl cannot be calculated (Patient's most recent lab result is older than the maximum 21 days allowed.).   Wt Readings from Last 3 Encounters:  06/19/18 261 lb 12 oz (118.7 kg)  04/28/18 260 lb 1.6 oz (118 kg)  03/21/18 259 lb (117.5 kg)     Other studies reviewed: Additional studies/records reviewed today include: summarized above  ASSESSMENT AND PLAN:  1. CAD involving the native coronary arteries without angina: He is doing well without any symptoms concerning for angina.  Continue current medical therapy including dual antiplatelet therapy with aspirin and Plavix as well as antianginal therapy with bisoprolol and Imdur given CTO of the mid LAD as outlined above.  Aggressive risk factor modification and secondary prevention.  2. Hypertension: Blood pressure is suboptimally controlled today at  150/82.  His elevated BP readings are likely in the setting of knee and back pain, high sodium intake, and obesity.  I recommended he decrease sodium intake.  Increase Imdur to 90 mg daily in an effort to improve blood pressure.  He will otherwise continue Ziac and ramipril.  Heart rates in the mid to upper 50s and low 60s bpm preclude escalation of bisoprolol at this time.  3. Hyperlipidemia: Most recent LDL of 29 from 12/2017 with normal liver function at that time.  Remains on Lipitor 20 mg daily.  4. Obesity/OSA: Weight loss is advised however this is somewhat limited secondary to his knee  and back pain.  He reports compliance with CPAP.  Disposition: F/u with Dr. Saunders Revel or an APP in 6 months.  Current medicines are reviewed at length with the patient today.  The patient did not have any concerns regarding medicines.  Signed, Christell Faith, PA-C 06/19/2018 2:19 PM     [place] [address] [place], [place] [address] [phone]

## 2018-06-19 ENCOUNTER — Ambulatory Visit (INDEPENDENT_AMBULATORY_CARE_PROVIDER_SITE_OTHER): Payer: Medicare Other | Admitting: Physician Assistant

## 2018-06-19 ENCOUNTER — Encounter: Payer: Self-pay | Admitting: Physician Assistant

## 2018-06-19 VITALS — BP 150/82 | HR 61 | Ht 70.0 in | Wt 261.8 lb

## 2018-06-19 DIAGNOSIS — I10 Essential (primary) hypertension: Secondary | ICD-10-CM

## 2018-06-19 DIAGNOSIS — E785 Hyperlipidemia, unspecified: Secondary | ICD-10-CM

## 2018-06-19 DIAGNOSIS — I2510 Atherosclerotic heart disease of native coronary artery without angina pectoris: Secondary | ICD-10-CM | POA: Diagnosis not present

## 2018-06-19 DIAGNOSIS — G4733 Obstructive sleep apnea (adult) (pediatric): Secondary | ICD-10-CM | POA: Diagnosis not present

## 2018-06-19 DIAGNOSIS — E6609 Other obesity due to excess calories: Secondary | ICD-10-CM | POA: Diagnosis not present

## 2018-06-19 DIAGNOSIS — Z6837 Body mass index (BMI) 37.0-37.9, adult: Secondary | ICD-10-CM

## 2018-06-19 DIAGNOSIS — Z9989 Dependence on other enabling machines and devices: Secondary | ICD-10-CM

## 2018-06-19 MED ORDER — ISOSORBIDE MONONITRATE ER 30 MG PO TB24: ORAL_TABLET | ORAL | 3 refills | Status: DC

## 2018-06-19 NOTE — Patient Instructions (Signed)
Medication Instructions:  Your physician has recommended you make the following change in your medication:  1- Increase Imdur Take 1 tablet ( 30 mg) once in the am then,  take 2 tablets ( 60 mg total) once in the evening each day.  If you need a refill on your cardiac medications before your next appointment, please call your pharmacy.   Lab work: None ordered  If you have labs (blood work) drawn today and your tests are completely normal, you will receive your results only by: Marland Kitchen MyChart Message (if you have MyChart) OR . A paper copy in the mail If you have any lab test that is abnormal or we need to change your treatment, we will call you to review the results.  Testing/Procedures: None ordered   Follow-Up: At [facility], you and your health needs are our priority.  As part of our continuing mission to provide you with exceptional heart care, we have created designated Provider Care Teams.  These Care Teams include your primary Cardiologist (physician) and Advanced Practice Providers (APPs -  Physician Assistants and Nurse Practitioners) who all work together to provide you with the care you need, when you need it. You will need a follow up appointment in 6 months.  Please call our office 2 months in advance to schedule this appointment.  You may see Dr. Saunders Revel or Christell Faith, PA-C.

## 2018-06-20 DIAGNOSIS — M5136 Other intervertebral disc degeneration, lumbar region: Secondary | ICD-10-CM | POA: Diagnosis not present

## 2018-06-20 DIAGNOSIS — M6281 Muscle weakness (generalized): Secondary | ICD-10-CM | POA: Diagnosis not present

## 2018-06-22 DIAGNOSIS — M6281 Muscle weakness (generalized): Secondary | ICD-10-CM | POA: Diagnosis not present

## 2018-06-22 DIAGNOSIS — M5136 Other intervertebral disc degeneration, lumbar region: Secondary | ICD-10-CM | POA: Diagnosis not present

## 2018-06-27 DIAGNOSIS — M6281 Muscle weakness (generalized): Secondary | ICD-10-CM | POA: Diagnosis not present

## 2018-06-27 DIAGNOSIS — M5136 Other intervertebral disc degeneration, lumbar region: Secondary | ICD-10-CM | POA: Diagnosis not present

## 2018-06-29 DIAGNOSIS — M5136 Other intervertebral disc degeneration, lumbar region: Secondary | ICD-10-CM | POA: Diagnosis not present

## 2018-06-29 DIAGNOSIS — M6281 Muscle weakness (generalized): Secondary | ICD-10-CM | POA: Diagnosis not present

## 2018-07-04 DIAGNOSIS — M5136 Other intervertebral disc degeneration, lumbar region: Secondary | ICD-10-CM | POA: Diagnosis not present

## 2018-07-04 DIAGNOSIS — M6281 Muscle weakness (generalized): Secondary | ICD-10-CM | POA: Diagnosis not present

## 2018-07-07 DIAGNOSIS — M6281 Muscle weakness (generalized): Secondary | ICD-10-CM | POA: Diagnosis not present

## 2018-07-07 DIAGNOSIS — M5136 Other intervertebral disc degeneration, lumbar region: Secondary | ICD-10-CM | POA: Diagnosis not present

## 2018-07-10 DIAGNOSIS — M6281 Muscle weakness (generalized): Secondary | ICD-10-CM | POA: Diagnosis not present

## 2018-07-10 DIAGNOSIS — M5136 Other intervertebral disc degeneration, lumbar region: Secondary | ICD-10-CM | POA: Diagnosis not present

## 2018-07-30 ENCOUNTER — Other Ambulatory Visit: Payer: Self-pay | Admitting: Internal Medicine

## 2018-08-01 ENCOUNTER — Ambulatory Visit: Payer: Self-pay | Admitting: Adult Health

## 2018-08-02 ENCOUNTER — Other Ambulatory Visit: Payer: Self-pay | Admitting: Internal Medicine

## 2018-08-04 ENCOUNTER — Other Ambulatory Visit: Payer: Self-pay | Admitting: Internal Medicine

## 2018-08-04 NOTE — Telephone Encounter (Signed)
Requested medication (s) are due for refill today: yes  Requested medication (s) are on the active medication list: yes  Last refill:  01/26/18  Future visit scheduled: yes  Notes to clinic:  Called pt to verify provider- Per pt, he established care with Dr Terese Door and stated that she is willing to take over prescription refills   Requested Prescriptions  Pending Prescriptions Disp Refills   empagliflozin (JARDIANCE) 25 MG TABS tablet 90 tablet 1    Sig: Take 25 mg by mouth daily.     Endocrinology:  Diabetes - SGLT2 Inhibitors Failed - 08/04/2018  8:33 AM      Failed - HBA1C is between 0 and 7.9 and within 180 days    Hemoglobin A1C  Date Value Ref Range Status  01/11/2018 6.0 (A) 4.0 - 5.6 % Final         Passed - Cr in normal range and within 360 days    Creatinine, Ser  Date Value Ref Range Status  01/05/2018 1.15 0.61 - 1.24 mg/dL Final         Passed - LDL in normal range and within 360 days    LDL Cholesterol  Date Value Ref Range Status  01/05/2018 29 0 - 99 mg/dL Final    Comment:           Total Cholesterol/HDL:CHD Risk Coronary Heart Disease Risk Table                     Men   Women  1/2 Average Risk   3.4   3.3  Average Risk       5.0   4.4  2 X Average Risk   9.6   7.1  3 X Average Risk  23.4   11.0        Use the calculated Patient Ratio above and the CHD Risk Table to determine the patient's CHD Risk.        ATP III CLASSIFICATION (LDL):  <100     mg/dL   Optimal  100-129  mg/dL   Near or Above                    Optimal  130-159  mg/dL   Borderline  160-189  mg/dL   High  >190     mg/dL   Very High Performed at [facility], [address] [place], [place] [address]          Passed - eGFR in normal range and within 360 days    GFR calc Af Wyvonnia Lora  Date Value Ref Range Status  01/05/2018 >60 >60 mL/min Final    Comment:    (NOTE) The eGFR has been calculated using the CKD EPI equation. This calculation has not been  validated in all clinical situations. eGFR's persistently <60 mL/min signify possible Chronic Kidney Disease.    GFR calc non Af Amer  Date Value Ref Range Status  01/05/2018 >60 >60 mL/min Final         Passed - Valid encounter within last 6 months    Recent Outpatient Visits          4 months ago Testicle pain   [place], FNP   4 months ago Type 2 diabetes mellitus without complication, without long-term current use of insulin ([place])   [place] McLean-Scocuzza, Nino Glow, MD      Future Appointments  In 1 week McLean-Scocuzza, Nino Glow, MD [place], [place]   In 8 months [place], [facility], [facility]

## 2018-08-06 ENCOUNTER — Other Ambulatory Visit: Payer: Self-pay | Admitting: Internal Medicine

## 2018-08-07 MED ORDER — EMPAGLIFLOZIN 25 MG PO TABS: 25.0000 mg | ORAL_TABLET | Freq: Every day | ORAL | 1 refills | Status: DC

## 2018-08-08 ENCOUNTER — Other Ambulatory Visit: Payer: Self-pay | Admitting: Internal Medicine

## 2018-08-08 DIAGNOSIS — E119 Type 2 diabetes mellitus without complications: Secondary | ICD-10-CM

## 2018-08-08 MED ORDER — EMPAGLIFLOZIN 25 MG PO TABS: 25.0000 mg | ORAL_TABLET | Freq: Every day | ORAL | 3 refills | Status: DC

## 2018-08-14 ENCOUNTER — Telehealth: Payer: Self-pay

## 2018-08-14 NOTE — Telephone Encounter (Signed)
Copied from [place] [id number]. Topic: Appointment Scheduling - Scheduling Inquiry for Clinic >> Aug 11, 2018  4:51 PM [name] L wrote: Reason for CRM:   Pt states he is calling about his appointment next week.

## 2018-08-16 ENCOUNTER — Ambulatory Visit (INDEPENDENT_AMBULATORY_CARE_PROVIDER_SITE_OTHER): Payer: Medicare Other | Admitting: Internal Medicine

## 2018-08-16 ENCOUNTER — Encounter: Payer: Self-pay | Admitting: Adult Health

## 2018-08-16 DIAGNOSIS — Z1159 Encounter for screening for other viral diseases: Secondary | ICD-10-CM

## 2018-08-16 DIAGNOSIS — I10 Essential (primary) hypertension: Secondary | ICD-10-CM

## 2018-08-16 DIAGNOSIS — E039 Hypothyroidism, unspecified: Secondary | ICD-10-CM | POA: Diagnosis not present

## 2018-08-16 DIAGNOSIS — E785 Hyperlipidemia, unspecified: Secondary | ICD-10-CM | POA: Diagnosis not present

## 2018-08-16 DIAGNOSIS — E119 Type 2 diabetes mellitus without complications: Secondary | ICD-10-CM

## 2018-08-16 DIAGNOSIS — E559 Vitamin D deficiency, unspecified: Secondary | ICD-10-CM | POA: Diagnosis not present

## 2018-08-16 DIAGNOSIS — Z13818 Encounter for screening for other digestive system disorders: Secondary | ICD-10-CM | POA: Diagnosis not present

## 2018-08-16 DIAGNOSIS — I2510 Atherosclerotic heart disease of native coronary artery without angina pectoris: Secondary | ICD-10-CM | POA: Diagnosis not present

## 2018-08-16 MED ORDER — EMPAGLIFLOZIN 25 MG PO TABS: 25.0000 mg | ORAL_TABLET | Freq: Every day | ORAL | 3 refills | Status: DC

## 2018-08-16 NOTE — Progress Notes (Signed)
Virtual Visit via Video Note [place]  I connected with [place]   on 08/16/18 at 11:07 AM EDT by a video enabled telemedicine application and verified that I am speaking with the correct person using two identifiers.  Location patient: home Location provider:work  Persons participating in the virtual visit: patient, provider  I discussed the limitations of evaluation and management by telemedicine and the availability of in person appointments. The patient expressed understanding and agreed to proceed.   HPI: DM 2 A1C will check upcoming Metformin 1000 mg qd is helping and having less diarrhea able to tolerate Jardiance 25 mg qd no yeast or UTI sx's  [facility] Dr. Michelene Heady appt upcoming not sure the date but scheduled and saw last year   HTN not checking his  BP on zianc 2.5-6.25 Imdur 30 ER, Ramipril 10 mg qd no h/a, sob, chest pain   H/o tongue cancer appt with Dr. Tami Ribas upcoming mid May 2020 not f/u with Dr. Ihor [facility] for now observing oral lesion in mouth with local ENT   Low back pain chronic was radiating to lower abdomen, flank and groin x months did 6 weeks of PT and sx's subsided and now he is doing exercises given to him by PT at home and back is better though he has chronic back pain -he f/u with Dr. Raelyn Mora sx's improved for now     ROS: See pertinent positives and negatives per HPI.  Past Medical History:  Diagnosis Date  . Allergy   . Arthritis    low back pain s/p shots and blocks, knee pain  . BCC (basal cell carcinoma of skin)    forehead and chest Dr. Evorn Gong q 6 months   . CAD (coronary artery disease)   . Complication of anesthesia    SEVERE SORE THROAT AFTER BIOPSY 2010  . Coronary artery disease    Chronic total occlusion of mid LAD  . Diabetes mellitus without complication ([place])   . GERD (gastroesophageal reflux disease)   . History of chicken pox   . History of shingles   . Hyperlipidemia   . Hypertension   . Hypothyroidism   . OSA on  CPAP   . 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 Tami Ribas, [facility] H/o     Past Surgical History:  Procedure Laterality Date  . 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 CARDIAC CATHETERIZATION N/A 11/10/2015   Procedure: Left Heart Cath and Coronary Angiography;  Surgeon: Wellington Hampshire, MD;  Location: [place] [facility];  Service: Cardiovascular;  Laterality: N/A;  . COLONOSCOPY  05/2006  . COLONOSCOPY WITH PROPOFOL N/A 05/24/2017   Procedure: COLONOSCOPY WITH PROPOFOL;  Surgeon: Lollie Sails, MD;  Location: [facility] ENDOSCOPY;  Service: Endoscopy;  Laterality: N/A;  . EXCISION OF TONGUE LESION N/A 11/11/2015   Procedure: EXCISION OF TONGUE LESION/ WITH FROZEN SECTION;  Surgeon: Beverly Gust, MD;  Location: [facility] ORS;  Service: ENT;  Laterality: N/A;    Family History  Problem Relation Age of Onset  . Myelodysplastic syndrome Mother   . Arthritis Mother   . Emphysema Father   . Alcohol abuse Father   . COPD Father   . Depression Father   . Early death Maternal Grandfather   . Heart disease Maternal Grandfather   . Stroke Maternal Grandfather     SOCIAL HX: married lives with wife    Current Outpatient  Medications:  .  aspirin 81 MG tablet, Take 81 mg by mouth daily., Disp: , Rfl:  .  atorvastatin (LIPITOR) 20 MG tablet, Take 1 tablet (20 mg total) by mouth daily. At night, Disp: 90 tablet, Rfl: 5 .  bisoprolol-hydrochlorothiazide (ZIAC) 2.5-6.25 MG tablet, Take 1 tablet by mouth daily., Disp: 90 tablet, Rfl: 3 .  clopidogrel (PLAVIX) 75 MG tablet, TAKE ONE TABLET BY MOUTH DAILY, Disp: 90 tablet, Rfl: 3 .  empagliflozin (JARDIANCE) 25 MG TABS tablet, Take 25 mg by mouth daily., Disp: 90 tablet, Rfl: 3 .  glimepiride (AMARYL) 2 MG tablet, TAKE ONE-HALF TABLET BY MOUTH EVERY MORNING AND TAKE ONE-HALF TABLET BY MOUTH WITH SUPPER, Disp: 90 tablet, Rfl: 3 .  isosorbide mononitrate (IMDUR) 30 MG 24 hr tablet, Take 1 tablet ( 30 mg)  once in the am then,  take 2 tablets ( 60 mg total) once in the evening each day., Disp: 180 tablet, Rfl: 3 .  JARDIANCE 25 MG TABS tablet, TAKE ONE TABLET BY MOUTH DAILY, Disp: 90 tablet, Rfl: 0 .  levothyroxine (SYNTHROID, LEVOTHROID) 200 MCG tablet, Take 1 tablet (200 mcg total) by mouth daily. In am on empty stomach, Disp: 90 tablet, Rfl: 3 .  liothyronine (CYTOMEL) 5 MCG tablet, Take 1 tablet (5 mcg total) by mouth every other day., Disp: 90 tablet, Rfl: 1 .  metFORMIN (GLUCOPHAGE) 1000 MG tablet, Take 1 tablet (1,000 mg total) by mouth daily with breakfast., Disp: 90 tablet, Rfl: 3 .  Multiple Vitamins-Minerals (CENTRUM SILVER 50+MEN PO), Take 1 tablet by mouth every morning. , Disp: , Rfl:  .  omeprazole (PRILOSEC) 40 MG capsule, Take 1 capsule (40 mg total) by mouth daily., Disp: 90 capsule, Rfl: 3 .  ramipril (ALTACE) 10 MG capsule, Take 2 capsules (20 mg total) by mouth daily., Disp: 180 capsule, Rfl: 3 .  traMADol (ULTRAM) 50 MG tablet, Take 50 mg by mouth daily. , Disp: , Rfl:   EXAM:  VITALS per patient if applicable:  GENERAL: alert, oriented, appears well and in no acute distress  HEENT: atraumatic, conjunttiva clear, no obvious abnormalities on inspection of external nose and ears  NECK: normal movements of the head and neck  LUNGS: on inspection no signs of respiratory distress, breathing rate appears normal, no obvious gross SOB, gasping or wheezing  CV: no obvious cyanosis  MS: moves all visible extremities without noticeable abnormality  PSYCH/NEURO: pleasant and cooperative, no obvious depression or anxiety, speech and thought processing grossly intact  ASSESSMENT AND PLAN:  Discussed the following assessment and plan:  Type 2 diabetes mellitus without complication, without long-term current use of insulin (HCC) - Plan: Lipid panel, Hemoglobin A1c, Microalbumin / creatinine urine ratio, empagliflozin (JARDIANCE) 25 MG TABS tablet -fasting labs tomorrow 08/17/2018  at 9:45 am  -cont meds for now   Essential hypertension - cont current meds for now   Hyperlipidemia, unspecified hyperlipidemia type - Plan: Lipid panel  Hypothyroidism, unspecified type - Plan: TSH -cont current meds for now consider refill cytomel once labs come back   Tongue cancer f/u with Dr. Tami Ribas upcoming 08/2018 and [facility] Dr. Ihor Austin prn  Vitamin D deficiency - Plan: Vitamin D (25 hydroxy)  Chronic back pain f/u Dr. Sharlet Salina   HM Flu shot 01/11/18  prevnar had 02/03/18  Tdap had 08/02/2018  pna 23 (per prior notes former PCP Dr. Clayborn Bigness had in 2010) and shingrix not had  zostervax per pt had in the past  -consider update pna 23 in  the future   Colonoscopy 05/24/2017 diverticulosis no etiology for diarrhea  -repeat in 10 years Dr. Gustavo Lah  PSA 1.2 01/11/18 normal f/u urology Dr. Bernardo Heater, PSA 0.9 05/10/14   Dr. Sharlet Salina  Derm Dr. Evorn Gong h/o [place] forehead and chest  Urology Dr. Bernardo Heater  ENT Dr. Bernestine Amass [facility] -will f/u prn  [place] eye Dr. Merla Riches  Dentist Dr. Eugenie Birks  Cardiology Dr. Saunders Revel  GI Dr. Gustavo Lah       I discussed the assessment and treatment plan with the patient. The patient was provided an opportunity to ask questions and all were answered. The patient agreed with the plan and demonstrated an understanding of the instructions.   The patient was advised to call back or seek an in-person evaluation if the symptoms worsen or if the condition fails to improve as anticipated.  Time spent 15 minutes  Delorise Jackson, MD

## 2018-08-17 ENCOUNTER — Other Ambulatory Visit: Payer: Self-pay

## 2018-08-17 ENCOUNTER — Other Ambulatory Visit (INDEPENDENT_AMBULATORY_CARE_PROVIDER_SITE_OTHER): Payer: Medicare Other

## 2018-08-17 ENCOUNTER — Other Ambulatory Visit: Payer: Self-pay | Admitting: Internal Medicine

## 2018-08-17 DIAGNOSIS — E039 Hypothyroidism, unspecified: Secondary | ICD-10-CM | POA: Diagnosis not present

## 2018-08-17 DIAGNOSIS — E559 Vitamin D deficiency, unspecified: Secondary | ICD-10-CM | POA: Diagnosis not present

## 2018-08-17 DIAGNOSIS — I10 Essential (primary) hypertension: Secondary | ICD-10-CM

## 2018-08-17 DIAGNOSIS — Z1159 Encounter for screening for other viral diseases: Secondary | ICD-10-CM | POA: Diagnosis not present

## 2018-08-17 DIAGNOSIS — E785 Hyperlipidemia, unspecified: Secondary | ICD-10-CM | POA: Diagnosis not present

## 2018-08-17 DIAGNOSIS — Z13818 Encounter for screening for other digestive system disorders: Secondary | ICD-10-CM | POA: Diagnosis not present

## 2018-08-17 DIAGNOSIS — E119 Type 2 diabetes mellitus without complications: Secondary | ICD-10-CM

## 2018-08-17 LAB — COMPREHENSIVE METABOLIC PANEL
ALT: 33 U/L (ref 0–53)
AST: 29 U/L (ref 0–37)
Albumin: 4.3 g/dL (ref 3.5–5.2)
Alkaline Phosphatase: 84 U/L (ref 39–117)
BUN: 16 mg/dL (ref 6–23)
CO2: 27 mEq/L (ref 19–32)
Calcium: 9.3 mg/dL (ref 8.4–10.5)
Chloride: 100 mEq/L (ref 96–112)
Creatinine, Ser: 1.09 mg/dL (ref 0.40–1.50)
GFR: 66.27 mL/min (ref >60.00)
Glucose, Bld: 138 mg/dL — ABNORMAL HIGH (ref 70–99)
Potassium: 4.7 mEq/L (ref 3.5–5.1)
Sodium: 137 mEq/L (ref 135–145)
Total Bilirubin: 1.1 mg/dL (ref 0.2–1.2)
Total Protein: 7.5 g/dL (ref 6.0–8.3)

## 2018-08-17 LAB — CBC WITH DIFFERENTIAL/PLATELET
Basophils Absolute: 0.1 10*3/uL (ref 0.0–0.1)
Basophils Relative: 0.8 % (ref 0.0–3.0)
Eosinophils Absolute: 0.1 10*3/uL (ref 0.0–0.7)
Eosinophils Relative: 1.4 % (ref 0.0–5.0)
HCT: 48.4 % (ref 39.0–52.0)
Hemoglobin: 16.4 g/dL (ref 13.0–17.0)
Lymphocytes Relative: 30.9 % (ref 12.0–46.0)
Lymphs Abs: 2.3 10*3/uL (ref 0.7–4.0)
MCHC: 33.9 g/dL (ref 30.0–36.0)
MCV: 91.8 fl (ref 78.0–100.0)
Monocytes Absolute: 0.7 10*3/uL (ref 0.1–1.0)
Monocytes Relative: 8.9 % (ref 3.0–12.0)
Neutro Abs: 4.3 10*3/uL (ref 1.4–7.7)
Neutrophils Relative %: 58 % (ref 43.0–77.0)
Platelets: 165 10*3/uL (ref 150.0–400.0)
RBC: 5.27 Mil/uL (ref 4.22–5.81)
RDW: 14.9 % (ref 11.5–15.5)
WBC: 7.4 10*3/uL (ref 4.0–10.5)

## 2018-08-17 LAB — HEMOGLOBIN A1C: Hgb A1c MFr Bld: 7.3 % — ABNORMAL HIGH (ref 4.6–6.5)

## 2018-08-17 LAB — LIPID PANEL
Cholesterol: 87 mg/dL (ref 0–200)
HDL: 25.2 mg/dL — ABNORMAL LOW (ref >39.00)
LDL Cholesterol: 26 mg/dL (ref 0–99)
NonHDL: 61.52
Total CHOL/HDL Ratio: 3
Triglycerides: 180 mg/dL — ABNORMAL HIGH (ref 0.0–149.0)
VLDL: 36 mg/dL (ref 0.0–40.0)

## 2018-08-17 LAB — MICROALBUMIN / CREATININE URINE RATIO
Creatinine,U: 56.4 mg/dL
Microalb Creat Ratio: 1.4 mg/g (ref 0.0–30.0)
Microalb, Ur: 0.8 mg/dL (ref 0.0–1.9)

## 2018-08-17 LAB — TSH: TSH: 0.36 u[IU]/mL (ref 0.35–4.50)

## 2018-08-17 LAB — VITAMIN D 25 HYDROXY (VIT D DEFICIENCY, FRACTURES): VITD: 46.73 ng/mL (ref 30.00–100.00)

## 2018-08-17 MED ORDER — LIOTHYRONINE SODIUM 5 MCG PO TABS: 2.5000 ug | ORAL_TABLET | ORAL | 3 refills | Status: DC

## 2018-08-18 LAB — HEPATITIS C ANTIBODY
Hepatitis C Ab: NONREACTIVE
SIGNAL TO CUT-OFF: 0.01 (ref <1.00)

## 2018-08-21 DIAGNOSIS — Z85810 Personal history of malignant neoplasm of tongue: Secondary | ICD-10-CM | POA: Diagnosis not present

## 2018-11-07 DIAGNOSIS — M170 Bilateral primary osteoarthritis of knee: Secondary | ICD-10-CM | POA: Diagnosis not present

## 2018-11-07 DIAGNOSIS — M25561 Pain in right knee: Secondary | ICD-10-CM | POA: Diagnosis not present

## 2018-11-07 DIAGNOSIS — G8929 Other chronic pain: Secondary | ICD-10-CM | POA: Diagnosis not present

## 2018-11-07 DIAGNOSIS — M25562 Pain in left knee: Secondary | ICD-10-CM | POA: Diagnosis not present

## 2018-11-08 DIAGNOSIS — M1712 Unilateral primary osteoarthritis, left knee: Secondary | ICD-10-CM | POA: Diagnosis not present

## 2018-11-22 DIAGNOSIS — I2510 Atherosclerotic heart disease of native coronary artery without angina pectoris: Secondary | ICD-10-CM | POA: Diagnosis not present

## 2018-11-22 DIAGNOSIS — M1712 Unilateral primary osteoarthritis, left knee: Secondary | ICD-10-CM

## 2018-11-22 DIAGNOSIS — M5136 Other intervertebral disc degeneration, lumbar region: Secondary | ICD-10-CM | POA: Diagnosis not present

## 2018-11-22 DIAGNOSIS — K219 Gastro-esophageal reflux disease without esophagitis: Secondary | ICD-10-CM | POA: Diagnosis not present

## 2018-11-22 HISTORY — DX: Unilateral primary osteoarthritis, left knee: M17.12

## 2018-11-23 DIAGNOSIS — D3702 Neoplasm of uncertain behavior of tongue: Secondary | ICD-10-CM | POA: Diagnosis not present

## 2018-12-20 ENCOUNTER — Ambulatory Visit (INDEPENDENT_AMBULATORY_CARE_PROVIDER_SITE_OTHER): Payer: Medicare Other

## 2018-12-20 DIAGNOSIS — G4733 Obstructive sleep apnea (adult) (pediatric): Secondary | ICD-10-CM | POA: Diagnosis not present

## 2018-12-20 NOTE — Progress Notes (Signed)
95 percentile pressure 10   95th percentile leak 12.7   apnea index 0.4 /hr  apnea-hypopnea index  1.9 /hr   total days used  >4 hr 90 days  total days used <4 hr 0 days  Total compliance 100 percent  [name] is doing great no problems or questions

## 2018-12-22 ENCOUNTER — Encounter: Payer: Self-pay | Admitting: Internal Medicine

## 2018-12-22 ENCOUNTER — Ambulatory Visit (INDEPENDENT_AMBULATORY_CARE_PROVIDER_SITE_OTHER): Payer: Medicare Other | Admitting: Internal Medicine

## 2018-12-22 ENCOUNTER — Other Ambulatory Visit: Payer: Self-pay

## 2018-12-22 VITALS — BP 132/70 | HR 55 | Ht 70.0 in | Wt 257.5 lb

## 2018-12-22 DIAGNOSIS — I25118 Atherosclerotic heart disease of native coronary artery with other forms of angina pectoris: Secondary | ICD-10-CM

## 2018-12-22 DIAGNOSIS — I10 Essential (primary) hypertension: Secondary | ICD-10-CM | POA: Diagnosis not present

## 2018-12-22 DIAGNOSIS — E785 Hyperlipidemia, unspecified: Secondary | ICD-10-CM

## 2018-12-22 NOTE — Patient Instructions (Addendum)
Medication Instructions:  Your physician recommends that you continue on your current medications as directed. Please refer to the Current Medication list given to you today.  If you need a refill on your cardiac medications before your next appointment, please call your pharmacy.   Lab work: - None ordered.  If you have labs (blood work) drawn today and your tests are completely normal, you will receive your results only by: Marland Kitchen MyChart Message (if you have MyChart) OR . A paper copy in the mail If you have any lab test that is abnormal or we need to change your treatment, we will call you to review the results.  Testing/Procedures: - None ordered.   Follow-Up: At [facility], you and your health needs are our priority.  As part of our continuing mission to provide you with exceptional heart care, we have created designated Provider Care Teams.  These Care Teams include your primary Cardiologist (physician) and Advanced Practice Providers (APPs -  Physician Assistants and Nurse Practitioners) who all work together to provide you with the care you need, when you need it. You will need a follow up appointment in 6 months.  Please call our office 2 months in advance (in December for late February) to schedule this appointment.  You may see DR Harrell Gave END or one of the following Advanced Practice Providers on your designated Care Team:   Murray Hodgkins, NP Christell Faith, PA-C . Marrianne Mood, PA-C

## 2018-12-22 NOTE — Progress Notes (Signed)
Follow-up Outpatient Visit Date: 12/22/2018  Primary Care Provider: McLean-Scocuzza, Nino Glow, MD [place] [id number]  Chief Complaint: Follow-up coronary artery disease  HPI:  Tony Lawrence is a 73 y.o. year-old male with history of coronary artery disease with chronic total occlusion of the mid LAD, hypertension, hyperlipidemia,diabetes mellitus, obstructive sleep apnea, and hypothyroidism, who presents for follow-up of CAD.  He was last seen in our office by Tony Faith, PA, in February.  He was doing well at that time other than chronic knee and low back pain.  Given that blood pressure was suboptimally controlled, isosorbide mononitrate was increased to 90 mg daily (30 mg every morning, 60 mg every afternoon).  Today, Tony Lawrence reports feeling well other than arthritic pain.  He is planning to undergo left total knee replacement by Tony Lawrence in January.  From a heart standpoint, he has been doing well, denying chest pain, shortness of breath, palpitations, lightheadedness, and edema.  He is able to do some activity without cardiac symptoms, though his mobility is limited by knee pain.  Going up stairs is painful, though he is able to do it if needed.  He is tolerating his medications well.  He has not had any significant bleeding, remaining on aspirin and clopidogrel.  He denies headache with recent escalation of isosorbide mononitrate.  --------------------------------------------------------------------------------------------------  Past Medical History:  Diagnosis Date  . Allergy   . Arthritis    low back pain s/p shots and blocks, knee pain  . BCC (basal cell carcinoma of skin)    forehead and chest Dr. Evorn Gong q 6 months   . CAD (coronary artery disease)   . Complication of anesthesia    SEVERE SORE THROAT AFTER BIOPSY 2010  . Coronary artery disease    Chronic total occlusion of mid LAD  . Diabetes mellitus without complication ([place])   . GERD  (gastroesophageal reflux disease)   . History of chicken pox   . History of shingles   . Hyperlipidemia   . Hypertension   . Hypothyroidism   . OSA on CPAP   . 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 Tami Ribas, [facility] H/o    Past Surgical History:  Procedure Laterality Date  . 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 CARDIAC CATHETERIZATION N/A 11/10/2015   Procedure: Left Heart Cath and Coronary Angiography;  Surgeon: Wellington Hampshire, MD;  Location: [place] [facility] LAB;  Service: Cardiovascular;  Laterality: N/A;  . COLONOSCOPY  05/2006  . COLONOSCOPY WITH PROPOFOL N/A 05/24/2017   Procedure: COLONOSCOPY WITH PROPOFOL;  Surgeon: Lollie Sails, MD;  Location: [facility] ENDOSCOPY;  Service: Endoscopy;  Laterality: N/A;  . EXCISION OF TONGUE LESION N/A 11/11/2015   Procedure: EXCISION OF TONGUE LESION/ WITH FROZEN SECTION;  Surgeon: Beverly Gust, MD;  Location: [facility] ORS;  Service: ENT;  Laterality: N/A;    Current Meds  Medication Sig  . aspirin 81 MG tablet Take 81 mg by mouth daily.  Marland Kitchen atorvastatin (LIPITOR) 20 MG tablet Take 1 tablet (20 mg total) by mouth daily. At night  . bisoprolol-hydrochlorothiazide (ZIAC) 2.5-6.25 MG tablet Take 1 tablet by mouth daily.  . clopidogrel (PLAVIX) 75 MG tablet TAKE ONE TABLET BY MOUTH DAILY  . empagliflozin (JARDIANCE) 25 MG TABS tablet Take 25 mg by mouth daily.  Marland Kitchen glimepiride (AMARYL) 2 MG tablet TAKE ONE-HALF TABLET BY MOUTH EVERY MORNING AND TAKE ONE-HALF  TABLET BY MOUTH WITH SUPPER  . isosorbide mononitrate (IMDUR) 30 MG 24 hr tablet Take 1 tablet ( 30 mg) once in the am then,  take 2 tablets ( 60 mg total) once in the evening each day.  . levothyroxine (SYNTHROID, LEVOTHROID) 200 MCG tablet Take 1 tablet (200 mcg total) by mouth daily. In am on empty stomach  . liothyronine (CYTOMEL) 5 MCG tablet Take 0.5 tablets (2.5 mcg total) by mouth every other day.  . metFORMIN (GLUCOPHAGE) 1000 MG tablet  Take 1 tablet (1,000 mg total) by mouth daily with breakfast.  . Multiple Vitamins-Minerals (CENTRUM SILVER 50+MEN PO) Take 1 tablet by mouth every morning.   Marland Kitchen omeprazole (PRILOSEC) 40 MG capsule Take 1 capsule (40 mg total) by mouth daily.  . ramipril (ALTACE) 10 MG capsule Take 2 capsules (20 mg total) by mouth daily.    Allergies: Penicillins  Social History   Tobacco Use  . Smoking status: Never Smoker  . Smokeless tobacco: Never Used  Substance Use Topics  . Alcohol use: Yes    Alcohol/week: 0.0 standard drinks    Comment: occassionally   . Drug use: No    Family History  Problem Relation Age of Onset  . Myelodysplastic syndrome Mother   . Arthritis Mother   . Emphysema Father   . Alcohol abuse Father   . COPD Father   . Depression Father   . Early death Maternal Grandfather   . Heart disease Maternal Grandfather   . Stroke Maternal Grandfather     Review of Systems: A 12-system review of systems was performed and was negative except as noted in the HPI.  --------------------------------------------------------------------------------------------------  Physical Exam: BP 132/70 (BP Location: Left Arm, Patient Position: Sitting, Cuff Size: Normal)   Pulse (!) 55   Ht 5\' 10"  (1.778 m)   Wt 257 lb 8 oz (116.8 kg)   BMI 36.95 kg/m   General: NAD. HEENT: No conjunctival pallor or scleral icterus.  Facemask in place Neck: Supple without lymphadenopathy, thyromegaly, JVD, or HJR. No carotid bruit. Lungs: Normal work of breathing. Clear to auscultation bilaterally without wheezes or crackles. Heart: Regular rate and rhythm without murmurs, rubs, or gallops. Non-displaced PMI. Abd: Bowel sounds present. Soft, NT/ND without hepatosplenomegaly Ext: No lower extremity edema. Radial, PT, and DP pulses are 2+ bilaterally. Skin: Warm and dry without rash.  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  No significant change from prior tracing on 06/19/2018.  Lab  Results  Component Value Date   WBC 7.4 08/17/2018   HGB 16.4 08/17/2018   HCT 48.4 08/17/2018   MCV 91.8 08/17/2018   PLT 165.0 08/17/2018    Lab Results  Component Value Date   NA 137 08/17/2018   K 4.7 08/17/2018   CL 100 08/17/2018   CO2 27 08/17/2018   BUN 16 08/17/2018   CREATININE 1.09 08/17/2018   GLUCOSE 138 (H) 08/17/2018   ALT 33 08/17/2018    Lab Results  Component Value Date   CHOL 87 08/17/2018   HDL 25.20 (L) 08/17/2018   LDLCALC 26 08/17/2018   TRIG 180.0 (H) 08/17/2018   CHOLHDL 3 08/17/2018    --------------------------------------------------------------------------------------------------  ASSESSMENT AND PLAN: Coronary artery disease with stable angina: Tony Lawrence does not have any significant chest pain or dyspnea with his current antianginal regimen of bisoprolol and isosorbide mononitrate.  We will not make any medication changes today and plan to continue indefinite dual antiplatelet therapy with aspirin and clopidogrel.  Of  note, DAPT can be suspended for elective procedures, though I would advocate for continuation of low-dose aspirin during the perioperative period, if acceptable from a bleeding standpoint.  Hypertension: Blood pressure improved today, though still slightly above goal (target less than 130/80).  I have encouraged Tony Lawrence to reduce his sodium intake.  We will not make any medication changes today.  Hyperlipidemia: LDL well controlled on last check.  Continue atorvastatin 20 mg nightly.  Preoperative cardiovascular risk assessment: Tony Lawrence is planning to undergo knee arthroplasty in 04/2019.  He does not have any unstable cardiac symptoms.  Assuming that he does not develop new chest pain or shortness of breath in the interim, I think it is reasonable for him to proceed with this procedure without additional cardiovascular testing.  Please see above for guidance regarding dual antiplatelet therapy.  Follow-up: Return to  clinic in 6 months.  Tony Bush, MD 12/22/2018 12:00 PM

## 2018-12-25 ENCOUNTER — Other Ambulatory Visit: Payer: Self-pay | Admitting: Physician Assistant

## 2018-12-26 ENCOUNTER — Other Ambulatory Visit: Payer: Self-pay | Admitting: Physician Assistant

## 2018-12-26 ENCOUNTER — Other Ambulatory Visit: Payer: Self-pay | Admitting: Internal Medicine

## 2018-12-27 DIAGNOSIS — D485 Neoplasm of uncertain behavior of skin: Secondary | ICD-10-CM | POA: Diagnosis not present

## 2018-12-27 DIAGNOSIS — L570 Actinic keratosis: Secondary | ICD-10-CM | POA: Diagnosis not present

## 2018-12-27 DIAGNOSIS — Z85828 Personal history of other malignant neoplasm of skin: Secondary | ICD-10-CM | POA: Diagnosis not present

## 2018-12-27 DIAGNOSIS — L821 Other seborrheic keratosis: Secondary | ICD-10-CM | POA: Diagnosis not present

## 2018-12-27 DIAGNOSIS — Z08 Encounter for follow-up examination after completed treatment for malignant neoplasm: Secondary | ICD-10-CM | POA: Diagnosis not present

## 2018-12-27 DIAGNOSIS — L820 Inflamed seborrheic keratosis: Secondary | ICD-10-CM | POA: Diagnosis not present

## 2018-12-27 DIAGNOSIS — X32XXXA Exposure to sunlight, initial encounter: Secondary | ICD-10-CM | POA: Diagnosis not present

## 2019-01-03 ENCOUNTER — Other Ambulatory Visit: Payer: Self-pay | Admitting: Adult Health

## 2019-01-03 DIAGNOSIS — K219 Gastro-esophageal reflux disease without esophagitis: Secondary | ICD-10-CM

## 2019-01-04 ENCOUNTER — Ambulatory Visit (INDEPENDENT_AMBULATORY_CARE_PROVIDER_SITE_OTHER): Payer: Medicare Other | Admitting: Internal Medicine

## 2019-01-04 ENCOUNTER — Other Ambulatory Visit: Payer: Self-pay

## 2019-01-04 ENCOUNTER — Encounter: Payer: Self-pay | Admitting: Internal Medicine

## 2019-01-04 VITALS — BP 128/62 | HR 55 | Resp 16 | Ht 70.0 in | Wt 258.8 lb

## 2019-01-04 DIAGNOSIS — K219 Gastro-esophageal reflux disease without esophagitis: Secondary | ICD-10-CM

## 2019-01-04 DIAGNOSIS — G4733 Obstructive sleep apnea (adult) (pediatric): Secondary | ICD-10-CM

## 2019-01-04 DIAGNOSIS — I2510 Atherosclerotic heart disease of native coronary artery without angina pectoris: Secondary | ICD-10-CM | POA: Diagnosis not present

## 2019-01-04 DIAGNOSIS — Z9989 Dependence on other enabling machines and devices: Secondary | ICD-10-CM | POA: Diagnosis not present

## 2019-01-04 DIAGNOSIS — I10 Essential (primary) hypertension: Secondary | ICD-10-CM

## 2019-01-04 NOTE — Progress Notes (Signed)
[facility] [place], [place] [address]  Pulmonary Sleep Medicine   Office Visit Note  Patient Name: Tony Lawrence DOB: 29-Nov-1945 MRN [id number]  Date of Service: 01/04/2019  Complaints/HPI: Pt is here for follow up on osa.  He has been wearing his cpap nightly.  His compliance shows 100% with an ahi of 1.9/hr.  He is cleaning his machine by hand.  He is also changing his filters, and tubing as directed.  He denies any issues.  No hemoptysis, sinus issues or headaches.   ROS  General: (-) fever, (-) chills, (-) night sweats, (-) weakness Skin: (-) rashes, (-) itching,. Eyes: (-) visual changes, (-) redness, (-) itching. Nose and Sinuses: (-) nasal stuffiness or itchiness, (-) postnasal drip, (-) nosebleeds, (-) sinus trouble. Mouth and Throat: (-) sore throat, (-) hoarseness. Neck: (-) swollen glands, (-) enlarged thyroid, (-) neck pain. Respiratory: - cough, (-) bloody sputum, - shortness of breath, - wheezing. Cardiovascular: - ankle swelling, (-) chest pain. Lymphatic: (-) lymph node enlargement. Neurologic: (-) numbness, (-) tingling. Psychiatric: (-) anxiety, (-) depression   Current Medication: Outpatient Encounter Medications as of 01/04/2019  Medication Sig  . aspirin 81 MG tablet Take 81 mg by mouth daily.  Marland Kitchen atorvastatin (LIPITOR) 20 MG tablet Take 1 tablet (20 mg total) by mouth daily. At night  . bisoprolol-hydrochlorothiazide (ZIAC) 2.5-6.25 MG tablet Take 1 tablet by mouth daily.  . clopidogrel (PLAVIX) 75 MG tablet TAKE ONE TABLET BY MOUTH DAILY  . empagliflozin (JARDIANCE) 25 MG TABS tablet Take 25 mg by mouth daily.  Marland Kitchen glimepiride (AMARYL) 2 MG tablet TAKE ONE-HALF TABLET BY MOUTH EVERY MORNING AND TAKE ONE-HALF TABLET BY MOUTH WITH SUPPER  . isosorbide mononitrate (IMDUR) 30 MG 24 hr tablet TAKE ONE TABLET BY MOUTH EVERY MORNING AND TAKE TWO TABLETS BY MOUTH EVERY EVENING  . levothyroxine (SYNTHROID, LEVOTHROID) 200 MCG tablet Take  1 tablet (200 mcg total) by mouth daily. In am on empty stomach  . liothyronine (CYTOMEL) 5 MCG tablet Take 0.5 tablets (2.5 mcg total) by mouth every other day.  . metFORMIN (GLUCOPHAGE) 1000 MG tablet Take 1 tablet (1,000 mg total) by mouth daily with breakfast.  . Multiple Vitamins-Minerals (CENTRUM SILVER 50+MEN PO) Take 1 tablet by mouth every morning.   Marland Kitchen omeprazole (PRILOSEC) 40 MG capsule Take 1 capsule (40 mg total) by mouth daily.  . ramipril (ALTACE) 10 MG capsule Take 2 capsules (20 mg total) by mouth daily.   No facility-administered encounter medications on file as of 01/04/2019.     Surgical History: Past Surgical History:  Procedure Laterality Date  . 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 CARDIAC CATHETERIZATION N/A 11/10/2015   Procedure: Left Heart Cath and Coronary Angiography;  Surgeon: Wellington Hampshire, MD;  Location: [facility];  Service: Cardiovascular;  Laterality: N/A;  . COLONOSCOPY  05/2006  . COLONOSCOPY WITH PROPOFOL N/A 05/24/2017   Procedure: COLONOSCOPY WITH PROPOFOL;  Surgeon: Lollie Sails, MD;  Location: [facility];  Service: Endoscopy;  Laterality: N/A;  . EXCISION OF TONGUE LESION N/A 11/11/2015   Procedure: EXCISION OF TONGUE LESION/ WITH FROZEN SECTION;  Surgeon: Beverly Gust, MD;  Location: [facility];  Service: ENT;  Laterality: N/A;    Medical History: Past Medical History:  Diagnosis Date  . Allergy   . Arthritis    low back pain s/p shots and blocks, knee pain  . BCC (basal cell carcinoma of skin)  forehead and chest Dr. Evorn Gong q 6 months   . CAD (coronary artery disease)   . Complication of anesthesia    SEVERE SORE THROAT AFTER BIOPSY 2010  . Coronary artery disease    Chronic total occlusion of mid LAD  . Diabetes mellitus without complication ([place])   . GERD (gastroesophageal reflux disease)   . History of chicken pox   . History of shingles   . Hyperlipidemia   . Hypertension   . Hypothyroidism   . OSA on  CPAP   . 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 Tami Ribas, [facility]     Family History: Family History  Problem Relation Age of Onset  . Myelodysplastic syndrome Mother   . Arthritis Mother   . Emphysema Father   . Alcohol abuse Father   . COPD Father   . Depression Father   . Early death Maternal Grandfather   . Heart disease Maternal Grandfather   . Stroke Maternal Grandfather     Social History: Social History   Socioeconomic History  . Marital status: Married    Spouse name: Not on file  . Number of children: Not on file  . Years of education: Not on file  . Highest education level: Not on file  Occupational History  . Not on file  Social Needs  . Financial resource strain: Not on file  . Food insecurity    Worry: Not on file    Inability: Not on file  . Transportation needs    Medical: Not on file    Non-medical: Not on file  Tobacco Use  . Smoking status: Never Smoker  . Smokeless tobacco: Never Used  Substance and Sexual Activity  . Alcohol use: Yes    Alcohol/week: 0.0 standard drinks    Comment: occassionally   . Drug use: No  . Sexual activity: Yes  Lifestyle  . Physical activity    Days per week: Not on file    Minutes per session: Not on file  . Stress: Not on file  Relationships  . Social Herbalist on phone: Not on file    Gets together: Not on file    Attends religious service: Not on file    Active member of club or organization: Not on file    Attends meetings of clubs or organizations: Not on file    Relationship status: Not on file  . Intimate partner violence    Fear of current or ex partner: Not on file    Emotionally abused: Not on file    Physically abused: Not on file    Forced sexual activity: Not on file  Other Topics Concern  . 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 rate 16, height 5\' 10"  (1.778 m), weight 258 lb 12.8 oz (117.4 kg), SpO2 96 %.  Examination: General Appearance: The patient is well-developed, well-nourished, and in no distress. Skin: Gross inspection of skin unremarkable. Head: normocephalic, no gross deformities. Eyes: no gross deformities noted. ENT: ears appear grossly normal no exudates. Neck: Supple. No thyromegaly. No LAD. Respiratory: clear bilateraly. Cardiovascular: Normal S1 and S2 without murmur or rub. Extremities: No cyanosis. pulses are equal. Neurologic:  Alert and oriented. No involuntary movements.  LABS: No results found for this or any previous visit (from the past 2160 hour(s)).  Radiology: Mr Lumbar Spine Wo Contrast  Result Date: 06/02/2018 CLINICAL DATA:  Chronic low back pain. 2-3 month history of left low back pain extending into the left groin. EXAM: MRI LUMBAR SPINE WITHOUT CONTRAST TECHNIQUE: Multiplanar, multisequence MR imaging of the lumbar spine was performed. No intravenous contrast was administered. COMPARISON:  MRI lumbar spine dated June 05, 2015. FINDINGS: Segmentation:  Standard. Alignment: Unchanged mild dextroscoliosis. Unchanged trace retrolisthesis at L2-L3 and L3-L4. Vertebrae: No fracture, evidence of discitis, or bone lesion. Improved asymmetric right-sided degenerative endplate changes at 075-GRM. Conus medullaris and cauda equina: Conus extends to the T12-L1 level. Conus and cauda equina appear normal. Paraspinal and other soft tissues: Unchanged right renal cyst. Otherwise negative. Disc levels: T12-L1: No significant disc bulge or herniation. No spinal canal or neuroforaminal stenosis. L1-L2: Largely unchanged right eccentric disc height loss and small central disc protrusion. No stenosis. L2-L3: Unchanged severe disc height loss with circumferential disc osteophyte complex with prominent posterior  endplate spurring. Unchanged mild bilateral facet arthropathy, worse on the left. Unchanged mild to moderate spinal canal stenosis and mild left greater than right lateral recess stenosis. Unchanged moderate left neuroforaminal stenosis. No right neuroforaminal stenosis. L3-L4: Unchanged moderate disc height loss with diffuse disc bulging and endplate spurring. Unchanged left greater than right facet arthropathy. Unchanged mild right lateral recess stenosis. Unchanged mild bilateral neuroforaminal stenosis. No spinal canal stenosis. L4-L5: Unchanged severe disc height loss and diffuse disc bulging with endplate spurring. Unchanged mild bilateral facet arthropathy. Unchanged mild to moderate left and mild right neuroforaminal stenosis. No spinal canal stenosis. L5-S1: Unchanged tiny shallow broad-based posterior disc protrusion. Unchanged severe right and moderate left facet arthropathy. No stenosis. IMPRESSION: 1. Unchanged multilevel lumbar spondylosis as described above. Unchanged mild-to-moderate spinal canal stenosis and moderate left neuroforaminal stenosis at L2-L3. Electronically Signed   By: Titus Dubin M.D.   On: 06/02/2018 09:03    No results found.  No results found.    Assessment and Plan: Patient Active Problem List   Diagnosis Date Noted  . Gastroesophageal reflux disease without esophagitis 03/21/2018  . History of skin cancer 03/21/2018  . History of tongue cancer 03/21/2018  . OSA on CPAP 03/21/2018  . Diarrhea 03/21/2018  . Hypothyroidism 03/21/2018  . Chronic low back pain 03/14/2018  . Coronary artery disease of native artery of native heart with stable angina pectoris ([place]) 12/22/2017  . Essential hypertension 12/22/2017  . Hyperlipidemia LDL goal <70 12/22/2017  . 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 Cancer of ventral surface of tongue ([place]) 11/04/2015  . Malignant neoplasm of tongue, tip and lateral border ([place]) 11/04/2015  . Primary osteoarthritis of both knees  05/13/2014  . Other intervertebral disc displacement, lumbar region 01/16/2014  . Neuritis or radiculitis due to rupture of lumbar intervertebral disc 01/16/2014  . Parapelvic renal cyst 12/18/2013  . Flank pain 11/30/2013  . Arthritis, degenerative 03/12/2013  . Diabetes mellitus ([place]) 03/12/2013  . Increased frequency of urination 03/12/2013  . Slowing of urinary stream 03/12/2013  . Urinary hesitancy 03/12/2013  . Benign prostatic hyperplasia with urinary obstruction 03/06/2012    1. OSA on CPAP Continue cpap as directed.  Having excellent results.   2. Essential hypertension Stable, continue present management.   3. Gastroesophageal reflux disease without esophagitis Stable,continue current medications.   4. Morbid obesity ([place]) Obesity Counseling: Risk Assessment: An assessment of behavioral risk  factors was made today and includes lack of exercise sedentary lifestyle, lack of portion control and poor dietary habits.  Risk Modification Advice: She was counseled on portion control guidelines. Restricting daily caloric intake to. . The detrimental long term effects of obesity on her health and ongoing poor compliance was also discussed with the patient.    General Counseling: I have discussed the findings of the evaluation and examination with [place].  I have also discussed any further diagnostic evaluation thatmay be needed or ordered today. Haydin verbalizes understanding of the findings of todays visit. We also reviewed his medications today and discussed drug interactions and side effects including but not limited excessive drowsiness and altered mental states. We also discussed that there is always a risk not just to him but also people around him. he has been encouraged to call the office with any questions or concerns that should arise related to todays visit.    Time spent: 15 This patient was seen by Orson Gear AGNP-C in Collaboration with Dr. Devona Konig as a part of  collaborative care agreement.   I have personally obtained a history, examined the patient, evaluated laboratory and imaging results, formulated the assessment and plan and placed orders.    Allyne Gee, MD [facility] Pulmonary and Critical Care Sleep medicine

## 2019-01-08 DIAGNOSIS — L570 Actinic keratosis: Secondary | ICD-10-CM | POA: Diagnosis not present

## 2019-01-16 ENCOUNTER — Ambulatory Visit: Payer: Self-pay | Admitting: Adult Health

## 2019-01-25 DIAGNOSIS — Z23 Encounter for immunization: Secondary | ICD-10-CM | POA: Diagnosis not present

## 2019-01-25 DIAGNOSIS — D3702 Neoplasm of uncertain behavior of tongue: Secondary | ICD-10-CM | POA: Diagnosis not present

## 2019-01-29 ENCOUNTER — Other Ambulatory Visit: Payer: Self-pay

## 2019-01-29 ENCOUNTER — Ambulatory Visit (INDEPENDENT_AMBULATORY_CARE_PROVIDER_SITE_OTHER): Payer: Medicare Other

## 2019-01-29 DIAGNOSIS — Z0000 Encounter for general adult medical examination without abnormal findings: Secondary | ICD-10-CM

## 2019-01-29 NOTE — Patient Instructions (Addendum)
  Mr. Cords , Thank you for taking time to come for your Medicare Wellness Visit. I appreciate your ongoing commitment to your health goals. Please review the following plan we discussed and let me know if I can assist you in the future.   These are the goals we discussed: Goals    . Follow up with Primary Care Provider       This is a list of the screening recommended for you and due dates:  Health Maintenance  Topic Date Due  . Complete foot exam   05/23/1955  . Pneumonia vaccines (2 of 2 - PPSV23) 02/25/2011  . Eye exam for diabetics  05/23/2018  . Hemoglobin A1C  02/16/2019  . Colon Cancer Screening  05/25/2027  . Tetanus Vaccine  08/01/2028  . Flu Shot  Completed  .  Hepatitis C: One time screening is recommended by Center for Disease Control  (CDC) for  adults born from [birth year] through 1965.   Completed

## 2019-01-29 NOTE — Progress Notes (Signed)
Subjective:   Tony Lawrence is a 73 y.o. male who presents for Medicare Annual/Subsequent preventive examination.  Review of Systems:  No ROS.  Medicare Wellness Virtual Visit.  Visual/audio telehealth visit, UTA vital signs.   See social history for additional risk factors.   Cardiac Risk Factors include: advanced age (>7men, >21 women);male gender;hypertension;diabetes mellitus     Objective:    Vitals: There were no vitals taken for this visit.  There is no height or weight on file to calculate BMI.  Advanced Directives 01/29/2019 05/24/2017 11/21/2015 11/07/2015 11/04/2015  Does Patient Have a Medical Advance Directive? Yes Yes Yes Yes Yes  Type of Paramedic of [place];Living will [place];Living will Living will;Healthcare Power of [place];Living will -  Does patient want to make changes to medical advance directive? No - Patient declined - - - -  Copy of [place] in Chart? Yes - validated most recent copy scanned in chart (See row information) Yes - No - copy requested -    Tobacco Social History   Tobacco Use  Smoking Status Never Smoker  Smokeless Tobacco Never Used     Counseling given: Not Answered   Clinical Intake:  Pre-visit preparation completed: Yes        Diabetes: Yes(Followed by pcp)  How often do you need to have someone help you when you read instructions, pamphlets, or other written materials from your doctor or pharmacy?: 1 - Never  Interpreter Needed?: No     Past Medical History:  Diagnosis Date  . Allergy   . Arthritis    low back pain s/p shots and blocks, knee pain  . BCC (basal cell carcinoma of skin)    forehead and chest Dr. Evorn Gong q 6 months   . CAD (coronary artery disease)   . Complication of anesthesia    SEVERE SORE THROAT AFTER BIOPSY 2010  . Coronary artery disease    Chronic total occlusion of mid LAD  . Diabetes mellitus  without complication ([place])   . GERD (gastroesophageal reflux disease)   . History of chicken pox   . History of shingles   . Hyperlipidemia   . Hypertension   . Hypothyroidism   . OSA on CPAP   . 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 Tami Ribas, [facility] H/o    Past Surgical History:  Procedure Laterality Date  . 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 CARDIAC CATHETERIZATION N/A 11/10/2015   Procedure: Left Heart Cath and Coronary Angiography;  Surgeon: Wellington Hampshire, MD;  Location: [place] [facility];  Service: Cardiovascular;  Laterality: N/A;  . COLONOSCOPY  05/2006  . COLONOSCOPY WITH PROPOFOL N/A 05/24/2017   Procedure: COLONOSCOPY WITH PROPOFOL;  Surgeon: Lollie Sails, MD;  Location: [facility] ENDOSCOPY;  Service: Endoscopy;  Laterality: N/A;  . EXCISION OF TONGUE LESION N/A 11/11/2015   Procedure: EXCISION OF TONGUE LESION/ WITH FROZEN SECTION;  Surgeon: Beverly Gust, MD;  Location: [facility] ORS;  Service: ENT;  Laterality: N/A;   Family History  Problem Relation Age of Onset  . Myelodysplastic syndrome Mother   . Arthritis Mother   . Emphysema Father   . Alcohol abuse Father   . COPD Father   . Depression Father   . Early death Maternal Grandfather   . Heart disease Maternal Grandfather   . Stroke Maternal Grandfather  Social History   Socioeconomic History  . Marital status: Married    Spouse name: Not on file  . Number of children: Not on file  . Years of education: Not on file  . Highest education level: Not on file  Occupational History  . Not on file  Social Needs  . Financial resource strain: Not hard at all  . Food insecurity    Worry: Never true    Inability: Never true  . Transportation needs    Medical: No    Non-medical: No  Tobacco Use  . Smoking status: Never Smoker  . Smokeless tobacco: Never Used  Substance and Sexual Activity  . Alcohol use: Yes    Alcohol/week: 0.0 standard drinks     Comment: occassionally   . Drug use: No  . Sexual activity: Yes  Lifestyle  . Physical activity    Days per week: 0 days    Minutes per session: Not on file  . Stress: Not at all  Relationships  . Social Herbalist on phone: Not on file    Gets together: Not on file    Attends religious service: Not on file    Active member of club or organization: Not on file    Attends meetings of clubs or organizations: Not on file    Relationship status: Not on file  Other Topics Concern  . 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 aspirin 81 MG tablet Take 81 mg by mouth daily.  Marland Kitchen atorvastatin (LIPITOR) 20 MG tablet Take 1 tablet (20 mg total) by mouth daily. At night  . bisoprolol-hydrochlorothiazide (ZIAC) 2.5-6.25 MG tablet Take 1 tablet by mouth daily.  . clopidogrel (PLAVIX) 75 MG tablet TAKE ONE TABLET BY MOUTH DAILY  . empagliflozin (JARDIANCE) 25 MG TABS tablet Take 25 mg by mouth daily.  Marland Kitchen glimepiride (AMARYL) 2 MG tablet TAKE ONE-HALF TABLET BY MOUTH EVERY MORNING AND TAKE ONE-HALF TABLET BY MOUTH WITH SUPPER  . isosorbide mononitrate (IMDUR) 30 MG 24 hr tablet TAKE ONE TABLET BY MOUTH EVERY MORNING AND TAKE TWO TABLETS BY MOUTH EVERY EVENING  . levothyroxine (SYNTHROID, LEVOTHROID) 200 MCG tablet Take 1 tablet (200 mcg total) by mouth daily. In am on empty stomach  . liothyronine (CYTOMEL) 5 MCG tablet Take 0.5 tablets (2.5 mcg total) by mouth every other day.  . metFORMIN (GLUCOPHAGE) 1000 MG tablet Take 1 tablet (1,000 mg total) by mouth daily with breakfast.  . Multiple Vitamins-Minerals (CENTRUM SILVER 50+MEN PO) Take 1 tablet by mouth every morning.   Marland Kitchen omeprazole (PRILOSEC) 40 MG capsule Take 1 capsule (40 mg total) by mouth daily.  . ramipril (ALTACE) 10 MG capsule  Take 2 capsules (20 mg total) by mouth daily.   No facility-administered encounter medications on file as of 01/29/2019.     Activities of Daily Living In your present state of health, do you have any difficulty performing the following activities: 01/29/2019  Hearing? N  Vision? N  Difficulty concentrating or making decisions? N  Walking or climbing stairs? N  Dressing or bathing? N  Doing errands, shopping? N  Preparing Food and eating ? N  Using the Toilet? N  In the past six months, have you accidently leaked urine?  N  Do you have problems with loss of bowel control? N  Managing your Medications? N  Managing your Finances? N  Housekeeping or managing your Housekeeping? N  Some recent data might be hidden    Patient Care Team: McLean-Scocuzza, Nino Glow, MD as PCP - General (Internal Medicine)   Assessment:   This is a routine wellness examination for [place].  I connected with patient 01/29/19 at 12:00 PM EDT by an audio enabled telemedicine application and verified that I am speaking with the correct person using two identifiers. Patient stated full name and DOB. Patient gave permission to continue with virtual visit. Patient's location was at home and Nurse's location was at [place] office.   Health Maintenance Due: -Shingrix vaccine series one completed. -PNA - discussed; to be completed with doctor in visit or local pharmacy.   -Eye Exam- he will follow up with date completed. -Foot Exam- Followed by pcp -Hgb A1c- 08/17/18 (7.3) Update all pending maintenance due as appropriate.   See completed HM at the end of note.   Eye: Visual acuity not assessed. Virtual visit. Followed by their ophthalmologist. Retinopathy- none reported  Dental: Visits every 6 months.    Hearing: Demonstrates normal hearing during visit.  Safety:  Patient feels safe at home- yes Patient does have smoke detectors at home- yes Patient does wear sunscreen or protective clothing when in direct  sunlight - yes Patient does wear seat belt when in a moving vehicle - yes Patient drives- yes Adequate lighting in walkways free from debris- yes Grab bars and handrails used as appropriate- yes Ambulates with no assistive device Cell phone on person when ambulating outside of the home- yes  Social: Alcohol intake - yes      Smoking history- never   Smokers in home? none Illicit drug use? none  Depression: PHQ 2 &9 complete. See screening below. Denies irritability, anhedonia, sadness/tearfullness.    Falls: See screening below.    Medication: Taking as directed and without issues.   Covid-19: Precautions and sickness symptoms discussed. Wears mask, social distancing, hand hygiene as appropriate.   Activities of Daily Living Patient denies needing assistance with: household chores, feeding themselves, getting from bed to chair, getting to the toilet, bathing/showering, dressing, managing money, or preparing meals.   Memory: Patient is alert. Patient denies difficulty focusing or concentrating. Correctly identified the president of the [place], season and recall. Patient likes to read for brain stimulation.  BMI- discussed the importance of a healthy diet, water intake and the benefits of aerobic exercise.  Educational material provided.  Physical activity- no routine.   Diet: monitors carb intake Water: good intake Caffeine: 1 cup of tea in the morning  Other Providers Patient Care Team: McLean-Scocuzza, Nino Glow, MD as PCP - General (Internal Medicine)  Exercise Activities and Dietary recommendations Current Exercise Habits: The patient does not participate in regular exercise at present, Intensity: Mild  Goals    . Follow up with Primary Care Provider       Fall Risk Fall Risk  01/29/2019 03/14/2018 01/11/2018 01/02/2018 07/05/2017  Falls in the past year? 0 0 No No No  Comment - - - - -    Timed Get Up and Go Performed: no, virtual visit  Depression Screen PHQ  2/9 Scores 01/29/2019 03/14/2018 01/11/2018 01/02/2018  PHQ - 2 Score 0 0 0 0    Cognitive Function MMSE - Mini Mental State Exam 01/11/2018  Orientation to time 5  Orientation to Place 5  Registration 3  Attention/ Calculation 5  Recall 3  Language- name 2 objects 2  Language- repeat 1  Language- follow 3 step command 3  Language- read & follow direction 1  Write a sentence 1  Copy design 1  Total score 30     6CIT Screen 01/29/2019  What Year? 0 points  What month? 0 points  What time? 0 points  Count back from 20 0 points  Months in reverse 0 points  Repeat phrase 0 points  Total Score 0    Immunization History  Administered Date(s) Administered  . Influenza Inj Mdck Quad Pf 01/11/2018  . Influenza-Unspecified 02/10/2016, 01/25/2019  . Tdap 08/02/2018  . Zoster Recombinat (Shingrix) 11/29/2018   Screening Tests Health Maintenance  Topic Date Due  . FOOT EXAM  05/23/1955  . PNA vac Low Risk Adult (2 of 2 - PPSV23) 02/25/2011  . OPHTHALMOLOGY EXAM  05/23/2018  . HEMOGLOBIN A1C  02/16/2019  . COLONOSCOPY  05/25/2027  . TETANUS/TDAP  08/01/2028  . INFLUENZA VACCINE  Completed  . Hepatitis C Screening  Completed       Plan:   Keep all routine maintenance appointments.   Follow up 02/28/19 @ 1030  Knee surgery scheduled 04/30/19.  Medicare Attestation I have personally reviewed: The patient's medical and social history Their use of alcohol, tobacco or illicit drugs Their current medications and supplements The patient's functional ability including ADLs,fall risks, home safety risks, cognitive, and hearing and visual impairment Diet and physical activities Evidence for depression   In addition, I have reviewed and discussed with patient certain preventive protocols, quality metrics, and best practice recommendations. A written personalized care plan for preventive services as well as general preventive health recommendations were provided to patient via mail.      Varney Biles, LPN  [id number]

## 2019-02-07 DIAGNOSIS — Z23 Encounter for immunization: Secondary | ICD-10-CM | POA: Diagnosis not present

## 2019-02-07 DIAGNOSIS — E119 Type 2 diabetes mellitus without complications: Secondary | ICD-10-CM | POA: Diagnosis not present

## 2019-02-07 LAB — HM DIABETES EYE EXAM

## 2019-02-09 ENCOUNTER — Encounter: Payer: Self-pay | Admitting: Internal Medicine

## 2019-02-27 ENCOUNTER — Other Ambulatory Visit: Payer: Self-pay

## 2019-02-28 ENCOUNTER — Encounter: Payer: Self-pay | Admitting: Internal Medicine

## 2019-02-28 ENCOUNTER — Ambulatory Visit (INDEPENDENT_AMBULATORY_CARE_PROVIDER_SITE_OTHER): Payer: Medicare Other | Admitting: Internal Medicine

## 2019-02-28 VITALS — BP 130/64 | HR 56 | Temp 97.8°F | Ht 70.0 in | Wt 255.6 lb

## 2019-02-28 DIAGNOSIS — K219 Gastro-esophageal reflux disease without esophagitis: Secondary | ICD-10-CM | POA: Diagnosis not present

## 2019-02-28 DIAGNOSIS — N400 Enlarged prostate without lower urinary tract symptoms: Secondary | ICD-10-CM | POA: Diagnosis not present

## 2019-02-28 DIAGNOSIS — E039 Hypothyroidism, unspecified: Secondary | ICD-10-CM | POA: Diagnosis not present

## 2019-02-28 DIAGNOSIS — I2510 Atherosclerotic heart disease of native coronary artery without angina pectoris: Secondary | ICD-10-CM

## 2019-02-28 DIAGNOSIS — Z1389 Encounter for screening for other disorder: Secondary | ICD-10-CM | POA: Diagnosis not present

## 2019-02-28 DIAGNOSIS — I10 Essential (primary) hypertension: Secondary | ICD-10-CM

## 2019-02-28 DIAGNOSIS — Z125 Encounter for screening for malignant neoplasm of prostate: Secondary | ICD-10-CM

## 2019-02-28 DIAGNOSIS — E119 Type 2 diabetes mellitus without complications: Secondary | ICD-10-CM

## 2019-02-28 MED ORDER — GLIMEPIRIDE 1 MG PO TABS: ORAL_TABLET | ORAL | 3 refills | Status: DC

## 2019-02-28 MED ORDER — RAMIPRIL 10 MG PO CAPS: 20.0000 mg | ORAL_CAPSULE | Freq: Every day | ORAL | 3 refills | Status: DC

## 2019-02-28 MED ORDER — ATORVASTATIN CALCIUM 20 MG PO TABS: 20.0000 mg | ORAL_TABLET | Freq: Every day | ORAL | 11 refills | Status: DC

## 2019-02-28 MED ORDER — BISOPROLOL-HYDROCHLOROTHIAZIDE 2.5-6.25 MG PO TABS: 1.0000 | ORAL_TABLET | Freq: Every day | ORAL | 3 refills | Status: DC

## 2019-02-28 MED ORDER — OMEPRAZOLE 40 MG PO CPDR: 40.0000 mg | DELAYED_RELEASE_CAPSULE | Freq: Every day | ORAL | 3 refills | Status: DC

## 2019-02-28 NOTE — Progress Notes (Signed)
Chief Complaint  Patient presents with  . Follow-up   F/u  1. HTN controlled on ramipril 20 mg qd ziac 2.5-6.25 mg qd  2. DM 2 last A1C 7.3 reduce metformin to 1000 mg qd and on jardiance 25 mg qd and also was on steroids at the time for MSK pain  3. BPH f/u Dr. Elenor Quinones 03/2019 will check PSA and CC him  4. Left knee replacement [facility] 04/2019    Review of Systems  Constitutional: Negative for weight loss.  HENT: Negative for hearing loss.   Eyes: Negative for blurred vision.  Respiratory: Negative for shortness of breath.   Cardiovascular: Negative for chest pain.  Gastrointestinal: Negative for abdominal pain.  Musculoskeletal: Positive for joint pain. Negative for falls.  Skin: Negative for rash.  Neurological: Negative for headaches.  Psychiatric/Behavioral: Negative for depression and memory loss.   Past Medical History:  Diagnosis Date  . Allergy   . Arthritis    low back pain s/p shots and blocks, knee pain  . BCC (basal cell carcinoma of skin)    forehead and chest Dr. Evorn Gong q 6 months   . CAD (coronary artery disease)   . Complication of anesthesia    SEVERE SORE THROAT AFTER BIOPSY 2010  . Coronary artery disease    Chronic total occlusion of mid LAD  . Diabetes mellitus without complication ([place])   . GERD (gastroesophageal reflux disease)   . History of chicken pox   . History of shingles   . Hyperlipidemia   . Hypertension   . Hypothyroidism   . OSA on CPAP   . 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 Tami Ribas, [facility] H/o    Past Surgical History:  Procedure Laterality Date  . 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 CARDIAC CATHETERIZATION N/A 11/10/2015   Procedure: Left Heart Cath and Coronary Angiography;  Surgeon: Wellington Hampshire, MD;  Location: [place] [facility];  Service: Cardiovascular;  Laterality: N/A;  . COLONOSCOPY  05/2006  . COLONOSCOPY WITH PROPOFOL N/A 05/24/2017   Procedure: COLONOSCOPY  WITH PROPOFOL;  Surgeon: Lollie Sails, MD;  Location: [facility] ENDOSCOPY;  Service: Endoscopy;  Laterality: N/A;  . EXCISION OF TONGUE LESION N/A 11/11/2015   Procedure: EXCISION OF TONGUE LESION/ WITH FROZEN SECTION;  Surgeon: Beverly Gust, MD;  Location: [facility] ORS;  Service: ENT;  Laterality: N/A;   Family History  Problem Relation Age of Onset  . Myelodysplastic syndrome Mother   . Arthritis Mother   . Emphysema Father   . Alcohol abuse Father   . COPD Father   . Depression Father   . Early death Maternal Grandfather   . Heart disease Maternal Grandfather   . Stroke Maternal Grandfather    Social History   Socioeconomic History  . Marital status: Married    Spouse name: Not on file  . Number of children: Not on file  . Years of education: Not on file  . Highest education level: Not on file  Occupational History  . Not on file  Social Needs  . Financial resource strain: Not hard at all  . Food insecurity    Worry: Never true    Inability: Never true  . Transportation needs    Medical: No    Non-medical: No  Tobacco Use  . Smoking status: Never Smoker  . Smokeless tobacco: Never Used  Substance and Sexual Activity  .  Alcohol use: Yes    Alcohol/week: 0.0 standard drinks    Comment: occassionally   . Drug use: No  . Sexual activity: Yes  Lifestyle  . Physical activity    Days per week: 0 days    Minutes per session: Not on file  . Stress: Not at all  Relationships  . Social Herbalist on phone: Not on file    Gets together: Not on file    Attends religious service: Not on file    Active member of club or organization: Not on file    Attends meetings of clubs or organizations: Not on file    Relationship status: Not on file  . Intimate partner violence    Fear of current or ex partner: No    Emotionally abused: No    Physically abused: No    Forced sexual activity: No  Other Topics Concern  . 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 aspirin 81 MG tablet Take 81 mg by mouth daily.  Marland Kitchen atorvastatin (LIPITOR) 20 MG tablet Take 1 tablet (20 mg total) by mouth daily at 6 PM. At night  . bisoprolol-hydrochlorothiazide (ZIAC) 2.5-6.25 MG tablet Take 1 tablet by mouth daily.  . clopidogrel (PLAVIX) 75 MG tablet TAKE ONE TABLET BY MOUTH DAILY  . empagliflozin (JARDIANCE) 25 MG TABS tablet Take 25 mg by mouth daily.  Marland Kitchen glimepiride (AMARYL) 1 MG tablet TAKE ONE TABLET BY MOUTH EVERY MORNING AND TAKE ONE TABLET BY MOUTH WITH SUPPER  . isosorbide mononitrate (IMDUR) 30 MG 24 hr tablet TAKE ONE TABLET BY MOUTH EVERY MORNING AND TAKE TWO TABLETS BY MOUTH EVERY EVENING  . levothyroxine (SYNTHROID, LEVOTHROID) 200 MCG tablet Take 1 tablet (200 mcg total) by mouth daily. In am on empty stomach  . liothyronine (CYTOMEL) 5 MCG tablet Take 0.5 tablets (2.5 mcg total) by mouth every other day.  . metFORMIN (GLUCOPHAGE) 1000 MG tablet Take 1 tablet (1,000 mg total) by mouth daily with breakfast.  . Multiple Vitamins-Minerals (CENTRUM SILVER 50+MEN PO) Take 1 tablet by mouth every morning.   Marland Kitchen omeprazole (PRILOSEC) 40 MG capsule Take 1 capsule (40 mg total) by mouth daily.  . ramipril (ALTACE) 10 MG capsule Take 2 capsules (20 mg total) by mouth daily.  . [DISCONTINUED] atorvastatin (LIPITOR) 20 MG tablet Take 1 tablet (20 mg total) by mouth daily. At night  . [DISCONTINUED] bisoprolol-hydrochlorothiazide (ZIAC) 2.5-6.25 MG tablet Take 1 tablet by mouth daily.  . [DISCONTINUED] glimepiride (AMARYL) 2 MG tablet TAKE ONE-HALF TABLET BY MOUTH EVERY MORNING AND TAKE ONE-HALF TABLET BY MOUTH WITH SUPPER  . [DISCONTINUED] omeprazole (PRILOSEC) 40 MG capsule Take 1 capsule (40 mg total) by mouth daily.  . [DISCONTINUED] ramipril (ALTACE) 10 MG capsule Take 2 capsules (20 mg total) by mouth daily.    Allergies  Allergen Reactions  . Penicillins Hives, Rash, Other (See Comments) and Swelling    Has patient had a PCN reaction causing immediate rash, facial/tongue/throat swelling, SOB or lightheadedness with hypotension: Yes Has patient had a PCN reaction causing severe rash involving mucus membranes or skin necrosis: No Has patient had a PCN reaction that required hospitalization No Has patient had a PCN reaction occurring within the last 10 years: No If all of the above  answers are "NO", then may proceed with Cephalosporin use.    Recent Results (from the past 2160 hour(s))  HM DIABETES EYE EXAM     Status: None   Collection Time: 02/07/19 12:00 AM  Result Value Ref Range   HM Diabetic Eye Exam No Retinopathy No Retinopathy    Comment: AE Dr. Neville Route 02/08/2019    Objective  Body mass index is 36.67 kg/m. Wt Readings from Last 3 Encounters:  02/28/19 255 lb 9.6 oz (115.9 kg)  01/04/19 258 lb 12.8 oz (117.4 kg)  12/22/18 257 lb 8 oz (116.8 kg)   Temp Readings from Last 3 Encounters:  02/28/19 97.8 F (36.6 C) (Skin)  03/21/18 98.1 F (36.7 C) (Oral)  03/14/18 98.2 F (36.8 C) (Oral)   BP Readings from Last 3 Encounters:  02/28/19 130/64  01/04/19 128/62  12/22/18 132/70   Pulse Readings from Last 3 Encounters:  02/28/19 (!) 56  01/04/19 (!) 55  12/22/18 (!) 55    Physical Exam Vitals signs and nursing note reviewed.  Constitutional:      Appearance: Normal appearance. He is well-developed and well-groomed. He is obese.     Comments: +mask on    HENT:     Head: Normocephalic and atraumatic.  Eyes:     Conjunctiva/sclera: Conjunctivae normal.     Pupils: Pupils are equal, round, and reactive to light.  Cardiovascular:     Rate and Rhythm: Normal rate and regular rhythm.     Heart sounds: Normal heart sounds. No murmur.  Pulmonary:     Effort: Pulmonary effort is normal.     Breath sounds: Normal breath sounds.  Skin:    General: Skin is warm and dry.      Comments: Aks to scalp   Neurological:     General: No focal deficit present.     Mental Status: He is alert and oriented to person, place, and time. Mental status is at baseline.     Gait: Gait normal.  Psychiatric:        Attention and Perception: Attention and perception normal.        Mood and Affect: Mood and affect normal.        Speech: Speech normal.        Behavior: Behavior normal. Behavior is cooperative.        Thought Content: Thought content normal.        Cognition and Memory: Cognition and memory normal.        Judgment: Judgment normal.     Assessment  Plan  Hypothyroidism, unspecified type - Plan: TSH, T4, free  Essential hypertension - Plan: ramipril (ALTACE) 10 MG capsule, bisoprolol-hydrochlorothiazide (ZIAC) 2.5-6.25 MG tablet, Comprehensive metabolic panel, CBC with Differential/Platelet, Urinalysis, Routine w reflex microscopic, Lipid panel  Gastroesophageal reflux disease without esophagitis - Plan: omeprazole (PRILOSEC) 40 MG capsule  Type 2 diabetes mellitus without complication, without long-term current use of insulin (HCC) - Plan: glimepiride (AMARYL) 1 MG tablet, atorvastatin (LIPITOR) 20 MG tablet, Hemoglobin A1c, Urinalysis, Routine w reflex microscopic, Lipid  Foot exam today normal Eye exam normal 02/07/19   Coronary artery disease involving native coronary artery of native heart without angina pectoris - Plan: atorvastatin (LIPITOR) 20 MG tablet  Benign prostatic hyperplasia, unspecified whether lower urinary tract symptoms present - Plan: PSA   HM Flu shot 01/2019 had low dose vaccine prevnar had 02/03/18  Tdap had 08/02/2018  pna 23(per prior notes former PCP Dr. Clayborn Bigness had in 2010)and shingrix not had  -  due at f/u visit with fasting labs after 02/28/2019  zostervax per pt had in the past  Had 2/2 shingrix  -consider update pna 23 in the future   Colonoscopy 05/24/2017 diverticulosis no etiology for diarrhea  -repeat in 10 years  Dr. Gustavo Lah  PSA 1.2 01/11/18 normal f/u urology Dr. Bernardo Heater, PSA 0.9 05/10/14 -appt 03/2019 Dr. Bernardo Heater   Dr. Sharlet Salina  Derm Dr. Evorn Gong h/o [place] forehead and chest saw 9 or 01/2019 ln2 to scalp still unresolved as of 02/28/2019 rec call back for more ln2 before 1 year  Urology Dr. Bernardo Heater  ENT Dr. Bernestine Amass [facility] -will f/u prn  [place] eye Dr. Merla Riches  Dentist Dr. Eugenie Birks  Cardiology Dr. Saunders Revel  GI Dr. Gustavo Lah     Provider: Dr. Olivia Mackie McLean-Scocuzza-Internal Medicine

## 2019-02-28 NOTE — Patient Instructions (Signed)
Consider pneumonia 23 vaccine in the future you can get on same day as labs   Pneumococcal Vaccine, Polyvalent solution for injection What is this medicine? PNEUMOCOCCAL VACCINE, POLYVALENT (NEU mo KOK al vak SEEN, pol ee VEY luhnt) is a vaccine to prevent pneumococcus bacteria infection. These bacteria are a major cause of ear infections, Strep throat infections, and serious pneumonia, meningitis, or blood infections worldwide. These vaccines help the body to produce antibodies (protective substances) that help your body defend against these bacteria. This vaccine is recommended for people 24 years of age and older with health problems. It is also recommended for all adults over 39 years old. This vaccine will not treat an infection. This medicine may be used for other purposes; ask your health care provider or pharmacist if you have questions. COMMON BRAND NAME(S): Pneumovax 23 What should I tell my health care provider before I take this medicine? They need to know if you have any of these conditions:  bleeding problems  bone marrow or organ transplant  cancer, Hodgkin's disease  fever  infection  immune system problems  low platelet count in the blood  seizures  an unusual or allergic reaction to pneumococcal vaccine, diphtheria toxoid, other vaccines, latex, other medicines, foods, dyes, or preservatives  pregnant or trying to get pregnant  breast-feeding How should I use this medicine? This vaccine is for injection into a muscle or under the skin. It is given by a health care professional. A copy of Vaccine Information Statements will be given before each vaccination. Read this sheet carefully each time. The sheet may change frequently. Talk to your pediatrician regarding the use of this medicine in children. While this drug may be prescribed for children as young as 4 years of age for selected conditions, precautions do apply. Overdosage: If you think you have taken too much  of this medicine contact a poison control center or emergency room at once. NOTE: This medicine is only for you. Do not share this medicine with others. What if I miss a dose? It is important not to miss your dose. Call your doctor or health care professional if you are unable to keep an appointment. What may interact with this medicine?  medicines for cancer chemotherapy  medicines that suppress your immune function  medicines that treat or prevent blood clots like warfarin, enoxaparin, and dalteparin  steroid medicines like prednisone or cortisone This list may not describe all possible interactions. Give your health care provider a list of all the medicines, herbs, non-prescription drugs, or dietary supplements you use. Also tell them if you smoke, drink alcohol, or use illegal drugs. Some items may interact with your medicine. What should I watch for while using this medicine? Mild fever and pain should go away in 3 days or less. Report any unusual symptoms to your doctor or health care professional. What side effects may I notice from receiving this medicine? Side effects that you should report to your doctor or health care professional as soon as possible:  allergic reactions like skin rash, itching or hives, swelling of the face, lips, or tongue  breathing problems  confused  fever over 102 degrees F  pain, tingling, numbness in the hands or feet  seizures  unusual bleeding or bruising  unusual muscle weakness Side effects that usually do not require medical attention (report to your doctor or health care professional if they continue or are bothersome):  aches and pains  diarrhea  fever of 102 degrees F or  less  headache  irritable  loss of appetite  pain, tender at site where injected  trouble sleeping This list may not describe all possible side effects. Call your doctor for medical advice about side effects. You may report side effects to FDA at  1-800-FDA-1088. Where should I keep my medicine? This does not apply. This vaccine is given in a clinic, pharmacy, doctor's office, or other health care setting and will not be stored at home. NOTE: This sheet is a summary. It may not cover all possible information. If you have questions about this medicine, talk to your doctor, pharmacist, or health care provider.  2020 Elsevier/Gold Standard (2007-11-17 14:32:37)

## 2019-02-28 NOTE — Progress Notes (Signed)
Pre visit review using our clinic review tool, if applicable. No additional management support is needed unless otherwise documented below in the visit note. 

## 2019-03-01 ENCOUNTER — Other Ambulatory Visit: Payer: Self-pay

## 2019-03-01 ENCOUNTER — Ambulatory Visit (INDEPENDENT_AMBULATORY_CARE_PROVIDER_SITE_OTHER): Payer: Medicare Other

## 2019-03-01 ENCOUNTER — Other Ambulatory Visit (INDEPENDENT_AMBULATORY_CARE_PROVIDER_SITE_OTHER): Payer: Medicare Other

## 2019-03-01 DIAGNOSIS — N400 Enlarged prostate without lower urinary tract symptoms: Secondary | ICD-10-CM

## 2019-03-01 DIAGNOSIS — E039 Hypothyroidism, unspecified: Secondary | ICD-10-CM | POA: Diagnosis not present

## 2019-03-01 DIAGNOSIS — E119 Type 2 diabetes mellitus without complications: Secondary | ICD-10-CM | POA: Diagnosis not present

## 2019-03-01 DIAGNOSIS — Z23 Encounter for immunization: Secondary | ICD-10-CM

## 2019-03-01 DIAGNOSIS — I10 Essential (primary) hypertension: Secondary | ICD-10-CM

## 2019-03-01 DIAGNOSIS — Z125 Encounter for screening for malignant neoplasm of prostate: Secondary | ICD-10-CM | POA: Diagnosis not present

## 2019-03-01 LAB — CBC WITH DIFFERENTIAL/PLATELET
Basophils Absolute: 0 10*3/uL (ref 0.0–0.1)
Basophils Relative: 0.5 % (ref 0.0–3.0)
Eosinophils Absolute: 0.1 10*3/uL (ref 0.0–0.7)
Eosinophils Relative: 1.2 % (ref 0.0–5.0)
HCT: 49.3 % (ref 39.0–52.0)
Hemoglobin: 16.2 g/dL (ref 13.0–17.0)
Lymphocytes Relative: 29.2 % (ref 12.0–46.0)
Lymphs Abs: 2.2 10*3/uL (ref 0.7–4.0)
MCHC: 33 g/dL (ref 30.0–36.0)
MCV: 91.3 fl (ref 78.0–100.0)
Monocytes Absolute: 0.7 10*3/uL (ref 0.1–1.0)
Monocytes Relative: 9.2 % (ref 3.0–12.0)
Neutro Abs: 4.6 10*3/uL (ref 1.4–7.7)
Neutrophils Relative %: 59.9 % (ref 43.0–77.0)
Platelets: 154 10*3/uL (ref 150.0–400.0)
RBC: 5.39 Mil/uL (ref 4.22–5.81)
RDW: 15.3 % (ref 11.5–15.5)
WBC: 7.6 10*3/uL (ref 4.0–10.5)

## 2019-03-01 LAB — LIPID PANEL
Cholesterol: 80 mg/dL (ref 0–200)
HDL: 24.3 mg/dL — ABNORMAL LOW (ref >39.00)
LDL Cholesterol: 27 mg/dL (ref 0–99)
NonHDL: 55.97
Total CHOL/HDL Ratio: 3
Triglycerides: 144 mg/dL (ref 0.0–149.0)
VLDL: 28.8 mg/dL (ref 0.0–40.0)

## 2019-03-01 LAB — COMPREHENSIVE METABOLIC PANEL
ALT: 26 U/L (ref 0–53)
AST: 26 U/L (ref 0–37)
Albumin: 4.2 g/dL (ref 3.5–5.2)
Alkaline Phosphatase: 83 U/L (ref 39–117)
BUN: 16 mg/dL (ref 6–23)
CO2: 29 mEq/L (ref 19–32)
Calcium: 9 mg/dL (ref 8.4–10.5)
Chloride: 102 mEq/L (ref 96–112)
Creatinine, Ser: 1.08 mg/dL (ref 0.40–1.50)
GFR: 66.88 mL/min (ref >60.00)
Glucose, Bld: 108 mg/dL — ABNORMAL HIGH (ref 70–99)
Potassium: 4 mEq/L (ref 3.5–5.1)
Sodium: 138 mEq/L (ref 135–145)
Total Bilirubin: 1 mg/dL (ref 0.2–1.2)
Total Protein: 7 g/dL (ref 6.0–8.3)

## 2019-03-01 LAB — TSH: TSH: 0.59 u[IU]/mL (ref 0.35–4.50)

## 2019-03-01 LAB — HEMOGLOBIN A1C: Hgb A1c MFr Bld: 6.8 % — ABNORMAL HIGH (ref 4.6–6.5)

## 2019-03-01 LAB — T4, FREE: Free T4: 0.89 ng/dL (ref 0.60–1.60)

## 2019-03-01 LAB — PSA: PSA: 0.9 ng/mL (ref 0.10–4.00)

## 2019-03-02 ENCOUNTER — Other Ambulatory Visit: Payer: Self-pay | Admitting: Internal Medicine

## 2019-03-02 DIAGNOSIS — E039 Hypothyroidism, unspecified: Secondary | ICD-10-CM

## 2019-03-02 LAB — URINALYSIS, ROUTINE W REFLEX MICROSCOPIC
Bilirubin Urine: NEGATIVE
Hgb urine dipstick: NEGATIVE
Ketones, ur: NEGATIVE
Leukocytes,Ua: NEGATIVE
Nitrite: NEGATIVE
Protein, ur: NEGATIVE
Specific Gravity, Urine: 1.025 (ref 1.001–1.03)
pH: 7 (ref 5.0–8.0)

## 2019-03-02 MED ORDER — LIOTHYRONINE SODIUM 5 MCG PO TABS: 2.5000 ug | ORAL_TABLET | ORAL | 3 refills | Status: DC

## 2019-03-02 MED ORDER — LEVOTHYROXINE SODIUM 200 MCG PO TABS: 200.0000 ug | ORAL_TABLET | Freq: Every day | ORAL | 3 refills | Status: DC

## 2019-03-25 ENCOUNTER — Other Ambulatory Visit: Payer: Self-pay | Admitting: Physician Assistant

## 2019-04-02 DIAGNOSIS — M1711 Unilateral primary osteoarthritis, right knee: Secondary | ICD-10-CM | POA: Diagnosis not present

## 2019-04-04 ENCOUNTER — Telehealth: Payer: Self-pay | Admitting: Internal Medicine

## 2019-04-04 DIAGNOSIS — E1165 Type 2 diabetes mellitus with hyperglycemia: Secondary | ICD-10-CM | POA: Diagnosis not present

## 2019-04-04 DIAGNOSIS — M170 Bilateral primary osteoarthritis of knee: Secondary | ICD-10-CM | POA: Diagnosis not present

## 2019-04-04 DIAGNOSIS — K219 Gastro-esophageal reflux disease without esophagitis: Secondary | ICD-10-CM | POA: Diagnosis not present

## 2019-04-04 DIAGNOSIS — I10 Essential (primary) hypertension: Secondary | ICD-10-CM | POA: Diagnosis not present

## 2019-04-04 DIAGNOSIS — Z01818 Encounter for other preprocedural examination: Secondary | ICD-10-CM | POA: Diagnosis not present

## 2019-04-04 NOTE — Telephone Encounter (Signed)
° °  [facility] Pre-operative Risk Assessment    Request for surgical clearance:  1. What type of surgery is being performed? L knee Replacement   2. When is this surgery scheduled?  04/30/19   3. What type of clearance is required (medical clearance vs. Pharmacy clearance to hold med vs. Both)?  Both   4. Are there any medications that need to be held prior to surgery and how long? plavix ans asa 7 days   5. Practice name and name of physician performing surgery?  [facility] / Dr. Woodfin Ganja performing procedure   6. What is your office phone number [phone]   7.   What is your office fax number [phone]   8.   Anesthesia type (None, local, MAC, general) ? Moderate    Clarisse Gouge 04/04/2019, 1:00 PM  _________________________________________________________________   (provider comments below)

## 2019-04-04 NOTE — Telephone Encounter (Signed)
I called and left message for patient to call back.  (See Dr Darnelle Bos note from August) Kerin Ransom PA-C 04/04/2019 2:17 PM

## 2019-04-04 NOTE — Telephone Encounter (Signed)
Follow up ° ° ° ° °Please return call to patient °

## 2019-04-04 NOTE — Telephone Encounter (Signed)
   Primary Cardiologist: Dr End  Chart reviewed and patient contacted by phone today as part of pre-operative protocol coverage. Given past medical history and time since last visit, based on ACC/AHA guidelines, [facility] [place] would be at acceptable risk for the planned procedure without further cardiovascular testing.   Dr End feels it would be safe to hold Plavix 7 days pre op but would like the patient to continue aspirin 81 mg perioperatively if acceptable from a bleeding standpoint.  I will route this recommendation to the requesting party via Epic fax function and remove from pre-op pool.  Please call with questions.  Kerin Ransom, PA-C 04/04/2019, 3:29 PM

## 2019-04-24 ENCOUNTER — Ambulatory Visit (INDEPENDENT_AMBULATORY_CARE_PROVIDER_SITE_OTHER): Payer: Medicare Other | Admitting: Urology

## 2019-04-24 ENCOUNTER — Other Ambulatory Visit: Payer: Self-pay

## 2019-04-24 ENCOUNTER — Encounter: Payer: Self-pay | Admitting: Urology

## 2019-04-24 VITALS — BP 151/70 | HR 64 | Ht 70.0 in | Wt 245.0 lb

## 2019-04-24 DIAGNOSIS — N5201 Erectile dysfunction due to arterial insufficiency: Secondary | ICD-10-CM

## 2019-04-24 DIAGNOSIS — N138 Other obstructive and reflux uropathy: Secondary | ICD-10-CM

## 2019-04-24 DIAGNOSIS — N401 Enlarged prostate with lower urinary tract symptoms: Secondary | ICD-10-CM

## 2019-04-24 HISTORY — DX: Erectile dysfunction due to arterial insufficiency: N52.01

## 2019-04-24 NOTE — Progress Notes (Signed)
04/24/2019 1:03 PM   Tony Lawrence 1945/11/09 [id number]  Referring provider: McLean-Scocuzza, Nino Glow, MD [place],  [place] [id number]  Chief Complaint  Patient presents with  . Benign Prostatic Hypertrophy    HPI: 73 y.o. male presents for annual follow-up.  His voiding symptoms have not significantly changed in the last 12 months and he states are not bothersome enough that he desires medical or surgical management.  Denies dysuria or gross hematuria.  No flank, abdominal or pelvic pain.  A PSA was ordered by his PCP November 2020 and was stable at 0.9.  Since his visit last year he has noted increased difficulty achieving and maintaining an erection.  Organic risk factors include coronary artery disease, diabetes, hyperlipidemia, hypertension and antihypertensive medications.  He is on chronic isosorbide nitrate therapy daily.   PMH: Past Medical History:  Diagnosis Date  . Allergy   . Arthritis    low back pain s/p shots and blocks, knee pain  . BCC (basal cell carcinoma of skin)    forehead and chest Dr. Evorn Gong q 6 months   . CAD (coronary artery disease)   . Complication of anesthesia    SEVERE SORE THROAT AFTER BIOPSY 2010  . Coronary artery disease    Chronic total occlusion of mid LAD  . Diabetes mellitus without complication ([place])   . GERD (gastroesophageal reflux disease)   . History of chicken pox   . History of shingles   . Hyperlipidemia   . Hypertension   . Hypothyroidism   . OSA on CPAP   . 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 Tami Ribas, [facility] H[facility]     Surgical History: Past Surgical History:  Procedure Laterality Date  . 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 CARDIAC CATHETERIZATION N/A 11/10/2015   Procedure: Left Heart Cath and Coronary Angiography;  Surgeon: Wellington Hampshire, MD;  Location: [facility];  Service: Cardiovascular;  Laterality: N/A;  . COLONOSCOPY  05/2006    . COLONOSCOPY WITH PROPOFOL N/A 05/24/2017   Procedure: COLONOSCOPY WITH PROPOFOL;  Surgeon: Lollie Sails, MD;  Location: [facility] ENDOSCOPY;  Service: Endoscopy;  Laterality: N/A;  . EXCISION OF TONGUE LESION N/A 11/11/2015   Procedure: EXCISION OF TONGUE LESION/ WITH FROZEN SECTION;  Surgeon: Beverly Gust, MD;  Location: [facility] ORS;  Service: ENT;  Laterality: N/A;    Home Medications:  Allergies as of 04/24/2019      Reactions   Penicillins Hives, Rash, Other (See Comments), Swelling   Has patient had a PCN reaction causing immediate rash, facial/tongue/throat swelling, SOB or lightheadedness with hypotension: Yes Has patient had a PCN reaction causing severe rash involving mucus membranes or skin necrosis: No Has patient had a PCN reaction that required hospitalization No Has patient had a PCN reaction occurring within the last 10 years: No If all of the above answers are "NO", then may proceed with Cephalosporin use.      Medication List       Accurate as of April 24, 2019  1:03 PM. If you have any questions, ask your nurse or doctor.        aspirin 81 MG tablet Take 81 mg by mouth daily.   atorvastatin 20 MG tablet Commonly known as: LIPITOR Take 1 tablet (20 mg total) by mouth daily at 6 PM. At night   bisoprolol-hydrochlorothiazide 2.5-6.25 MG tablet Commonly known as:  ZIAC Take 1 tablet by mouth daily.   CENTRUM SILVER 50+MEN PO Take 1 tablet by mouth every morning.   clopidogrel 75 MG tablet Commonly known as: PLAVIX TAKE ONE TABLET BY MOUTH DAILY   empagliflozin 25 MG Tabs tablet Commonly known as: Jardiance Take 25 mg by mouth daily.   glimepiride 1 MG tablet Commonly known as: AMARYL TAKE ONE TABLET BY MOUTH EVERY MORNING AND TAKE ONE TABLET BY MOUTH WITH SUPPER   isosorbide mononitrate 30 MG 24 hr tablet Commonly known as: IMDUR TAKE ONE TABLET BY MOUTH EVERY MORNING AND TAKE TWO TABLETS BY MOUTH EVERY EVENING   levothyroxine 200 MCG  tablet Commonly known as: SYNTHROID Take 1 tablet (200 mcg total) by mouth daily. In am on empty stomach   liothyronine 5 MCG tablet Commonly known as: CYTOMEL Take 0.5 tablets (2.5 mcg total) by mouth every other day.   metFORMIN 1000 MG tablet Commonly known as: GLUCOPHAGE Take 1 tablet (1,000 mg total) by mouth daily with breakfast.   omeprazole 40 MG capsule Commonly known as: PRILOSEC Take 1 capsule (40 mg total) by mouth daily.   ramipril 10 MG capsule Commonly known as: Altace Take 2 capsules (20 mg total) by mouth daily.       Allergies:  Allergies  Allergen Reactions  . Penicillins Hives, Rash, Other (See Comments) and Swelling    Has patient had a PCN reaction causing immediate rash, facial/tongue/throat swelling, SOB or lightheadedness with hypotension: Yes Has patient had a PCN reaction causing severe rash involving mucus membranes or skin necrosis: No Has patient had a PCN reaction that required hospitalization No Has patient had a PCN reaction occurring within the last 10 years: No If all of the above answers are "NO", then may proceed with Cephalosporin use.     Family History: Family History  Problem Relation Age of Onset  . Myelodysplastic syndrome Mother   . Arthritis Mother   . Emphysema Father   . Alcohol abuse Father   . COPD Father   . Depression Father   . Early death Maternal Grandfather   . Heart disease Maternal Grandfather   . Stroke Maternal Grandfather     Social History:  reports that he has never smoked. He has never used smokeless tobacco. He reports current alcohol use. He reports that he does not use drugs.  ROS: UROLOGY Frequent Urination?: Yes Hard to postpone urination?: No Burning/pain with urination?: No Get up at night to urinate?: Yes Leakage of urine?: No Urine stream starts and stops?: No Trouble starting stream?: No Do you have to strain to urinate?: No Blood in urine?: No Urinary tract infection?: No Sexually  transmitted disease?: No Injury to kidneys or bladder?: No Painful intercourse?: No Weak stream?: No Erection problems?: Yes Penile pain?: No  Gastrointestinal Nausea?: No Vomiting?: No Indigestion/heartburn?: No Diarrhea?: Yes Constipation?: No  Constitutional Fever: No Night sweats?: No Weight loss?: No Fatigue?: No  Skin Skin rash/lesions?: No Itching?: No  Eyes Blurred vision?: No Double vision?: No  Ears/Nose/Throat Sore throat?: No Sinus problems?: No  Hematologic/Lymphatic Swollen glands?: No Easy bruising?: No  Cardiovascular Leg swelling?: No Chest pain?: No  Respiratory Cough?: No Shortness of breath?: No  Endocrine Excessive thirst?: No  Musculoskeletal Back pain?: Yes Joint pain?: Yes  Neurological Headaches?: No Dizziness?: No  Psychologic Depression?: No Anxiety?: No  Physical Exam: BP (!) 151/70   Pulse 64   Ht 5\' 10"  (1.778 m)   Wt 245 lb (111.1 kg)  BMI 35.15 kg/m   Constitutional:  Alert and oriented, No acute distress. HEENT: [place] AT, moist mucus membranes.  Trachea midline, no masses. Cardiovascular: No clubbing, cyanosis, or edema. Respiratory: Normal respiratory effort, no increased work of breathing. Skin: No rashes, bruises or suspicious lesions. Neurologic: Grossly intact, no focal deficits, moving all 4 extremities. Psychiatric: Normal mood and affect.  Laboratory Data:  Lab Results  Component Value Date   PSA 0.90 03/01/2019     Assessment & Plan:    - BPH with lower urinary tract symptoms Stable, mild lower urinary tract symptoms and he desires to continue surveillance.  Continue annual follow-up.  - Erectile dysfunction He is on chronic nitrate therapy and is not a candidate for PDE 5 inhibitors.  I did discuss vacuum erection devices and intracavernosal injections however he does not desire to pursue treatment at this time.  - Prostate cancer screening We discussed current prostate cancer screening  guidelines and PSA/DRE recommended annually in men between the ages of 26 and 21.  This can be continued to age 2 in healthy patients.  He would like to continue screening.  Abbie Sons, [place] [address], [place], [place] [address] [phone]

## 2019-04-26 ENCOUNTER — Ambulatory Visit: Payer: Medicare Other | Admitting: Urology

## 2019-04-28 DIAGNOSIS — Z20822 Contact with and (suspected) exposure to covid-19: Secondary | ICD-10-CM | POA: Diagnosis not present

## 2019-04-30 ENCOUNTER — Ambulatory Visit: Payer: Medicare Other | Admitting: Urology

## 2019-04-30 DIAGNOSIS — M21162 Varus deformity, not elsewhere classified, left knee: Secondary | ICD-10-CM | POA: Diagnosis not present

## 2019-04-30 DIAGNOSIS — M21262 Flexion deformity, left knee: Secondary | ICD-10-CM | POA: Diagnosis not present

## 2019-04-30 DIAGNOSIS — M23612 Other spontaneous disruption of anterior cruciate ligament of left knee: Secondary | ICD-10-CM | POA: Diagnosis not present

## 2019-04-30 DIAGNOSIS — G8918 Other acute postprocedural pain: Secondary | ICD-10-CM | POA: Insufficient documentation

## 2019-04-30 DIAGNOSIS — M1712 Unilateral primary osteoarthritis, left knee: Secondary | ICD-10-CM | POA: Diagnosis not present

## 2019-05-01 DIAGNOSIS — G8918 Other acute postprocedural pain: Secondary | ICD-10-CM | POA: Diagnosis not present

## 2019-05-09 DIAGNOSIS — Z96652 Presence of left artificial knee joint: Secondary | ICD-10-CM | POA: Diagnosis not present

## 2019-05-09 DIAGNOSIS — M25662 Stiffness of left knee, not elsewhere classified: Secondary | ICD-10-CM | POA: Diagnosis not present

## 2019-05-09 DIAGNOSIS — M6281 Muscle weakness (generalized): Secondary | ICD-10-CM | POA: Diagnosis not present

## 2019-05-09 DIAGNOSIS — G8929 Other chronic pain: Secondary | ICD-10-CM | POA: Diagnosis not present

## 2019-05-09 DIAGNOSIS — M25562 Pain in left knee: Secondary | ICD-10-CM | POA: Diagnosis not present

## 2019-05-11 DIAGNOSIS — M25562 Pain in left knee: Secondary | ICD-10-CM | POA: Diagnosis not present

## 2019-05-11 DIAGNOSIS — M25662 Stiffness of left knee, not elsewhere classified: Secondary | ICD-10-CM | POA: Diagnosis not present

## 2019-05-11 DIAGNOSIS — G8929 Other chronic pain: Secondary | ICD-10-CM | POA: Diagnosis not present

## 2019-05-11 DIAGNOSIS — M6281 Muscle weakness (generalized): Secondary | ICD-10-CM | POA: Diagnosis not present

## 2019-05-11 DIAGNOSIS — Z96652 Presence of left artificial knee joint: Secondary | ICD-10-CM | POA: Diagnosis not present

## 2019-05-14 DIAGNOSIS — G8929 Other chronic pain: Secondary | ICD-10-CM | POA: Diagnosis not present

## 2019-05-14 DIAGNOSIS — M25562 Pain in left knee: Secondary | ICD-10-CM | POA: Diagnosis not present

## 2019-05-14 DIAGNOSIS — Z96652 Presence of left artificial knee joint: Secondary | ICD-10-CM | POA: Diagnosis not present

## 2019-05-14 DIAGNOSIS — M6281 Muscle weakness (generalized): Secondary | ICD-10-CM | POA: Diagnosis not present

## 2019-05-14 DIAGNOSIS — M25662 Stiffness of left knee, not elsewhere classified: Secondary | ICD-10-CM | POA: Diagnosis not present

## 2019-05-15 DIAGNOSIS — Z96652 Presence of left artificial knee joint: Secondary | ICD-10-CM | POA: Diagnosis not present

## 2019-05-15 DIAGNOSIS — M1712 Unilateral primary osteoarthritis, left knee: Secondary | ICD-10-CM | POA: Diagnosis not present

## 2019-05-16 DIAGNOSIS — M25562 Pain in left knee: Secondary | ICD-10-CM | POA: Diagnosis not present

## 2019-05-16 DIAGNOSIS — Z96652 Presence of left artificial knee joint: Secondary | ICD-10-CM | POA: Diagnosis not present

## 2019-05-17 ENCOUNTER — Encounter: Payer: Self-pay | Admitting: Internal Medicine

## 2019-05-18 DIAGNOSIS — M6281 Muscle weakness (generalized): Secondary | ICD-10-CM | POA: Diagnosis not present

## 2019-05-18 DIAGNOSIS — Z96652 Presence of left artificial knee joint: Secondary | ICD-10-CM | POA: Diagnosis not present

## 2019-05-18 DIAGNOSIS — G8929 Other chronic pain: Secondary | ICD-10-CM | POA: Diagnosis not present

## 2019-05-18 DIAGNOSIS — M25662 Stiffness of left knee, not elsewhere classified: Secondary | ICD-10-CM | POA: Diagnosis not present

## 2019-05-18 DIAGNOSIS — M25562 Pain in left knee: Secondary | ICD-10-CM | POA: Diagnosis not present

## 2019-05-21 DIAGNOSIS — G8929 Other chronic pain: Secondary | ICD-10-CM | POA: Diagnosis not present

## 2019-05-21 DIAGNOSIS — M25562 Pain in left knee: Secondary | ICD-10-CM | POA: Diagnosis not present

## 2019-05-21 DIAGNOSIS — M6281 Muscle weakness (generalized): Secondary | ICD-10-CM | POA: Diagnosis not present

## 2019-05-21 DIAGNOSIS — M25662 Stiffness of left knee, not elsewhere classified: Secondary | ICD-10-CM | POA: Diagnosis not present

## 2019-05-21 DIAGNOSIS — Z96652 Presence of left artificial knee joint: Secondary | ICD-10-CM | POA: Diagnosis not present

## 2019-05-23 DIAGNOSIS — M25662 Stiffness of left knee, not elsewhere classified: Secondary | ICD-10-CM | POA: Diagnosis not present

## 2019-05-23 DIAGNOSIS — Z96652 Presence of left artificial knee joint: Secondary | ICD-10-CM | POA: Diagnosis not present

## 2019-05-23 DIAGNOSIS — Z85810 Personal history of malignant neoplasm of tongue: Secondary | ICD-10-CM | POA: Diagnosis not present

## 2019-05-23 DIAGNOSIS — M25562 Pain in left knee: Secondary | ICD-10-CM | POA: Diagnosis not present

## 2019-05-23 DIAGNOSIS — M6281 Muscle weakness (generalized): Secondary | ICD-10-CM | POA: Diagnosis not present

## 2019-05-23 DIAGNOSIS — G8929 Other chronic pain: Secondary | ICD-10-CM | POA: Diagnosis not present

## 2019-05-25 DIAGNOSIS — M6281 Muscle weakness (generalized): Secondary | ICD-10-CM | POA: Diagnosis not present

## 2019-05-25 DIAGNOSIS — M25562 Pain in left knee: Secondary | ICD-10-CM | POA: Diagnosis not present

## 2019-05-25 DIAGNOSIS — Z96652 Presence of left artificial knee joint: Secondary | ICD-10-CM | POA: Diagnosis not present

## 2019-05-25 DIAGNOSIS — M25662 Stiffness of left knee, not elsewhere classified: Secondary | ICD-10-CM | POA: Diagnosis not present

## 2019-05-25 DIAGNOSIS — G8929 Other chronic pain: Secondary | ICD-10-CM | POA: Diagnosis not present

## 2019-05-28 DIAGNOSIS — M25562 Pain in left knee: Secondary | ICD-10-CM | POA: Diagnosis not present

## 2019-05-28 DIAGNOSIS — Z96652 Presence of left artificial knee joint: Secondary | ICD-10-CM | POA: Diagnosis not present

## 2019-05-30 DIAGNOSIS — M25662 Stiffness of left knee, not elsewhere classified: Secondary | ICD-10-CM | POA: Diagnosis not present

## 2019-05-30 DIAGNOSIS — Z96652 Presence of left artificial knee joint: Secondary | ICD-10-CM | POA: Diagnosis not present

## 2019-05-30 DIAGNOSIS — G8929 Other chronic pain: Secondary | ICD-10-CM | POA: Diagnosis not present

## 2019-05-30 DIAGNOSIS — M6281 Muscle weakness (generalized): Secondary | ICD-10-CM | POA: Diagnosis not present

## 2019-05-30 DIAGNOSIS — M25562 Pain in left knee: Secondary | ICD-10-CM | POA: Diagnosis not present

## 2019-06-01 DIAGNOSIS — Z96652 Presence of left artificial knee joint: Secondary | ICD-10-CM | POA: Diagnosis not present

## 2019-06-04 DIAGNOSIS — Z96652 Presence of left artificial knee joint: Secondary | ICD-10-CM | POA: Diagnosis not present

## 2019-06-04 DIAGNOSIS — M25562 Pain in left knee: Secondary | ICD-10-CM | POA: Diagnosis not present

## 2019-06-06 DIAGNOSIS — Z96652 Presence of left artificial knee joint: Secondary | ICD-10-CM | POA: Diagnosis not present

## 2019-06-06 DIAGNOSIS — G8929 Other chronic pain: Secondary | ICD-10-CM | POA: Diagnosis not present

## 2019-06-06 DIAGNOSIS — M25662 Stiffness of left knee, not elsewhere classified: Secondary | ICD-10-CM | POA: Diagnosis not present

## 2019-06-06 DIAGNOSIS — M25562 Pain in left knee: Secondary | ICD-10-CM | POA: Diagnosis not present

## 2019-06-06 DIAGNOSIS — M6281 Muscle weakness (generalized): Secondary | ICD-10-CM | POA: Diagnosis not present

## 2019-06-08 ENCOUNTER — Ambulatory Visit (INDEPENDENT_AMBULATORY_CARE_PROVIDER_SITE_OTHER): Payer: Medicare Other | Admitting: Physician Assistant

## 2019-06-08 ENCOUNTER — Other Ambulatory Visit: Payer: Self-pay

## 2019-06-08 ENCOUNTER — Encounter: Payer: Self-pay | Admitting: Physician Assistant

## 2019-06-08 VITALS — BP 170/80 | HR 82 | Temp 98.3°F | Resp 16 | Ht 72.0 in | Wt 250.0 lb

## 2019-06-08 DIAGNOSIS — G8929 Other chronic pain: Secondary | ICD-10-CM | POA: Diagnosis not present

## 2019-06-08 DIAGNOSIS — M25662 Stiffness of left knee, not elsewhere classified: Secondary | ICD-10-CM | POA: Diagnosis not present

## 2019-06-08 DIAGNOSIS — M6281 Muscle weakness (generalized): Secondary | ICD-10-CM | POA: Diagnosis not present

## 2019-06-08 DIAGNOSIS — R310 Gross hematuria: Secondary | ICD-10-CM | POA: Diagnosis not present

## 2019-06-08 DIAGNOSIS — Z96652 Presence of left artificial knee joint: Secondary | ICD-10-CM | POA: Diagnosis not present

## 2019-06-08 DIAGNOSIS — M25562 Pain in left knee: Secondary | ICD-10-CM | POA: Diagnosis not present

## 2019-06-08 LAB — URINALYSIS, COMPLETE
Bilirubin, UA: NEGATIVE
Ketones, UA: NEGATIVE
Leukocytes,UA: NEGATIVE
Nitrite, UA: NEGATIVE
Protein,UA: NEGATIVE
Specific Gravity, UA: 1.015 (ref 1.005–1.030)
Urobilinogen, Ur: 0.2 mg/dL (ref 0.2–1.0)
pH, UA: 5 (ref 5.0–7.5)

## 2019-06-08 LAB — MICROSCOPIC EXAMINATION: Bacteria, UA: NONE SEEN

## 2019-06-08 NOTE — Progress Notes (Signed)
06/08/2019 3:30 PM   Tony Lawrence 12-08-1945 [id number]  CC: Gross hematuria  HPI: Tony Lawrence is a 74 y.o. male with PMH BPH and CAD on Plavix and aspirin who presents today for evaluation of gross hematuria.  Today, he reports a 2-week history of intermittent gross hematuria, pink in color.  He originally noticed it 2 weeks ago when he was on an increased dose of baby aspirin following knee surgery.  He states that he stopped the extra dose of baby aspirin with resolution of gross hematuria.  However, he states that the gross hematuria resumed this morning and was especially noticeable with termination of urination.  He denies flank or bladder pain, fever, chills, nausea, or vomiting.  He states he does have a history of gross hematuria similar to what he is currently reporting approximately 8 to 10 years ago, however it resolved on its own and he did not seek care for it.  He is a never smoker.  He denies a history of nephrolithiasis.  He denies a family history of renal or bladder cancers.  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  In-office UA today positive for trace-intact blood; urine microscopy pan negative.  PMH: Past Medical History:  Diagnosis Date  . Allergy   . Arthritis    low back pain s/p shots and blocks, knee pain  . BCC (basal cell carcinoma of skin)    forehead and chest Dr. Evorn Gong q 6 months   . CAD (coronary artery disease)   . Complication of anesthesia    SEVERE SORE THROAT AFTER BIOPSY 2010  . Coronary artery disease    Chronic total occlusion of mid LAD  . Diabetes mellitus without complication ([place])   . GERD (gastroesophageal reflux disease)   . History of chicken pox   . History of shingles   . Hyperlipidemia   . Hypertension   . Hypothyroidism   . OSA on CPAP   . 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 Tami Ribas, [facility] H/o     Surgical History: Past Surgical History:  Procedure Laterality Date  . 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 CARDIAC CATHETERIZATION N/A 11/10/2015   Procedure: Left Heart Cath and Coronary Angiography;  Surgeon: Wellington Hampshire, MD;  Location: [facility];  Service: Cardiovascular;  Laterality: N/A;  . COLONOSCOPY  05/2006  . COLONOSCOPY WITH PROPOFOL N/A 05/24/2017   Procedure: COLONOSCOPY WITH PROPOFOL;  Surgeon: Lollie Sails, MD;  Location: [facility] ENDOSCOPY;  Service: Endoscopy;  Laterality: N/A;  . EXCISION OF TONGUE LESION N/A 11/11/2015   Procedure: EXCISION OF TONGUE LESION/ WITH FROZEN SECTION;  Surgeon: Beverly Gust, MD;  Location: [facility] ORS;  Service: ENT;  Laterality: N/A;    Home Medications:  Allergies as of 06/08/2019      Reactions   Penicillins Hives, Rash, Other (See Comments), Swelling   Has patient had a PCN reaction causing immediate rash, facial/tongue/throat swelling, SOB or lightheadedness with hypotension: Yes Has patient had a PCN reaction causing severe rash involving mucus membranes or skin necrosis: No Has patient had a PCN reaction that required hospitalization No Has patient had a PCN reaction occurring within the last 10 years: No If all of the above answers are "NO", then may proceed with Cephalosporin use.  Medication List       Accurate as of June 08, 2019  3:30 PM. If you have any questions, ask your nurse or doctor.        aspirin 81 MG tablet Take 81 mg by mouth daily.   atorvastatin 20 MG tablet Commonly known as: LIPITOR Take 1 tablet (20 mg total) by mouth daily at 6 PM. At night   bisoprolol-hydrochlorothiazide 2.5-6.25 MG tablet Commonly known as: ZIAC Take 1 tablet by mouth daily.   CENTRUM SILVER 50+MEN PO Take 1 tablet by mouth every morning.   clopidogrel 75 MG tablet Commonly known as: PLAVIX TAKE ONE TABLET BY MOUTH DAILY   empagliflozin 25 MG Tabs tablet Commonly  known as: Jardiance Take 25 mg by mouth daily.   glimepiride 1 MG tablet Commonly known as: AMARYL TAKE ONE TABLET BY MOUTH EVERY MORNING AND TAKE ONE TABLET BY MOUTH WITH SUPPER   isosorbide mononitrate 30 MG 24 hr tablet Commonly known as: IMDUR TAKE ONE TABLET BY MOUTH EVERY MORNING AND TAKE TWO TABLETS BY MOUTH EVERY EVENING   levothyroxine 200 MCG tablet Commonly known as: SYNTHROID Take 1 tablet (200 mcg total) by mouth daily. In am on empty stomach   liothyronine 5 MCG tablet Commonly known as: CYTOMEL Take 0.5 tablets (2.5 mcg total) by mouth every other day.   metFORMIN 1000 MG tablet Commonly known as: GLUCOPHAGE Take 1 tablet (1,000 mg total) by mouth daily with breakfast.   omeprazole 40 MG capsule Commonly known as: PRILOSEC Take 1 capsule (40 mg total) by mouth daily.   ramipril 10 MG capsule Commonly known as: Altace Take 2 capsules (20 mg total) by mouth daily.       Allergies:  Allergies  Allergen Reactions  . Penicillins Hives, Rash, Other (See Comments) and Swelling    Has patient had a PCN reaction causing immediate rash, facial/tongue/throat swelling, SOB or lightheadedness with hypotension: Yes Has patient had a PCN reaction causing severe rash involving mucus membranes or skin necrosis: No Has patient had a PCN reaction that required hospitalization No Has patient had a PCN reaction occurring within the last 10 years: No If all of the above answers are "NO", then may proceed with Cephalosporin use.     Family History: Family History  Problem Relation Age of Onset  . Myelodysplastic syndrome Mother   . Arthritis Mother   . Emphysema Father   . Alcohol abuse Father   . COPD Father   . Depression Father   . Early death Maternal Grandfather   . Heart disease Maternal Grandfather   . Stroke Maternal Grandfather     Social History:   reports that he has never smoked. He has never used smokeless tobacco. He reports current alcohol use. He  reports that he does not use drugs.  Physical Exam: BP (!) 170/80   Pulse 82   Temp 98.3 F (36.8 C)   Resp 16   Ht 6' (1.829 m)   Wt 250 lb (113.4 kg)   SpO2 96%   BMI 33.91 kg/m   Constitutional:  Alert and oriented, no acute distress, nontoxic appearing HEENT: [place], AT Cardiovascular: No clubbing, cyanosis, or edema Respiratory: Normal respiratory effort, no increased work of breathing Skin: No rashes, bruises or suspicious lesions Neurologic: Grossly intact, no focal deficits, moving all 4 extremities Psychiatric: Normal mood and affect  Laboratory Data: Results for orders placed or performed in visit on 06/08/19  Microscopic Examination   URINE  Result Value Ref  Range   WBC, UA 0-5 0 - 5 /hpf   RBC 0-2 0 - 2 /hpf   Epithelial Cells (non renal) 0-10 0 - 10 /hpf   Bacteria, UA None seen None seen/Few  Urinalysis, Complete  Result Value Ref Range   Specific Gravity, UA 1.015 1.005 - 1.030   pH, UA 5.0 5.0 - 7.5   Color, UA Yellow Yellow   Appearance Ur Clear Clear   Leukocytes,UA Negative Negative   Protein,UA Negative Negative/Trace   Glucose, UA 2+ (A) Negative   Ketones, UA Negative Negative   RBC, UA Trace (A) Negative   Bilirubin, UA Negative Negative   Urobilinogen, Ur 0.2 0.2 - 1.0 mg/dL   Nitrite, UA Negative Negative   Microscopic Examination See below:    Assessment & Plan:   1. Gross hematuria 74 year old male with CAD on anticoagulation as well as BPH presents with a 2-week history of intermittent painless gross hematuria.  No significant risk factors for malignancy and UA benign.  I had a lengthy conversation with the patient today regarding the etiologies of gross hematuria.  I explained that causes of blood in the urine include infection, stones, anticoagulation, BPH, renal cyst, and tumors of the bladder or kidneys.  I explained that given the seriousness of possible malignancies in this case, it is necessary to complete a thorough imaging of the  urinary tract to rule them out.  Recommend CT hematuria with follow-up cystoscopy in 1 month.  Patient expressed understanding. I counseled the patient on signs and symptoms of gross hematuria requiring urgent evaluation, including thick, ketchup-like urinary output; passage of large clots; dark, maroon-colored urine; lower abdominal pain; lumbar pain; abdominal distention; and the inability to urinate.  I advised him to contact the office for assistance if he develops these symptoms during routine office hours, 8 AM to 5 PM Monday through Friday.  If outside those hours, I advised him to proceed to the emergency department. He expressed understanding.   - Urinalysis, Complete - CT HEMATURIA WORKUP  Return in about 4 weeks (around 07/06/2019) for Cystoscopy with CTU prior with Dr. Bernardo Heater.  Debroah Loop, PA-C  [facility] [address] [place],  [address] [phone]

## 2019-06-09 ENCOUNTER — Ambulatory Visit: Payer: Medicare Other | Attending: Internal Medicine

## 2019-06-09 DIAGNOSIS — Z23 Encounter for immunization: Secondary | ICD-10-CM

## 2019-06-09 NOTE — Progress Notes (Signed)
   Covid-19 Vaccination Clinic  Name:  Tony Lawrence    MRN: [id number] DOB: 1945-06-14  06/09/2019  [name] was observed post Covid-19 immunization for 15 minutes without incidence. He was provided with Vaccine Information Sheet and instruction to access the V-Safe system.   [name] was instructed to call 911 with any severe reactions post vaccine: Marland Kitchen Difficulty breathing  . Swelling of your face and throat  . A fast heartbeat  . A bad rash all over your body  . Dizziness and weakness    Immunizations Administered    Name Date Dose VIS Date Route   Pfizer COVID-19 Vaccine 06/09/2019 11:47 AM 0.3 mL 04/06/2019 Intramuscular   Manufacturer: [place]   Lot: [id number]   [place]: [id number]

## 2019-06-11 DIAGNOSIS — G8929 Other chronic pain: Secondary | ICD-10-CM | POA: Diagnosis not present

## 2019-06-11 DIAGNOSIS — X32XXXA Exposure to sunlight, initial encounter: Secondary | ICD-10-CM | POA: Diagnosis not present

## 2019-06-11 DIAGNOSIS — M25562 Pain in left knee: Secondary | ICD-10-CM | POA: Diagnosis not present

## 2019-06-11 DIAGNOSIS — Z96652 Presence of left artificial knee joint: Secondary | ICD-10-CM | POA: Diagnosis not present

## 2019-06-11 DIAGNOSIS — M6281 Muscle weakness (generalized): Secondary | ICD-10-CM | POA: Diagnosis not present

## 2019-06-11 DIAGNOSIS — M25662 Stiffness of left knee, not elsewhere classified: Secondary | ICD-10-CM | POA: Diagnosis not present

## 2019-06-11 DIAGNOSIS — L570 Actinic keratosis: Secondary | ICD-10-CM | POA: Diagnosis not present

## 2019-06-11 DIAGNOSIS — M5136 Other intervertebral disc degeneration, lumbar region: Secondary | ICD-10-CM | POA: Diagnosis not present

## 2019-06-13 DIAGNOSIS — Z96652 Presence of left artificial knee joint: Secondary | ICD-10-CM | POA: Insufficient documentation

## 2019-06-13 DIAGNOSIS — M25562 Pain in left knee: Secondary | ICD-10-CM | POA: Diagnosis not present

## 2019-06-15 DIAGNOSIS — Z96652 Presence of left artificial knee joint: Secondary | ICD-10-CM | POA: Diagnosis not present

## 2019-06-15 DIAGNOSIS — M25562 Pain in left knee: Secondary | ICD-10-CM | POA: Diagnosis not present

## 2019-06-15 DIAGNOSIS — M25662 Stiffness of left knee, not elsewhere classified: Secondary | ICD-10-CM | POA: Diagnosis not present

## 2019-06-15 DIAGNOSIS — M6281 Muscle weakness (generalized): Secondary | ICD-10-CM | POA: Diagnosis not present

## 2019-06-15 DIAGNOSIS — G8929 Other chronic pain: Secondary | ICD-10-CM | POA: Diagnosis not present

## 2019-06-18 DIAGNOSIS — Z96652 Presence of left artificial knee joint: Secondary | ICD-10-CM | POA: Diagnosis not present

## 2019-06-18 DIAGNOSIS — G8929 Other chronic pain: Secondary | ICD-10-CM | POA: Diagnosis not present

## 2019-06-18 DIAGNOSIS — M25562 Pain in left knee: Secondary | ICD-10-CM | POA: Diagnosis not present

## 2019-06-18 DIAGNOSIS — M25662 Stiffness of left knee, not elsewhere classified: Secondary | ICD-10-CM | POA: Diagnosis not present

## 2019-06-18 DIAGNOSIS — M6281 Muscle weakness (generalized): Secondary | ICD-10-CM | POA: Diagnosis not present

## 2019-06-20 ENCOUNTER — Encounter: Payer: Self-pay | Admitting: Internal Medicine

## 2019-06-20 ENCOUNTER — Ambulatory Visit (INDEPENDENT_AMBULATORY_CARE_PROVIDER_SITE_OTHER): Payer: Medicare Other | Admitting: Internal Medicine

## 2019-06-20 ENCOUNTER — Other Ambulatory Visit: Payer: Self-pay

## 2019-06-20 VITALS — BP 140/78 | HR 79 | Ht 70.0 in | Wt 248.2 lb

## 2019-06-20 DIAGNOSIS — I10 Essential (primary) hypertension: Secondary | ICD-10-CM | POA: Diagnosis not present

## 2019-06-20 DIAGNOSIS — Z96652 Presence of left artificial knee joint: Secondary | ICD-10-CM | POA: Diagnosis not present

## 2019-06-20 DIAGNOSIS — E785 Hyperlipidemia, unspecified: Secondary | ICD-10-CM

## 2019-06-20 DIAGNOSIS — I25118 Atherosclerotic heart disease of native coronary artery with other forms of angina pectoris: Secondary | ICD-10-CM

## 2019-06-20 DIAGNOSIS — M25562 Pain in left knee: Secondary | ICD-10-CM | POA: Diagnosis not present

## 2019-06-20 NOTE — Progress Notes (Signed)
Follow-up Outpatient Visit Date: 06/20/2019  Primary Care Provider: McLean-Scocuzza, Nino Glow, MD [place] [id number]  Chief Complaint: Follow-up coronary artery disease  HPI:  Mr. Bett is a 74 y.o. male with history of coronary artery disease with chronic total occlusion of the mid LAD, hypertension, hyperlipidemia,diabetes mellitus, obstructive sleep apnea, and hypothyroidism, who presents for follow-up of coronary artery disease.  I last saw Mr. Larussa in 11/2018, which time he was doing well other than chronic arthritic pain with plans to undergo a left total knee replacement.  He was doing well from a heart standpoint.  No medication changes or additional testing were pursued.  Today, Mr. Plett reports that he is doing fairly well.  He is status post left total knee replacement about 7 weeks ago and continues to work with physical therapy.  His mobility is improving.  He has noted some leg swelling on the left after his surgery, though this is getting better.  He denies chest pain, shortness of breath, palpitations, lightheadedness, and orthopnea.  He is back on clopidogrel, which was held for his knee replacement.  He reports 2 episodes of transient gross hematuria for which he is undergoing further work-up by urology (CT and cystoscopy planned).  --------------------------------------------------------------------------------------------------  Past Medical History:  Diagnosis Date  . Allergy   . Arthritis    low back pain s/p shots and blocks, knee pain  . BCC (basal cell carcinoma of skin)    forehead and chest Dr. Evorn Gong q 6 months   . CAD (coronary artery disease)   . Complication of anesthesia    SEVERE SORE THROAT AFTER BIOPSY 2010  . Coronary artery disease    Chronic total occlusion of mid LAD  . Diabetes mellitus without complication ([place])   . GERD (gastroesophageal reflux disease)   . History of chicken pox   . History of shingles   .  Hyperlipidemia   . Hypertension   . Hypothyroidism   . OSA on CPAP   . 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 Tami Ribas, [facility]    Past Surgical History:  Procedure Laterality Date  . 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 CARDIAC CATHETERIZATION N/A 11/10/2015   Procedure: Left Heart Cath and Coronary Angiography;  Surgeon: Wellington Hampshire, MD;  Location: [place] [facility];  Service: Cardiovascular;  Laterality: N/A;  . COLONOSCOPY  05/2006  . COLONOSCOPY WITH PROPOFOL N/A 05/24/2017   Procedure: COLONOSCOPY WITH PROPOFOL;  Surgeon: Lollie Sails, MD;  Location: [facility] ENDOSCOPY;  Service: Endoscopy;  Laterality: N/A;  . EXCISION OF TONGUE LESION N/A 11/11/2015   Procedure: EXCISION OF TONGUE LESION/ WITH FROZEN SECTION;  Surgeon: Beverly Gust, MD;  Location: [facility] ORS;  Service: ENT;  Laterality: N/A;  . REPLACEMENT TOTAL KNEE Left     Current Meds  Medication Sig  . aspirin 81 MG tablet Take 81 mg by mouth daily.  Marland Kitchen atorvastatin (LIPITOR) 20 MG tablet Take 1 tablet (20 mg total) by mouth daily at 6 PM. At night  . bisoprolol-hydrochlorothiazide (ZIAC) 2.5-6.25 MG tablet Take 1 tablet by mouth daily.  . clopidogrel (PLAVIX) 75 MG tablet TAKE ONE TABLET BY MOUTH DAILY  . empagliflozin (JARDIANCE) 25 MG TABS tablet Take 25 mg by mouth daily.  Marland Kitchen glimepiride (AMARYL) 1 MG tablet TAKE ONE TABLET BY MOUTH EVERY MORNING AND TAKE ONE TABLET BY MOUTH WITH SUPPER  .  isosorbide mononitrate (IMDUR) 30 MG 24 hr tablet TAKE ONE TABLET BY MOUTH EVERY MORNING AND TAKE TWO TABLETS BY MOUTH EVERY EVENING  . levothyroxine (SYNTHROID) 200 MCG tablet Take 1 tablet (200 mcg total) by mouth daily. In am on empty stomach  . liothyronine (CYTOMEL) 5 MCG tablet Take 0.5 tablets (2.5 mcg total) by mouth every other day.  . metFORMIN (GLUCOPHAGE) 1000 MG tablet Take 1 tablet (1,000 mg total) by mouth daily with breakfast.  . Multiple Vitamins-Minerals  (CENTRUM SILVER 50+MEN PO) Take 1 tablet by mouth every morning.   Marland Kitchen omeprazole (PRILOSEC) 40 MG capsule Take 1 capsule (40 mg total) by mouth daily.  . ramipril (ALTACE) 10 MG capsule Take 2 capsules (20 mg total) by mouth daily.    Allergies: Penicillins  Social History   Tobacco Use  . Smoking status: Never Smoker  . Smokeless tobacco: Never Used  Substance Use Topics  . Alcohol use: Not Currently    Alcohol/week: 0.0 standard drinks    Comment: occassionally   . Drug use: No    Family History  Problem Relation Age of Onset  . Myelodysplastic syndrome Mother   . Arthritis Mother   . Emphysema Father   . Alcohol abuse Father   . COPD Father   . Depression Father   . Early death Maternal Grandfather   . Heart disease Maternal Grandfather   . Stroke Maternal Grandfather     Review of Systems: A 12-system review of systems was performed and was negative except as noted in the HPI.  --------------------------------------------------------------------------------------------------  Physical Exam: BP 140/78 (BP Location: Left Arm, Patient Position: Sitting, Cuff Size: Normal)   Pulse 79   Ht 5\' 10"  (1.778 m)   Wt 248 lb 4 oz (112.6 kg)   SpO2 96%   BMI 35.62 kg/m   General: NAD. Neck: No JVD or HJR. Respiratory: Lungs clear to auscultation bilaterally without wheezes or crackles. Heart: Regular rate and rhythm without murmurs. Abdomen: Soft, nontender, nondistended. Extremities: 1+ left calf edema.  No right lower extremity edema.  EKG: Normal sinus rhythm with left axis deviation.  Otherwise, no significant abnormality.  Lab Results  Component Value Date   WBC 7.6 03/01/2019   HGB 16.2 03/01/2019   HCT 49.3 03/01/2019   MCV 91.3 03/01/2019   PLT 154.0 03/01/2019    Lab Results  Component Value Date   NA 138 03/01/2019   K 4.0 03/01/2019   CL 102 03/01/2019   CO2 29 03/01/2019   BUN 16 03/01/2019   CREATININE 1.08 03/01/2019   GLUCOSE 108 (H)  03/01/2019   ALT 26 03/01/2019    Lab Results  Component Value Date   CHOL 80 03/01/2019   HDL 24.30 (L) 03/01/2019   LDLCALC 27 03/01/2019   TRIG 144.0 03/01/2019   CHOLHDL 3 03/01/2019    --------------------------------------------------------------------------------------------------  ASSESSMENT AND PLAN: Coronary artery disease with stable angina: Mr. Fearrington continues to do well without significant chest pain or shortness of breath on his antianginal regimen of bisoprolol and isosorbide mononitrate.  We will not make any medication changes today.  We will also continue with indefinite dual antiplatelet therapy.  Hypertension: Blood pressure borderline elevated today, though Mr. Hagle reports better readings prior to his knee surgery.  I suspect that pain related to his left knee replacement may be increasing his blood pressure.  I stressed the importance of sodium restriction.  We will defer medication changes today.  Hyperlipidemia: LDL well controlled.  Continue atorvastatin 20  mg daily.  Follow-up: Return to clinic in 6 months.  Nelva Bush, MD 06/20/2019 2:35 PM

## 2019-06-20 NOTE — Patient Instructions (Signed)
Medication Instructions:  Your physician recommends that you continue on your current medications as directed. Please refer to the Current Medication list given to you today. *If you need a refill on your cardiac medications before your next appointment, please call your pharmacy*  Lab Work: NONE If you have labs (blood work) drawn today and your tests are completely normal, you will receive your results only by: . MyChart Message (if you have MyChart) OR . A paper copy in the mail If you have any lab test that is abnormal or we need to change your treatment, we will call you to review the results.  Testing/Procedures: NONE  Follow-Up: At [facility], you and your health needs are our priority.  As part of our continuing mission to provide you with exceptional heart care, we have created designated Provider Care Teams.  These Care Teams include your primary Cardiologist (physician) and Advanced Practice Providers (APPs -  Physician Assistants and Nurse Practitioners) who all work together to provide you with the care you need, when you need it.  Your next appointment:   6 months  The format for your next appointment:   In Person  Provider:    You may see DR CHRISTOPHER END or one of the following Advanced Practice Providers on your designated Care Team:    Christopher Berge, NP  Ryan Dunn, PA-C  Jacquelyn Visser, PA-C   

## 2019-06-21 ENCOUNTER — Encounter: Payer: Self-pay | Admitting: Internal Medicine

## 2019-06-22 ENCOUNTER — Other Ambulatory Visit: Payer: Self-pay | Admitting: Internal Medicine

## 2019-06-22 DIAGNOSIS — Z96652 Presence of left artificial knee joint: Secondary | ICD-10-CM | POA: Diagnosis not present

## 2019-06-22 DIAGNOSIS — M25662 Stiffness of left knee, not elsewhere classified: Secondary | ICD-10-CM | POA: Diagnosis not present

## 2019-06-22 DIAGNOSIS — M25562 Pain in left knee: Secondary | ICD-10-CM | POA: Diagnosis not present

## 2019-06-22 DIAGNOSIS — G8929 Other chronic pain: Secondary | ICD-10-CM | POA: Diagnosis not present

## 2019-06-22 DIAGNOSIS — M6281 Muscle weakness (generalized): Secondary | ICD-10-CM | POA: Diagnosis not present

## 2019-06-25 DIAGNOSIS — Z96652 Presence of left artificial knee joint: Secondary | ICD-10-CM | POA: Diagnosis not present

## 2019-06-25 DIAGNOSIS — M25562 Pain in left knee: Secondary | ICD-10-CM | POA: Diagnosis not present

## 2019-06-25 DIAGNOSIS — M6281 Muscle weakness (generalized): Secondary | ICD-10-CM | POA: Diagnosis not present

## 2019-06-25 DIAGNOSIS — M25662 Stiffness of left knee, not elsewhere classified: Secondary | ICD-10-CM | POA: Diagnosis not present

## 2019-06-25 DIAGNOSIS — G8929 Other chronic pain: Secondary | ICD-10-CM | POA: Diagnosis not present

## 2019-06-27 ENCOUNTER — Ambulatory Visit (INDEPENDENT_AMBULATORY_CARE_PROVIDER_SITE_OTHER): Payer: Medicare Other

## 2019-06-27 DIAGNOSIS — G4733 Obstructive sleep apnea (adult) (pediatric): Secondary | ICD-10-CM | POA: Diagnosis not present

## 2019-06-27 DIAGNOSIS — Z96652 Presence of left artificial knee joint: Secondary | ICD-10-CM | POA: Diagnosis not present

## 2019-06-27 DIAGNOSIS — M25562 Pain in left knee: Secondary | ICD-10-CM | POA: Diagnosis not present

## 2019-06-27 DIAGNOSIS — G8929 Other chronic pain: Secondary | ICD-10-CM | POA: Diagnosis not present

## 2019-06-27 DIAGNOSIS — M6281 Muscle weakness (generalized): Secondary | ICD-10-CM | POA: Diagnosis not present

## 2019-06-27 DIAGNOSIS — M25662 Stiffness of left knee, not elsewhere classified: Secondary | ICD-10-CM | POA: Diagnosis not present

## 2019-06-27 NOTE — Progress Notes (Signed)
95 percentile pressure 10   95th percentile leak 17.5   apnea index 0.5 /hr  apnea-hypopnea index  1.6 /hr   total days used  >4 hr 89 days  total days used <4 hr 1 days  Total compliance 99 percent   He is doing great. No problems or questions at this time

## 2019-06-30 ENCOUNTER — Ambulatory Visit: Payer: Medicare Other

## 2019-07-03 ENCOUNTER — Ambulatory Visit: Payer: Medicare Other | Attending: Internal Medicine

## 2019-07-03 DIAGNOSIS — Z23 Encounter for immunization: Secondary | ICD-10-CM

## 2019-07-03 DIAGNOSIS — M25662 Stiffness of left knee, not elsewhere classified: Secondary | ICD-10-CM | POA: Diagnosis not present

## 2019-07-03 DIAGNOSIS — M25562 Pain in left knee: Secondary | ICD-10-CM | POA: Diagnosis not present

## 2019-07-03 DIAGNOSIS — Z96652 Presence of left artificial knee joint: Secondary | ICD-10-CM | POA: Diagnosis not present

## 2019-07-03 DIAGNOSIS — M6281 Muscle weakness (generalized): Secondary | ICD-10-CM | POA: Diagnosis not present

## 2019-07-03 DIAGNOSIS — G8929 Other chronic pain: Secondary | ICD-10-CM | POA: Diagnosis not present

## 2019-07-03 NOTE — Progress Notes (Signed)
   Covid-19 Vaccination Clinic  Name:  Tony Lawrence    MRN: [id number] DOB: [birth year]  07/03/2019  [name] was observed post Covid-19 immunization for 15 minutes without incident. He was provided with Vaccine Information Sheet and instruction to access the V-Safe system.   [name] was instructed to call 911 with any severe reactions post vaccine: Marland Kitchen Difficulty breathing  . Swelling of face and throat  . A fast heartbeat  . A bad rash all over body  . Dizziness and weakness   Immunizations Administered    Name Date Dose VIS Date Route   Pfizer COVID-19 Vaccine 07/03/2019  2:50 PM 0.3 mL 04/06/2019 Intramuscular   Manufacturer: [place]   Lot: [id number]   [place]: [id number]

## 2019-07-04 ENCOUNTER — Telehealth: Payer: Self-pay | Admitting: Physician Assistant

## 2019-07-04 ENCOUNTER — Other Ambulatory Visit: Payer: Self-pay | Admitting: Internal Medicine

## 2019-07-04 ENCOUNTER — Encounter: Payer: Self-pay | Admitting: Emergency Medicine

## 2019-07-04 ENCOUNTER — Ambulatory Visit
Admission: RE | Admit: 2019-07-04 | Discharge: 2019-07-04 | Disposition: A | Discharge status: Home / Self Care | Payer: Medicare Other | Source: Ambulatory Visit | Attending: Physician Assistant | Admitting: Physician Assistant

## 2019-07-04 ENCOUNTER — Emergency Department: Payer: Medicare Other

## 2019-07-04 ENCOUNTER — Emergency Department
Admission: EM | Admit: 2019-07-04 | Discharge: 2019-07-04 | Disposition: A | Discharge status: Home / Self Care | Payer: Medicare Other | Attending: Emergency Medicine | Admitting: Emergency Medicine

## 2019-07-04 ENCOUNTER — Other Ambulatory Visit: Payer: Self-pay

## 2019-07-04 DIAGNOSIS — I10 Essential (primary) hypertension: Secondary | ICD-10-CM | POA: Insufficient documentation

## 2019-07-04 DIAGNOSIS — Z96652 Presence of left artificial knee joint: Secondary | ICD-10-CM | POA: Diagnosis not present

## 2019-07-04 DIAGNOSIS — Z7984 Long term (current) use of oral hypoglycemic drugs: Secondary | ICD-10-CM | POA: Diagnosis not present

## 2019-07-04 DIAGNOSIS — E119 Type 2 diabetes mellitus without complications: Secondary | ICD-10-CM | POA: Insufficient documentation

## 2019-07-04 DIAGNOSIS — I82412 Acute embolism and thrombosis of left femoral vein: Secondary | ICD-10-CM | POA: Diagnosis not present

## 2019-07-04 DIAGNOSIS — Z79899 Other long term (current) drug therapy: Secondary | ICD-10-CM | POA: Insufficient documentation

## 2019-07-04 DIAGNOSIS — I2510 Atherosclerotic heart disease of native coronary artery without angina pectoris: Secondary | ICD-10-CM | POA: Diagnosis not present

## 2019-07-04 DIAGNOSIS — E039 Hypothyroidism, unspecified: Secondary | ICD-10-CM | POA: Insufficient documentation

## 2019-07-04 DIAGNOSIS — K5730 Diverticulosis of large intestine without perforation or abscess without bleeding: Secondary | ICD-10-CM | POA: Diagnosis not present

## 2019-07-04 DIAGNOSIS — Z85828 Personal history of other malignant neoplasm of skin: Secondary | ICD-10-CM | POA: Diagnosis not present

## 2019-07-04 DIAGNOSIS — R9389 Abnormal findings on diagnostic imaging of other specified body structures: Secondary | ICD-10-CM | POA: Diagnosis present

## 2019-07-04 DIAGNOSIS — Z85810 Personal history of malignant neoplasm of tongue: Secondary | ICD-10-CM | POA: Insufficient documentation

## 2019-07-04 DIAGNOSIS — R310 Gross hematuria: Secondary | ICD-10-CM | POA: Diagnosis not present

## 2019-07-04 DIAGNOSIS — Z7982 Long term (current) use of aspirin: Secondary | ICD-10-CM | POA: Insufficient documentation

## 2019-07-04 LAB — POCT I-STAT CREATININE: Creatinine, Ser: 1.1 mg/dL (ref 0.61–1.24)

## 2019-07-04 MED ORDER — APIXABAN 5 MG PO TABS: ORAL_TABLET | ORAL | 1 refills | Status: DC

## 2019-07-04 MED ORDER — IOHEXOL 300 MG/ML  SOLN
150.0000 mL | Freq: Once | INTRAMUSCULAR | Status: AC | PRN
  Administered 2019-07-04: 125 mL via INTRAVENOUS

## 2019-07-04 NOTE — ED Provider Notes (Signed)
[facility] Emergency Department Provider Note  ____________________________________________  Time seen: Approximately 8:02 PM  I have reviewed the triage vital signs and the nursing notes.   HISTORY  Chief Complaint DVT    HPI [name] is a 74 y.o. male with a history of CAD GERD hypertension and a recent left knee replacement 8 weeks ago who comes the ED due to incidentally found DVT in left common femoral vein on a CT scan obtained by urology.   Patient sent to the ED for further evaluation.  He also notes a recent car trip to [place] over the weekend.  Patient denies any pain or swelling in the left leg.  No chest pain or shortness of breath.  No other injuries.  No fevers or chills.     Past Medical History:  Diagnosis Date  . Allergy   . Arthritis    low back pain s/p shots and blocks, knee pain  . BCC (basal cell carcinoma of skin)    forehead and chest Dr. Evorn Gong q 6 months   . CAD (coronary artery disease)   . Complication of anesthesia    SEVERE SORE THROAT AFTER BIOPSY 2010  . Coronary artery disease    Chronic total occlusion of mid LAD  . Diabetes mellitus without complication ([place])   . GERD (gastroesophageal reflux disease)   . History of chicken pox   . History of shingles   . Hyperlipidemia   . Hypertension   . Hypothyroidism   . OSA on CPAP   . 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 Tami Ribas, [facility]      Patient Active Problem List   Diagnosis Date Noted  . Erectile dysfunction due to arterial insufficiency 04/24/2019  . Primary osteoarthritis of left knee 11/22/2018  . Gastroesophageal reflux disease without esophagitis 03/21/2018  . History of skin cancer 03/21/2018  . History of tongue cancer 03/21/2018  . OSA on CPAP 03/21/2018  . Diarrhea 03/21/2018  . Hypothyroidism 03/21/2018  . Chronic low back pain 03/14/2018  . Coronary artery disease of native artery of  native heart with stable angina pectoris ([place]) 12/22/2017  . Essential hypertension 12/22/2017  . Hyperlipidemia LDL goal <70 12/22/2017  . 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 Cancer of ventral surface of tongue ([place]) 11/04/2015  . Malignant neoplasm of tongue, tip and lateral border ([place]) 11/04/2015  . Primary osteoarthritis of both knees 05/13/2014  . Other intervertebral disc displacement, lumbar region 01/16/2014  . Neuritis or radiculitis due to rupture of lumbar intervertebral disc 01/16/2014  . Parapelvic renal cyst 12/18/2013  . Flank pain 11/30/2013  . Arthritis, degenerative 03/12/2013  . Diabetes mellitus ([place]) 03/12/2013  . Increased frequency of urination 03/12/2013  . Slowing of urinary stream 03/12/2013  . Urinary hesitancy 03/12/2013  . Benign prostatic hyperplasia with urinary obstruction 03/06/2012     Past Surgical History:  Procedure Laterality Date  . 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 CARDIAC CATHETERIZATION N/A 11/10/2015   Procedure: Left Heart Cath and Coronary Angiography;  Surgeon: Wellington Hampshire, MD;  Location: [place] [facility] LAB;  Service: Cardiovascular;  Laterality: N/A;  . COLONOSCOPY  05/2006  . COLONOSCOPY WITH PROPOFOL N/A 05/24/2017   Procedure: COLONOSCOPY WITH PROPOFOL;  Surgeon: Lollie Sails, MD;  Location: [facility] ENDOSCOPY;  Service: Endoscopy;  Laterality: N/A;  . EXCISION OF TONGUE LESION N/A 11/11/2015   Procedure:  EXCISION OF TONGUE LESION/ WITH FROZEN SECTION;  Surgeon: Beverly Gust, MD;  Location: [facility] ORS;  Service: ENT;  Laterality: N/A;  . REPLACEMENT TOTAL KNEE Left      Prior to Admission medications   Medication Sig Start Date End Date Taking? Authorizing Provider  apixaban (ELIQUIS) 5 MG TABS tablet Take 2 tablets (10mg ) twice daily for 7 days, then 1 tablet (5mg ) twice daily 07/04/19   Carrie Mew, MD  aspirin 81 MG tablet Take 81 mg by mouth daily.    [provider]  atorvastatin (LIPITOR) 20 MG tablet  Take 1 tablet (20 mg total) by mouth daily at 6 PM. At night 02/28/19   McLean-Scocuzza, Nino Glow, MD  bisoprolol-hydrochlorothiazide [facility]) 2.5-6.25 MG tablet Take 1 tablet by mouth daily. 02/28/19   McLean-Scocuzza, Nino Glow, MD  empagliflozin (JARDIANCE) 25 MG TABS tablet Take 25 mg by mouth daily. 08/16/18   McLean-Scocuzza, Nino Glow, MD  glimepiride (AMARYL) 1 MG tablet TAKE ONE TABLET BY MOUTH EVERY MORNING AND TAKE ONE TABLET BY MOUTH WITH SUPPER 02/28/19   McLean-Scocuzza, Nino Glow, MD  isosorbide mononitrate (IMDUR) 30 MG 24 hr tablet TAKE ONE TABLET BY MOUTH EVERY MORNING AND TWO TABLETS EVERY EVENING 06/22/19   End, Harrell Gave, MD  levothyroxine (SYNTHROID) 200 MCG tablet Take 1 tablet (200 mcg total) by mouth daily. In am on empty stomach 03/02/19   McLean-Scocuzza, Nino Glow, MD  liothyronine (CYTOMEL) 5 MCG tablet Take 0.5 tablets (2.5 mcg total) by mouth every other day. 03/02/19   McLean-Scocuzza, Nino Glow, MD  metFORMIN (GLUCOPHAGE) 1000 MG tablet Take 1 tablet (1,000 mg total) by mouth daily with breakfast. 03/14/18   McLean-Scocuzza, Nino Glow, MD  Multiple Vitamins-Minerals (CENTRUM SILVER 50+MEN PO) Take 1 tablet by mouth every morning.     [provider]  omeprazole (PRILOSEC) 40 MG capsule Take 1 capsule (40 mg total) by mouth daily. 02/28/19   McLean-Scocuzza, Nino Glow, MD  ramipril (ALTACE) 10 MG capsule Take 2 capsules (20 mg total) by mouth daily. 02/28/19   McLean-Scocuzza, Nino Glow, MD     Allergies Penicillins   Family History  Problem Relation Age of Onset  . Myelodysplastic syndrome Mother   . Arthritis Mother   . Emphysema Father   . Alcohol abuse Father   . COPD Father   . Depression Father   . Early death Maternal Grandfather   . Heart disease Maternal Grandfather   . Stroke Maternal Grandfather     Social History Social History   Tobacco Use  . Smoking status: Never Smoker  . Smokeless tobacco: Never Used  Substance Use Topics  . Alcohol use: Not  Currently    Alcohol/week: 0.0 standard drinks    Comment: occassionally   . Drug use: No    Review of Systems  Constitutional:   No fever or chills.  ENT:   No sore throat. No rhinorrhea. Cardiovascular:   No chest pain or syncope. Respiratory:   No dyspnea or cough. Gastrointestinal:   Negative for abdominal pain, vomiting and diarrhea.  Musculoskeletal:   Negative for focal pain or swelling All other systems reviewed and are negative except as documented above in ROS and HPI.  ____________________________________________   PHYSICAL EXAM:  VITAL SIGNS: ED Triage Vitals  Enc Vitals Group     BP 07/04/19 1633 (!) 161/71     Pulse Rate 07/04/19 1633 71     Resp 07/04/19 1633 18     Temp 07/04/19 1633 98.6 F (37  C)     Temp Source 07/04/19 1633 Oral     SpO2 07/04/19 1633 96 %     Weight 07/04/19 1631 245 lb (111.1 kg)     Height 07/04/19 1631 5\' 10"  (1.778 m)     Head Circumference --      Peak Flow --      Pain Score 07/04/19 1631 2     Pain Loc --      Pain Edu? --      Excl. in [place]? --     Vital signs reviewed, nursing assessments reviewed.   Constitutional:   Alert and oriented. Non-toxic appearance. Eyes:   Conjunctivae are normal. EOMI. PERRL. ENT      Head:   Normocephalic and atraumatic.      Nose:   Wearing a mask.      Mouth/Throat:   Wearing a mask.      Neck:   No meningismus. Full ROM. Hematological/Lymphatic/Immunilogical:   No cervical lymphadenopathy. Cardiovascular:   RRR. Symmetric bilateral radial and DP pulses.  No murmurs. Cap refill less than 2 seconds. Respiratory:   Normal respiratory effort without tachypnea/retractions. Breath sounds are clear and equal bilaterally. No wheezes/rales/rhonchi. Gastrointestinal:   Soft and nontender. Non distended. There is no CVA tenderness.  No rebound, rigidity, or guarding.  Musculoskeletal:   Normal range of motion in all extremities. No joint effusions.  No lower extremity tenderness.  No edema.  No  palpable cords.  Symmetric calf circumference. Neurologic:   Normal speech and language.  Motor grossly intact. No acute focal neurologic deficits are appreciated.  Skin:    Skin is warm, dry and intact. No rash noted.  No petechiae, purpura, or bullae.  ____________________________________________    LABS (pertinent positives/negatives) (all labs ordered are listed, but only abnormal results are displayed) Labs Reviewed - No data to display ____________________________________________   EKG    ____________________________________________    RADIOLOGY  US Venous Img Lower Unilateral Left  Result Date: 07/04/2019 CLINICAL DATA:  DVT noted on CT earlier today. EXAM: Left LOWER EXTREMITY VENOUS DOPPLER ULTRASOUND TECHNIQUE: Gray-scale sonography with compression, as well as color and duplex ultrasound, were performed to evaluate the deep venous system(s) from the level of the common femoral vein through the popliteal and proximal calf veins. COMPARISON:  CT earlier same day. FINDINGS: VENOUS Nonocclusive thrombosis present within the left common femoral vein extending to the saphenofemoral junction and femoral vein. Profundus is clear. Popliteal vein is clear. Lower leg veins are clear. Limited views of the contralateral common femoral vein are unremarkable. OTHER None. Limitations: none IMPRESSION: Positive for nonocclusive thrombus affecting the left common femoral vein, saphenofemoral junction and small segment of the femoral vein as shown by CT. No venous thrombosis more distally in the left lower extremity. Electronically Signed   By: Nelson Chimes M.D.   On: 07/04/2019 17:47   CT HEMATURIA WORKUP  Result Date: 07/04/2019 CLINICAL DATA:  Painless gross hematuria. Urinary frequency. EXAM: CT ABDOMEN AND PELVIS WITHOUT AND WITH CONTRAST TECHNIQUE: Multidetector CT imaging of the abdomen and pelvis was performed following the standard protocol before and following the bolus  administration of intravenous contrast. CONTRAST:  122mL OMNIPAQUE IOHEXOL 300 MG/ML  SOLN COMPARISON:  Abdomen only CT on 11/05/2009 FINDINGS: Lower Chest: No acute findings. Hepatobiliary: No hepatic masses identified. Pancreas:  No mass or inflammatory changes. Spleen: Within normal limits in size and appearance. 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 No evidence  of urolithiasis or hydronephrosis. 5 cm benign right renal sinus cyst again noted. No complex cystic or solid renal masses identified. No masses seen involving the ureters or bladder. Stomach/Bowel: No evidence of obstruction, inflammatory process or abnormal fluid collections. Normal appendix visualized. Diverticulosis is seen mainly involving the sigmoid colon, however there is no evidence of diverticulitis. Vascular/Lymphatic: No pathologically enlarged lymph nodes. No abdominal aortic aneurysm. Aortic atherosclerosis incidentally noted. Nonocclusive DVT is seen within the left common femoral vein. No other pelvic DVT or IVC thrombus seen. Reproductive:  Mildly enlarged prostate. Other:  None. Musculoskeletal:  No suspicious bone lesions identified. IMPRESSION: 1. No radiographic evidence of urinary tract neoplasm, urolithiasis, or hydronephrosis. 2. Mildly enlarged prostate. 3. Colonic diverticulosis. No radiographic evidence of diverticulitis. 4. Nonocclusive DVT in the left common femoral vein. These results were called by telephone at the time of interpretation on 07/04/2019 at 2:18 pm to provider Debroah Loop , who verbally acknowledged these results. Electronically Signed   By: Marlaine Hind M.D.   On: 07/04/2019 14:27    ____________________________________________   PROCEDURES Procedures  ____________________________________________    CLINICAL IMPRESSION / ASSESSMENT AND PLAN / ED COURSE  Medications ordered in the ED: Medications - No data to display  Pertinent labs  & imaging results that were available during my care of the patient were reviewed by me and considered in my medical decision making (see chart for details).  ROSCOE MILETO was evaluated in Emergency Department on 07/04/2019 for the symptoms described in the history of present illness. He was evaluated in the context of the global COVID-19 pandemic, which necessitated consideration that the patient might be at risk for infection with the SARS-CoV-2 virus that causes COVID-19. Institutional protocols and algorithms that pertain to the evaluation of patients at risk for COVID-19 are in a state of rapid change based on information released by regulatory bodies including the CDC and federal and state organizations. These policies and algorithms were followed during the patient's care in the ED.   Patient presents with incidentally DVT of left common femoral vein.  Ultrasound obtained which confirms nonocclusive clot, extending to the saphenofemoral junction.  No evidence of VTE or PE.  Discussed with Dr. Lucky Cowboy of vascular surgery who advises continue aspirin, hold Plavix, start Eliquis.  Appears to be a provoked DVT, patient's last PCI was 3-1/2 years ago according to patient, and did not have a stent at that time even.  Electronic medical record reviewed by me, including left heart cath report from July 2017 confirming the same.  He is on aspirin and Plavix for medical therapy of CAD.     ____________________________________________   FINAL CLINICAL IMPRESSION(S) / ED DIAGNOSES    Final diagnoses:  Deep vein thrombosis (DVT) of femoral vein of left lower extremity, unspecified chronicity [facility])     ED Discharge Orders         Ordered    apixaban (ELIQUIS) 5 MG TABS tablet     07/04/19 2000          Portions of this note were generated with dragon dictation software. Dictation errors may occur despite best attempts at proofreading.   Carrie Mew, MD 07/04/19 2005

## 2019-07-04 NOTE — Discharge Instructions (Signed)
The ultrasound shows a blood clot in your left leg.  You can continue the daily aspirin, but you should stop taking Plavix and start taking Eliquis as prescribed.  Follow-up with your doctor or vascular surgery in the next few weeks for continued monitoring of your symptoms and anticoagulation therapy.

## 2019-07-04 NOTE — Telephone Encounter (Signed)
Patient underwent CT urogram today for evaluation of gross hematuria.  I received a call from radiology with reports of an incidental finding of a left common femoral vein DVT.  Patient is s/p left knee replacement and on aspirin and Plavix.  Upon consultation with his PCP, Dr. Terese Door, I contacted the patient via telephone this afternoon to report the results of this incidental finding and advised him to proceed to the emergency department for anticoagulation today.  He expressed understanding.  I contacted the ED with the patient's information in anticipation of his arrival.

## 2019-07-04 NOTE — ED Triage Notes (Signed)
Pt here for DVT. Pt had CT for urological reasons today and it picked up a DVT to left fem vein that is nonocclusive.  Pt had knee replacement this side 8 weeks ago and has had swelling and discomfort since then.  Very minimal increase in swelling per pt since the weekend. He did travel to [place] over last weekend.  Ambulatory.

## 2019-07-05 ENCOUNTER — Telehealth: Payer: Self-pay | Admitting: Internal Medicine

## 2019-07-05 DIAGNOSIS — I82409 Acute embolism and thrombosis of unspecified deep veins of unspecified lower extremity: Secondary | ICD-10-CM | POA: Insufficient documentation

## 2019-07-05 NOTE — Telephone Encounter (Signed)
[facility] [employer]   [address]   [id number]   [place], [place] [address]   [place], [place], [place]   [place] [id number]   [place], [place] [address]   [phone]   [phone] (Fax)      This is for [facility] to manage and know about inform patient to follow up with them and call then and let them know we will fax all reports    He will likely need to be on blood thinners for 6 months   Fax CT renal from 07/04/19 to [facility]  PT fax in note to follow

## 2019-07-05 NOTE — Telephone Encounter (Signed)
07/03/2019 PT/OT Office Visit [facility]  [place], [place] [address]  [phone]  Jabier Gauss, [place]  [place], [place] [address]  [phone] (Fax)  S/P TKR (total knee replacement), left (Primary Dx);  Chronic pain of left knee;  Knee stiff, left;  Generalized muscle weakness   Fax CT to PT as well and ortho needs to advise about PT from here place this on cover to ortho and PT

## 2019-07-05 NOTE — Telephone Encounter (Signed)
Patient states he had a CT yesterday with his urologist yesterday, and they discovered DVT. States his urologist, Dr. Bernardo Heater advised him to go to the ED, where they performed an ultrasound. His blood thinner was changed from Plavix to Eliquis. Please call to discuss.

## 2019-07-05 NOTE — Telephone Encounter (Signed)
Routed to Dr. Saunders Revel to review.

## 2019-07-05 NOTE — Telephone Encounter (Signed)
Please advise 

## 2019-07-05 NOTE — Telephone Encounter (Signed)
Patient found out yesterday that he has a blood clot in his left upper leg where he just  had knee replacement. Patient would like to know if he should continue physical therapy. Patient was also advise to call the provider that order physical therapy for his knee to let him know about blood clot.

## 2019-07-05 NOTE — Telephone Encounter (Signed)
Faxed. Patient informed and verbalized understanding.  He will contact ortho and PT

## 2019-07-05 NOTE — Telephone Encounter (Signed)
McLean-Scocuzza, Nino Glow, MD 1 minute ago (4:57 PM)  TM   07/03/2019 PT/OT Office Visit [facility]  [place], [place] [address]  [phone]  Jabier Gauss, [address]  [place], [place] [address]  [phone]  [phone] (Fax)  S/P TKR (total knee replacement), left (Primary Dx);  Chronic pain of left knee;  Knee stiff, left;  Generalized muscle weakness   Fax CT to PT as well and ortho needs to advise about PT from here place this on cover to ortho and PT

## 2019-07-05 NOTE — Telephone Encounter (Signed)
Message added to previous encounter.  °

## 2019-07-05 NOTE — Telephone Encounter (Signed)
I spoke with the patient and let him know that Dr End was also in agreement with him stopping the Plavix and taking Eliquis instead. The patient stated that he is going to start taking the Eliquis tonight. I also gave him Dr Marisue Humble recommendation to follow up with his PCP for ongoing treatment of the provoked DVT.  I told him to please call back with any other questions or concerns.

## 2019-07-05 NOTE — Telephone Encounter (Signed)
Thank you for the update.  I am in agreement with holding clopidogrel and treating acute DVT in the setting of recent knee replacement with apixaban, as prescribed by the ED.  I will defer ongoing treatment of provoked DVT to Mr. Brensinger PCP.  Once he has completed his course of apixaban, we will readdress going back on clopidogrel.  Nelva Bush, MD [facility]

## 2019-07-06 ENCOUNTER — Ambulatory Visit (INDEPENDENT_AMBULATORY_CARE_PROVIDER_SITE_OTHER): Payer: Medicare Other | Admitting: Urology

## 2019-07-06 ENCOUNTER — Other Ambulatory Visit: Payer: Self-pay | Admitting: Internal Medicine

## 2019-07-06 ENCOUNTER — Other Ambulatory Visit: Payer: Self-pay

## 2019-07-06 ENCOUNTER — Encounter: Payer: Self-pay | Admitting: Urology

## 2019-07-06 VITALS — BP 159/75 | HR 76 | Ht 72.0 in | Wt 245.0 lb

## 2019-07-06 DIAGNOSIS — R310 Gross hematuria: Secondary | ICD-10-CM

## 2019-07-06 DIAGNOSIS — E119 Type 2 diabetes mellitus without complications: Secondary | ICD-10-CM

## 2019-07-06 DIAGNOSIS — B349 Viral infection, unspecified: Secondary | ICD-10-CM | POA: Diagnosis not present

## 2019-07-06 DIAGNOSIS — R3129 Other microscopic hematuria: Secondary | ICD-10-CM | POA: Diagnosis not present

## 2019-07-06 LAB — URINALYSIS, COMPLETE
Bilirubin, UA: NEGATIVE
Ketones, UA: NEGATIVE
Leukocytes,UA: NEGATIVE
Nitrite, UA: NEGATIVE
Protein,UA: NEGATIVE
RBC, UA: NEGATIVE
Specific Gravity, UA: <1.005 — ABNORMAL LOW (ref 1.005–1.030)
Urobilinogen, Ur: 0.2 mg/dL (ref 0.2–1.0)
pH, UA: 5 (ref 5.0–7.5)

## 2019-07-06 LAB — MICROSCOPIC EXAMINATION
Bacteria, UA: NONE SEEN
RBC, Urine: NONE SEEN /hpf (ref 0–2)
WBC, UA: NONE SEEN /hpf (ref 0–5)

## 2019-07-06 MED ORDER — METFORMIN HCL 1000 MG PO TABS: 1000.0000 mg | ORAL_TABLET | Freq: Every day | ORAL | 3 refills | Status: DC

## 2019-07-06 NOTE — Progress Notes (Signed)
   07/06/19  CC:  Chief Complaint  Patient presents with  . Cysto    HPI: 74 y.o. male recently seen by Thomas Hoff for total gross painless hematuria.  Denies recurrent hematuria.  CTU with no upper tract abnormalities.  5 cm right parapelvic cyst.  No solid masses or collecting system/ureteral abnormalities  Blood pressure (!) 159/75, pulse 76, height 6' (1.829 m), weight 245 lb (111.1 kg). NED. A&Ox3.     Cystoscopy Procedure Note  Patient identification was confirmed, informed consent was obtained, and patient was prepped using Betadine solution.  Lidocaine jelly was administered per urethral meatus.     Pre-Procedure: - Inspection reveals a normal caliber urethral meatus.  Procedure: The flexible cystoscope was introduced without difficulty - No urethral strictures/lesions are present. - Moderate lateral lobe enlargement with hypervascularity/friability prostate  - Mild elevation bladder neck - Bilateral ureteral orifices identified - Bladder mucosa  reveals no ulcers, tumors, or lesions - No bladder stones -Mild trabeculation  Retroflexion shows no intravesical median lobe.  Backbleeding from prostate noted   Post-Procedure: - Patient tolerated the procedure well  Assessment/ Plan: -No mucosal abnormalities on cystoscopy -Hematuria most likely prostatic in origin -Urine cytology sent -Keep scheduled follow-up, call earlier for recurrent hematuria   Abbie Sons, MD

## 2019-07-08 ENCOUNTER — Encounter: Payer: Self-pay | Admitting: Urology

## 2019-07-09 ENCOUNTER — Telehealth (INDEPENDENT_AMBULATORY_CARE_PROVIDER_SITE_OTHER): Payer: Self-pay

## 2019-07-09 NOTE — Telephone Encounter (Signed)
Patient has been aware with medical advice and verbalized understanding

## 2019-07-10 ENCOUNTER — Telehealth: Payer: Self-pay | Admitting: Internal Medicine

## 2019-07-10 DIAGNOSIS — M25662 Stiffness of left knee, not elsewhere classified: Secondary | ICD-10-CM | POA: Diagnosis not present

## 2019-07-10 DIAGNOSIS — M6281 Muscle weakness (generalized): Secondary | ICD-10-CM | POA: Diagnosis not present

## 2019-07-10 DIAGNOSIS — Z96652 Presence of left artificial knee joint: Secondary | ICD-10-CM | POA: Diagnosis not present

## 2019-07-10 DIAGNOSIS — G8929 Other chronic pain: Secondary | ICD-10-CM | POA: Diagnosis not present

## 2019-07-10 DIAGNOSIS — M25562 Pain in left knee: Secondary | ICD-10-CM | POA: Diagnosis not present

## 2019-07-10 NOTE — Telephone Encounter (Signed)
Pt needs a hospital follow up visit for DVT. The hospital wants it done this week. There are no appts available

## 2019-07-10 NOTE — Telephone Encounter (Signed)
Please advise 

## 2019-07-11 ENCOUNTER — Other Ambulatory Visit: Payer: Self-pay | Admitting: Urology

## 2019-07-11 NOTE — Telephone Encounter (Signed)
Can try to work in at 8:15 or 1:15 one day w/in the next week   [place]

## 2019-07-11 NOTE — Telephone Encounter (Signed)
Patient scheduled for Wednesday 3/24 at 10 am.

## 2019-07-13 ENCOUNTER — Telehealth: Payer: Self-pay | Admitting: Urology

## 2019-07-13 NOTE — Telephone Encounter (Signed)
Urine cytology showed no abnormal cells 

## 2019-07-16 ENCOUNTER — Other Ambulatory Visit: Payer: Self-pay

## 2019-07-16 NOTE — Telephone Encounter (Signed)
Notified patient as instructed, patient pleased. Discussed follow-up appointments, patient agrees  

## 2019-07-18 ENCOUNTER — Ambulatory Visit (INDEPENDENT_AMBULATORY_CARE_PROVIDER_SITE_OTHER): Payer: Medicare Other | Admitting: Internal Medicine

## 2019-07-18 ENCOUNTER — Encounter: Payer: Self-pay | Admitting: Internal Medicine

## 2019-07-18 ENCOUNTER — Other Ambulatory Visit: Payer: Self-pay

## 2019-07-18 ENCOUNTER — Telehealth: Payer: Self-pay | Admitting: Internal Medicine

## 2019-07-18 VITALS — BP 122/70 | HR 64 | Temp 97.0°F | Ht 72.0 in | Wt 247.2 lb

## 2019-07-18 DIAGNOSIS — I82412 Acute embolism and thrombosis of left femoral vein: Secondary | ICD-10-CM | POA: Diagnosis not present

## 2019-07-18 DIAGNOSIS — E039 Hypothyroidism, unspecified: Secondary | ICD-10-CM

## 2019-07-18 DIAGNOSIS — E119 Type 2 diabetes mellitus without complications: Secondary | ICD-10-CM | POA: Diagnosis not present

## 2019-07-18 MED ORDER — APIXABAN 5 MG PO TABS: ORAL_TABLET | ORAL | 1 refills | Status: DC

## 2019-07-18 MED ORDER — ONETOUCH ULTRASOFT LANCETS MISC: 12 refills | Status: DC

## 2019-07-18 MED ORDER — JARDIANCE 25 MG PO TABS: 25.0000 mg | ORAL_TABLET | Freq: Every day | ORAL | 3 refills | Status: DC

## 2019-07-18 MED ORDER — GLUCOSE BLOOD VI STRP: ORAL_STRIP | 12 refills | Status: DC

## 2019-07-18 NOTE — Patient Instructions (Addendum)

## 2019-07-18 NOTE — Progress Notes (Signed)
Chief Complaint  Patient presents with  . Hospitalization Follow-up  . DVT   F/u  1. DVT left common femoral 07/04/19 noted on CT renal no FH blood clots he just had knee replacement 2 months prior to DVT and then took 10 hr car ride to [place] he was on plavix and aspirin 81 mg and took 162 mg for 30 days after knee surgery and was not on 162 mg aspirin at the time of car trip to [place]  He is having left medial thigh pain mild to moderate   2. DM 2 cbg 07/17/19 86 checks 1x per week on jardiance 25 amaryl 1 mg metformin   Review of Systems  Constitutional: Negative for weight loss.  HENT: Negative for hearing loss.   Eyes: Negative for blurred vision.  Respiratory: Negative for shortness of breath.   Cardiovascular: Negative for chest pain.  Musculoskeletal: Negative for joint pain.       B/l knee pain sp left TKR  Skin: Negative for rash.   Past Medical History:  Diagnosis Date  . Allergy   . Arthritis    low back pain s/p shots and blocks, knee pain  . BCC (basal cell carcinoma of skin)    forehead and chest Dr. Evorn Gong q 6 months   . CAD (coronary artery disease)   . Complication of anesthesia    SEVERE SORE THROAT AFTER BIOPSY 2010  . Coronary artery disease    Chronic total occlusion of mid LAD  . Diabetes mellitus without complication ([place])   . GERD (gastroesophageal reflux disease)   . History of chicken pox   . History of shingles   . Hyperlipidemia   . Hypertension   . Hypothyroidism   . OSA on CPAP   . 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 Tami Ribas, [facility] H/o    Past Surgical History:  Procedure Laterality Date  . 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 CARDIAC CATHETERIZATION N/A 11/10/2015   Procedure: Left Heart Cath and Coronary Angiography;  Surgeon: Wellington [place], MD;  Location: [facility];  Service: Cardiovascular;  Laterality: N/A;  . COLONOSCOPY  05/2006  . COLONOSCOPY WITH PROPOFOL N/A  05/24/2017   Procedure: COLONOSCOPY WITH PROPOFOL;  Surgeon: Lollie Sails, MD;  Location: [facility] ENDOSCOPY;  Service: Endoscopy;  Laterality: N/A;  . EXCISION OF TONGUE LESION N/A 11/11/2015   Procedure: EXCISION OF TONGUE LESION/ WITH FROZEN SECTION;  Surgeon: Beverly Gust, MD;  Location: [facility] ORS;  Service: ENT;  Laterality: N/A;  . REPLACEMENT TOTAL KNEE Left    Family History  Problem Relation Age of Onset  . Myelodysplastic syndrome Mother   . Arthritis Mother   . Emphysema Father   . Alcohol abuse Father   . COPD Father   . Depression Father   . Early death Maternal Grandfather   . Heart disease Maternal Grandfather   . Stroke Maternal Grandfather    Social History   Socioeconomic History  . Marital status: Married    Spouse name: Not on file  . Number of children: Not on file  . Years of education: Not on file  . Highest education level: Not on file  Occupational History  . Not on file  Tobacco Use  . Smoking status: Never Smoker  . Smokeless tobacco: Never Used  Substance and Sexual Activity  . Alcohol use: Not Currently    Alcohol/week:  0.0 standard drinks    Comment: occassionally   . Drug use: No  . Sexual activity: Yes  Other Topics Concern  . 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 Difficulty of Paying Living Expenses: Not hard at all  Food Insecurity: No Food Insecurity  . Worried About Charity fundraiser in the Last Year: Never true  . Ran Out of Food in the Last Year: Never true  Transportation Needs: No Transportation Needs  . Lack of Transportation (Medical): No  . Lack of Transportation (Non-Medical): No  Physical Activity: Unknown  . Days of Exercise per Week: 0 days  . Minutes of Exercise per Session: Not on file  Stress: No Stress  Concern Present  . Feeling of Stress : Not at all  Social Connections:   . Frequency of Communication with Friends and Family:   . Frequency of Social Gatherings with Friends and Family:   . Attends Religious Services:   . Active Member of Clubs or Organizations:   . Attends Archivist Meetings:   Marland Kitchen Marital Status:   Intimate Partner Violence: Not At Risk  . Fear of Current or Ex-Partner: No  . Emotionally Abused: No  . Physically Abused: No  . Sexually Abused: No   Current Meds  Medication Sig  . Acetaminophen (TYLENOL PO) Take by mouth.  Marland Kitchen apixaban (ELIQUIS) 5 MG TABS tablet (5mg ) twice daily  . aspirin 81 MG tablet Take 81 mg by mouth daily.  Marland Kitchen atorvastatin (LIPITOR) 20 MG tablet Take 1 tablet (20 mg total) by mouth daily at 6 PM. At night  . bisoprolol-hydrochlorothiazide (ZIAC) 2.5-6.25 MG tablet Take 1 tablet by mouth daily.  . empagliflozin (JARDIANCE) 25 MG TABS tablet Take 25 mg by mouth daily.  Marland Kitchen glimepiride (AMARYL) 1 MG tablet TAKE ONE TABLET BY MOUTH EVERY MORNING AND TAKE ONE TABLET BY MOUTH WITH SUPPER  . isosorbide mononitrate (IMDUR) 30 MG 24 hr tablet TAKE ONE TABLET BY MOUTH EVERY MORNING AND TWO TABLETS EVERY EVENING  . levothyroxine (SYNTHROID) 200 MCG tablet Take 1 tablet (200 mcg total) by mouth daily. In am on empty stomach  . liothyronine (CYTOMEL) 5 MCG tablet Take 0.5 tablets (2.5 mcg total) by mouth every other day.  . metFORMIN (GLUCOPHAGE) 1000 MG tablet Take 1 tablet (1,000 mg total) by mouth daily with breakfast.  . Multiple Vitamins-Minerals (CENTRUM SILVER 50+MEN PO) Take 1 tablet by mouth every morning.   Marland Kitchen omeprazole (PRILOSEC) 40 MG capsule Take 1 capsule (40 mg total) by mouth daily.  . ramipril (ALTACE) 10 MG capsule Take 2 capsules (20 mg total) by mouth daily.  . TRAMADOL HCL PO Take by mouth at bedtime.  . [DISCONTINUED] apixaban (ELIQUIS) 5 MG TABS tablet Take 2 tablets (10mg ) twice daily for 7 days, then 1 tablet (5mg ) twice daily   . [DISCONTINUED] apixaban (ELIQUIS) 5 MG TABS tablet Take 2 tablets (10mg ) twice daily for 7 days, then 1 tablet (5mg ) twice daily  . [DISCONTINUED] empagliflozin (JARDIANCE) 25 MG TABS tablet Take 25 mg by mouth daily.   Allergies  Allergen Reactions  . Penicillins Hives, Rash, Other (See Comments) and Swelling    Has patient had a PCN reaction causing immediate rash, facial/tongue/throat swelling, SOB  or lightheadedness with hypotension: Yes Has patient had a PCN reaction causing severe rash involving mucus membranes or skin necrosis: No Has patient had a PCN reaction that required hospitalization No Has patient had a PCN reaction occurring within the last 10 years: No If all of the above answers are "NO", then may proceed with Cephalosporin use.    Recent Results (from the past 2160 hour(s))  Urinalysis, Complete     Status: Abnormal   Collection Time: 06/08/19  2:53 PM  Result Value Ref Range   Specific Gravity, UA 1.015 1.005 - 1.030   pH, UA 5.0 5.0 - 7.5   Color, UA Yellow Yellow   Appearance Ur Clear Clear   Leukocytes,UA Negative Negative   Protein,UA Negative Negative/Trace   Glucose, UA 2+ (A) Negative   Ketones, UA Negative Negative   RBC, UA Trace (A) Negative   Bilirubin, UA Negative Negative   Urobilinogen, Ur 0.2 0.2 - 1.0 mg/dL   Nitrite, UA Negative Negative   Microscopic Examination See below:   Microscopic Examination     Status: None   Collection Time: 06/08/19  2:53 PM   URINE  Result Value Ref Range   WBC, UA 0-5 0 - 5 /hpf   RBC 0-2 0 - 2 /hpf   Epithelial Cells (non renal) 0-10 0 - 10 /hpf   Bacteria, UA None seen None seen/Few  I-STAT creatinine     Status: None   Collection Time: 07/04/19 11:34 AM  Result Value Ref Range   Creatinine, Ser 1.10 0.61 - 1.24 mg/dL  Urinalysis, Complete     Status: Abnormal   Collection Time: 07/06/19  9:55 AM  Result Value Ref Range   Specific Gravity, UA <1.005 (L) 1.005 - 1.030   pH, UA 5.0 5.0 - 7.5   Color,  UA Yellow Yellow   Appearance Ur Clear Clear   Leukocytes,UA Negative Negative   Protein,UA Negative Negative/Trace   Glucose, UA 2+ (A) Negative   Ketones, UA Negative Negative   RBC, UA Negative Negative   Bilirubin, UA Negative Negative   Urobilinogen, Ur 0.2 0.2 - 1.0 mg/dL   Nitrite, UA Negative Negative   Microscopic Examination See below:   Microscopic Examination     Status: None   Collection Time: 07/06/19  9:55 AM   URINE  Result Value Ref Range   WBC, UA None seen 0 - 5 /hpf   RBC None seen 0 - 2 /hpf   Epithelial Cells (non renal) 0-10 0 - 10 /hpf   Bacteria, UA None seen None seen/Few   Objective  Body mass index is 33.53 kg/m. Wt Readings from Last 3 Encounters:  07/18/19 247 lb 3.2 oz (112.1 kg)  07/06/19 245 lb (111.1 kg)  07/04/19 245 lb (111.1 kg)   Temp Readings from Last 3 Encounters:  07/18/19 (!) 97 F (36.1 C) (Temporal)  07/04/19 98.6 F (37 C) (Oral)  06/08/19 98.3 F (36.8 C)   BP Readings from Last 3 Encounters:  07/18/19 122/70  07/06/19 (!) 159/75  07/04/19 (!) 158/81   Pulse Readings from Last 3 Encounters:  07/18/19 64  07/06/19 76  07/04/19 68    Physical Exam Vitals and nursing note reviewed.  Constitutional:      Appearance: Normal appearance. He is obese.  HENT:     Head: Normocephalic and atraumatic.  Eyes:     Conjunctiva/sclera: Conjunctivae normal.     Pupils: Pupils are equal, round, and reactive to light.  Cardiovascular:  Rate and Rhythm: Normal rate and regular rhythm.     Heart sounds: No murmur.  Pulmonary:     Effort: Pulmonary effort is normal.     Breath sounds: Normal breath sounds.  Skin:    General: Skin is warm and dry.  Neurological:     General: No focal deficit present.     Mental Status: He is alert and oriented to person, place, and time. Mental status is at baseline.  Psychiatric:        Mood and Affect: Mood normal.        Behavior: Behavior normal.        Thought Content: Thought  content normal.        Judgment: Judgment normal.     Assessment  Plan  Acute deep vein thrombosis (DVT) of femoral vein of left lower extremity (HCC) - Plan: US Venous Img Lower Unilateral Left, apixaban (ELIQUIS) 5 MG TABS tablet bid x 3-6 month  Consult vascular to see if further recs  [place] left leg in 3 months   Type 2 diabetes mellitus without complication, without long-term current use of insulin (HCC) - Plan: empagliflozin (JARDIANCE) 25 MG TABS tablet, glucose blood test strip, Lancets (ONETOUCH ULTRASOFT) lancets  Cont meds  Fasting labs 08/2019   HM Flu shot 01/2019 had low dose vaccine prevnar had 02/03/18  Tdaphad 08/02/2018 zostervax per pt had in the past Had 2/2 shigrix  pna 23 utd  covid 19 vx 2/2   Colonoscopy 05/24/2017 diverticulosis no etiology for diarrhea  -repeat in 10 years Dr. Gustavo Lah  PSA 1.2 01/11/18 normal f/u urology Dr. Bernardo Heater, PSA 0.9 05/10/14 -appt 03/2019 Dr. Bernardo Heater  -appt 07/04/19 [facility]   Dr. Sharlet [place]ina  Derm Dr. Evorn Gong h/o [place] forehead and chest saw 9 or 01/2019 ln2 to scalp still unresolved as of 02/28/2019 rec call back for more ln2 before 1 year  Derm 05/2019 ln2 to scalp f/u in 6 months  Residual scalp Aks x 2 f/u dermatology   Urology Dr. Bernardo Heater   ENT Dr. Bernestine Amass [facility]-will f/u prnQ3 months appt 4 or 08/2019   [place] eye Dr. Merla Riches  Dentist Dr. Eugenie Birks  Cardiology Dr. Saunders Revel  GI Dr. Gustavo Lah   Provider: Dr. Olivia Mackie McLean-Scocuzza-Internal Medicine

## 2019-07-18 NOTE — Telephone Encounter (Signed)
Per Dr. Lucky Cowboy vascular   Probably no further work up at this point. As long as long not really swollen, may have pain for a while in the area of the blood clot   [place]

## 2019-07-18 NOTE — Telephone Encounter (Signed)
[facility] said they can't fill patient's prescription for test strips and lancets with "use as instructed" as the directions. She said they need to know how many times a day the pt is testing. They are asking for a call back.

## 2019-07-18 NOTE — Telephone Encounter (Signed)
How often should the patient check?

## 2019-07-18 NOTE — Telephone Encounter (Signed)
Medication sent in to preferred pharmacy per protocol.   

## 2019-07-18 NOTE — Telephone Encounter (Signed)
Patient informed and verbalized understanding

## 2019-07-18 NOTE — Telephone Encounter (Signed)
Qd=daily its on Rx   Inform pharmacy  [place]

## 2019-07-18 NOTE — Telephone Encounter (Signed)
-----   Message from Algernon Huxley, MD sent at 07/18/2019 11:53 AM EDT ----- Probably no further work up at this point.  As long as long not really swollen, may have pain for a while.  ----- Message ----- From: McLean-Scocuzza, Nino Glow, MD Sent: 07/18/2019  10:47 AM EDT To: Algernon Huxley, MD  My patient has Nonocclusive DVT in the left common femoral vein and having left medial thigh pain on eliquis  Any further work up or should I be concerned.?

## 2019-07-20 ENCOUNTER — Telehealth: Payer: Self-pay | Admitting: Internal Medicine

## 2019-07-20 DIAGNOSIS — E119 Type 2 diabetes mellitus without complications: Secondary | ICD-10-CM

## 2019-07-20 MED ORDER — LANCET DEVICE MISC: 1.0000 | 0 refills | Status: DC

## 2019-07-20 NOTE — Telephone Encounter (Signed)
Py needing lancing device to match lancets. Sent

## 2019-07-20 NOTE — Telephone Encounter (Signed)
Pt states that his new machine needs a new rx for lancets [facility]

## 2019-07-26 ENCOUNTER — Ambulatory Visit: Payer: Medicare Other | Admitting: Internal Medicine

## 2019-08-21 ENCOUNTER — Telehealth: Payer: Self-pay | Admitting: Internal Medicine

## 2019-08-21 NOTE — Telephone Encounter (Signed)
Typical treatment duration for provoked DVT is 3-6 months of anticoagulation.  I will let vascular surgery and/or PCP determine exact length of treatment.  While he is on Eliquis, I agree with holding clopidogrel to minimize the risk of bleeding.  I would not interrupt anticoagulation for non-emergent procedures (including dental work) until DVT has been adequately treated.  Nelva Bush, MD [facility]

## 2019-08-21 NOTE — Telephone Encounter (Signed)
Patient notified of Dr End's recommendations and verbalized understanding.  

## 2019-08-21 NOTE — Telephone Encounter (Signed)
Patient calling in with questions to discuss with Dr. Saunders Revel  1) In January patient had knee replacement,. Since then patient had developed DVT (Dr.Dew changed blood thinner at that time to Eliquis). Dr. Saunders Revel was notified but now patient is wanting to know if he should continue on eliquis or return to Plavix? Patient has not had a f/u ultrasound of DVT, one is scheduled for July  2) Patient recently had a dental problem. Patient has been restricted from dental work from orthopaedist for 3 months but is now able to schedule. Patient might need extraction and would like to discuss stopping blood thinner. Dental office will not schedule till patient is advised  Please advise

## 2019-08-22 DIAGNOSIS — Z85810 Personal history of malignant neoplasm of tongue: Secondary | ICD-10-CM | POA: Diagnosis not present

## 2019-09-12 ENCOUNTER — Other Ambulatory Visit (INDEPENDENT_AMBULATORY_CARE_PROVIDER_SITE_OTHER): Payer: Medicare Other

## 2019-09-12 ENCOUNTER — Other Ambulatory Visit: Payer: Self-pay

## 2019-09-12 DIAGNOSIS — E039 Hypothyroidism, unspecified: Secondary | ICD-10-CM | POA: Diagnosis not present

## 2019-09-12 DIAGNOSIS — I82412 Acute embolism and thrombosis of left femoral vein: Secondary | ICD-10-CM | POA: Diagnosis not present

## 2019-09-12 DIAGNOSIS — E119 Type 2 diabetes mellitus without complications: Secondary | ICD-10-CM

## 2019-09-12 LAB — COMPREHENSIVE METABOLIC PANEL
ALT: 19 U/L (ref 0–53)
AST: 21 U/L (ref 0–37)
Albumin: 4.1 g/dL (ref 3.5–5.2)
Alkaline Phosphatase: 75 U/L (ref 39–117)
BUN: 13 mg/dL (ref 6–23)
CO2: 27 mEq/L (ref 19–32)
Calcium: 9 mg/dL (ref 8.4–10.5)
Chloride: 103 mEq/L (ref 96–112)
Creatinine, Ser: 1.06 mg/dL (ref 0.40–1.50)
GFR: 68.24 mL/min (ref >60.00)
Glucose, Bld: 116 mg/dL — ABNORMAL HIGH (ref 70–99)
Potassium: 4.1 mEq/L (ref 3.5–5.1)
Sodium: 138 mEq/L (ref 135–145)
Total Bilirubin: 0.6 mg/dL (ref 0.2–1.2)
Total Protein: 7 g/dL (ref 6.0–8.3)

## 2019-09-12 LAB — CBC WITH DIFFERENTIAL/PLATELET
Basophils Absolute: 0.1 10*3/uL (ref 0.0–0.1)
Basophils Relative: 0.9 % (ref 0.0–3.0)
Eosinophils Absolute: 0.1 10*3/uL (ref 0.0–0.7)
Eosinophils Relative: 1.5 % (ref 0.0–5.0)
HCT: 45.6 % (ref 39.0–52.0)
Hemoglobin: 15.3 g/dL (ref 13.0–17.0)
Lymphocytes Relative: 26.5 % (ref 12.0–46.0)
Lymphs Abs: 1.9 10*3/uL (ref 0.7–4.0)
MCHC: 33.5 g/dL (ref 30.0–36.0)
MCV: 91.3 fl (ref 78.0–100.0)
Monocytes Absolute: 0.6 10*3/uL (ref 0.1–1.0)
Monocytes Relative: 8.3 % (ref 3.0–12.0)
Neutro Abs: 4.5 10*3/uL (ref 1.4–7.7)
Neutrophils Relative %: 62.8 % (ref 43.0–77.0)
Platelets: 154 10*3/uL (ref 150.0–400.0)
RBC: 5 Mil/uL (ref 4.22–5.81)
RDW: 14.5 % (ref 11.5–15.5)
WBC: 7.2 10*3/uL (ref 4.0–10.5)

## 2019-09-12 LAB — LIPID PANEL
Cholesterol: 80 mg/dL (ref 0–200)
HDL: 24.1 mg/dL — ABNORMAL LOW (ref >39.00)
LDL Cholesterol: 27 mg/dL (ref 0–99)
NonHDL: 56.31
Total CHOL/HDL Ratio: 3
Triglycerides: 147 mg/dL (ref 0.0–149.0)
VLDL: 29.4 mg/dL (ref 0.0–40.0)

## 2019-09-12 LAB — TSH: TSH: 1.67 u[IU]/mL (ref 0.35–4.50)

## 2019-09-12 LAB — HEMOGLOBIN A1C: Hgb A1c MFr Bld: 6.5 % (ref 4.6–6.5)

## 2019-09-13 LAB — MICROALBUMIN / CREATININE URINE RATIO
Creatinine, Urine: 99 mg/dL (ref 20–320)
Microalb Creat Ratio: 20 mcg/mg creat (ref <30)
Microalb, Ur: 2 mg/dL

## 2019-11-02 DIAGNOSIS — D0439 Carcinoma in situ of skin of other parts of face: Secondary | ICD-10-CM | POA: Diagnosis not present

## 2019-11-06 ENCOUNTER — Other Ambulatory Visit: Payer: Self-pay

## 2019-11-06 ENCOUNTER — Ambulatory Visit
Admission: RE | Admit: 2019-11-06 | Discharge: 2019-11-06 | Disposition: A | Discharge status: Home / Self Care | Payer: Medicare Other | Source: Ambulatory Visit | Attending: Internal Medicine | Admitting: Internal Medicine

## 2019-11-06 DIAGNOSIS — Z86718 Personal history of other venous thrombosis and embolism: Secondary | ICD-10-CM | POA: Diagnosis not present

## 2019-11-06 DIAGNOSIS — I82412 Acute embolism and thrombosis of left femoral vein: Secondary | ICD-10-CM

## 2019-11-14 ENCOUNTER — Ambulatory Visit (INDEPENDENT_AMBULATORY_CARE_PROVIDER_SITE_OTHER): Payer: Medicare Other | Admitting: Internal Medicine

## 2019-11-14 ENCOUNTER — Other Ambulatory Visit: Payer: Self-pay

## 2019-11-14 ENCOUNTER — Encounter: Payer: Self-pay | Admitting: Internal Medicine

## 2019-11-14 VITALS — BP 126/74 | HR 67 | Temp 98.1°F | Ht 72.0 in | Wt 253.4 lb

## 2019-11-14 DIAGNOSIS — M1711 Unilateral primary osteoarthritis, right knee: Secondary | ICD-10-CM

## 2019-11-14 DIAGNOSIS — E669 Obesity, unspecified: Secondary | ICD-10-CM

## 2019-11-14 DIAGNOSIS — D099 Carcinoma in situ, unspecified: Secondary | ICD-10-CM

## 2019-11-14 DIAGNOSIS — R351 Nocturia: Secondary | ICD-10-CM

## 2019-11-14 DIAGNOSIS — Z96652 Presence of left artificial knee joint: Secondary | ICD-10-CM

## 2019-11-14 DIAGNOSIS — Z1389 Encounter for screening for other disorder: Secondary | ICD-10-CM

## 2019-11-14 DIAGNOSIS — E1159 Type 2 diabetes mellitus with other circulatory complications: Secondary | ICD-10-CM | POA: Diagnosis not present

## 2019-11-14 DIAGNOSIS — L570 Actinic keratosis: Secondary | ICD-10-CM

## 2019-11-14 DIAGNOSIS — N401 Enlarged prostate with lower urinary tract symptoms: Secondary | ICD-10-CM | POA: Diagnosis not present

## 2019-11-14 DIAGNOSIS — Z01818 Encounter for other preprocedural examination: Secondary | ICD-10-CM

## 2019-11-14 DIAGNOSIS — E039 Hypothyroidism, unspecified: Secondary | ICD-10-CM | POA: Diagnosis not present

## 2019-11-14 DIAGNOSIS — I10 Essential (primary) hypertension: Secondary | ICD-10-CM

## 2019-11-14 DIAGNOSIS — I824Z2 Acute embolism and thrombosis of unspecified deep veins of left distal lower extremity: Secondary | ICD-10-CM

## 2019-11-14 NOTE — Progress Notes (Signed)
Chief Complaint  Patient presents with  . Follow-up   F/u  1. Of note pt transitioning to plant based diet with wife  2. DVT left leg resolved after [place] 11/06/19 no DVT it seems provoked as pt had knee surgery left and then 06/2019 drove to [place] which is 9 hour drive w/o much stops on the way back on eliquis since 07/18/19 5 mg bid and wants to know when to transition back to plavix 75 mg qd he has plavix at home to do this but wants to wait 1 month due to has just picked up 1 month of eliquis 5 mg bid  3. Mild bph no need to see urology for now sx's include nocturia 2-3 x per night pt does not drink water after 7 pm but is not bothered by these sx's  4. Rightt knee arthritis getting gel shot with ortho 11/2019 and f/u with ortho s/p TKR left still has 2/10 pain left knee  5. Mouth lesion need to get records D.r Tami Ribas he will only f/u [facility] if referred again by Dr. Tami Ribas   Review of Systems  Constitutional: Negative for weight loss.  HENT: Negative for hearing loss.   Eyes: Negative for blurred vision.  Respiratory: Negative for shortness of breath.   Cardiovascular: Negative for chest pain.  Gastrointestinal: Negative for abdominal pain.  Musculoskeletal: Positive for joint pain.  Skin: Negative for rash.  Neurological: Negative for headaches.  Psychiatric/Behavioral: Negative for depression and memory loss.   Past Medical History:  Diagnosis Date  . Actinic keratoses   . Allergy   . Arthritis    low back pain s/p shots and blocks, knee pain  . BCC (basal cell carcinoma of skin)    forehead and chest Dr. Evorn Gong q 6 months   . CAD (coronary artery disease)   . Complication of anesthesia    SEVERE SORE THROAT AFTER BIOPSY 2010  . Coronary artery disease    Chronic total occlusion of mid LAD  . Diabetes mellitus without complication ([place])   . GERD (gastroesophageal reflux disease)   . History of chicken pox   . History of shingles   . Hyperlipidemia   . Hypertension   .  Hypothyroidism   . OSA on CPAP   . Squamous cell carcinoma in situ    nose 2021   . 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 Tami Ribas, [facility] H/o    Past Surgical History:  Procedure Laterality Date  . 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 CARDIAC CATHETERIZATION N/A 11/10/2015   Procedure: Left Heart Cath and Coronary Angiography;  Surgeon: Wellington Hampshire, MD;  Location: [facility];  Service: Cardiovascular;  Laterality: N/A;  . COLONOSCOPY  05/2006  . COLONOSCOPY WITH PROPOFOL N/A 05/24/2017   Procedure: COLONOSCOPY WITH PROPOFOL;  Surgeon: Lollie Sails, MD;  Location: [facility] ENDOSCOPY;  Service: Endoscopy;  Laterality: N/A;  . EXCISION OF TONGUE LESION N/A 11/11/2015   Procedure: EXCISION OF TONGUE LESION/ WITH FROZEN SECTION;  Surgeon: Beverly Gust, MD;  Location: [facility] ORS;  Service: ENT;  Laterality: N/A;  . REPLACEMENT TOTAL KNEE Left    Family History  Problem Relation Age of Onset  . Myelodysplastic syndrome Mother   . Arthritis Mother   . Emphysema Father   . Alcohol abuse Father   . COPD Father   . Depression Father   . Early  death Maternal Grandfather   . Heart disease Maternal Grandfather   . Stroke Maternal Grandfather    Social History   Socioeconomic History  . Marital status: Married    Spouse name: Not on file  . Number of children: Not on file  . Years of education: Not on file  . Highest education level: Not on file  Occupational History  . Not on file  Tobacco Use  . Smoking status: Never Smoker  . Smokeless tobacco: Never Used  Vaping Use  . Vaping Use: Never used  Substance and Sexual Activity  . Alcohol use: Not Currently    Alcohol/week: 0.0 standard drinks    Comment: occassionally   . Drug use: No  . Sexual activity: Yes  Other Topics Concern  . 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 Difficulty of Paying Living Expenses: Not hard at all  Food Insecurity: No Food Insecurity  . Worried About Charity fundraiser in the Last Year: Never true  . Ran Out of Food in the Last Year: Never true  Transportation Needs: No Transportation Needs  . Lack of Transportation (Medical): No  . Lack of Transportation (Non-Medical): No  Physical Activity: Unknown  . Days of Exercise per Week: 0 days  . Minutes of Exercise per Session: Not on file  Stress: No Stress Concern Present  . Feeling of Stress : Not at all  Social Connections:   . Frequency of Communication with Friends and Family:   . Frequency of Social Gatherings with Friends and Family:   . Attends Religious Services:   . Active Member of Clubs or Organizations:   . Attends Archivist Meetings:   Marland Kitchen Marital Status:   Intimate Partner Violence: Not At Risk  . Fear of Current or Ex-Partner: No  . Emotionally Abused: No  . Physically Abused: No  . Sexually Abused: No   Current Meds  Medication Sig  . Acetaminophen (TYLENOL PO) Take by mouth.  Marland Kitchen aspirin 81 MG tablet Take 81 mg by mouth daily.  Marland Kitchen atorvastatin (LIPITOR) 20 MG tablet Take 1 tablet (20 mg total) by mouth daily at 6 PM. At night  . bisoprolol-hydrochlorothiazide (ZIAC) 2.5-6.25 MG tablet Take 1 tablet by mouth daily.  . clopidogrel (PLAVIX) 75 MG tablet Take 75 mg by mouth daily.  . empagliflozin (JARDIANCE) 25 MG TABS tablet Take 25 mg by mouth daily.  Marland Kitchen glimepiride (AMARYL) 1 MG tablet TAKE ONE TABLET BY MOUTH EVERY MORNING AND TAKE ONE TABLET BY MOUTH WITH SUPPER  . glucose blood test strip E 11.9 qd Once Daily one touch  . isosorbide mononitrate (IMDUR) 30 MG 24 hr tablet TAKE ONE TABLET BY MOUTH EVERY MORNING AND TWO TABLETS EVERY EVENING  . Lancet Device MISC 1 Device by Does not apply route as directed.  . Lancets (ONETOUCH  ULTRASOFT) lancets Qd E 11.9 Once daily  . levothyroxine (SYNTHROID) 200 MCG tablet Take 1 tablet (200 mcg total) by mouth daily. In am on empty stomach  . liothyronine (CYTOMEL) 5 MCG tablet Take 0.5 tablets (2.5 mcg total) by mouth every other day.  . metFORMIN (GLUCOPHAGE) 1000 MG tablet Take 1 tablet (1,000 mg total) by mouth daily with breakfast.  . Multiple  Vitamins-Minerals (CENTRUM SILVER 50+MEN PO) Take 1 tablet by mouth every morning.   Marland Kitchen omeprazole (PRILOSEC) 40 MG capsule Take 1 capsule (40 mg total) by mouth daily.  . ramipril (ALTACE) 10 MG capsule Take 2 capsules (20 mg total) by mouth daily.  . [DISCONTINUED] apixaban (ELIQUIS) 5 MG TABS tablet (5mg ) twice daily   Allergies  Allergen Reactions  . Penicillins Hives, Rash, Other (See Comments) and Swelling    Has patient had a PCN reaction causing immediate rash, facial/tongue/throat swelling, SOB or lightheadedness with hypotension: Yes Has patient had a PCN reaction causing severe rash involving mucus membranes or skin necrosis: No Has patient had a PCN reaction that required hospitalization No Has patient had a PCN reaction occurring within the last 10 years: No If all of the above answers are "NO", then may proceed with Cephalosporin use.    Recent Results (from the past 2160 hour(s))  Microalbumin / creatinine urine ratio     Status: None   Collection Time: 09/12/19  8:01 AM  Result Value Ref Range   Creatinine, Urine 99 20 - 320 mg/dL   Microalb, Ur 2.0 mg/dL    Comment: Reference Range Not established    Microalb Creat Ratio 20 <30 mcg/mg creat    Comment: . The ADA defines abnormalities in albumin excretion as follows: Marland Kitchen Category         Result (mcg/mg creatinine) . Normal                    <30 Microalbuminuria         30-299  Clinical albuminuria   > OR = 300 . The ADA recommends that at least two of three specimens collected within a 3-6 month period be abnormal before considering a patient to  be within a diagnostic category.   Hemoglobin A1c     Status: None   Collection Time: 09/12/19  8:01 AM  Result Value Ref Range   Hgb A1c MFr Bld 6.5 4.6 - 6.5 %    Comment: Glycemic Control Guidelines for People with Diabetes:Non Diabetic:  <6%Goal of Therapy: <7%Additional Action Suggested:  >8%   TSH     Status: None   Collection Time: 09/12/19  8:01 AM  Result Value Ref Range   TSH 1.67 0.35 - 4.50 uIU/mL  CBC w/Diff     Status: None   Collection Time: 09/12/19  8:01 AM  Result Value Ref Range   WBC 7.2 4.0 - 10.5 K/uL   RBC 5.00 4.22 - 5.81 Mil/uL   Hemoglobin 15.3 13.0 - 17.0 g/dL   HCT 45.6 39 - 52 %   MCV 91.3 78.0 - 100.0 fl   MCHC 33.5 30.0 - 36.0 g/dL   RDW 14.5 11.5 - 15.5 %   Platelets 154.0 150 - 400 K/uL   Neutrophils Relative % 62.8 43 - 77 %   Lymphocytes Relative 26.5 12 - 46 %   Monocytes Relative 8.3 3 - 12 %   Eosinophils Relative 1.5 0 - 5 %   Basophils Relative 0.9 0 - 3 %   Neutro Abs 4.5 1.4 - 7.7 K/uL   Lymphs Abs 1.9 0.7 - 4.0 K/uL   Monocytes Absolute 0.6 0 - 1 K/uL   Eosinophils Absolute 0.1 0 - 0 K/uL   Basophils Absolute 0.1 0 - 0 K/uL  Lipid panel     Status: Abnormal   Collection Time: 09/12/19  8:01 AM  Result Value Ref Range   Cholesterol 80  0 - 200 mg/dL    Comment: ATP III Classification       Desirable:  < 200 mg/dL               Borderline High:  200 - 239 mg/dL          High:  > = 240 mg/dL   Triglycerides 147.0 0 - 149 mg/dL    Comment: Normal:  <150 mg/dLBorderline High:  150 - 199 mg/dL   HDL 24.10 (L) >39.00 mg/dL   VLDL 29.4 0.0 - 40.0 mg/dL   LDL Cholesterol 27 0 - 99 mg/dL   Total CHOL/HDL Ratio 3     Comment:                Men          Women1/2 Average Risk     3.4          3.3Average Risk          5.0          4.42X Average Risk          9.6          7.13X Average Risk          15.0          11.0                       NonHDL 56.31     Comment: NOTE:  Non-HDL goal should be 30 mg/dL higher than patient's LDL goal (i.e. LDL  goal of < 70 mg/dL, would have non-HDL goal of < 100 mg/dL)  Comprehensive metabolic panel     Status: Abnormal   Collection Time: 09/12/19  8:01 AM  Result Value Ref Range   Sodium 138 135 - 145 mEq/L   Potassium 4.1 3.5 - 5.1 mEq/L   Chloride 103 96 - 112 mEq/L   CO2 27 19 - 32 mEq/L   Glucose, Bld 116 (H) 70 - 99 mg/dL   BUN 13 6 - 23 mg/dL   Creatinine, Ser 1.06 0.40 - 1.50 mg/dL   Total Bilirubin 0.6 0.2 - 1.2 mg/dL   Alkaline Phosphatase 75 39 - 117 U/L   AST 21 0 - 37 U/L   ALT 19 0 - 53 U/L   Total Protein 7.0 6.0 - 8.3 g/dL   Albumin 4.1 3.5 - 5.2 g/dL   GFR 68.24 >60.00 mL/min   Calcium 9.0 8.4 - 10.5 mg/dL   Objective  Body mass index is 34.37 kg/m. Wt Readings from Last 3 Encounters:  11/14/19 253 lb 6.4 oz (114.9 kg)  07/18/19 247 lb 3.2 oz (112.1 kg)  07/06/19 245 lb (111.1 kg)   Temp Readings from Last 3 Encounters:  11/14/19 98.1 F (36.7 C) (Oral)  07/18/19 (!) 97 F (36.1 C) (Temporal)  07/04/19 98.6 F (37 C) (Oral)   BP Readings from Last 3 Encounters:  11/14/19 126/74  07/18/19 122/70  07/06/19 (!) 159/75   Pulse Readings from Last 3 Encounters:  11/14/19 67  07/18/19 64  07/06/19 76    Physical Exam Vitals and nursing note reviewed.  Constitutional:      Appearance: Normal appearance. He is well-developed and well-groomed. He is obese.  HENT:     Head: Normocephalic and atraumatic.  Eyes:     Conjunctiva/sclera: Conjunctivae normal.     Pupils: Pupils are equal, round, and reactive to light.  Cardiovascular:     Rate and Rhythm:  Normal rate and regular rhythm.     Heart sounds: Normal heart sounds. No murmur heard.   Pulmonary:     Effort: Pulmonary effort is normal.     Breath sounds: Normal breath sounds.  Skin:    General: Skin is warm and dry.  Neurological:     General: No focal deficit present.     Mental Status: He is alert and oriented to person, place, and time. Mental status is at baseline.     Gait: Gait normal.   Psychiatric:        Attention and Perception: Attention and perception normal.        Mood and Affect: Mood and affect normal.        Speech: Speech normal.        Behavior: Behavior normal. Behavior is cooperative.        Thought Content: Thought content normal.        Cognition and Memory: Cognition and memory normal.        Judgment: Judgment normal.     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 Gustavo Lah  PSA 0.90 h/o mild BPH on imaging f/u urology Dr. Bernardo Heater  Dr. Sharlet Salina as of 11/14/19 back issues ok  Derm Dr. Evorn Gong h/o BCC forehead and chestsaw 9 or 01/2019 ln2 to scalp still unresolved as of 02/28/2019 rec call back for more ln2 before 1 year Derm 05/2019 ln2 to scalp f/u in 6 months  Residual scalp Aks x 2 f/u dermatology, scc superficial nose tx'ed as of 11/14/19  ENT Dr. Bernestine Amass [facility]-will f/u prnQ3 months appt 4 or 08/2019  Dr. Tami Ribas ENT ROI sent 11/14/19    [place] [facility] Dr. Merla Riches  Dentist Dr. Eugenie Birks  Cardiology Dr. Saunders Revel  GI Dr. Gustavo Lah Provider: Dr. Olivia Mackie McLean-Scocuzza-Internal Medicine

## 2019-11-14 NOTE — Patient Instructions (Signed)
Start plavix 75 daily next daily after running out of eliquis

## 2019-11-14 NOTE — Progress Notes (Signed)
Patient flagged: Current status:  PATIENT IS OVERDUE FOR BMI FOLLOW UP PLAN BMI is estimated to be 34.4 based on the last recorded weight and height

## 2019-11-15 ENCOUNTER — Encounter: Payer: Self-pay | Admitting: Internal Medicine

## 2019-11-15 DIAGNOSIS — R351 Nocturia: Secondary | ICD-10-CM | POA: Insufficient documentation

## 2019-11-15 DIAGNOSIS — Z96652 Presence of left artificial knee joint: Secondary | ICD-10-CM

## 2019-11-15 DIAGNOSIS — L570 Actinic keratosis: Secondary | ICD-10-CM | POA: Insufficient documentation

## 2019-11-15 DIAGNOSIS — M1711 Unilateral primary osteoarthritis, right knee: Secondary | ICD-10-CM

## 2019-11-15 DIAGNOSIS — E669 Obesity, unspecified: Secondary | ICD-10-CM | POA: Insufficient documentation

## 2019-11-15 DIAGNOSIS — D099 Carcinoma in situ, unspecified: Secondary | ICD-10-CM | POA: Insufficient documentation

## 2019-11-15 DIAGNOSIS — N401 Enlarged prostate with lower urinary tract symptoms: Secondary | ICD-10-CM | POA: Insufficient documentation

## 2019-11-15 HISTORY — DX: Presence of left artificial knee joint: Z96.652

## 2019-11-15 HISTORY — DX: Unilateral primary osteoarthritis, right knee: M17.11

## 2019-11-28 DIAGNOSIS — M1711 Unilateral primary osteoarthritis, right knee: Secondary | ICD-10-CM | POA: Diagnosis not present

## 2019-11-28 DIAGNOSIS — I824Y9 Acute embolism and thrombosis of unspecified deep veins of unspecified proximal lower extremity: Secondary | ICD-10-CM | POA: Diagnosis not present

## 2019-11-28 DIAGNOSIS — Z96652 Presence of left artificial knee joint: Secondary | ICD-10-CM | POA: Diagnosis not present

## 2019-12-06 DIAGNOSIS — M1711 Unilateral primary osteoarthritis, right knee: Secondary | ICD-10-CM | POA: Diagnosis not present

## 2019-12-07 ENCOUNTER — Telehealth: Payer: Self-pay | Admitting: Internal Medicine

## 2019-12-07 NOTE — Addendum Note (Signed)
Addended by: Orland Mustard on: 12/07/2019 06:10 PM   Modules accepted: Orders

## 2019-12-07 NOTE — Telephone Encounter (Signed)
   [facility] Pre-operative Risk Assessment    HEARTCARE STAFF: - Please ensure there is not already an duplicate clearance open for this procedure. - Under Visit Info/Reason for Call, type in Other and utilize the format Clearance MM/DD/YY or Clearance TBD. Do not use dashes or single digits. - If request is for dental extraction, please clarify the # of teeth to be extracted.  Request for surgical clearance:  1. What type of surgery is being performed? Right Total Knee Arthroplasty  2. When is this surgery scheduled? 05/06/20  3. What type of clearance is required (medical clearance vs. Pharmacy clearance to hold med vs. Both)? both  4. Are there any medications that need to be held prior to surgery and how long? Not listed, please advise if needed   5. Practice name and name of physician performing surgery? Dr Meta Hatchet outpatient hip and knee surgery  6. What is the office phone number? [phone]   7.   What is the office fax number? [phone]  8.   Anesthesia type (None, local, MAC, general) ? epidural   Ace Gins 12/07/2019, 2:45 PM  _________________________________________________________________   (provider comments below)

## 2019-12-07 NOTE — Telephone Encounter (Signed)
Please call pt to schedule an appt with Dr. Saunders Revel or APP for clearance per Coletta Memos, NP

## 2019-12-07 NOTE — Telephone Encounter (Signed)
Tony Lawrence  Please move lab appt back to 12/11 or 04/06/20 fasting due to needs medical/cardiac clearance labs, cards clearance for right total knee with Dr. Lorita Officer w/in 30 days of right total knee 05/06/20   Dr. Saunders Revel this is FYI  he needs EKG w/in 6 months for knee surgery  I can order all the labs needed w/in 30 days of surgery  Tony Lawrence and Dr. Saunders Revel: any notes, EKG,lab results need to be faxed to Dr. Lorita Officer ortho [phone] before surgery 05/06/20 total right knee replacement

## 2019-12-07 NOTE — Telephone Encounter (Signed)
Primary Cardiologist:No primary care provider on file.  Chart reviewed as part of pre-operative protocol coverage. Because of Tony Lawrence's past medical history and time since last visit, he/she will require a follow-up visit in order to better assess preoperative cardiovascular risk.  Pre-op covering staff: - Please schedule appointment and call patient to inform them. - Please contact requesting surgeon's office via preferred method (i.e, phone, fax) to inform them of need for appointment prior to surgery.  If applicable, this message will also be routed to pharmacy pool and/or primary cardiologist for input on holding anticoagulant/antiplatelet agent as requested below so that this information is available at time of patient's appointment.   Deberah Pelton, NP  12/07/2019, 3:03 PM

## 2019-12-10 DIAGNOSIS — M1711 Unilateral primary osteoarthritis, right knee: Secondary | ICD-10-CM | POA: Diagnosis not present

## 2019-12-10 NOTE — Telephone Encounter (Signed)
Pt currently scheduled for appt with Dr. Saunders Revel on 8/27 for follow-up visit. Added to appt notes that pt will need appt scheduled for clearance closer to surgery date.

## 2019-12-10 NOTE — Telephone Encounter (Signed)
Patient scheduled for 04/07/20 at 8:45 am for fasting labs.

## 2019-12-10 NOTE — Telephone Encounter (Signed)
Tony Lawrence is scheduled to see me for f/u and cardiac clearance on 12/21/19.  Thanks.  Nelva Bush, MD [facility]

## 2019-12-12 NOTE — Telephone Encounter (Signed)
Faxed paperwork to Dr.RICHARD A. Nash Mantis for pre-operative  For patient

## 2019-12-19 ENCOUNTER — Ambulatory Visit: Payer: Medicare Other | Admitting: Internal Medicine

## 2019-12-21 ENCOUNTER — Other Ambulatory Visit: Payer: Self-pay

## 2019-12-21 ENCOUNTER — Ambulatory Visit (INDEPENDENT_AMBULATORY_CARE_PROVIDER_SITE_OTHER): Payer: Medicare Other | Admitting: Internal Medicine

## 2019-12-21 ENCOUNTER — Encounter: Payer: Self-pay | Admitting: Internal Medicine

## 2019-12-21 VITALS — BP 136/60 | HR 61 | Ht 70.0 in | Wt 251.0 lb

## 2019-12-21 DIAGNOSIS — I10 Essential (primary) hypertension: Secondary | ICD-10-CM | POA: Diagnosis not present

## 2019-12-21 DIAGNOSIS — R011 Cardiac murmur, unspecified: Secondary | ICD-10-CM | POA: Diagnosis not present

## 2019-12-21 DIAGNOSIS — I340 Nonrheumatic mitral (valve) insufficiency: Secondary | ICD-10-CM

## 2019-12-21 DIAGNOSIS — I25118 Atherosclerotic heart disease of native coronary artery with other forms of angina pectoris: Secondary | ICD-10-CM | POA: Diagnosis not present

## 2019-12-21 DIAGNOSIS — E785 Hyperlipidemia, unspecified: Secondary | ICD-10-CM

## 2019-12-21 DIAGNOSIS — Z01810 Encounter for preprocedural cardiovascular examination: Secondary | ICD-10-CM | POA: Diagnosis not present

## 2019-12-21 NOTE — Progress Notes (Addendum)
Follow-up Outpatient Visit Date: 12/21/2019  Primary Care Provider: McLean-Scocuzza, Nino Glow, MD [place] [address]  Chief Complaint: Follow-up coronary artery disease  HPI:  Tony Lawrence is a 74 y.o. male with history of coronary artery disease with chronic total occlusion of the mid LAD, hypertension, hyperlipidemia,diabetes mellitus, obstructive sleep apnea, and hypothyroidism, who presents for follow-up of coronary artery disease.  I last saw Tony Lawrence in February, at which time he was doing relatively well, recovering from left total knee replacement almost 2 months earlier.  He was also undergoing urologic evaluation for two episodes of gross hematuria.  No medication changes or further testing were recommended.  He was subsequently diagnosed with acute left lower extremity DVT after an extended car trip, prompting transition from clopidogrel to apixaban.  He has completed 4 months of apixaban and is now back on aspirin and clopidogrel.  Tony Lawrence feels well other than chronic knee pain.  He is still recovering from his left total knee replacement earlier this year.  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  He denies chest pain, shortness of breath, palpitations, lightheadedness, and claudication.  He has some mild leg swelling at times.  He is able to climb 2 flights of stairs, albeit slowly due to his knee issues.  He has not had any bleeding, remaining on aspirin and clopidogrel.  --------------------------------------------------------------------------------------------------  Past Medical History:  Diagnosis Date  . Actinic keratoses   . Allergy   . Arthritis    low back pain s/p shots and blocks, knee pain  . BCC (basal cell carcinoma of skin)    forehead and chest Dr. Evorn Gong q 6 months   . CAD (coronary artery disease)   . Complication of anesthesia    SEVERE SORE THROAT AFTER  BIOPSY 2010  . Coronary artery disease    Chronic total occlusion of mid LAD  . Diabetes mellitus without complication ([place])   . GERD (gastroesophageal reflux disease)   . History of chicken pox   . History of shingles   . Hyperlipidemia   . Hypertension   . Hypothyroidism   . OSA on CPAP   . Squamous cell carcinoma in situ    nose 2021   . 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 Tami Ribas, [facility]    Past Surgical History:  Procedure Laterality Date  . 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 CARDIAC CATHETERIZATION N/A 11/10/2015   Procedure: Left Heart Cath and Coronary Angiography;  Surgeon: Wellington Hampshire, MD;  Location: [facility];  Service: Cardiovascular;  Laterality: N/A;  . COLONOSCOPY  05/2006  . COLONOSCOPY WITH PROPOFOL N/A 05/24/2017   Procedure: COLONOSCOPY WITH PROPOFOL;  Surgeon: Lollie Sails, MD;  Location: [facility] ENDOSCOPY;  Service: Endoscopy;  Laterality: N/A;  . EXCISION OF TONGUE LESION N/A 11/11/2015   Procedure: EXCISION OF TONGUE LESION/ WITH FROZEN SECTION;  Surgeon: Beverly Gust, MD;  Location: [facility] ORS;  Service: ENT;  Laterality: N/A;  . REPLACEMENT TOTAL KNEE Left     Current Meds  Medication Sig  . Acetaminophen (TYLENOL PO) Take by mouth.  Marland Kitchen aspirin 81 MG tablet Take 81 mg by mouth daily.  Marland Kitchen atorvastatin (LIPITOR) 20 MG tablet Take 1 tablet (20 mg total) by mouth daily at 6 PM. At night  . bisoprolol-hydrochlorothiazide [facility])  2.5-6.25 MG tablet Take 1 tablet by mouth daily.  . clopidogrel (PLAVIX) 75 MG tablet Take 75 mg by mouth daily.  . empagliflozin (JARDIANCE) 25 MG TABS tablet Take 25 mg by mouth daily.  Marland Kitchen glimepiride (AMARYL) 1 MG tablet TAKE ONE TABLET BY MOUTH EVERY MORNING AND TAKE ONE TABLET BY MOUTH WITH SUPPER  . glucose blood test strip E 11.9 qd Once Daily one touch  . isosorbide mononitrate (IMDUR) 30 MG 24 hr tablet TAKE ONE TABLET BY MOUTH EVERY MORNING AND TWO  TABLETS EVERY EVENING  . Lancet Device MISC 1 Device by Does not apply route as directed.  . Lancets (ONETOUCH ULTRASOFT) lancets Qd E 11.9 Once daily  . levothyroxine (SYNTHROID) 200 MCG tablet Take 1 tablet (200 mcg total) by mouth daily. In am on empty stomach  . liothyronine (CYTOMEL) 5 MCG tablet Take 0.5 tablets (2.5 mcg total) by mouth every other day.  . metFORMIN (GLUCOPHAGE) 1000 MG tablet Take 1 tablet (1,000 mg total) by mouth daily with breakfast.  . Multiple Vitamins-Minerals (CENTRUM SILVER 50+MEN PO) Take 1 tablet by mouth every morning.   Marland Kitchen omeprazole (PRILOSEC) 40 MG capsule Take 1 capsule (40 mg total) by mouth daily.  . ramipril (ALTACE) 10 MG capsule Take 2 capsules (20 mg total) by mouth daily.    Allergies: Penicillins  Social History   Tobacco Use  . Smoking status: Never Smoker  . Smokeless tobacco: Never Used  Vaping Use  . Vaping Use: Never used  Substance Use Topics  . Alcohol use: Not Currently    Alcohol/week: 0.0 standard drinks    Comment: occassionally   . Drug use: No    Family History  Problem Relation Age of Onset  . Myelodysplastic syndrome Mother   . Arthritis Mother   . Emphysema Father   . Alcohol abuse Father   . COPD Father   . Depression Father   . Early death Maternal Grandfather   . Heart disease Maternal Grandfather   . Stroke Maternal Grandfather     Review of Systems: A 12-system review of systems was performed and was negative except as noted in the HPI.  --------------------------------------------------------------------------------------------------  Physical Exam: BP 136/60 (BP Location: Left Arm, Patient Position: Sitting, Cuff Size: Normal)   Pulse 61   Ht 5\' 10"  (1.778 m)   Wt 251 lb (113.9 kg)   BMI 36.01 kg/m   General: NAD. Neck: No JVD or HJR. Lungs: Clear to auscultation bilaterally without wheezes or crackles. Heart: Regular rate and rhythm with 2/6 systolic murmur.  No rubs or gallops. Abdomen:  Soft, nontender, nondistended. Extremities: Trace pretibial edema, left greater than right.  EKG: Normal sinus rhythm with left axis deviation.  Otherwise, no significant abnormality.  Lab Results  Component Value Date   WBC 7.2 09/12/2019   HGB 15.3 09/12/2019   HCT 45.6 09/12/2019   MCV 91.3 09/12/2019   PLT 154.0 09/12/2019    Lab Results  Component Value Date   NA 138 09/12/2019   K 4.1 09/12/2019   CL 103 09/12/2019   CO2 27 09/12/2019   BUN 13 09/12/2019   CREATININE 1.06 09/12/2019   GLUCOSE 116 (H) 09/12/2019   ALT 19 09/12/2019    Lab Results  Component Value Date   CHOL 80 09/12/2019   HDL 24.10 (L) 09/12/2019   LDLCALC 27 09/12/2019   TRIG 147.0 09/12/2019   CHOLHDL 3 09/12/2019    --------------------------------------------------------------------------------------------------  ASSESSMENT AND PLAN: Coronary artery disease with stable angina:  Tony Lawrence does not have any chest pain or shortness of breath, with his angina being well controlled on his current regimen of bisoprolol and isosorbide mononitrate.  We have agreed to continue this as well as indefinite dual antiplatelet therapy.  DAPT can be interrupted for elective procedures and restarted when felt safe to do so from a postoperative standpoint.  Mitral regurgitation: Tony Lawrence was noted to have mild mitral regurgitation by his most recent echo in 2017.  Systolic murmur is more pronounced on examination today.  Given that he is planning to undergo right total knee arthroplasty in the next 6 months, I recommended that we repeat an echocardiogram to exclude worsening mitral regurgitation or other structural abnormality that could interfere with his perioperative course.  Hypertension: Blood pressure well controlled.  Continue current regimen of bisoprolol-HCTZ, isosorbide mononitrate, and ramipril.  Hyperlipidemia: LDL excellent on last check in May at 27.  We will continue atorvastatin 20 mg  daily.  Exercise, as tolerated, recommended to help increase HDL, which remains somewhat low.  Morbid obesity: BMI greater than 35 with multiple comorbidities (coronary artery disease, hypertension, hyperlipidemia, and type 2 diabetes mellitus).  Weight loss encouraged through diet and exercise, which will hopefully be easier to do once Tony Lawrence has undergone right total knee replacement as well.  Preoperative cardiovascular risk assessment: Tony Lawrence does not report any unstable cardiac symptoms.  As noted above, I have recommended that we obtain a transthoracic echocardiogram to reassess his mitral regurgitation and exclude other significant structural abnormalities.  Assuming his transthoracic echocardiogram is stable, I think it is reasonable for Tony Lawrence to proceed with elective right total knee replacement without additional cardiac testing or intervention.  Though he has a known chronic total occlusion of the mid LAD, he is able to perform at least 4 METS of activity without limitations.  Based on the ACS combined surgical/clinic risk calculator, Tony Lawrence risk for perioperative cardiac complication is <7%.  Additional testing or intervention is unlikely to further mitigate his perioperative risk.  Clopidogrel should be held 5 to 7 days prior to the procedure and restarted when it is felt safe to do so from an operatstandpoint.  Of note, aggressive DVT prophylaxis in the perioperative period is recommended, given Tony Lawrence acute left lower extremity DVT shortly after left total knee replacement earlier this year.  Clopidogrel can be held in the setting of anticoagulation in the postoperative period.  ADDENDUM (02/20/2020): Echocardiogram performed 02/07/20 shows normal LVEF with mild aortic regurgitation and mild dilation of the thoracic aorta.  It is reasonable for Tony Lawrence to proceed with orthopedic surgery, as outlined above.  Clopidogrel can be stopped 7 days before the  procedure.  I would prefer that aspirin 81 mg daily be continued in the perioperative period, given history of CAD with chronic total occlusion of the mid LAD by catheterization in 2017.  However, if risk for perioperative bleeding is excessively high, aspirin can be held 10 days prior to surgery and restarted it is felt safe to do so from a surgical standpoint.  Follow-up: Return to clinic in 1 year.  Nelva Bush, MD 12/21/2019 2:34 PM

## 2019-12-21 NOTE — Patient Instructions (Signed)
Medication Instructions:  Your physician recommends that you continue on your current medications as directed. Please refer to the Current Medication list given to you today.  *If you need a refill on your cardiac medications before your next appointment, please call your pharmacy*   Lab Work: none If you have labs (blood work) drawn today and your tests are completely normal, you will receive your results only by: Marland Kitchen MyChart Message (if you have MyChart) OR . A paper copy in the mail If you have any lab test that is abnormal or we need to change your treatment, we will call you to review the results.   Testing/Procedures: Your physician has requested that you have an echocardiogram. Echocardiography is a painless test that uses sound waves to create images of your heart. It provides your doctor with information about the size and shape of your heart and how well your heart's chambers and valves are working. This procedure takes approximately one hour. There are no restrictions for this procedure. You may get an IV, if needed, to receive an ultrasound enhancing agent through to better visualize your heart.   Follow-Up: At [facility], you and your health needs are our priority.  As part of our continuing mission to provide you with exceptional heart care, we have created designated Provider Care Teams.  These Care Teams include your primary Cardiologist (physician) and Advanced Practice Providers (APPs -  Physician Assistants and Nurse Practitioners) who all work together to provide you with the care you need, when you need it.  We recommend signing up for the patient portal called "MyChart".  Sign up information is provided on this After Visit Summary.  MyChart is used to connect with patients for Virtual Visits (Telemedicine).  Patients are able to view lab/test results, encounter notes, upcoming appointments, etc.  Non-urgent messages can be sent to your provider as well.   To learn more  about what you can do with MyChart, go to [email or web address].    Your next appointment:   12 month(s)  The format for your next appointment:   In Person  Provider:    You may see DR Harrell Gave END or one of the following Advanced Practice Providers on your designated Care Team:    Murray Hodgkins, NP  Christell Faith, PA-C  Marrianne Mood, PA-C

## 2019-12-22 ENCOUNTER — Encounter: Payer: Self-pay | Admitting: Internal Medicine

## 2019-12-22 DIAGNOSIS — I340 Nonrheumatic mitral (valve) insufficiency: Secondary | ICD-10-CM

## 2019-12-22 HISTORY — DX: Nonrheumatic mitral (valve) insufficiency: I34.0

## 2019-12-24 DIAGNOSIS — Z85810 Personal history of malignant neoplasm of tongue: Secondary | ICD-10-CM | POA: Diagnosis not present

## 2019-12-24 DIAGNOSIS — J309 Allergic rhinitis, unspecified: Secondary | ICD-10-CM | POA: Diagnosis not present

## 2019-12-24 DIAGNOSIS — R070 Pain in throat: Secondary | ICD-10-CM | POA: Diagnosis not present

## 2019-12-25 ENCOUNTER — Telehealth: Payer: Self-pay | Admitting: Internal Medicine

## 2019-12-25 NOTE — Telephone Encounter (Signed)
He can proceed with tongue biopsy even before echo given that it is under local.  He has no prior stents.  Thus, he can hold both aspirin and Plavix as requested.  Holding Plavix 5 days should be more than sufficient.

## 2019-12-25 NOTE — Telephone Encounter (Signed)
Left message for [facility] to please review clearance recommendations ASAP in regards to when pt has last taken his medications. I will re-fax clearance notes again to be sure that they receive the clearance notes.

## 2019-12-25 NOTE — Telephone Encounter (Signed)
   Primary Cardiologist: Nelva Bush, MD  Chart revisited to finalize recommendations. As below, Dr. Fletcher Anon reviewed chart and felt patient can proceed with tongue biopsy prior to echo since it is under local anesthesia. He indicated the patient may hold ASA + Plavix as requested but also suggests that holding Plavix for 5 days should be more than sufficient. Given the procedure is only a week away, will route this to our callback staff to call the office below to verbally relay these recommendations - please ask them to review and have their office contact the patient with final recommendations of timing of last doses/procedure since he took his ASA last night and Plavix already today.  Otherwise I will route this recommendation to the requesting party via Epic fax function and remove from pre-op pool.  Please call with questions.  Charlie Pitter, PA-C 12/25/2019, 12:38 PM

## 2019-12-25 NOTE — Telephone Encounter (Addendum)
   Primary Cardiologist: Nelva Bush, MD  Chart reviewed as part of pre-operative protocol coverage. Tony Lawrence was recently seen 12/21/19 by Dr. Saunders Revel for cardiac follow-up and also pre-operative clearance. Has hx of CAD with CTO of LAD, hypertension, hyperlipidemia,diabetes mellitus, obsructive sleep apnea, prior DVT, hypothyroidism. Dr. Saunders Revel saw him for pre-op eval for knee surgery and felt he was clinically stable for surgery but first recommended updated echocardiogram in the context of a more pronounced systolic murmur. We have now received a new separate clearance for a tongue biopsy requesting to hold ASA + Plavix for 1 week, procedure is only 1 week away. In recent OV note, Dr. Saunders Revel anticipated patient would be able to hold clopidogrel but did not specifically comment on aspirin which is being asked to hold below.  Dr. Saunders Revel appears out on vacation. D/w [place] triage team - Dr. Fletcher Anon is covering but is in [place] this AM then [facility] PM. Will route to the staff working with him in [place] to help get his input on the following two questions so we can finalize clearance ASAP -  1) Can patient proceed with tongue biopsy as requested without echocardiogram? This procedure is under local only. 2) May the patient hold aspirin in addition to Plavix as well?  I did speak with the patient who already took his Plavix for today (takes Plavix QAM and aspirin QPM) so if 7 full days is needed, may need to postpone procedure since this would have been need to be held starting today.  Charlie Pitter, PA-C 12/25/2019, 10:57 AM

## 2019-12-25 NOTE — Telephone Encounter (Signed)
   [facility] Pre-operative Risk Assessment    HEARTCARE STAFF: - Please ensure there is not already an duplicate clearance open for this procedure. - Under Visit Info/Reason for Call, type in Other and utilize the format Clearance MM/DD/YY or Clearance TBD. Do not use dashes or single digits. - If request is for dental extraction, please clarify the # of teeth to be extracted.  Request for surgical clearance:  1. What type of surgery is being performed? Tongue Biopsy    2. When is this surgery scheduled? 01/01/20   3. What type of clearance is required (medical clearance vs. Pharmacy clearance to hold med vs. Both)? Pharmacy   4. Are there any medications that need to be held prior to surgery and how long? Plavix and ASA for 1 week  5. Practice name and name of physician performing surgery? [facility]    6. What is the office phone number? [phone]   7.   What is the office fax number? [phone]  8.   Anesthesia type (None, local, MAC, general) ? local   Clarisse Gouge 12/25/2019, 10:02 AM  _________________________________________________________________   (provider comments below)

## 2019-12-27 NOTE — Telephone Encounter (Signed)
Patient calling Has not heard from either office on what to do with medication  Patient states he stopped his Plavix on Tuesday but has been still taking low dose aspirin Please call to discuss

## 2019-12-27 NOTE — Telephone Encounter (Signed)
Patient called with advice - advised to hold ASA & plavix as directed - 5 days of holding plavix is sufficient per Dr. Fletcher Anon. Advised that also call ENT office to confirm with them also, since his procedure is 01/01/2020

## 2019-12-30 ENCOUNTER — Telehealth: Payer: Self-pay | Admitting: Internal Medicine

## 2019-12-30 NOTE — Telephone Encounter (Signed)
Dr. Tami Ribas  Dr. End is cardiology managing plavix and for clearance for tongue biopsy and to stop Plavix   Please contact him about stopping plavix for tongue biopsy   Thanks [place] fax note to Dr. Tami Ribas please thanks [place]

## 2020-01-01 ENCOUNTER — Other Ambulatory Visit: Payer: Self-pay | Admitting: Unknown Physician Specialty

## 2020-01-01 DIAGNOSIS — D101 Benign neoplasm of tongue: Secondary | ICD-10-CM | POA: Diagnosis not present

## 2020-01-01 NOTE — Telephone Encounter (Signed)
Please see telephone message from 12/25/19 re: cardiology recommendations.  Thanks.  Nelva Bush, MD [facility]

## 2020-01-03 ENCOUNTER — Telehealth: Payer: Self-pay

## 2020-01-03 DIAGNOSIS — L570 Actinic keratosis: Secondary | ICD-10-CM | POA: Diagnosis not present

## 2020-01-03 DIAGNOSIS — D2271 Melanocytic nevi of right lower limb, including hip: Secondary | ICD-10-CM | POA: Diagnosis not present

## 2020-01-03 DIAGNOSIS — D485 Neoplasm of uncertain behavior of skin: Secondary | ICD-10-CM | POA: Diagnosis not present

## 2020-01-03 DIAGNOSIS — D044 Carcinoma in situ of skin of scalp and neck: Secondary | ICD-10-CM | POA: Diagnosis not present

## 2020-01-03 DIAGNOSIS — D225 Melanocytic nevi of trunk: Secondary | ICD-10-CM | POA: Diagnosis not present

## 2020-01-03 DIAGNOSIS — L859 Epidermal thickening, unspecified: Secondary | ICD-10-CM | POA: Diagnosis not present

## 2020-01-03 DIAGNOSIS — X32XXXA Exposure to sunlight, initial encounter: Secondary | ICD-10-CM | POA: Diagnosis not present

## 2020-01-03 DIAGNOSIS — L538 Other specified erythematous conditions: Secondary | ICD-10-CM | POA: Diagnosis not present

## 2020-01-03 DIAGNOSIS — D2261 Melanocytic nevi of right upper limb, including shoulder: Secondary | ICD-10-CM | POA: Diagnosis not present

## 2020-01-03 DIAGNOSIS — D2262 Melanocytic nevi of left upper limb, including shoulder: Secondary | ICD-10-CM | POA: Diagnosis not present

## 2020-01-03 DIAGNOSIS — L820 Inflamed seborrheic keratosis: Secondary | ICD-10-CM | POA: Diagnosis not present

## 2020-01-03 DIAGNOSIS — Z85828 Personal history of other malignant neoplasm of skin: Secondary | ICD-10-CM | POA: Diagnosis not present

## 2020-01-03 LAB — SURGICAL PATHOLOGY

## 2020-01-03 NOTE — Telephone Encounter (Signed)
CONFIRMED PT APPT 01/07/20. BR °

## 2020-01-07 ENCOUNTER — Ambulatory Visit (INDEPENDENT_AMBULATORY_CARE_PROVIDER_SITE_OTHER): Payer: Medicare Other | Admitting: Internal Medicine

## 2020-01-07 ENCOUNTER — Other Ambulatory Visit: Payer: Self-pay

## 2020-01-07 ENCOUNTER — Encounter: Payer: Self-pay | Admitting: Internal Medicine

## 2020-01-07 VITALS — BP 148/80 | HR 59 | Temp 98.0°F | Resp 16 | Ht 70.0 in | Wt 250.8 lb

## 2020-01-07 DIAGNOSIS — I10 Essential (primary) hypertension: Secondary | ICD-10-CM | POA: Diagnosis not present

## 2020-01-07 DIAGNOSIS — K219 Gastro-esophageal reflux disease without esophagitis: Secondary | ICD-10-CM

## 2020-01-07 DIAGNOSIS — Z9989 Dependence on other enabling machines and devices: Secondary | ICD-10-CM

## 2020-01-07 DIAGNOSIS — I25118 Atherosclerotic heart disease of native coronary artery with other forms of angina pectoris: Secondary | ICD-10-CM

## 2020-01-07 DIAGNOSIS — G4733 Obstructive sleep apnea (adult) (pediatric): Secondary | ICD-10-CM

## 2020-01-07 NOTE — Progress Notes (Signed)
[facility], [place] [address]  Pulmonary Sleep Medicine   Office Visit Note  Patient Name: Tony Lawrence DOB: 09-21-1945 MRN [id number]  Date of Service: 01/07/2020  Complaints/HPI: Pt is here for yearly follow up on osa.  He is using his cpap as directed.  Pt reports good compliance with CPAP therapy. Cleaning machine by hand, and changing filters and tubing as directed. Denies headaches, sinus issues, palpitations, or hemoptysis.    ROS  General: (-) fever, (-) chills, (-) night sweats, (-) weakness Skin: (-) rashes, (-) itching,. Eyes: (-) visual changes, (-) redness, (-) itching. Nose and Sinuses: (-) nasal stuffiness or itchiness, (-) postnasal drip, (-) nosebleeds, (-) sinus trouble. Mouth and Throat: (-) sore throat, (-) hoarseness. Neck: (-) swollen glands, (-) enlarged thyroid, (-) neck pain. Respiratory: - cough, (-) bloody sputum, - shortness of breath, - wheezing. Cardiovascular: - ankle swelling, (-) chest pain. Lymphatic: (-) lymph node enlargement. Neurologic: (-) numbness, (-) tingling. Psychiatric: (-) anxiety, (-) depression   Current Medication: Outpatient Encounter Medications as of 01/07/2020  Medication Sig  . Acetaminophen (TYLENOL PO) Take by mouth.  Marland Kitchen aspirin 81 MG tablet Take 81 mg by mouth daily.  Marland Kitchen atorvastatin (LIPITOR) 20 MG tablet Take 1 tablet (20 mg total) by mouth daily at 6 PM. At night  . bisoprolol-hydrochlorothiazide (ZIAC) 2.5-6.25 MG tablet Take 1 tablet by mouth daily.  . clopidogrel (PLAVIX) 75 MG tablet Take 75 mg by mouth daily.  . empagliflozin (JARDIANCE) 25 MG TABS tablet Take 25 mg by mouth daily.  Marland Kitchen glimepiride (AMARYL) 1 MG tablet TAKE ONE TABLET BY MOUTH EVERY MORNING AND TAKE ONE TABLET BY MOUTH WITH SUPPER  . glucose blood test strip E 11.9 qd Once Daily one touch  . isosorbide mononitrate (IMDUR) 30 MG 24 hr tablet TAKE ONE TABLET BY MOUTH EVERY MORNING AND TWO TABLETS EVERY EVENING  .  Lancet Device MISC 1 Device by Does not apply route as directed.  . Lancets (ONETOUCH ULTRASOFT) lancets Qd E 11.9 Once daily  . levothyroxine (SYNTHROID) 200 MCG tablet Take 1 tablet (200 mcg total) by mouth daily. In am on empty stomach  . liothyronine (CYTOMEL) 5 MCG tablet Take 0.5 tablets (2.5 mcg total) by mouth every other day.  . metFORMIN (GLUCOPHAGE) 1000 MG tablet Take 1 tablet (1,000 mg total) by mouth daily with breakfast.  . Multiple Vitamins-Minerals (CENTRUM SILVER 50+MEN PO) Take 1 tablet by mouth every morning.   Marland Kitchen omeprazole (PRILOSEC) 40 MG capsule Take 1 capsule (40 mg total) by mouth daily.  . ramipril (ALTACE) 10 MG capsule Take 2 capsules (20 mg total) by mouth daily.   No facility-administered encounter medications on file as of 01/07/2020.    Surgical History: Past Surgical History:  Procedure Laterality Date  . 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 CARDIAC CATHETERIZATION N/A 11/10/2015   Procedure: Left Heart Cath and Coronary Angiography;  Surgeon: Wellington Hampshire, MD;  Location: [facility];  Service: Cardiovascular;  Laterality: N/A;  . COLONOSCOPY  05/2006  . COLONOSCOPY WITH PROPOFOL N/A 05/24/2017   Procedure: COLONOSCOPY WITH PROPOFOL;  Surgeon: Lollie Sails, MD;  Location: [facility] ENDOSCOPY;  Service: Endoscopy;  Laterality: N/A;  . EXCISION OF TONGUE LESION N/A 11/11/2015   Procedure: EXCISION OF TONGUE LESION/ WITH FROZEN SECTION;  Surgeon: Beverly Gust, MD;  Location: [facility] ORS;  Service: ENT;  Laterality: N/A;  . REPLACEMENT TOTAL KNEE Left  Medical History: Past Medical History:  Diagnosis Date  . Actinic keratoses   . Allergy   . Arthritis    low back pain s/p shots and blocks, knee pain  . BCC (basal cell carcinoma of skin)    forehead and chest Dr. Evorn Gong q 6 months   . CAD (coronary artery disease)   . Complication of anesthesia    SEVERE SORE THROAT AFTER BIOPSY 2010  . Coronary artery disease    Chronic total  occlusion of mid LAD  . Diabetes mellitus without complication ([place])   . GERD (gastroesophageal reflux disease)   . History of chicken pox   . History of shingles   . Hyperlipidemia   . Hypertension   . Hypothyroidism   . OSA on CPAP   . Squamous cell carcinoma in situ    nose 2021   . 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 Tami Ribas, [facility] H/o     Family History: Family History  Problem Relation Age of Onset  . Myelodysplastic syndrome Mother   . Arthritis Mother   . Emphysema Father   . Alcohol abuse Father   . COPD Father   . Depression Father   . Early death Maternal Grandfather   . Heart disease Maternal Grandfather   . Stroke Maternal Grandfather     Social History: Social History   Socioeconomic History  . Marital status: Married    Spouse name: Not on file  . Number of children: Not on file  . Years of education: Not on file  . Highest education level: Not on file  Occupational History  . Not on file  Tobacco Use  . Smoking status: Never Smoker  . Smokeless tobacco: Never Used  Vaping Use  . Vaping Use: Never used  Substance and Sexual Activity  . Alcohol use: Never    Alcohol/week: 0.0 standard drinks  . Drug use: No  . Sexual activity: Yes  Other Topics Concern  . 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 Difficulty of Paying Living Expenses: Not hard at all  Food Insecurity: No Food Insecurity  . Worried About Charity fundraiser in the Last Year: Never true  . Ran Out of Food in the Last Year: Never true  Transportation Needs: No Transportation Needs  . Lack of Transportation (Medical): No  . Lack of Transportation (Non-Medical): No  Physical Activity: Unknown  . Days of Exercise per Week: 0  days  . Minutes of Exercise per Session: Not on file  Stress: No Stress Concern Present  . Feeling of Stress : Not at all  Social Connections:   . Frequency of Communication with Friends and Family: Not on file  . Frequency of Social Gatherings with Friends and Family: Not on file  . Attends Religious Services: Not on file  . Active Member of Clubs or Organizations: Not on file  . Attends Archivist Meetings: Not on file  . Marital Status: Not on file  Intimate Partner Violence: Not At Risk  . Fear of Current or Ex-Partner: No  . Emotionally Abused: No  . Physically Abused: No  . Sexually Abused: No    Vital  Signs: Blood pressure (!) 148/80, pulse (!) 59, temperature 98 F (36.7 C), resp. rate 16, height 5\' 10"  (1.778 m), weight 250 lb 12.8 oz (113.8 kg), SpO2 97 %.  Examination: General Appearance: The patient is well-developed, well-nourished, and in no distress. Skin: Gross inspection of skin unremarkable. Head: normocephalic, no gross deformities. Eyes: no gross deformities noted. ENT: ears appear grossly normal no exudates. Neck: Supple. No thyromegaly. No LAD. Respiratory: clear bilaterally. Cardiovascular: Normal S1 and S2 without murmur or rub. Extremities: No cyanosis. pulses are equal. Neurologic: Alert and oriented. No involuntary movements.  LABS: Recent Results (from the past 2160 hour(s))  Surgical pathology     Status: None   Collection Time: 01/01/20 12:00 AM  Result Value Ref Range   SURGICAL PATHOLOGY      Surgical Pathology CASE: [id number] PATIENT: Lamona Curl Surgical Pathology Report     Specimen Submitted: A. 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 Left tongue biopsy in 2019 showed moderate dysplasia.     DIAGNOSIS: A. TONGUE; BIOPSY: - SQUAMOUS HYPERPLASIA AND SUBEPITHELIAL FIBROSIS. - NEGATIVE FOR DYSPLASIA AND MALIGNANCY.  Comment: The  features suggest an irritation fibroma although the surface is more bumpy than usual. This sample shows no ulceration, inflammation, or viral cytopathic effects. Correlation with the history and clinical appearance is recommended.  GROSS DESCRIPTION: A. Labeled: Tongue biopsy Received: Formalin Tissue fragment(s): 2 Size: Range from 0.4-0.6 cm Description: Received are 2 fragments of tan soft tissue.  The presumed resection margins are differentially inked. Entirely submitted in 1 cassette.    Final Diagnosis performed by Lorelle Formosa y, MD.   Electronically signed 01/03/2020 6:41:37PM The electronic signature indicates that the named Attending Pathologist has evaluated the specimen Technical component performed at [facility], [address] [place], [place] [address] Lab: [phone] Dir: Rush Farmer, MD, MMM  Professional component performed at [facility], [facility], [place], [place], [place] [address] Lab: [phone] Dir: Dellia Nims. Rubinas, MD     Radiology: US Venous Img Lower Unilateral Left  Result Date: 11/06/2019 CLINICAL DATA:  History of left lower extremity DVT (nonocclusive thrombus involving the left common femoral vein, saphenofemoral junction and short segment of the left femoral vein), currently on anticoagulation. Evaluate for acute or chronic DVT. EXAM: LEFT LOWER EXTREMITY VENOUS DOPPLER ULTRASOUND TECHNIQUE: Gray-scale sonography with graded compression, as well as color Doppler and duplex ultrasound were performed to evaluate the lower extremity deep venous systems from the level of the common femoral vein and including the common femoral, femoral, profunda femoral, popliteal and calf veins including the posterior tibial, peroneal and gastrocnemius veins when visible. The superficial great saphenous vein was also interrogated. Spectral Doppler was utilized to evaluate flow at rest and with distal augmentation maneuvers in the common femoral,  femoral and popliteal veins. COMPARISON:  Left lower extremity venous Doppler ultrasound-07/04/2019; CT abdomen pelvis-07/04/2019 FINDINGS: Contralateral Common Femoral Vein: Respiratory phasicity is normal and symmetric with the symptomatic side. No evidence of thrombus. Normal compressibility. Common Femoral Vein: No evidence of acute or chronic thrombus. Normal compressibility, respiratory phasicity and response to augmentation. Saphenofemoral Junction: No evidence of acute or chronic thrombus. Normal compressibility and flow on color Doppler imaging. Profunda Femoral Vein: No evidence of thrombus. Normal compressibility and flow on color Doppler imaging. Femoral Vein: No evidence of acute or chronic thrombus. Normal compressibility, respiratory phasicity and response to augmentation. Popliteal Vein: No evidence of thrombus. Normal compressibility, respiratory phasicity and  response to augmentation. Calf Veins: No evidence of thrombus. Normal compressibility and flow on color Doppler imaging. Superficial Great Saphenous Vein: No evidence of thrombus. Normal compressibility. Venous Reflux:  None. Other Findings:  None. IMPRESSION: No evidence of acute or chronic DVT/SVT within the left lower extremity with special attention paid to the left common femoral vein, the saphenofemoral junction and proximal aspect of the left femoral vein. Electronically Signed   By: Sandi Mariscal M.D.   On: 11/06/2019 11:23    No results found.  No results found.    Assessment and Plan: Patient Active Problem List   Diagnosis Date Noted  . Mitral valve insufficiency 12/22/2019  . Morbid obesity ([place]) 12/22/2019  . Obesity (BMI 30-39.9) 11/15/2019  . H/O total knee replacement, 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  . Arthritis of right knee 11/15/2019  . Benign prostatic hyperplasia with nocturia 11/15/2019  . Squamous cell carcinoma in situ   . Actinic keratoses   . Erectile dysfunction due to arterial insufficiency 04/24/2019  . Primary  osteoarthritis of left knee 11/22/2018  . Gastroesophageal reflux disease without esophagitis 03/21/2018  . History of skin cancer 03/21/2018  . History of tongue cancer 03/21/2018  . OSA on CPAP 03/21/2018  . Diarrhea 03/21/2018  . Hypothyroidism 03/21/2018  . Chronic low back pain 03/14/2018  . Coronary artery disease of native artery of native heart with stable angina pectoris ([place]) 12/22/2017  . Essential hypertension 12/22/2017  . Hyperlipidemia LDL goal <70 12/22/2017  . 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 Cancer of ventral surface of tongue ([place]) 11/04/2015  . Malignant neoplasm of tongue, tip and lateral border ([place]) 11/04/2015  . Primary osteoarthritis of both knees 05/13/2014  . Other intervertebral disc displacement, lumbar region 01/16/2014  . Neuritis or radiculitis due to rupture of lumbar intervertebral disc 01/16/2014  . Parapelvic renal cyst 12/18/2013  . Flank pain 11/30/2013  . Arthritis, degenerative 03/12/2013  . Diabetes mellitus ([place]) 03/12/2013  . Increased frequency of urination 03/12/2013  . Slowing of urinary stream 03/12/2013  . Urinary hesitancy 03/12/2013  . Benign prostatic hyperplasia with urinary obstruction 03/06/2012    1. OSA on CPAP Good relief of symptoms, continue to use cpap as directed.   2. Essential hypertension Controlled, continue to monitor.   3. Gastroesophageal reflux disease without esophagitis Stable, continue current management.   General Counseling: I have discussed the findings of the evaluation and examination with Tony Lawrence.  I have also discussed any further diagnostic evaluation thatmay be needed or ordered today. Tony Lawrence verbalizes understanding of the findings of todays visit. We also reviewed his medications today and discussed drug interactions and side effects including but not limited excessive drowsiness and altered mental states. We also discussed that there is always a risk not just to him but also people around him. he has  been encouraged to call the office with any questions or concerns that should arise related to todays visit.  No orders of the defined types were placed in this encounter.    Time spent: 25 This patient was seen by Orson Gear AGNP-C in Collaboration with Dr. Devona Konig as a part of collaborative care agreement.   I have personally obtained a history, examined the patient, evaluated laboratory and imaging results, formulated the assessment and plan and placed orders.    Allyne Gee, MD [facility] Pulmonary and Critical Care Sleep medicine

## 2020-01-11 DIAGNOSIS — M5416 Radiculopathy, lumbar region: Secondary | ICD-10-CM | POA: Diagnosis not present

## 2020-01-11 DIAGNOSIS — M5136 Other intervertebral disc degeneration, lumbar region: Secondary | ICD-10-CM | POA: Diagnosis not present

## 2020-01-11 DIAGNOSIS — M5126 Other intervertebral disc displacement, lumbar region: Secondary | ICD-10-CM | POA: Diagnosis not present

## 2020-01-14 DIAGNOSIS — M48062 Spinal stenosis, lumbar region with neurogenic claudication: Secondary | ICD-10-CM | POA: Diagnosis not present

## 2020-01-14 DIAGNOSIS — M5416 Radiculopathy, lumbar region: Secondary | ICD-10-CM | POA: Diagnosis not present

## 2020-01-14 DIAGNOSIS — M5136 Other intervertebral disc degeneration, lumbar region: Secondary | ICD-10-CM | POA: Diagnosis not present

## 2020-01-28 LAB — HM DIABETES EYE EXAM

## 2020-01-30 ENCOUNTER — Ambulatory Visit: Payer: Medicare Other

## 2020-02-04 ENCOUNTER — Telehealth: Payer: Self-pay | Admitting: Urology

## 2020-02-04 NOTE — Telephone Encounter (Signed)
Pt has follow up w/Stoio[facility] in December.  He is having an issue with genital yeast infection and [facility] gave him Nystatin triamcinolone Acetonide ointment to use.  The cream is a couple of years old and pt wanted to know if it was O.K. to use or if he would call in some more to [facility] [employer].

## 2020-02-05 MED ORDER — CLOTRIMAZOLE-BETAMETHASONE 1-0.05 % EX CREA: 1.0000 "application " | TOPICAL_CREAM | Freq: Two times a day (BID) | CUTANEOUS | 0 refills | Status: DC

## 2020-02-06 ENCOUNTER — Ambulatory Visit: Payer: Medicare Other

## 2020-02-07 ENCOUNTER — Ambulatory Visit (INDEPENDENT_AMBULATORY_CARE_PROVIDER_SITE_OTHER): Payer: Medicare Other

## 2020-02-07 ENCOUNTER — Other Ambulatory Visit: Payer: Medicare Other

## 2020-02-07 ENCOUNTER — Other Ambulatory Visit: Payer: Self-pay

## 2020-02-07 DIAGNOSIS — I340 Nonrheumatic mitral (valve) insufficiency: Secondary | ICD-10-CM

## 2020-02-07 DIAGNOSIS — R011 Cardiac murmur, unspecified: Secondary | ICD-10-CM | POA: Diagnosis not present

## 2020-02-07 DIAGNOSIS — E119 Type 2 diabetes mellitus without complications: Secondary | ICD-10-CM | POA: Diagnosis not present

## 2020-02-07 LAB — ECHOCARDIOGRAM COMPLETE
AR max vel: 3.62 cm2
AV Area VTI: 3.63 cm2
AV Area mean vel: 3.47 cm2
AV Mean grad: 7.5 mmHg
AV Peak grad: 15.2 mmHg
Ao pk vel: 1.95 m/s
Area-P 1/2: 2.19 cm2
Calc EF: 66 %
P 1/2 time: 424 msec
Single Plane A2C EF: 66.1 %
Single Plane A4C EF: 65.2 %

## 2020-02-11 ENCOUNTER — Telehealth: Payer: Self-pay | Admitting: *Deleted

## 2020-02-11 DIAGNOSIS — I7789 Other specified disorders of arteries and arterioles: Secondary | ICD-10-CM

## 2020-02-11 NOTE — Telephone Encounter (Signed)
-----   Message from Nelva Bush, MD sent at 02/08/2020  7:33 AM EDT ----- Please let Mr. Hensch know that his echocardiogram shows that his heart is contracting normally.  Mild leakage of the aortic valve is noted, as well as thickening of the mitral valve and mild enlargement of the ascending aorta.  These are chronic changes, similar to his last echocardiogram.  I recommend that we obtain a CTA of the chest to reevaluate his aorta shortly before his follow-up visit next year.  I think it is fine for Mr. Huster to proceed with his upcoming knee surgery.  We will complete his preop form and forward it to his surgeon.

## 2020-02-11 NOTE — Telephone Encounter (Signed)
CTA chest entered.  Results released to My Chart.

## 2020-02-11 NOTE — Telephone Encounter (Signed)
The patient has been notified of the result and verbalized understanding.  All questions (if any) were answered. [place], RN 02/11/2020 2:04 PM

## 2020-02-12 ENCOUNTER — Encounter: Payer: Self-pay | Admitting: Internal Medicine

## 2020-02-12 DIAGNOSIS — D044 Carcinoma in situ of skin of scalp and neck: Secondary | ICD-10-CM | POA: Diagnosis not present

## 2020-02-14 DIAGNOSIS — M5126 Other intervertebral disc displacement, lumbar region: Secondary | ICD-10-CM | POA: Diagnosis not present

## 2020-02-14 DIAGNOSIS — M5136 Other intervertebral disc degeneration, lumbar region: Secondary | ICD-10-CM | POA: Diagnosis not present

## 2020-02-14 DIAGNOSIS — M5416 Radiculopathy, lumbar region: Secondary | ICD-10-CM | POA: Diagnosis not present

## 2020-02-20 ENCOUNTER — Ambulatory Visit (INDEPENDENT_AMBULATORY_CARE_PROVIDER_SITE_OTHER): Payer: Medicare Other

## 2020-02-20 VITALS — Ht 70.0 in | Wt 250.0 lb

## 2020-02-20 DIAGNOSIS — Z0000 Encounter for general adult medical examination without abnormal findings: Secondary | ICD-10-CM | POA: Diagnosis not present

## 2020-02-20 NOTE — Patient Instructions (Addendum)
Tony Lawrence , Thank you for taking time to come for your Medicare Wellness Visit. I appreciate your ongoing commitment to your health goals. Please review the following plan we discussed and let me know if I can assist you in the future.   These are the goals we discussed: Goals    . Follow up with Primary Care Provider       This is a list of the screening recommended for you and due dates:  Health Maintenance  Topic Date Due  . Flu Shot  11/25/2019  . Complete foot exam   02/28/2020  . Hemoglobin A1C  03/14/2020  . Eye exam for diabetics  02/10/2021  . Colon Cancer Screening  05/25/2027  . Tetanus Vaccine  08/01/2028  . COVID-19 Vaccine  Completed  .  Hepatitis C: One time screening is recommended by Center for Disease Control  (CDC) for  adults born from [birth year] through 1965.   Completed  . Pneumonia vaccines  Completed    Immunizations Immunization History  Administered Date(s) Administered  . Influenza Inj Mdck Quad Pf 01/11/2018  . Influenza-Unspecified 02/10/2016, 01/25/2019, 02/23/2019  . PFIZER SARS-COV-2 Vaccination 06/09/2019, 07/03/2019  . Pneumococcal Polysaccharide-23 03/01/2019  . Tdap 08/02/2018  . Zoster Recombinat (Shingrix) 12/01/2018, 02/07/2019   Keep all routine maintenance appointments.   Next scheduled lab 04/07/20 @ 8:45  Follow up 05/20/20 @ 1:30  Advanced directives: completed, on file  Conditions/risks identified: none new  Follow up in one year for your annual wellness visit.  Preventive Care 74 Years and Older, Male Preventive care refers to lifestyle choices and visits with your health care provider that can promote health and wellness. What does preventive care include?  A yearly physical exam. This is also called an annual well check.  Dental exams once or twice a year.  Routine eye exams. Ask your health care provider how often you should have your eyes checked.  Personal lifestyle choices, including:  Daily care of your teeth  and gums.  Regular physical activity.  Eating a healthy diet.  Avoiding tobacco and drug use.  Limiting alcohol use.  Practicing safe sex.  Taking low doses of aspirin every day.  Taking vitamin and mineral supplements as recommended by your health care provider. What happens during an annual well check? The services and screenings done by your health care provider during your annual well check will depend on your age, overall health, lifestyle risk factors, and family history of disease. Counseling  Your health care provider may ask you questions about your:  Alcohol use.  Tobacco use.  Drug use.  Emotional well-being.  Home and relationship well-being.  Sexual activity.  Eating habits.  History of falls.  Memory and ability to understand (cognition).  Work and work Statistician. Screening  You may have the following tests or measurements:  Height, weight, and BMI.  Blood pressure.  Lipid and cholesterol levels. These may be checked every 5 years, or more frequently if you are over 53 years old.  Skin check.  Lung cancer screening. You may have this screening every year starting at age 69 if you have a 30-pack-year history of smoking and currently smoke or have quit within the past 15 years.  Fecal occult blood test (FOBT) of the stool. You may have this test every year starting at age 76.  Flexible sigmoidoscopy or colonoscopy. You may have a sigmoidoscopy every 5 years or a colonoscopy every 10 years starting at age 58.  Prostate cancer screening. Recommendations  will vary depending on your family history and other risks.  Hepatitis C blood test.  Hepatitis B blood test.  Sexually transmitted disease (STD) testing.  Diabetes screening. This is done by checking your blood sugar (glucose) after you have not eaten for a while (fasting). You may have this done every 1-3 years.  Abdominal aortic aneurysm (AAA) screening. You may need this if you are a  current or former smoker.  Osteoporosis. You may be screened starting at age 76 if you are at high risk. Talk with your health care provider about your test results, treatment options, and if necessary, the need for more tests. Vaccines  Your health care provider may recommend certain vaccines, such as:  Influenza vaccine. This is recommended every year.  Tetanus, diphtheria, and acellular pertussis (Tdap, Td) vaccine. You may need a Td booster every 10 years.  Zoster vaccine. You may need this after age 66.  Pneumococcal 13-valent conjugate (PCV13) vaccine. One dose is recommended after age 58.  Pneumococcal polysaccharide (PPSV23) vaccine. One dose is recommended after age 31. Talk to your health care provider about which screenings and vaccines you need and how often you need them. This information is not intended to replace advice given to you by your health care provider. Make sure you discuss any questions you have with your health care provider. Document Released: 05/09/2015 Document Revised: 12/31/2015 Document Reviewed: 02/11/2015 Elsevier Interactive Patient Education  2017 [place] Prevention in the Home Falls can cause injuries. They can happen to people of all ages. There are many things you can do to make your home safe and to help prevent falls. What can I do on the outside of my home?  Regularly fix the edges of walkways and driveways and fix any cracks.  Remove anything that might make you trip as you walk through a door, such as a raised step or threshold.  Trim any bushes or trees on the path to your home.  Use bright outdoor lighting.  Clear any walking paths of anything that might make someone trip, such as rocks or tools.  Regularly check to see if handrails are loose or broken. Make sure that both sides of any steps have handrails.  Any raised decks and porches should have guardrails on the edges.  Have any leaves, snow, or ice cleared  regularly.  Use sand or salt on walking paths during winter.  Clean up any spills in your garage right away. This includes oil or grease spills. What can I do in the bathroom?  Use night lights.  Install grab bars by the toilet and in the tub and shower. Do not use towel bars as grab bars.  Use non-skid mats or decals in the tub or shower.  If you need to sit down in the shower, use a plastic, non-slip stool.  Keep the floor dry. Clean up any water that spills on the floor as soon as it happens.  Remove soap buildup in the tub or shower regularly.  Attach bath mats securely with double-sided non-slip rug tape.  Do not have throw rugs and other things on the floor that can make you trip. What can I do in the bedroom?  Use night lights.  Make sure that you have a light by your bed that is easy to reach.  Do not use any sheets or blankets that are too big for your bed. They should not hang down onto the floor.  Have a firm chair that  has side arms. You can use this for support while you get dressed.  Do not have throw rugs and other things on the floor that can make you trip. What can I do in the kitchen?  Clean up any spills right away.  Avoid walking on wet floors.  Keep items that you use a lot in easy-to-reach places.  If you need to reach something above you, use a strong step stool that has a grab bar.  Keep electrical cords out of the way.  Do not use floor polish or wax that makes floors slippery. If you must use wax, use non-skid floor wax.  Do not have throw rugs and other things on the floor that can make you trip. What can I do with my stairs?  Do not leave any items on the stairs.  Make sure that there are handrails on both sides of the stairs and use them. Fix handrails that are broken or loose. Make sure that handrails are as long as the stairways.  Check any carpeting to make sure that it is firmly attached to the stairs. Fix any carpet that is loose  or worn.  Avoid having throw rugs at the top or bottom of the stairs. If you do have throw rugs, attach them to the floor with carpet tape.  Make sure that you have a light switch at the top of the stairs and the bottom of the stairs. If you do not have them, ask someone to add them for you. What else can I do to help prevent falls?  Wear shoes that:  Do not have high heels.  Have rubber bottoms.  Are comfortable and fit you well.  Are closed at the toe. Do not wear sandals.  If you use a stepladder:  Make sure that it is fully opened. Do not climb a closed stepladder.  Make sure that both sides of the stepladder are locked into place.  Ask someone to hold it for you, if possible.  Clearly mark and make sure that you can see:  Any grab bars or handrails.  First and last steps.  Where the edge of each step is.  Use tools that help you move around (mobility aids) if they are needed. These include:  Canes.  Walkers.  Scooters.  Crutches.  Turn on the lights when you go into a dark area. Replace any light bulbs as soon as they burn out.  Set up your furniture so you have a clear path. Avoid moving your furniture around.  If any of your floors are uneven, fix them.  If there are any pets around you, be aware of where they are.  Review your medicines with your doctor. Some medicines can make you feel dizzy. This can increase your chance of falling. Ask your doctor what other things that you can do to help prevent falls. This information is not intended to replace advice given to you by your health care provider. Make sure you discuss any questions you have with your health care provider. Document Released: 02/06/2009 Document Revised: 09/18/2015 Document Reviewed: 05/17/2014 Elsevier Interactive Patient Education  2017 [employer].

## 2020-02-20 NOTE — Progress Notes (Signed)
Subjective:   Tony Lawrence is a 74 y.o. male who presents for Medicare Annual/Subsequent preventive examination.  Review of Systems    No ROS.  Medicare Wellness Virtual Visit.    Cardiac Risk Factors include: advanced age (>2men, >7 women);hypertension;male gender;diabetes mellitus     Objective:    Today's Vitals   02/20/20 1332  Weight: 250 lb (113.4 kg)  Height: 5\' 10"  (1.778 m)   Body mass index is 35.87 kg/m.  Advanced Directives 02/20/2020 07/04/2019 01/29/2019 05/24/2017 11/21/2015 11/07/2015 11/04/2015  Does Patient Have a Medical Advance Directive? Yes No Yes Yes Yes Yes Yes  Type of Paramedic of [place];Living will - [place];Living will [facility];Living will Living will;Healthcare Power of [place];Living will -  Does patient want to make changes to medical advance directive? No - Patient declined - No - Patient declined - - - -  Copy of [place] in Chart? Yes - validated most recent copy scanned in chart (See row information) - Yes - validated most recent copy scanned in chart (See row information) Yes - No - copy requested -    Current Medications (verified) Outpatient Encounter Medications as of 02/20/2020  Medication Sig  . Acetaminophen (TYLENOL PO) Take by mouth.  Marland Kitchen aspirin 81 MG tablet Take 81 mg by mouth daily.  Marland Kitchen atorvastatin (LIPITOR) 20 MG tablet Take 1 tablet (20 mg total) by mouth daily at 6 PM. At night  . bisoprolol-hydrochlorothiazide (ZIAC) 2.5-6.25 MG tablet Take 1 tablet by mouth daily.  . clopidogrel (PLAVIX) 75 MG tablet Take 75 mg by mouth daily.  . clotrimazole-betamethasone (LOTRISONE) cream Apply 1 application topically 2 (two) times daily.  . empagliflozin (JARDIANCE) 25 MG TABS tablet Take 25 mg by mouth daily.  Marland Kitchen glimepiride (AMARYL) 1 MG tablet TAKE ONE TABLET BY MOUTH EVERY MORNING AND TAKE ONE TABLET BY MOUTH WITH SUPPER    . glucose blood test strip E 11.9 qd Once Daily one touch  . isosorbide mononitrate (IMDUR) 30 MG 24 hr tablet TAKE ONE TABLET BY MOUTH EVERY MORNING AND TWO TABLETS EVERY EVENING  . Lancet Device MISC 1 Device by Does not apply route as directed.  . Lancets (ONETOUCH ULTRASOFT) lancets Qd E 11.9 Once daily  . levothyroxine (SYNTHROID) 200 MCG tablet Take 1 tablet (200 mcg total) by mouth daily. In am on empty stomach  . liothyronine (CYTOMEL) 5 MCG tablet Take 0.5 tablets (2.5 mcg total) by mouth every other day.  . metFORMIN (GLUCOPHAGE) 1000 MG tablet Take 1 tablet (1,000 mg total) by mouth daily with breakfast.  . Multiple Vitamins-Minerals (CENTRUM SILVER 50+MEN PO) Take 1 tablet by mouth every morning.   Marland Kitchen omeprazole (PRILOSEC) 40 MG capsule Take 1 capsule (40 mg total) by mouth daily.  . ramipril (ALTACE) 10 MG capsule Take 2 capsules (20 mg total) by mouth daily.   No facility-administered encounter medications on file as of 02/20/2020.    Allergies (verified) Penicillins   History: Past Medical History:  Diagnosis Date  . Actinic keratoses   . Allergy   . Arthritis    low back pain s/p shots and blocks, knee pain  . BCC (basal cell carcinoma of skin)    forehead and chest Dr. Evorn Gong q 6 months   . CAD (coronary artery disease)   . Complication of anesthesia    SEVERE SORE THROAT AFTER BIOPSY 2010  . Coronary artery disease  Chronic total occlusion of mid LAD  . Diabetes mellitus without complication ([place])   . GERD (gastroesophageal reflux disease)   . History of chicken pox   . History of shingles   . Hyperlipidemia   . Hypertension   . Hypothyroidism   . OSA on CPAP   . Squamous cell carcinoma in situ    nose 2021   . 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 Tami Ribas, [facility]    Past Surgical History:  Procedure Laterality Date  . 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 CARDIAC CATHETERIZATION N/A 11/10/2015    Procedure: Left Heart Cath and Coronary Angiography;  Surgeon: Wellington Hampshire, MD;  Location: [facility];  Service: Cardiovascular;  Laterality: N/A;  . COLONOSCOPY  05/2006  . COLONOSCOPY WITH PROPOFOL N/A 05/24/2017   Procedure: COLONOSCOPY WITH PROPOFOL;  Surgeon: Lollie Sails, MD;  Location: [facility] ENDOSCOPY;  Service: Endoscopy;  Laterality: N/A;  . EXCISION OF TONGUE LESION N/A 11/11/2015   Procedure: EXCISION OF TONGUE LESION/ WITH FROZEN SECTION;  Surgeon: Beverly Gust, MD;  Location: [facility] ORS;  Service: ENT;  Laterality: N/A;  . REPLACEMENT TOTAL KNEE Left    Family History  Problem Relation Age of Onset  . Myelodysplastic syndrome Mother   . Arthritis Mother   . Emphysema Father   . Alcohol abuse Father   . COPD Father   . Depression Father   . Early death Maternal Grandfather   . Heart disease Maternal Grandfather   . Stroke Maternal Grandfather    Social History   Socioeconomic History  . Marital status: Married    Spouse name: Not on file  . Number of children: Not on file  . Years of education: Not on file  . Highest education level: Not on file  Occupational History  . Not on file  Tobacco Use  . Smoking status: Never Smoker  . Smokeless tobacco: Never Used  Vaping Use  . Vaping Use: Never used  Substance and Sexual Activity  . Alcohol use: Never    Alcohol/week: 0.0 standard drinks  . Drug use: No  . Sexual activity: Yes  Other Topics Concern  . 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 Difficulty of Paying Living Expenses: Not hard at all  Food Insecurity: No Food Insecurity  . Worried About Charity fundraiser in the Last Year: Never true  . Ran Out of Food in the Last Year: Never true  Transportation Needs: No Transportation  Needs  . Lack of Transportation (Medical): No  . Lack of Transportation (Non-Medical): No  Physical Activity:   . Days of Exercise per Week: Not on file  . Minutes of Exercise per Session: Not on file  Stress: No Stress Concern Present  . Feeling of Stress : Not at all  Social Connections:   . Frequency of Communication with Friends and Family: Not on file  . Frequency of Social Gatherings with Friends and Family: Not on file  . Attends Religious Services: Not on file  . Active Member of Clubs or Organizations: Not on file  . Attends Archivist Meetings: Not on  file  . Marital Status: Not on file    Tobacco Counseling Counseling given: Not Answered   Clinical Intake:  Pre-visit preparation completed: Yes    Nutrition Risk Assessment: Does the patient have any non-healing wounds?  No  Has the patient had any unintentional weight loss or weight gain?  No   Diabetes: Is the patient diabetic?  Yes  If diabetic, was a CBG obtained today?  No  Did the patient bring in their glucometer from home?  No . Virtual visit.  Next scheduled lab 04/07/20 @ 8:45  Financial Strains and Diabetes Management: Are you having any financial strains with the device, your supplies or your medication? No .  Does the patient want to be seen by Chronic Care Management for management of their diabetes?  No  Would the patient like to be referred to a Nutritionist or for Diabetic Management?  No   Diabetic Exams: Diabetic Foot Exam: Completed 02/28/19    Diabetes: Yes (Followed by pcp)  How often do you need to have someone help you when you read instructions, pamphlets, or other written materials from your doctor or pharmacy?: 1 - Never   Interpreter Needed?: No      Activities of Daily Living In your present state of health, do you have any difficulty performing the following activities: 02/20/2020  Hearing? N  Vision? N  Difficulty concentrating or making decisions? N  Walking  or climbing stairs? N  Dressing or bathing? N  Doing errands, shopping? N  Preparing Food and eating ? N  Using the Toilet? N  In the past six months, have you accidently leaked urine? N  Do you have problems with loss of bowel control? N  Managing your Medications? N  Managing your Finances? N  Housekeeping or managing your Housekeeping? N  Some recent data might be hidden    Patient Care Team: McLean-Scocuzza, Nino Glow, MD as PCP - General (Internal Medicine) End, Harrell Gave, MD as PCP - Cardiology (Cardiology)  Indicate any recent Medical Services you may have received from other than [facility] providers in the past year (date may be approximate).     Assessment:   This is a routine wellness examination for Remus.  I connected with A[place] today by telephone and verified that I am speaking with the correct person using two identifiers. Location patient: home Location provider: work Persons participating in the virtual visit: patient, Marine scientist.    I discussed the limitations, risks, security and privacy concerns of performing an evaluation and management service by telephone and the availability of in person appointments. The patient expressed understanding and verbally consented to this telephonic visit.    Interactive audio and video telecommunications were attempted between this provider and patient, however failed, due to patient having technical difficulties OR patient did not have access to video capability.  We continued and completed visit with audio only.  Some vital signs may be absent or patient reported.   Hearing/Vision screen  Hearing Screening   125Hz  250Hz  500Hz  1000Hz  2000Hz  3000Hz  4000Hz  6000Hz  8000Hz   Right ear:           Left ear:           Comments: Patient is able to hear conversational tones without difficulty. No issues reported.  Vision Screening Comments: Wears corrective lenses when reading. Visual acuity not assessed, virtual visit. They have seen their  ophthalmologist in the last 12 months. No retinopathy reported.   Dietary issues and exercise activities discussed: Current Exercise  Habits: Home exercise routine, Type of exercise: calisthenics, Intensity: Mild Healthy diet Good water intake  Goals    . Follow up with Primary Care Provider      Depression Screen PHQ 2/9 Scores 02/20/2020 11/14/2019 07/18/2019 01/29/2019 03/14/2018 01/11/2018 01/02/2018  PHQ - 2 Score 0 0 0 0 0 0 0    Fall Risk Fall Risk  02/20/2020 11/14/2019 07/18/2019 01/29/2019 03/14/2018  Falls in the past year? 0 0 0 0 0  Comment - - - - -  Number falls in past yr: 0 0 0 - -  Injury with Fall? - 0 0 - -  Follow up Falls evaluation completed Falls evaluation completed Falls evaluation completed - -   Handrails in use when climbing stairs? Yes Home free of loose throw rugs in walkways, pet beds, electrical cords, etc? Yes  Adequate lighting in your home to reduce risk of falls? Yes   ASSISTIVE DEVICES UTILIZED TO PREVENT FALLS: Use of a cane, walker or w/c? No   TIMED UP AND GO: Was the test performed? No . Virtual visit.   Cognitive Function: Patient is alert and oriented x3.  Denies difficulty focusing, making decisions, memory loss.  Enjoys reading for brain stimuli.   MMSE - Mini Mental State Exam 01/11/2018  Orientation to time 5  Orientation to Place 5  Registration 3  Attention/ Calculation 5  Recall 3  Language- name 2 objects 2  Language- repeat 1  Language- follow 3 step command 3  Language- read & follow direction 1  Write a sentence 1  Copy design 1  Total score 30     6CIT Screen 01/29/2019  What Year? 0 points  What month? 0 points  What time? 0 points  Count back from 20 0 points  Months in reverse 0 points  Repeat phrase 0 points  Total Score 0   Immunizations Immunization History  Administered Date(s) Administered  . Influenza Inj Mdck Quad Pf 01/11/2018  . Influenza-Unspecified 02/10/2016, 01/25/2019, 02/23/2019  . PFIZER  SARS-COV-2 Vaccination 06/09/2019, 07/03/2019  . Pneumococcal Polysaccharide-23 03/01/2019  . Tdap 08/02/2018  . Zoster Recombinat (Shingrix) 12/01/2018, 02/07/2019    Health Maintenance Health Maintenance  Topic Date Due  . INFLUENZA VACCINE  11/25/2019  . FOOT EXAM  02/28/2020  . HEMOGLOBIN A1C  03/14/2020  . OPHTHALMOLOGY EXAM  02/10/2021  . COLONOSCOPY  05/25/2027  . TETANUS/TDAP  08/01/2028  . COVID-19 Vaccine  Completed  . Hepatitis C Screening  Completed  . PNA vac Low Risk Adult  Completed    Colorectal cancer screening: Completed 05/24/17. Repeat every 10 years  Lung Cancer Screening: (Low Dose CT Chest recommended if Age 24-80 years, 30 pack-year currently smoking OR have quit w/in 15years.) does not qualify.    Vision Screening: Recommended annual ophthalmology exams for early detection of glaucoma and other disorders of the eye. Is the patient up to date with their annual eye exam?  Yes  Who is the provider or what is the name of the office in which the patient attends annual eye exams? [facility], Dr. George Ina. Notes exam results to be forwarded to pcp.  Dental Screening: Recommended annual dental exams for proper oral hygiene  Community Resource Referral / Chronic Care Management: CRR required this visit?  No   CCM required this visit?  No      Plan:   Keep all routine maintenance appointments.   Next scheduled lab 04/07/20 @ 8:45  Follow up 05/20/20 @ 1:30  I  have personally reviewed and noted the following in the patient's chart:   . Medical and social history . Use of alcohol, tobacco or illicit drugs  . Current medications and supplements . Functional ability and status . Nutritional status . Physical activity . Advanced directives . List of other physicians . Hospitalizations, surgeries, and ER visits in previous 12 months . Vitals . Screenings to include cognitive, depression, and falls . Referrals and appointments  In addition, I  have reviewed and discussed with patient certain preventive protocols, quality metrics, and best practice recommendations. A written personalized care plan for preventive services as well as general preventive health recommendations were provided to patient via mychart.     Varney Biles, LPN   [id number]

## 2020-03-07 ENCOUNTER — Other Ambulatory Visit: Payer: Self-pay | Admitting: Internal Medicine

## 2020-03-12 DIAGNOSIS — Z23 Encounter for immunization: Secondary | ICD-10-CM | POA: Diagnosis not present

## 2020-03-14 ENCOUNTER — Other Ambulatory Visit: Payer: Self-pay | Admitting: Internal Medicine

## 2020-03-14 ENCOUNTER — Telehealth: Payer: Self-pay | Admitting: Internal Medicine

## 2020-03-14 DIAGNOSIS — E039 Hypothyroidism, unspecified: Secondary | ICD-10-CM

## 2020-03-14 DIAGNOSIS — I2510 Atherosclerotic heart disease of native coronary artery without angina pectoris: Secondary | ICD-10-CM

## 2020-03-14 DIAGNOSIS — I10 Essential (primary) hypertension: Secondary | ICD-10-CM

## 2020-03-14 DIAGNOSIS — K219 Gastro-esophageal reflux disease without esophagitis: Secondary | ICD-10-CM

## 2020-03-14 DIAGNOSIS — E119 Type 2 diabetes mellitus without complications: Secondary | ICD-10-CM

## 2020-03-14 MED ORDER — LIOTHYRONINE SODIUM 5 MCG PO TABS: 2.5000 ug | ORAL_TABLET | ORAL | 3 refills | Status: DC

## 2020-03-14 MED ORDER — RAMIPRIL 10 MG PO CAPS: 20.0000 mg | ORAL_CAPSULE | Freq: Every day | ORAL | 3 refills | Status: DC

## 2020-03-14 MED ORDER — RAMIPRIL 10 MG PO CAPS: 20.0000 mg | ORAL_CAPSULE | Freq: Every day | ORAL | 1 refills | Status: DC

## 2020-03-14 MED ORDER — ATORVASTATIN CALCIUM 20 MG PO TABS: 20.0000 mg | ORAL_TABLET | Freq: Every day | ORAL | 3 refills | Status: DC

## 2020-03-14 MED ORDER — OMEPRAZOLE 40 MG PO CPDR: 40.0000 mg | DELAYED_RELEASE_CAPSULE | Freq: Every day | ORAL | 1 refills | Status: DC

## 2020-03-14 MED ORDER — LIOTHYRONINE SODIUM 5 MCG PO TABS: 2.5000 ug | ORAL_TABLET | ORAL | 1 refills | Status: DC

## 2020-03-14 MED ORDER — GLIMEPIRIDE 1 MG PO TABS: ORAL_TABLET | ORAL | 3 refills | Status: DC

## 2020-03-14 MED ORDER — LEVOTHYROXINE SODIUM 200 MCG PO TABS: 200.0000 ug | ORAL_TABLET | Freq: Every day | ORAL | 1 refills | Status: DC

## 2020-03-14 MED ORDER — BISOPROLOL-HYDROCHLOROTHIAZIDE 2.5-6.25 MG PO TABS: 1.0000 | ORAL_TABLET | Freq: Every day | ORAL | 3 refills | Status: DC

## 2020-03-14 MED ORDER — OMEPRAZOLE 40 MG PO CPDR: 40.0000 mg | DELAYED_RELEASE_CAPSULE | Freq: Every day | ORAL | 3 refills | Status: DC

## 2020-03-14 NOTE — Telephone Encounter (Signed)
plavix and prilosec interfere making plavix less effective does he want to change to protonix or dexilant?

## 2020-03-14 NOTE — Telephone Encounter (Signed)
Pt needs refill on levothyroxine (SYNTHROID) 200 MCG tablet and liothyronine (CYTOMEL) 5 MCG tablet sent to [facility]

## 2020-03-14 NOTE — Telephone Encounter (Signed)
Medication sent in to preferred pharmacy per protocol.   

## 2020-03-14 NOTE — Telephone Encounter (Signed)
Pt also needs these refilled ramipril (ALTACE) 10 MG capsule and omeprazole (PRILOSEC) 40 MG capsule

## 2020-03-17 ENCOUNTER — Other Ambulatory Visit: Payer: Self-pay | Admitting: Internal Medicine

## 2020-03-17 DIAGNOSIS — K219 Gastro-esophageal reflux disease without esophagitis: Secondary | ICD-10-CM

## 2020-03-17 MED ORDER — PANTOPRAZOLE SODIUM 40 MG PO TBEC: 40.0000 mg | DELAYED_RELEASE_TABLET | Freq: Every day | ORAL | 3 refills | Status: DC

## 2020-03-17 NOTE — Telephone Encounter (Signed)
Patient is agreeable to change medication and does not have a preference.

## 2020-03-18 ENCOUNTER — Other Ambulatory Visit: Payer: Medicare Other

## 2020-04-03 DIAGNOSIS — D3702 Neoplasm of uncertain behavior of tongue: Secondary | ICD-10-CM | POA: Diagnosis not present

## 2020-04-07 ENCOUNTER — Other Ambulatory Visit (INDEPENDENT_AMBULATORY_CARE_PROVIDER_SITE_OTHER): Payer: Medicare Other

## 2020-04-07 ENCOUNTER — Other Ambulatory Visit: Payer: Self-pay

## 2020-04-07 DIAGNOSIS — I152 Hypertension secondary to endocrine disorders: Secondary | ICD-10-CM | POA: Diagnosis not present

## 2020-04-07 DIAGNOSIS — E039 Hypothyroidism, unspecified: Secondary | ICD-10-CM | POA: Diagnosis not present

## 2020-04-07 DIAGNOSIS — I824Z2 Acute embolism and thrombosis of unspecified deep veins of left distal lower extremity: Secondary | ICD-10-CM

## 2020-04-07 DIAGNOSIS — Z1389 Encounter for screening for other disorder: Secondary | ICD-10-CM

## 2020-04-07 DIAGNOSIS — Z01818 Encounter for other preprocedural examination: Secondary | ICD-10-CM | POA: Diagnosis not present

## 2020-04-07 DIAGNOSIS — E1159 Type 2 diabetes mellitus with other circulatory complications: Secondary | ICD-10-CM

## 2020-04-07 LAB — PROTIME-INR
INR: 1 ratio (ref 0.8–1.0)
Prothrombin Time: 11.7 s (ref 9.6–13.1)

## 2020-04-07 LAB — COMPREHENSIVE METABOLIC PANEL
ALT: 23 U/L (ref 0–53)
AST: 22 U/L (ref 0–37)
Albumin: 4.1 g/dL (ref 3.5–5.2)
Alkaline Phosphatase: 91 U/L (ref 39–117)
BUN: 10 mg/dL (ref 6–23)
CO2: 30 mEq/L (ref 19–32)
Calcium: 8.9 mg/dL (ref 8.4–10.5)
Chloride: 101 mEq/L (ref 96–112)
Creatinine, Ser: 1.06 mg/dL (ref 0.40–1.50)
GFR: 68.96 mL/min (ref >60.00)
Glucose, Bld: 106 mg/dL — ABNORMAL HIGH (ref 70–99)
Potassium: 3.9 mEq/L (ref 3.5–5.1)
Sodium: 140 mEq/L (ref 135–145)
Total Bilirubin: 0.9 mg/dL (ref 0.2–1.2)
Total Protein: 6.9 g/dL (ref 6.0–8.3)

## 2020-04-07 LAB — CBC WITH DIFFERENTIAL/PLATELET
Basophils Absolute: 0.1 10*3/uL (ref 0.0–0.1)
Basophils Relative: 0.8 % (ref 0.0–3.0)
Eosinophils Absolute: 0.1 10*3/uL (ref 0.0–0.7)
Eosinophils Relative: 1.4 % (ref 0.0–5.0)
HCT: 47.1 % (ref 39.0–52.0)
Hemoglobin: 15.7 g/dL (ref 13.0–17.0)
Lymphocytes Relative: 29.9 % (ref 12.0–46.0)
Lymphs Abs: 2 10*3/uL (ref 0.7–4.0)
MCHC: 33.4 g/dL (ref 30.0–36.0)
MCV: 89.5 fl (ref 78.0–100.0)
Monocytes Absolute: 0.7 10*3/uL (ref 0.1–1.0)
Monocytes Relative: 10.3 % (ref 3.0–12.0)
Neutro Abs: 3.8 10*3/uL (ref 1.4–7.7)
Neutrophils Relative %: 57.6 % (ref 43.0–77.0)
Platelets: 176 10*3/uL (ref 150.0–400.0)
RBC: 5.26 Mil/uL (ref 4.22–5.81)
RDW: 14.9 % (ref 11.5–15.5)
WBC: 6.6 10*3/uL (ref 4.0–10.5)

## 2020-04-07 LAB — LIPID PANEL
Cholesterol: 82 mg/dL (ref 0–200)
HDL: 25.2 mg/dL — ABNORMAL LOW (ref >39.00)
LDL Cholesterol: 26 mg/dL (ref 0–99)
NonHDL: 57.18
Total CHOL/HDL Ratio: 3
Triglycerides: 154 mg/dL — ABNORMAL HIGH (ref 0.0–149.0)
VLDL: 30.8 mg/dL (ref 0.0–40.0)

## 2020-04-07 LAB — TSH: TSH: 0.81 u[IU]/mL (ref 0.35–4.50)

## 2020-04-07 LAB — HEMOGLOBIN A1C: Hgb A1c MFr Bld: 7 % — ABNORMAL HIGH (ref 4.6–6.5)

## 2020-04-07 LAB — APTT: aPTT: 30.8 s (ref 23.4–32.7)

## 2020-04-11 ENCOUNTER — Telehealth: Payer: Self-pay | Admitting: Internal Medicine

## 2020-04-11 ENCOUNTER — Other Ambulatory Visit: Payer: Self-pay | Admitting: Internal Medicine

## 2020-04-11 DIAGNOSIS — Z1389 Encounter for screening for other disorder: Secondary | ICD-10-CM

## 2020-04-11 NOTE — Addendum Note (Signed)
Addended by: Leeanne Rio on: 04/11/2020 04:05 PM   Modules accepted: Orders

## 2020-04-11 NOTE — Telephone Encounter (Signed)
Called and Patient's wife answered. She states she can bring the Patient in on Monday. Patient scheduled for 04/14/20 at 9:45 am for a urine lab.

## 2020-04-11 NOTE — Telephone Encounter (Signed)
McLean-Scocuzza, Nino Glow, MD  Thressa Sheller, CMA We need urine for his pre op surgery basic urine  Thanks        Previous Messages   ----- Message -----  From: Leeanne Rio, CMA  Sent: 04/11/2020 11:43 AM EST  To: Nino Glow McLean-Scocuzza, MD   Unable to void I'm assuming, will need to collect.   ----- Message -----  From: McLean-Scocuzza, Nino Glow, MD  Sent: 04/10/2020  5:27 PM EST  To: Leeanne Rio, CMA, Monico Hoar, RT   Is urine resulted ?

## 2020-04-14 ENCOUNTER — Other Ambulatory Visit: Payer: Medicare Other

## 2020-04-14 ENCOUNTER — Other Ambulatory Visit: Payer: Self-pay

## 2020-04-14 DIAGNOSIS — Z1389 Encounter for screening for other disorder: Secondary | ICD-10-CM | POA: Diagnosis not present

## 2020-04-15 LAB — URINALYSIS, ROUTINE W REFLEX MICROSCOPIC
Bilirubin Urine: NEGATIVE
Hgb urine dipstick: NEGATIVE
Ketones, ur: NEGATIVE
Leukocytes,Ua: NEGATIVE
Nitrite: NEGATIVE
Protein, ur: NEGATIVE
Specific Gravity, Urine: 1.021 (ref 1.001–1.03)
pH: 5.5 (ref 5.0–8.0)

## 2020-04-16 ENCOUNTER — Telehealth: Payer: Self-pay

## 2020-04-16 NOTE — Telephone Encounter (Signed)
Faxed Preop care plan back to Dr Joelene Millin office @ [phone] on 04/16/20 for right total knee arthroplasty

## 2020-04-23 ENCOUNTER — Ambulatory Visit (INDEPENDENT_AMBULATORY_CARE_PROVIDER_SITE_OTHER): Payer: Medicare Other | Admitting: Urology

## 2020-04-23 ENCOUNTER — Encounter: Payer: Self-pay | Admitting: Urology

## 2020-04-23 ENCOUNTER — Telehealth: Payer: Self-pay | Admitting: Internal Medicine

## 2020-04-23 ENCOUNTER — Other Ambulatory Visit: Payer: Self-pay

## 2020-04-23 VITALS — BP 137/72 | HR 71 | Ht 70.0 in | Wt 245.0 lb

## 2020-04-23 DIAGNOSIS — Z87448 Personal history of other diseases of urinary system: Secondary | ICD-10-CM

## 2020-04-23 DIAGNOSIS — N401 Enlarged prostate with lower urinary tract symptoms: Secondary | ICD-10-CM

## 2020-04-23 DIAGNOSIS — Z87898 Personal history of other specified conditions: Secondary | ICD-10-CM

## 2020-04-23 DIAGNOSIS — N138 Other obstructive and reflux uropathy: Secondary | ICD-10-CM | POA: Diagnosis not present

## 2020-04-23 LAB — URINALYSIS, COMPLETE
Bilirubin, UA: NEGATIVE
Ketones, UA: NEGATIVE
Leukocytes,UA: NEGATIVE
Nitrite, UA: NEGATIVE
Protein,UA: NEGATIVE
RBC, UA: NEGATIVE
Specific Gravity, UA: 1.01 (ref 1.005–1.030)
Urobilinogen, Ur: 0.2 mg/dL (ref 0.2–1.0)
pH, UA: 5 (ref 5.0–7.5)

## 2020-04-23 LAB — MICROSCOPIC EXAMINATION
Bacteria, UA: NONE SEEN
RBC, Urine: NONE SEEN /hpf (ref 0–2)

## 2020-04-23 NOTE — Telephone Encounter (Signed)
Dr.Berger office called need the history/physical part for patient  Phone # [phone] Fax# [phone]

## 2020-04-23 NOTE — Progress Notes (Signed)
04/23/2020 1:25 PM   Deitra MayoRicky J Wendorf 1945/07/04 [id number]  Referring provider: McLean-Scocuzza, Pasty Spillersracy N, MD [address] [place],  [place] [id number]  Chief Complaint  Patient presents with  . Benign Prostatic Hypertrophy    HPI: 74 y.o. male presents for follow-up.   Last seen 07/06/2019 for cystoscopy after an episode of total gross painless hematuria  CTU showed 5 cm right parapelvic cyst  Cystoscopy remarkable for BPH with hypervascularity/friability  Stable LUTS, nocturia x3 which is not bothersome  Has sleep apnea on CPAP  He called in October requesting a refill of Mycolog for balanitis which resolved   PMH: Past Medical History:  Diagnosis Date  . Actinic keratoses   . Allergy   . Arthritis    low back pain s/p shots and blocks, knee pain  . BCC (basal cell carcinoma of skin)    forehead and chest Dr. Adolphus Birchwoodasher q 6 months   . CAD (coronary artery disease)   . Complication of anesthesia    SEVERE SORE THROAT AFTER BIOPSY 2010  . Coronary artery disease    Chronic total occlusion of mid LAD  . Diabetes mellitus without complication (HCC)   . GERD (gastroesophageal reflux disease)   . History of chicken pox   . History of shingles   . Hyperlipidemia   . Hypertension   . Hypothyroidism   . OSA on CPAP   . Squamous cell carcinoma in situ    nose 2021   . 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     Surgical History: Past Surgical History:  Procedure Laterality Date  . 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 CARDIAC CATHETERIZATION N/A 11/10/2015   Procedure: Left Heart Cath and Coronary Angiography;  Surgeon: Iran OuchMuhammad A Arida, MD;  Location: [facility] INVASIVE CV LAB;  Service: Cardiovascular;  Laterality: N/A;  . COLONOSCOPY  05/2006  . COLONOSCOPY WITH PROPOFOL N/A 05/24/2017   Procedure: COLONOSCOPY WITH PROPOFOL;  Surgeon: Christena DeemSkulskie, Martin U, MD;  [facility];  Service:  Endoscopy;  Laterality: N/A;  . EXCISION OF TONGUE LESION N/A 11/11/2015   Procedure: EXCISION OF TONGUE LESION/ WITH FROZEN SECTION;  Surgeon: Linus Salmonshapman McQueen, MD;  Location: [facility] ORS;  Service: ENT;  Laterality: N/A;  . REPLACEMENT TOTAL KNEE Left     Home Medications:  Allergies as of 04/23/2020      Reactions   Penicillins Hives, Rash, Other (See Comments), Swelling   Has patient had a PCN reaction causing immediate rash, facial/tongue/throat swelling, SOB or lightheadedness with hypotension: Yes Has patient had a PCN reaction causing severe rash involving mucus membranes or skin necrosis: No Has patient had a PCN reaction that required hospitalization No Has patient had a PCN reaction occurring within the last 10 years: No If all of the above answers are "NO", then may proceed with Cephalosporin use.      Medication List       Accurate as of April 23, 2020  1:25 PM. If you have any questions, ask your nurse or doctor.        STOP taking these medications   clotrimazole-betamethasone cream Commonly known as: LOTRISONE Stopped by: Riki AltesScott C Sharnita Bogucki, MD     TAKE these medications   aspirin 81 MG tablet Take 81 mg by mouth daily.   atorvastatin 20 MG tablet Commonly known as: LIPITOR Take 1 tablet (20 mg total) by mouth  daily at 6 PM. At night   bisoprolol-hydrochlorothiazide 2.5-6.25 MG tablet Commonly known as: ZIAC Take 1 tablet by mouth daily.   CENTRUM SILVER 50+MEN PO Take 1 tablet by mouth every morning.   clopidogrel 75 MG tablet Commonly known as: PLAVIX TAKE ONE TABLET BY MOUTH DAILY   glimepiride 1 MG tablet Commonly known as: AMARYL TAKE ONE TABLET BY MOUTH EVERY MORNING AND TAKE ONE TABLET BY MOUTH WITH SUPPER   glucose blood test strip E 11.9 qd Once Daily one touch   isosorbide mononitrate 30 MG 24 hr tablet Commonly known as: IMDUR TAKE ONE TABLET BY MOUTH EVERY MORNING AND TWO TABLETS EVERY EVENING   Jardiance 25 MG Tabs tablet Generic  drug: empagliflozin Take 25 mg by mouth daily.   Lancet Device Misc 1 Device by Does not apply route as directed.   levothyroxine 200 MCG tablet Commonly known as: SYNTHROID Take 1 tablet (200 mcg total) by mouth daily. In am on empty stomach   liothyronine 5 MCG tablet Commonly known as: CYTOMEL Take 0.5 tablets (2.5 mcg total) by mouth every other day.   metFORMIN 1000 MG tablet Commonly known as: GLUCOPHAGE Take 1 tablet (1,000 mg total) by mouth daily with breakfast.   onetouch ultrasoft lancets Qd E 11.9 Once daily   pantoprazole 40 MG tablet Commonly known as: PROTONIX Take 1 tablet (40 mg total) by mouth daily.   ramipril 10 MG capsule Commonly known as: Altace Take 2 capsules (20 mg total) by mouth daily.   TYLENOL PO Take by mouth.       Allergies:  Allergies  Allergen Reactions  . Penicillins Hives, Rash, Other (See Comments) and Swelling    Has patient had a PCN reaction causing immediate rash, facial/tongue/throat swelling, SOB or lightheadedness with hypotension: Yes Has patient had a PCN reaction causing severe rash involving mucus membranes or skin necrosis: No Has patient had a PCN reaction that required hospitalization No Has patient had a PCN reaction occurring within the last 10 years: No If all of the above answers are "NO", then may proceed with Cephalosporin use.     Family History: Family History  Problem Relation Age of Onset  . Myelodysplastic syndrome Mother   . Arthritis Mother   . Emphysema Father   . Alcohol abuse Father   . COPD Father   . Depression Father   . Early death Maternal Grandfather   . Heart disease Maternal Grandfather   . Stroke Maternal Grandfather     Social History:  reports that he has never smoked. He has never used smokeless tobacco. He reports that he does not drink alcohol and does not use drugs.   Physical Exam: BP 137/72   Pulse 71   Ht 5\' 10"  (1.778 m)   Wt 245 lb (111.1 kg)   BMI 35.15 kg/m    Constitutional:  Alert and oriented, No acute distress.   Laboratory Data:  Urinalysis Dipstick 3+ glucose Microscopy negative  Assessment & Plan:    1. Benign prostatic hyperplasia with urinary obstruction  Mild LUTS which are not bothersome  Bladder scan PVR 54 mL  2.  History gross hematuria  Urinalysis today clear  3.  Prostate cancer screening  Last PSA was performed by his PCP November 2020 and was 0.9  We discussed current AUA screening guidelines for prostate cancer between the ages of 29-69  He was reassured that the chances of developing clinically significant prostate cancer at age 49 is low  Currently  stable on no BPH medications.  I offered him continued annual follow-up versus prn and he has elected the latter.   Abbie Sons, [place] [address] [place], [place] [address] [phone]

## 2020-04-24 NOTE — Telephone Encounter (Signed)
We would need a written request faxed from their office to ours on their letter head or fax cover sheet.

## 2020-04-26 HISTORY — PX: REPLACEMENT TOTAL KNEE: SUR1224

## 2020-04-29 NOTE — Telephone Encounter (Signed)
Faxed office notes from 11/14/19 to Dr. Roderic Palau office at [phone] on 04/29/20

## 2020-04-29 NOTE — Telephone Encounter (Signed)
Office calling back in requesting fax. They spoke with Tony Lawrence upfront.  They still had not sent written request.  Was informed by Dr French Ana McLean-Scocuzza that Patient is having surgery this month and that it is okay to send this information.  Last not printed and sent to fax.

## 2020-04-30 ENCOUNTER — Other Ambulatory Visit: Payer: Self-pay

## 2020-04-30 ENCOUNTER — Ambulatory Visit (INDEPENDENT_AMBULATORY_CARE_PROVIDER_SITE_OTHER): Payer: Medicare Other

## 2020-04-30 DIAGNOSIS — G4733 Obstructive sleep apnea (adult) (pediatric): Secondary | ICD-10-CM

## 2020-04-30 NOTE — Progress Notes (Unsigned)
95 percentile pressure 10   95th percentile leak 18.8   apnea index 0.3 /hr  apnea-hypopnea index  1.5 /hr   total days used  >4 hr 90 days  total days used <4 hr 0 days  Total compliance 100 percent  He is doing great on cpap no problems or questions at this time

## 2020-05-02 ENCOUNTER — Telehealth: Payer: Self-pay | Admitting: Internal Medicine

## 2020-05-02 NOTE — Telephone Encounter (Signed)
Noted  For your information   

## 2020-05-02 NOTE — Telephone Encounter (Signed)
[facility] called sending fax about patient having surgery on right knee in [place]

## 2020-05-05 DIAGNOSIS — E65 Localized adiposity: Secondary | ICD-10-CM | POA: Diagnosis not present

## 2020-05-05 DIAGNOSIS — M1711 Unilateral primary osteoarthritis, right knee: Secondary | ICD-10-CM | POA: Diagnosis not present

## 2020-05-05 DIAGNOSIS — Z01818 Encounter for other preprocedural examination: Secondary | ICD-10-CM | POA: Diagnosis not present

## 2020-05-05 DIAGNOSIS — Z96652 Presence of left artificial knee joint: Secondary | ICD-10-CM | POA: Diagnosis not present

## 2020-05-05 DIAGNOSIS — R351 Nocturia: Secondary | ICD-10-CM | POA: Diagnosis not present

## 2020-05-05 DIAGNOSIS — E1169 Type 2 diabetes mellitus with other specified complication: Secondary | ICD-10-CM | POA: Diagnosis not present

## 2020-05-05 DIAGNOSIS — G4733 Obstructive sleep apnea (adult) (pediatric): Secondary | ICD-10-CM | POA: Diagnosis not present

## 2020-05-05 DIAGNOSIS — I25118 Atherosclerotic heart disease of native coronary artery with other forms of angina pectoris: Secondary | ICD-10-CM | POA: Diagnosis not present

## 2020-05-05 DIAGNOSIS — N401 Enlarged prostate with lower urinary tract symptoms: Secondary | ICD-10-CM | POA: Diagnosis not present

## 2020-05-05 DIAGNOSIS — I10 Essential (primary) hypertension: Secondary | ICD-10-CM | POA: Diagnosis not present

## 2020-05-05 NOTE — Telephone Encounter (Signed)
[facility] called to see if fax had been received and form  filled out  Pt is having his surgery tomorrow and they need to know if Dr. Olivia Mackie will sign his orders  Please contact [phone]

## 2020-05-06 DIAGNOSIS — Z96651 Presence of right artificial knee joint: Secondary | ICD-10-CM | POA: Diagnosis not present

## 2020-05-06 DIAGNOSIS — E119 Type 2 diabetes mellitus without complications: Secondary | ICD-10-CM | POA: Diagnosis not present

## 2020-05-06 DIAGNOSIS — Z7984 Long term (current) use of oral hypoglycemic drugs: Secondary | ICD-10-CM | POA: Diagnosis not present

## 2020-05-06 DIAGNOSIS — Z86711 Personal history of pulmonary embolism: Secondary | ICD-10-CM | POA: Diagnosis not present

## 2020-05-06 DIAGNOSIS — Z7902 Long term (current) use of antithrombotics/antiplatelets: Secondary | ICD-10-CM | POA: Diagnosis not present

## 2020-05-06 DIAGNOSIS — M1711 Unilateral primary osteoarthritis, right knee: Secondary | ICD-10-CM | POA: Diagnosis not present

## 2020-05-06 DIAGNOSIS — I10 Essential (primary) hypertension: Secondary | ICD-10-CM | POA: Diagnosis not present

## 2020-05-06 DIAGNOSIS — Z880 Allergy status to penicillin: Secondary | ICD-10-CM | POA: Diagnosis not present

## 2020-05-06 DIAGNOSIS — I2510 Atherosclerotic heart disease of native coronary artery without angina pectoris: Secondary | ICD-10-CM | POA: Diagnosis not present

## 2020-05-06 DIAGNOSIS — G4733 Obstructive sleep apnea (adult) (pediatric): Secondary | ICD-10-CM | POA: Diagnosis not present

## 2020-05-06 DIAGNOSIS — Z471 Aftercare following joint replacement surgery: Secondary | ICD-10-CM | POA: Diagnosis not present

## 2020-05-06 DIAGNOSIS — Z6834 Body mass index (BMI) 34.0-34.9, adult: Secondary | ICD-10-CM | POA: Diagnosis not present

## 2020-05-06 DIAGNOSIS — Z86718 Personal history of other venous thrombosis and embolism: Secondary | ICD-10-CM | POA: Diagnosis not present

## 2020-05-06 DIAGNOSIS — K219 Gastro-esophageal reflux disease without esophagitis: Secondary | ICD-10-CM | POA: Diagnosis not present

## 2020-05-06 DIAGNOSIS — Z79899 Other long term (current) drug therapy: Secondary | ICD-10-CM | POA: Diagnosis not present

## 2020-05-06 DIAGNOSIS — E669 Obesity, unspecified: Secondary | ICD-10-CM | POA: Diagnosis not present

## 2020-05-06 DIAGNOSIS — E039 Hypothyroidism, unspecified: Secondary | ICD-10-CM | POA: Diagnosis not present

## 2020-05-06 NOTE — Telephone Encounter (Signed)
Erasmo Downer a nurse with [facility] home [facility] is calling about patient receiving home health orders for physical therapy after his surgery today.   Patient is having surgery today in [place] and then will be coming home to [place]. Patient's surgeon unable to manage home health orders in [facility] form [place].   Asking if Dr Olivia Mackie McLean-Scocuzza will agree to sign physical therapy order for 2 weeks post surgery for patient?   Erasmo Downer would like a call back with yes or no at [phone].   They will also re-send the fax, confirmed office fax number with here. Informed that we did not receive this on 05/02/20 or 05/05/20.

## 2020-05-06 NOTE — Telephone Encounter (Signed)
Faxed Dr. Kelly Services' note back to Dr Garrel Ridgel office stating that she will sign orders but orders must come from ortho. Faxed to [phone]

## 2020-05-06 NOTE — Telephone Encounter (Signed)
Received call from Dr Rico Ala office. They state they received a fax back with the [facility] orders stating that they need to come from Ortho.   Informed her of out fax number as Dr Olivia Mackie McLean-Scocuzza will sign these. Dr Olivia Mackie McLean-Scocuzza states she will sign orders for physical therapy but all instructions will need to come from orthopedics.   They will fax these orders back to [place] to be signed and send to [facility].

## 2020-05-07 DIAGNOSIS — Z792 Long term (current) use of antibiotics: Secondary | ICD-10-CM | POA: Diagnosis not present

## 2020-05-07 DIAGNOSIS — G4733 Obstructive sleep apnea (adult) (pediatric): Secondary | ICD-10-CM | POA: Diagnosis not present

## 2020-05-07 DIAGNOSIS — I2510 Atherosclerotic heart disease of native coronary artery without angina pectoris: Secondary | ICD-10-CM | POA: Diagnosis not present

## 2020-05-07 DIAGNOSIS — Z96651 Presence of right artificial knee joint: Secondary | ICD-10-CM | POA: Diagnosis not present

## 2020-05-07 DIAGNOSIS — Z79891 Long term (current) use of opiate analgesic: Secondary | ICD-10-CM | POA: Diagnosis not present

## 2020-05-07 DIAGNOSIS — Z96652 Presence of left artificial knee joint: Secondary | ICD-10-CM | POA: Diagnosis not present

## 2020-05-07 DIAGNOSIS — I10 Essential (primary) hypertension: Secondary | ICD-10-CM | POA: Diagnosis not present

## 2020-05-07 DIAGNOSIS — Z85828 Personal history of other malignant neoplasm of skin: Secondary | ICD-10-CM | POA: Diagnosis not present

## 2020-05-07 DIAGNOSIS — E119 Type 2 diabetes mellitus without complications: Secondary | ICD-10-CM | POA: Diagnosis not present

## 2020-05-07 DIAGNOSIS — Z7984 Long term (current) use of oral hypoglycemic drugs: Secondary | ICD-10-CM | POA: Diagnosis not present

## 2020-05-07 DIAGNOSIS — E039 Hypothyroidism, unspecified: Secondary | ICD-10-CM | POA: Diagnosis not present

## 2020-05-07 DIAGNOSIS — Z471 Aftercare following joint replacement surgery: Secondary | ICD-10-CM | POA: Diagnosis not present

## 2020-05-07 DIAGNOSIS — Z7902 Long term (current) use of antithrombotics/antiplatelets: Secondary | ICD-10-CM | POA: Diagnosis not present

## 2020-05-07 DIAGNOSIS — Z7982 Long term (current) use of aspirin: Secondary | ICD-10-CM | POA: Diagnosis not present

## 2020-05-07 DIAGNOSIS — Z86718 Personal history of other venous thrombosis and embolism: Secondary | ICD-10-CM | POA: Diagnosis not present

## 2020-05-07 NOTE — Telephone Encounter (Signed)
All paperwork and orders signed

## 2020-05-09 DIAGNOSIS — E039 Hypothyroidism, unspecified: Secondary | ICD-10-CM | POA: Diagnosis not present

## 2020-05-09 DIAGNOSIS — I10 Essential (primary) hypertension: Secondary | ICD-10-CM | POA: Diagnosis not present

## 2020-05-09 DIAGNOSIS — Z7982 Long term (current) use of aspirin: Secondary | ICD-10-CM | POA: Diagnosis not present

## 2020-05-09 DIAGNOSIS — G4733 Obstructive sleep apnea (adult) (pediatric): Secondary | ICD-10-CM | POA: Diagnosis not present

## 2020-05-09 DIAGNOSIS — R351 Nocturia: Secondary | ICD-10-CM | POA: Diagnosis not present

## 2020-05-09 DIAGNOSIS — I25118 Atherosclerotic heart disease of native coronary artery with other forms of angina pectoris: Secondary | ICD-10-CM | POA: Diagnosis not present

## 2020-05-09 DIAGNOSIS — Z6834 Body mass index (BMI) 34.0-34.9, adult: Secondary | ICD-10-CM | POA: Diagnosis not present

## 2020-05-09 DIAGNOSIS — E65 Localized adiposity: Secondary | ICD-10-CM | POA: Diagnosis not present

## 2020-05-09 DIAGNOSIS — N401 Enlarged prostate with lower urinary tract symptoms: Secondary | ICD-10-CM | POA: Diagnosis not present

## 2020-05-09 DIAGNOSIS — Z86718 Personal history of other venous thrombosis and embolism: Secondary | ICD-10-CM | POA: Diagnosis not present

## 2020-05-09 DIAGNOSIS — Z96652 Presence of left artificial knee joint: Secondary | ICD-10-CM | POA: Diagnosis not present

## 2020-05-09 DIAGNOSIS — Z7984 Long term (current) use of oral hypoglycemic drugs: Secondary | ICD-10-CM | POA: Diagnosis not present

## 2020-05-09 DIAGNOSIS — E119 Type 2 diabetes mellitus without complications: Secondary | ICD-10-CM | POA: Diagnosis not present

## 2020-05-09 DIAGNOSIS — Z7902 Long term (current) use of antithrombotics/antiplatelets: Secondary | ICD-10-CM | POA: Diagnosis not present

## 2020-05-09 DIAGNOSIS — Z96651 Presence of right artificial knee joint: Secondary | ICD-10-CM | POA: Diagnosis not present

## 2020-05-09 DIAGNOSIS — Z471 Aftercare following joint replacement surgery: Secondary | ICD-10-CM | POA: Diagnosis not present

## 2020-05-13 DIAGNOSIS — I25118 Atherosclerotic heart disease of native coronary artery with other forms of angina pectoris: Secondary | ICD-10-CM | POA: Diagnosis not present

## 2020-05-13 DIAGNOSIS — Z96651 Presence of right artificial knee joint: Secondary | ICD-10-CM | POA: Diagnosis not present

## 2020-05-13 DIAGNOSIS — E119 Type 2 diabetes mellitus without complications: Secondary | ICD-10-CM | POA: Diagnosis not present

## 2020-05-13 DIAGNOSIS — N401 Enlarged prostate with lower urinary tract symptoms: Secondary | ICD-10-CM | POA: Diagnosis not present

## 2020-05-13 DIAGNOSIS — I10 Essential (primary) hypertension: Secondary | ICD-10-CM | POA: Diagnosis not present

## 2020-05-13 DIAGNOSIS — Z471 Aftercare following joint replacement surgery: Secondary | ICD-10-CM | POA: Diagnosis not present

## 2020-05-14 ENCOUNTER — Telehealth: Payer: Self-pay | Admitting: Internal Medicine

## 2020-05-14 DIAGNOSIS — I25118 Atherosclerotic heart disease of native coronary artery with other forms of angina pectoris: Secondary | ICD-10-CM | POA: Diagnosis not present

## 2020-05-14 DIAGNOSIS — E119 Type 2 diabetes mellitus without complications: Secondary | ICD-10-CM | POA: Diagnosis not present

## 2020-05-14 DIAGNOSIS — Z471 Aftercare following joint replacement surgery: Secondary | ICD-10-CM | POA: Diagnosis not present

## 2020-05-14 DIAGNOSIS — N401 Enlarged prostate with lower urinary tract symptoms: Secondary | ICD-10-CM | POA: Diagnosis not present

## 2020-05-14 DIAGNOSIS — Z96651 Presence of right artificial knee joint: Secondary | ICD-10-CM | POA: Diagnosis not present

## 2020-05-14 DIAGNOSIS — I10 Essential (primary) hypertension: Secondary | ICD-10-CM | POA: Diagnosis not present

## 2020-05-15 DIAGNOSIS — Z96651 Presence of right artificial knee joint: Secondary | ICD-10-CM | POA: Diagnosis not present

## 2020-05-15 DIAGNOSIS — Z471 Aftercare following joint replacement surgery: Secondary | ICD-10-CM | POA: Diagnosis not present

## 2020-05-15 DIAGNOSIS — I10 Essential (primary) hypertension: Secondary | ICD-10-CM | POA: Diagnosis not present

## 2020-05-15 DIAGNOSIS — N401 Enlarged prostate with lower urinary tract symptoms: Secondary | ICD-10-CM | POA: Diagnosis not present

## 2020-05-15 DIAGNOSIS — I25118 Atherosclerotic heart disease of native coronary artery with other forms of angina pectoris: Secondary | ICD-10-CM | POA: Diagnosis not present

## 2020-05-15 DIAGNOSIS — E119 Type 2 diabetes mellitus without complications: Secondary | ICD-10-CM | POA: Diagnosis not present

## 2020-05-19 DIAGNOSIS — I10 Essential (primary) hypertension: Secondary | ICD-10-CM | POA: Diagnosis not present

## 2020-05-19 DIAGNOSIS — I25118 Atherosclerotic heart disease of native coronary artery with other forms of angina pectoris: Secondary | ICD-10-CM | POA: Diagnosis not present

## 2020-05-19 DIAGNOSIS — N401 Enlarged prostate with lower urinary tract symptoms: Secondary | ICD-10-CM | POA: Diagnosis not present

## 2020-05-19 DIAGNOSIS — Z96651 Presence of right artificial knee joint: Secondary | ICD-10-CM | POA: Diagnosis not present

## 2020-05-19 DIAGNOSIS — Z471 Aftercare following joint replacement surgery: Secondary | ICD-10-CM | POA: Diagnosis not present

## 2020-05-19 DIAGNOSIS — E119 Type 2 diabetes mellitus without complications: Secondary | ICD-10-CM | POA: Diagnosis not present

## 2020-05-20 ENCOUNTER — Other Ambulatory Visit: Payer: Self-pay

## 2020-05-20 ENCOUNTER — Encounter: Payer: Self-pay | Admitting: Internal Medicine

## 2020-05-20 ENCOUNTER — Telehealth (INDEPENDENT_AMBULATORY_CARE_PROVIDER_SITE_OTHER): Payer: Medicare Other | Admitting: Internal Medicine

## 2020-05-20 VITALS — BP 118/68 | Ht 70.0 in | Wt 240.0 lb

## 2020-05-20 DIAGNOSIS — I152 Hypertension secondary to endocrine disorders: Secondary | ICD-10-CM | POA: Diagnosis not present

## 2020-05-20 DIAGNOSIS — R058 Other specified cough: Secondary | ICD-10-CM | POA: Diagnosis not present

## 2020-05-20 DIAGNOSIS — E039 Hypothyroidism, unspecified: Secondary | ICD-10-CM | POA: Diagnosis not present

## 2020-05-20 DIAGNOSIS — E1159 Type 2 diabetes mellitus with other circulatory complications: Secondary | ICD-10-CM

## 2020-05-20 DIAGNOSIS — M7989 Other specified soft tissue disorders: Secondary | ICD-10-CM

## 2020-05-20 DIAGNOSIS — E119 Type 2 diabetes mellitus without complications: Secondary | ICD-10-CM

## 2020-05-20 MED ORDER — EMPAGLIFLOZIN 25 MG PO TABS: 25.0000 mg | ORAL_TABLET | Freq: Every day | ORAL | 3 refills | Status: DC

## 2020-05-20 MED ORDER — LEVOTHYROXINE SODIUM 200 MCG PO TABS: 200.0000 ug | ORAL_TABLET | Freq: Every day | ORAL | 3 refills | Status: DC

## 2020-05-20 MED ORDER — METFORMIN HCL 1000 MG PO TABS: 1000.0000 mg | ORAL_TABLET | Freq: Every day | ORAL | 3 refills | Status: DC

## 2020-05-20 MED ORDER — LOSARTAN POTASSIUM 25 MG PO TABS
25.0000 mg | ORAL_TABLET | Freq: Every day | ORAL | 3 refills | Status: DC
Start: 2020-05-20 — End: 2021-02-27

## 2020-05-20 NOTE — Progress Notes (Signed)
Telephone Note  I connected with Tony Lawrence  on 05/20/20 at  1:30 PM EST by telephone and verified that I am speaking with the correct person using two identifiers.  Location patient: home, [name] Location provider:work or home office Persons participating in the virtual visit: patient, provider  I discussed the limitations of evaluation and management by telemedicine and the availability of in person appointments. The patient expressed understanding and agreed to proceed.   HPI: 1. 05/06/20 right min. Invasive TKA Dr. Nash Mantis in [place] doing well s/p knee replacement in early 2021 left knee still recovering right knee replacement doing well pain 2/10 doing PT this week 3 more times then 2 x the following week then 2x after this until 06/09/20 will need outpatient PT advised pt right thigh outside between knee and hip bruise and inner thigh closer to knee bruise h/o DVT and did not know after left knee surgery in 2021 He does have right leg swelling  Currently not on blood thinners  2. Dry cough zyrtec helps on ramipril 20 mg qd will change to losartan 25 mg qd    ROS: See pertinent positives and negatives per HPI.  Past Medical History:  Diagnosis Date  . Actinic keratoses   . Allergy   . Arthritis    low back pain s/p shots and blocks, knee pain  . BCC (basal cell carcinoma of skin)    forehead and chest Dr. Evorn Gong q 6 months   . CAD (coronary artery disease)   . Complication of anesthesia    SEVERE SORE THROAT AFTER BIOPSY 2010  . Coronary artery disease    Chronic total occlusion of mid LAD  . Diabetes mellitus without complication ([place])   . GERD (gastroesophageal reflux disease)   . History of chicken pox   . History of shingles   . Hyperlipidemia   . Hypertension   . Hypothyroidism   . OSA on CPAP   . Squamous cell carcinoma in situ    nose 2021   . 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 Tami Ribas, [facility]     Past  Surgical History:  Procedure Laterality Date  . 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 CARDIAC CATHETERIZATION N/A 11/10/2015   Procedure: Left Heart Cath and Coronary Angiography;  Surgeon: Wellington Hampshire, MD;  Location: [place] CV LAB;  Service: Cardiovascular;  Laterality: N/A;  . COLONOSCOPY  05/2006  . COLONOSCOPY WITH PROPOFOL N/A 05/24/2017   Procedure: COLONOSCOPY WITH PROPOFOL;  Surgeon: Lollie Sails, MD;  Location[facility] ENDOSCOPY;  Service: Endoscopy;  Laterality: N/A;  . EXCISION OF TONGUE LESION N/A 11/11/2015   Procedure: EXCISION OF TONGUE LESION/ WITH FROZEN SECTION;  Surgeon: Beverly Gust, MD;  Location: [facility] ORS;  Service: ENT;  Laterality: N/A;  . REPLACEMENT TOTAL KNEE Left   . right tka minimally invasive 05/06/20 Dr. Armandina Gemma       Current Outpatient Medications:  .  Acetaminophen (TYLENOL PO), Take by mouth., Disp: , Rfl:  .  aspirin 81 MG tablet, Take 81 mg by mouth daily., Disp: , Rfl:  .  atorvastatin (LIPITOR) 20 MG tablet, Take 1 tablet (20 mg total) by mouth daily at 6 PM. At night, Disp: 90 tablet, Rfl: 3 .  bisoprolol-hydrochlorothiazide (ZIAC) 2.5-6.25 MG tablet, Take 1 tablet by mouth daily., Disp: 90 tablet, Rfl: 3 .  clopidogrel (PLAVIX) 75 MG tablet, TAKE ONE  TABLET BY MOUTH DAILY, Disp: 90 tablet, Rfl: 0 .  glimepiride (AMARYL) 1 MG tablet, TAKE ONE TABLET BY MOUTH EVERY MORNING AND TAKE ONE TABLET BY MOUTH WITH SUPPER, Disp: 180 tablet, Rfl: 3 .  glucose blood test strip, E 11.9 qd Once Daily one touch, Disp: 100 each, Rfl: 12 .  HYDROcodone-acetaminophen (NORCO) 10-325 MG tablet, Take 1 tablet by mouth every 4 (four) hours as needed., Disp: , Rfl:  .  isosorbide mononitrate (IMDUR) 30 MG 24 hr tablet, TAKE ONE TABLET BY MOUTH EVERY MORNING AND TWO TABLETS EVERY EVENING, Disp: 270 tablet, Rfl: 3 .  Lancets (ONETOUCH ULTRASOFT) lancets, Qd E 11.9 Once daily, Disp: 100 each, Rfl: 12 .  liothyronine (CYTOMEL) 5 MCG tablet, Take 0.5  tablets (2.5 mcg total) by mouth every other day., Disp: 45 tablet, Rfl: 3 .  losartan (COZAAR) 25 MG tablet, Take 1 tablet (25 mg total) by mouth daily. Stop ramipril 20 mg qd, Disp: 90 tablet, Rfl: 3 .  Multiple Vitamins-Minerals (CENTRUM SILVER 50+MEN PO), Take 1 tablet by mouth every morning. , Disp: , Rfl:  .  pantoprazole (PROTONIX) 40 MG tablet, Take 1 tablet (40 mg total) by mouth daily., Disp: 90 tablet, Rfl: 3 .  pregabalin (LYRICA) 50 MG capsule, Take 50 mg by mouth daily at 12 noon., Disp: , Rfl:  .  empagliflozin (JARDIANCE) 25 MG TABS tablet, Take 1 tablet (25 mg total) by mouth daily., Disp: 90 tablet, Rfl: 3 .  levothyroxine (SYNTHROID) 200 MCG tablet, Take 1 tablet (200 mcg total) by mouth daily. In am on empty stomach, Disp: 90 tablet, Rfl: 3 .  metFORMIN (GLUCOPHAGE) 1000 MG tablet, Take 1 tablet (1,000 mg total) by mouth daily with breakfast., Disp: 90 tablet, Rfl: 3  EXAM:  VITALS per patient if applicable:  GENERAL: alert, oriented, appears well and in no acute distress  PSYCH/NEURO: pleasant and cooperative, no obvious depression or anxiety, speech and thought processing grossly intact  ASSESSMENT AND PLAN:  Discussed the following assessment and plan:  Right leg swelling r/o DVT s/p left total knee in 2021 - Plan: US Venous Img Lower Unilateral Right S/p right total knee min invasive 05/06/20 Dr. Nash Mantis in [place]   Hypertension associated with diabetes ([place]) - Plan: losartan (COZAAR) 25 MG tablet stop ramipril 20 mg qd Monitor BP  Hypothyroidism, unspecified type - Plan: levothyroxine (SYNTHROID) 200 MCG tablet Cytomel 2.5 mg qod   Type 2 diabetes mellitus without complication, without long-term current use of insulin (HCC) - Plan: empagliflozin (JARDIANCE) 25 MG TABS tablet, metFORMIN (GLUCOPHAGE) 1000 MG tablet Dry cough Change ramipril 20 mg qd to losartan 25 mg qd   HM Flu shotutd prevnar had 02/03/18  Tdaphad 08/02/2018 zostervax per pt had in the  past Had 2/2 shigrix pna 23 utd covid 19 vx 3/3  Colonoscopy 05/24/2017 diverticulosis no etiology for diarrhea  -repeat in 10 years Dr. Gustavo Lah   PSA 0.90 h/o mild BPH on imaging f/u urology Dr. Bernardo Heater 04/23/20 f/u annually vs prn no further PSA testing rec.   Dr. Sharlet Salina as of 11/14/19 back issues ok  Derm Dr. 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 Bernestine Am[facility]-will f/u  prnQ3 months appt 4 or 08/2019 Dr. Tami Ribas ENT ROI sent 11/14/19   [place] eye Dr. Merla Riches  Dentist Dr. Eugenie Birks  Cardiology Dr. Saunders Revel  GI Dr. Gustavo Lah Dr. Nash Mantis ortho right min. Invasive TKA 05/06/20 in [place]   -we discussed possible serious and likely etiologies, options for evaluation and workup, limitations of telemedicine visit vs in person visit, treatment, treatment risks and precautions.   I discussed the assessment and treatment plan with the patient. The patient was provided an opportunity to ask questions and all were answered. The patient agreed with the plan and demonstrated an understanding of the instructions.    Time spent 20 min Delorise Jackson, MD

## 2020-05-20 NOTE — Patient Instructions (Addendum)
Cough, Adult Coughing is a reflex that clears your throat and your airways (respiratory system). Coughing helps to heal and protect your lungs. It is normal to cough occasionally, but a cough that happens with other symptoms or lasts a long time may be a sign of a condition that needs treatment. An acute cough may only last 2-3 weeks, while a chronic cough may last 8 or more weeks. Coughing is commonly caused by:  Infection of the respiratory systemby viruses or bacteria.  Breathing in substances that irritate your lungs.  Allergies.  Asthma.  Mucus that runs down the back of your throat (postnasal drip).  Smoking.  Acid backing up from the stomach into the esophagus (gastroesophageal reflux).  Certain medicines.  Chronic lung problems.  Other medical conditions such as heart failure or a blood clot in the lung (pulmonary embolism). Follow these instructions at home: Medicines  Take over-the-counter and prescription medicines only as told by your health care provider.  Talk with your health care provider before you take a cough suppressant medicine. Lifestyle  Avoid cigarette smoke. Do not use any products that contain nicotine or tobacco, such as cigarettes, e-cigarettes, and chewing tobacco. If you need help quitting, ask your health care provider.  Drink enough fluid to keep your urine pale yellow.  Avoid caffeine.  Do not drink alcohol if your health care provider tells you not to drink.   General instructions  Pay close attention to changes in your cough. Tell your health care provider about them.  Always cover your mouth when you cough.  Avoid things that make you cough, such as perfume, candles, cleaning products, or campfire or tobacco smoke.  If the air is dry, use a cool mist vaporizer or humidifier in your bedroom or your home to help loosen secretions.  If your cough is worse at night, try to sleep in a semi-upright position.  Rest as needed.  Keep all  follow-up visits as told by your health care provider. This is important.   Contact a health care provider if you:  Have new symptoms.  Cough up pus.  Have a cough that does not get better after 2-3 weeks or gets worse.  Cannot control your cough with cough suppressant medicines and you are losing sleep.  Have pain that gets worse or pain that is not helped with medicine.  Have a fever.  Have unexplained weight loss.  Have night sweats. Get help right away if:  You cough up blood.  You have difficulty breathing.  Your heartbeat is very fast. These symptoms may represent a serious problem that is an emergency. Do not wait to see if the symptoms will go away. Get medical help right away. Call your local emergency services (911 in the [place] Do not drive yourself to the hospital. Summary  Coughing is a reflex that clears your throat and your airways. It is normal to cough occasionally, but a cough that happens with other symptoms or lasts a long time may be a sign of a condition that needs treatment.  Take over-the-counter and prescription medicines only as told by your health care provider.  Always cover your mouth when you cough.  Contact a health care provider if you have new symptoms or a cough that does not get better after 2-3 weeks or gets worse. This information is not intended to replace advice given to you by your health care provider. Make sure you discuss any questions you have with your health care provider. Document   Revised: 05/01/2018 Document Reviewed: 05/01/2018 Elsevier Patient Education  2021 [employer].  Losartan Tablets What is this medicine? LOSARTAN (loe SAR tan) is an angiotensin II receptor blocker, also known as an ARB. It treats high blood pressure. It can slow kidney damage in some patients. It may also be used to lower the risk of stroke. This medicine may be used for other purposes; ask your health care provider or pharmacist if you have  questions. COMMON BRAND NAME(S): Cozaar What should I tell my health care provider before I take this medicine? They need to know if you have any of these conditions:  heart failure  kidney disease  liver disease  an unusual or allergic reaction to losartan, other medicines, foods, dyes, or preservatives  pregnant or trying to get pregnant  breast-feeding How should I use this medicine? Take this medicine by mouth. Take it as directed on the prescription label at the same time every day. You can take it with or without food. If it upsets your stomach, take it with food. Keep taking it unless your health care provider tells you to stop. Talk to your health care provider about the use of this medicine in children. While it may be prescribed for children as young as 6 for selected conditions, precautions do apply. Overdosage: If you think you have taken too much of this medicine contact a poison control center or emergency room at once. NOTE: This medicine is only for you. Do not share this medicine with others. What if I miss a dose? If you miss a dose, take it as soon as you can. If it is almost time for your next dose, take only that dose. Do not take double or extra doses. What may interact with this medicine?  aliskiren  ACE inhibitors, like enalapril or lisinopril  diuretics, especially amiloride, eplerenone, spironolactone, or triamterene  lithium  NSAIDs, medicines for pain and inflammation, like ibuprofen or naproxen  potassium salts or potassium supplements This list may not describe all possible interactions. Give your health care provider a list of all the medicines, herbs, non-prescription drugs, or dietary supplements you use. Also tell them if you smoke, drink alcohol, or use illegal drugs. Some items may interact with your medicine. What should I watch for while using this medicine? Visit your health care provider for regular check ups. Check your blood pressure as  directed. Ask your health care provider what your blood pressure should be. Also, find out when you should contact him or her. Do not treat yourself for coughs, colds, or pain while you are using this medicine without asking your health care provider for advice. Some medicines may increase your blood pressure. Women should inform their health care provider if they wish to become pregnant or think they might be pregnant. There is a potential for serious side effects to an unborn child. Talk to your health care provider for more information. You may get drowsy or dizzy. Do not drive, use machinery, or do anything that needs mental alertness until you know how this medicine affects you. Do not stand or sit up quickly, especially if you are an older patient. This reduces the risk of dizzy or fainting spells. Alcohol can make you more drowsy and dizzy. Avoid alcoholic drinks. Avoid salt substitutes unless you are told otherwise by your health care provider. What side effects may I notice from receiving this medicine? Side effects that you should report to your doctor or health care professional as soon as  possible:  allergic reactions (skin rash, itching or hives, swelling of the hands, feet, face, lips, throat, or tongue)  breathing problems  high potassium levels (chest pain; or fast, irregular heartbeat; muscle weakness)  kidney injury (trouble passing urine or change in the amount of urine)  low blood pressure (dizziness; feeling faint or lightheaded, falls; unusually weak or tired) Side effects that usually do not require medical attention (report to your doctor or health care professional if they continue or are bothersome):  cough  headache  nasal congestion or stuffiness  nausea or stomach pain This list may not describe all possible side effects. Call your doctor for medical advice about side effects. You may report side effects to FDA at 1-800-FDA-1088. Where should I keep my  medicine? Keep out of the reach of children and pets. Store at room temperature between 20 and 25 degrees C (68 and 77 degrees F). Protect from light. Keep the container tightly closed. Get rid of any unused medicine after the expiration date. To get rid of medicines that are no longer needed or have expired:  Take the medicine to a medicine take-back program. Check with your pharmacy or law enforcement to find a location.  If you cannot return the medicine, check the label or package insert to see if the medicine should be thrown out in the garbage or flushed down the toilet. If you are not sure, ask your health care provider. If it is safe to put in the trash, empty the medicine out of the container. Mix the medicine with cat litter, dirt, coffee grounds, or other unwanted substance. Seal the mixture in a bag or container. Put it in the trash. NOTE: This sheet is a summary. It may not cover all possible information. If you have questions about this medicine, talk to your doctor, pharmacist, or health care provider.  2021 Elsevier/Gold Standard (2019-06-21 16:16:09)

## 2020-05-21 DIAGNOSIS — I10 Essential (primary) hypertension: Secondary | ICD-10-CM | POA: Diagnosis not present

## 2020-05-21 DIAGNOSIS — E119 Type 2 diabetes mellitus without complications: Secondary | ICD-10-CM | POA: Diagnosis not present

## 2020-05-21 DIAGNOSIS — Z471 Aftercare following joint replacement surgery: Secondary | ICD-10-CM | POA: Diagnosis not present

## 2020-05-21 DIAGNOSIS — N401 Enlarged prostate with lower urinary tract symptoms: Secondary | ICD-10-CM | POA: Diagnosis not present

## 2020-05-21 DIAGNOSIS — Z96651 Presence of right artificial knee joint: Secondary | ICD-10-CM | POA: Diagnosis not present

## 2020-05-21 DIAGNOSIS — I25118 Atherosclerotic heart disease of native coronary artery with other forms of angina pectoris: Secondary | ICD-10-CM | POA: Diagnosis not present

## 2020-05-23 DIAGNOSIS — I10 Essential (primary) hypertension: Secondary | ICD-10-CM | POA: Diagnosis not present

## 2020-05-23 DIAGNOSIS — I25118 Atherosclerotic heart disease of native coronary artery with other forms of angina pectoris: Secondary | ICD-10-CM | POA: Diagnosis not present

## 2020-05-23 DIAGNOSIS — N401 Enlarged prostate with lower urinary tract symptoms: Secondary | ICD-10-CM | POA: Diagnosis not present

## 2020-05-23 DIAGNOSIS — Z471 Aftercare following joint replacement surgery: Secondary | ICD-10-CM | POA: Diagnosis not present

## 2020-05-23 DIAGNOSIS — E119 Type 2 diabetes mellitus without complications: Secondary | ICD-10-CM | POA: Diagnosis not present

## 2020-05-23 DIAGNOSIS — Z96651 Presence of right artificial knee joint: Secondary | ICD-10-CM | POA: Diagnosis not present

## 2020-05-26 DIAGNOSIS — Z471 Aftercare following joint replacement surgery: Secondary | ICD-10-CM | POA: Diagnosis not present

## 2020-05-26 DIAGNOSIS — E119 Type 2 diabetes mellitus without complications: Secondary | ICD-10-CM | POA: Diagnosis not present

## 2020-05-26 DIAGNOSIS — I25118 Atherosclerotic heart disease of native coronary artery with other forms of angina pectoris: Secondary | ICD-10-CM | POA: Diagnosis not present

## 2020-05-26 DIAGNOSIS — I10 Essential (primary) hypertension: Secondary | ICD-10-CM | POA: Diagnosis not present

## 2020-05-26 DIAGNOSIS — Z96651 Presence of right artificial knee joint: Secondary | ICD-10-CM | POA: Diagnosis not present

## 2020-05-26 DIAGNOSIS — N401 Enlarged prostate with lower urinary tract symptoms: Secondary | ICD-10-CM | POA: Diagnosis not present

## 2020-05-28 ENCOUNTER — Telehealth: Payer: Self-pay

## 2020-05-28 DIAGNOSIS — Z96651 Presence of right artificial knee joint: Secondary | ICD-10-CM | POA: Diagnosis not present

## 2020-05-28 DIAGNOSIS — I25118 Atherosclerotic heart disease of native coronary artery with other forms of angina pectoris: Secondary | ICD-10-CM | POA: Diagnosis not present

## 2020-05-28 DIAGNOSIS — I10 Essential (primary) hypertension: Secondary | ICD-10-CM | POA: Diagnosis not present

## 2020-05-28 DIAGNOSIS — E119 Type 2 diabetes mellitus without complications: Secondary | ICD-10-CM | POA: Diagnosis not present

## 2020-05-28 DIAGNOSIS — Z471 Aftercare following joint replacement surgery: Secondary | ICD-10-CM | POA: Diagnosis not present

## 2020-05-28 DIAGNOSIS — N401 Enlarged prostate with lower urinary tract symptoms: Secondary | ICD-10-CM | POA: Diagnosis not present

## 2020-05-28 NOTE — Telephone Encounter (Signed)
Faxed signed order #[id number] cert period 08/08/81-0/94/07 to [facility] [phone] Freq 1WK1, 6KG8, 2WK2

## 2020-06-04 ENCOUNTER — Other Ambulatory Visit: Payer: Self-pay | Admitting: Internal Medicine

## 2020-06-04 DIAGNOSIS — M25661 Stiffness of right knee, not elsewhere classified: Secondary | ICD-10-CM | POA: Diagnosis not present

## 2020-06-04 DIAGNOSIS — R262 Difficulty in walking, not elsewhere classified: Secondary | ICD-10-CM | POA: Diagnosis not present

## 2020-06-04 DIAGNOSIS — M25561 Pain in right knee: Secondary | ICD-10-CM | POA: Diagnosis not present

## 2020-06-05 DIAGNOSIS — M25661 Stiffness of right knee, not elsewhere classified: Secondary | ICD-10-CM | POA: Diagnosis not present

## 2020-06-05 DIAGNOSIS — M25561 Pain in right knee: Secondary | ICD-10-CM | POA: Diagnosis not present

## 2020-06-05 DIAGNOSIS — R262 Difficulty in walking, not elsewhere classified: Secondary | ICD-10-CM | POA: Diagnosis not present

## 2020-06-09 DIAGNOSIS — R262 Difficulty in walking, not elsewhere classified: Secondary | ICD-10-CM | POA: Diagnosis not present

## 2020-06-09 DIAGNOSIS — M25661 Stiffness of right knee, not elsewhere classified: Secondary | ICD-10-CM | POA: Diagnosis not present

## 2020-06-09 DIAGNOSIS — M25561 Pain in right knee: Secondary | ICD-10-CM | POA: Diagnosis not present

## 2020-06-10 ENCOUNTER — Other Ambulatory Visit: Payer: Self-pay

## 2020-06-10 ENCOUNTER — Ambulatory Visit
Admission: RE | Admit: 2020-06-10 | Discharge: 2020-06-10 | Disposition: A | Discharge status: Home / Self Care | Payer: Medicare Other | Source: Ambulatory Visit | Attending: Internal Medicine | Admitting: Internal Medicine

## 2020-06-10 DIAGNOSIS — M7989 Other specified soft tissue disorders: Secondary | ICD-10-CM | POA: Diagnosis not present

## 2020-06-11 DIAGNOSIS — R262 Difficulty in walking, not elsewhere classified: Secondary | ICD-10-CM | POA: Diagnosis not present

## 2020-06-11 DIAGNOSIS — M25661 Stiffness of right knee, not elsewhere classified: Secondary | ICD-10-CM | POA: Diagnosis not present

## 2020-06-11 DIAGNOSIS — M25561 Pain in right knee: Secondary | ICD-10-CM | POA: Diagnosis not present

## 2020-06-16 DIAGNOSIS — M25561 Pain in right knee: Secondary | ICD-10-CM | POA: Diagnosis not present

## 2020-06-16 DIAGNOSIS — M25661 Stiffness of right knee, not elsewhere classified: Secondary | ICD-10-CM | POA: Diagnosis not present

## 2020-06-16 DIAGNOSIS — R262 Difficulty in walking, not elsewhere classified: Secondary | ICD-10-CM | POA: Diagnosis not present

## 2020-06-18 DIAGNOSIS — R262 Difficulty in walking, not elsewhere classified: Secondary | ICD-10-CM | POA: Diagnosis not present

## 2020-06-18 DIAGNOSIS — M25661 Stiffness of right knee, not elsewhere classified: Secondary | ICD-10-CM | POA: Diagnosis not present

## 2020-06-18 DIAGNOSIS — M25561 Pain in right knee: Secondary | ICD-10-CM | POA: Diagnosis not present

## 2020-06-20 ENCOUNTER — Other Ambulatory Visit: Payer: Self-pay

## 2020-06-20 MED ORDER — ISOSORBIDE MONONITRATE ER 30 MG PO TB24: ORAL_TABLET | ORAL | 1 refills | Status: DC

## 2020-06-23 DIAGNOSIS — M25561 Pain in right knee: Secondary | ICD-10-CM | POA: Diagnosis not present

## 2020-06-23 DIAGNOSIS — R262 Difficulty in walking, not elsewhere classified: Secondary | ICD-10-CM | POA: Diagnosis not present

## 2020-06-23 DIAGNOSIS — M25661 Stiffness of right knee, not elsewhere classified: Secondary | ICD-10-CM | POA: Diagnosis not present

## 2020-06-25 DIAGNOSIS — R262 Difficulty in walking, not elsewhere classified: Secondary | ICD-10-CM | POA: Diagnosis not present

## 2020-06-25 DIAGNOSIS — M25561 Pain in right knee: Secondary | ICD-10-CM | POA: Diagnosis not present

## 2020-06-25 DIAGNOSIS — M25661 Stiffness of right knee, not elsewhere classified: Secondary | ICD-10-CM | POA: Diagnosis not present

## 2020-06-30 DIAGNOSIS — M25661 Stiffness of right knee, not elsewhere classified: Secondary | ICD-10-CM | POA: Diagnosis not present

## 2020-06-30 DIAGNOSIS — M25561 Pain in right knee: Secondary | ICD-10-CM | POA: Diagnosis not present

## 2020-06-30 DIAGNOSIS — R262 Difficulty in walking, not elsewhere classified: Secondary | ICD-10-CM | POA: Diagnosis not present

## 2020-07-02 DIAGNOSIS — R262 Difficulty in walking, not elsewhere classified: Secondary | ICD-10-CM | POA: Diagnosis not present

## 2020-07-02 DIAGNOSIS — M25661 Stiffness of right knee, not elsewhere classified: Secondary | ICD-10-CM | POA: Diagnosis not present

## 2020-07-02 DIAGNOSIS — M25561 Pain in right knee: Secondary | ICD-10-CM | POA: Diagnosis not present

## 2020-07-02 DIAGNOSIS — Z85810 Personal history of malignant neoplasm of tongue: Secondary | ICD-10-CM | POA: Diagnosis not present

## 2020-07-07 DIAGNOSIS — M25561 Pain in right knee: Secondary | ICD-10-CM | POA: Diagnosis not present

## 2020-07-07 DIAGNOSIS — M25661 Stiffness of right knee, not elsewhere classified: Secondary | ICD-10-CM | POA: Diagnosis not present

## 2020-07-07 DIAGNOSIS — R262 Difficulty in walking, not elsewhere classified: Secondary | ICD-10-CM | POA: Diagnosis not present

## 2020-07-09 DIAGNOSIS — M25561 Pain in right knee: Secondary | ICD-10-CM | POA: Diagnosis not present

## 2020-07-09 DIAGNOSIS — M25661 Stiffness of right knee, not elsewhere classified: Secondary | ICD-10-CM | POA: Diagnosis not present

## 2020-07-09 DIAGNOSIS — R262 Difficulty in walking, not elsewhere classified: Secondary | ICD-10-CM | POA: Diagnosis not present

## 2020-07-22 ENCOUNTER — Telehealth (INDEPENDENT_AMBULATORY_CARE_PROVIDER_SITE_OTHER): Payer: Medicare Other | Admitting: Internal Medicine

## 2020-07-22 ENCOUNTER — Encounter: Payer: Self-pay | Admitting: Internal Medicine

## 2020-07-22 DIAGNOSIS — R059 Cough, unspecified: Secondary | ICD-10-CM | POA: Diagnosis not present

## 2020-07-22 DIAGNOSIS — J029 Acute pharyngitis, unspecified: Secondary | ICD-10-CM

## 2020-07-22 DIAGNOSIS — G4700 Insomnia, unspecified: Secondary | ICD-10-CM

## 2020-07-22 DIAGNOSIS — R0982 Postnasal drip: Secondary | ICD-10-CM | POA: Diagnosis not present

## 2020-07-22 DIAGNOSIS — J329 Chronic sinusitis, unspecified: Secondary | ICD-10-CM

## 2020-07-22 MED ORDER — IPRATROPIUM BROMIDE 0.03 % NA SOLN
2.0000 | Freq: Three times a day (TID) | NASAL | 2 refills | Status: DC
Start: 2020-07-22 — End: 2021-01-26

## 2020-07-22 MED ORDER — SALINE SPRAY 0.65 % NA SOLN: 1.0000 | Freq: Every day | NASAL | 2 refills | Status: DC | PRN

## 2020-07-22 MED ORDER — ZOLPIDEM TARTRATE 10 MG PO TABS: 10.0000 mg | ORAL_TABLET | Freq: Every evening | ORAL | 2 refills | Status: DC | PRN

## 2020-07-22 MED ORDER — AZITHROMYCIN 250 MG PO TABS
ORAL_TABLET | ORAL | 0 refills | Status: DC
Start: 2020-07-22 — End: 2021-01-26

## 2020-07-22 NOTE — Progress Notes (Signed)
Virtual Visit via Video Note  I connected with Tony Lawrence  on 07/22/20 at  3:00 PM EDT by a video enabled telemedicine application and verified that I am speaking with the correct person using two identifiers.  Location patient: home, [place] Location provider:work or home office Persons participating in the virtual visit: patient, provider, pts wife Missy  I discussed the limitations of evaluation and management by telemedicine and the availability of in person appointments. The patient expressed understanding and agreed to proceed.   HPI: Mid last week c/o PND, sore throat, sinus pain and pressure, cough though feels cough is improved and reduce sleep due to cough and fatigue ambien 10 mg is helping with sleep and needs a refill and cough is slight. He has tired robitussin for cough and hot tea.  Denies fever, body aches. He has been walking in public w/o a mask   -WGNFA-21 vaccine status: 3/3  ROS: See pertinent positives and negatives per HPI.  Past Medical History:  Diagnosis Date  . Actinic keratoses   . Allergy   . Arthritis    low back pain s/p shots and blocks, knee pain  . BCC (basal cell carcinoma of skin)    forehead and chest Dr. Evorn Gong q 6 months   . CAD (coronary artery disease)   . Complication of anesthesia    SEVERE SORE THROAT AFTER BIOPSY 2010  . Coronary artery disease    Chronic total occlusion of mid LAD  . Diabetes mellitus without complication ([place])   . GERD (gastroesophageal reflux disease)   . History of chicken pox   . History of shingles   . Hyperlipidemia   . Hypertension   . Hypothyroidism   . OSA on CPAP   . Squamous cell carcinoma in situ    nose 2021   . 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 Tami Ribas, [facility]     Past Surgical History:  Procedure Laterality Date  . 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 CARDIAC CATHETERIZATION N/A 11/10/2015   Procedure: Left Heart Cath and Coronary  Angiography;  Surgeon: Wellington Hampshire, MD;  Location: [place] [facility];  Service: Cardiovascular;  Laterality: N/A;  . COLONOSCOPY  05/2006  . COLONOSCOPY WITH PROPOFOL N/A 05/24/2017   Procedure: COLONOSCOPY WITH PROPOFOL;  Surgeon: Lollie Sails, MD;  Location: [facility] ENDOSCOPY;  Service: Endoscopy;  Laterality: N/A;  . EXCISION OF TONGUE LESION N/A 11/11/2015   Procedure: EXCISION OF TONGUE LESION/ WITH FROZEN SECTION;  Surgeon: Beverly Gust, MD;  Location: [facility] ORS;  Service: ENT;  Laterality: N/A;  . REPLACEMENT TOTAL KNEE Left   . right tka minimally invasive 05/06/20 Dr. Armandina Gemma       Current Outpatient Medications:  .  Acetaminophen (TYLENOL PO), Take by mouth., Disp: , Rfl:  .  aspirin 81 MG tablet, Take 81 mg by mouth daily., Disp: , Rfl:  .  atorvastatin (LIPITOR) 20 MG tablet, Take 1 tablet (20 mg total) by mouth daily at 6 PM. At night, Disp: 90 tablet, Rfl: 3 .  azithromycin (ZITHROMAX) 250 MG tablet, 2 pills day 1 and 1 pill day 2-5 with food, Disp: 6 tablet, Rfl: 0 .  bisoprolol-hydrochlorothiazide (ZIAC) 2.5-6.25 MG tablet, Take 1 tablet by mouth daily., Disp: 90 tablet, Rfl: 3 .  clopidogrel (PLAVIX) 75 MG tablet, TAKE ONE TABLET BY MOUTH DAILY, Disp: 90 tablet, Rfl: 1 .  empagliflozin (JARDIANCE) 25 MG TABS tablet, Take 1 tablet (25 mg total) by mouth daily., Disp: 90 tablet, Rfl: 3 .  glimepiride (AMARYL) 1 MG tablet, TAKE ONE TABLET BY MOUTH EVERY MORNING AND TAKE ONE TABLET BY MOUTH WITH SUPPER, Disp: 180 tablet, Rfl: 3 .  glucose blood test strip, E 11.9 qd Once Daily one touch, Disp: 100 each, Rfl: 12 .  HYDROcodone-acetaminophen (NORCO) 10-325 MG tablet, Take 1 tablet by mouth every 4 (four) hours as needed., Disp: , Rfl:  .  ipratropium (ATROVENT) 0.03 % nasal spray, Place 2 sprays into both nostrils 3 (three) times daily. Prn, Disp: 30 mL, Rfl: 2 .  isosorbide mononitrate (IMDUR) 30 MG 24 hr tablet, TAKE ONE TABLET BY MOUTH EVERY MORNING AND TWO  TABLETS EVERY EVENING, Disp: 270 tablet, Rfl: 1 .  Lancets (ONETOUCH ULTRASOFT) lancets, Qd E 11.9 Once daily, Disp: 100 each, Rfl: 12 .  levothyroxine (SYNTHROID) 200 MCG tablet, Take 1 tablet (200 mcg total) by mouth daily. In am on empty stomach, Disp: 90 tablet, Rfl: 3 .  liothyronine (CYTOMEL) 5 MCG tablet, Take 0.5 tablets (2.5 mcg total) by mouth every other day., Disp: 45 tablet, Rfl: 3 .  losartan (COZAAR) 25 MG tablet, Take 1 tablet (25 mg total) by mouth daily. Stop ramipril 20 mg qd, Disp: 90 tablet, Rfl: 3 .  metFORMIN (GLUCOPHAGE) 1000 MG tablet, Take 1 tablet (1,000 mg total) by mouth daily with breakfast., Disp: 90 tablet, Rfl: 3 .  Multiple Vitamins-Minerals (CENTRUM SILVER 50+MEN PO), Take 1 tablet by mouth every morning. , Disp: , Rfl:  .  pantoprazole (PROTONIX) 40 MG tablet, Take 1 tablet (40 mg total) by mouth daily., Disp: 90 tablet, Rfl: 3 .  pregabalin (LYRICA) 50 MG capsule, Take 50 mg by mouth daily at 12 noon., Disp: , Rfl:  .  sodium chloride (OCEAN) 0.65 % SOLN nasal spray, Place 1 spray into both nostrils daily as needed for congestion., Disp: 30 mL, Rfl: 2 .  zolpidem (AMBIEN) 10 MG tablet, Take 1 tablet (10 mg total) by mouth at bedtime as needed for sleep., Disp: 30 tablet, Rfl: 2  EXAM:  VITALS per patient if applicable:  GENERAL: alert, oriented, appears well and in no acute distress  HEENT: atraumatic, conjunttiva clear, no obvious abnormalities on inspection of external nose and ears  NECK: normal movements of the head and neck  LUNGS: on inspection no signs of respiratory distress, breathing rate appears normal, no obvious gross SOB, gasping or wheezing  CV: no obvious cyanosis  MS: moves all visible extremities without noticeable abnormality  PSYCH/NEURO: pleasant and cooperative, no obvious depression or anxiety, speech and thought processing grossly intact  ASSESSMENT AND PLAN:  Discussed the following assessment and plan:  Sinusitis,  unspecified chronicity, unspecified location - Plan: azithromycin (ZITHROMAX) 250 MG tablet PND (post-nasal drip) - Plan: sodium chloride (OCEAN) 0.65 % SOLN nasal spray, ipratropium (ATROVENT) 0.03 % nasal spray Sore throat - Plan: azithromycin (ZITHROMAX) 250 MG tablet Cough - Plan: azithromycin (ZITHROMAX) 250 MG tablet Continue robitussin dm  Test for covid 19 home test consider formal covid testing in the future  Insomnia, unspecified type ambien 10 mg qhs    -we discussed possible serious and likely etiologies, options for evaluation and workup, limitations of telemedicine visit vs in person visit, treatment, treatment risks and precautions.   I discussed the assessment and treatment plan with the patient. The patient was provided an opportunity to ask questions and all were answered. The patient  agreed with the plan and demonstrated an understanding of the instructions.    Time spent 20 min Delorise Jackson, MD

## 2020-07-24 ENCOUNTER — Other Ambulatory Visit: Payer: Self-pay | Admitting: Internal Medicine

## 2020-07-24 ENCOUNTER — Encounter: Payer: Self-pay | Admitting: Internal Medicine

## 2020-07-24 MED ORDER — ZOLPIDEM TARTRATE 10 MG PO TABS
10.0000 mg | ORAL_TABLET | Freq: Every evening | ORAL | 2 refills | Status: DC | PRN
Start: 2020-07-24 — End: 2020-11-18

## 2020-07-24 NOTE — Telephone Encounter (Signed)
Kiester, Joaquim Lai Morriss "MISSY"  McLean-Scocuzza, Nino Glow, MD 17 hours ago (2:03 PM)      [facility] [place].  We have gone back to [facility] after finding out [facility] is not a preferred [facility] for our insurance.  Would you please [facility] the [facility] at [facility] and send it to [facility] for [facility] 1945-05-16.  We apologize for the extra work.

## 2020-07-25 DIAGNOSIS — M25561 Pain in right knee: Secondary | ICD-10-CM | POA: Diagnosis not present

## 2020-07-25 DIAGNOSIS — R262 Difficulty in walking, not elsewhere classified: Secondary | ICD-10-CM | POA: Diagnosis not present

## 2020-07-25 DIAGNOSIS — M25661 Stiffness of right knee, not elsewhere classified: Secondary | ICD-10-CM | POA: Diagnosis not present

## 2020-07-28 DIAGNOSIS — M25561 Pain in right knee: Secondary | ICD-10-CM | POA: Diagnosis not present

## 2020-07-28 DIAGNOSIS — M25661 Stiffness of right knee, not elsewhere classified: Secondary | ICD-10-CM | POA: Diagnosis not present

## 2020-07-28 DIAGNOSIS — R262 Difficulty in walking, not elsewhere classified: Secondary | ICD-10-CM | POA: Diagnosis not present

## 2020-07-30 DIAGNOSIS — C029 Malignant neoplasm of tongue, unspecified: Secondary | ICD-10-CM | POA: Diagnosis not present

## 2020-07-30 DIAGNOSIS — C760 Malignant neoplasm of head, face and neck: Secondary | ICD-10-CM | POA: Diagnosis not present

## 2020-07-30 DIAGNOSIS — C01 Malignant neoplasm of base of tongue: Secondary | ICD-10-CM | POA: Diagnosis not present

## 2020-07-30 DIAGNOSIS — K149 Disease of tongue, unspecified: Secondary | ICD-10-CM | POA: Diagnosis not present

## 2020-07-30 DIAGNOSIS — Z09 Encounter for follow-up examination after completed treatment for conditions other than malignant neoplasm: Secondary | ICD-10-CM | POA: Diagnosis not present

## 2020-07-30 DIAGNOSIS — C069 Malignant neoplasm of mouth, unspecified: Secondary | ICD-10-CM | POA: Diagnosis not present

## 2020-07-31 DIAGNOSIS — R262 Difficulty in walking, not elsewhere classified: Secondary | ICD-10-CM | POA: Diagnosis not present

## 2020-07-31 DIAGNOSIS — M25561 Pain in right knee: Secondary | ICD-10-CM | POA: Diagnosis not present

## 2020-07-31 DIAGNOSIS — M25661 Stiffness of right knee, not elsewhere classified: Secondary | ICD-10-CM | POA: Diagnosis not present

## 2020-08-01 DIAGNOSIS — C029 Malignant neoplasm of tongue, unspecified: Secondary | ICD-10-CM | POA: Insufficient documentation

## 2020-08-04 DIAGNOSIS — M25661 Stiffness of right knee, not elsewhere classified: Secondary | ICD-10-CM | POA: Diagnosis not present

## 2020-08-04 DIAGNOSIS — M25561 Pain in right knee: Secondary | ICD-10-CM | POA: Diagnosis not present

## 2020-08-04 DIAGNOSIS — R262 Difficulty in walking, not elsewhere classified: Secondary | ICD-10-CM | POA: Diagnosis not present

## 2020-08-06 ENCOUNTER — Telehealth: Payer: Self-pay | Admitting: Internal Medicine

## 2020-08-06 DIAGNOSIS — M25661 Stiffness of right knee, not elsewhere classified: Secondary | ICD-10-CM | POA: Diagnosis not present

## 2020-08-06 DIAGNOSIS — R262 Difficulty in walking, not elsewhere classified: Secondary | ICD-10-CM | POA: Diagnosis not present

## 2020-08-06 DIAGNOSIS — M25561 Pain in right knee: Secondary | ICD-10-CM | POA: Diagnosis not present

## 2020-08-06 NOTE — Telephone Encounter (Signed)
   [facility] Pre-operative Risk Assessment    Patient Name: Tony Lawrence  DOB: [birth year]  MRN: [id number]   HEARTCARE STAFF: - Please ensure there is not already an duplicate clearance open for this procedure. - Under Visit Info/Reason for Call, type in Other and utilize the format Clearance MM/DD/YY or Clearance TBD. Do not use dashes or single digits. - If request is for dental extraction, please clarify the # of teeth to be extracted.  Request for surgical clearance:  1. What type of surgery is being performed? Partial glossectomy - neck disecction  2. When is this surgery scheduled? 08/25/20  3. What type of clearance is required (medical clearance vs. Pharmacy clearance to hold med vs. Both)? both  4. Are there any medications that need to be held prior to surgery and how long? Plavix instructions   5. Practice name and name of physician performing surgery? [facility] - Dr Tiburcio Pea  6. What is the office phone number? [phone]   7.   What is the office fax number? [phone]  8.   Anesthesia type (None, local, MAC, general) ? General    Caryl Pina Gerringer 08/06/2020, 11:00 AM  _________________________________________________________________   (provider comments below)

## 2020-08-07 NOTE — Telephone Encounter (Signed)
As long as Mr. Dollins does not have any new cardiac symptoms, I think it is reasonable for him to proceed with surgery as planned.  Clopidogrel can be held 5 days prior to the procedure and restarted when it is felt safe to do so by his ENT surgeon.  Appropriate DVT prophylaxis should be administered given history of DVT in the setting of prior orthopedic surgery.  Nelva Bush, MD [facility]

## 2020-08-07 NOTE — Telephone Encounter (Signed)
Pre-op request for neck dissection ?(head and neck surgery-[facility] says glossectomy for tongue cancer)  requesting instruction son Plavix.   H/o CAD with chronic total occlusion of the mLAD, HTN, HLD, DM2, OSA, hypothyroidism, mild MR by echo. Also h/o of left total knee replacement, and was subsequently diagnosed with acute left lower DVT after an extended car trip, prompting transition from plavix to Eliquis. He completed 4 months of Eliquis and was transitioned back to Plavix.   Patient was last seen 8/27/21for pre-op eval for right total knee replacement. He was stable from a cardiac standpoint. He was on Aspirin and Plavix, plan for indefinite DAPT. Echo was ordered to evaluate valve disease. Echo showed LVEF 60-65%, G2DD, mild MS, mild AI, mild aortic dilation. CTA chest was ordered to evaluate the aorta, which has not been performed.  He was ultimately cleared for knee replacement.   Dr. Saunders Revel please comment on Plavix and route to P CV DIV PREOP. Thanks!

## 2020-08-07 NOTE — Telephone Encounter (Signed)
   Name: Tony Lawrence  DOB: [birth year]  MRN: [id number]   Primary Cardiologist: Nelva Bush, MD  Chart reviewed as part of pre-operative protocol coverage. Patient was contacted 08/07/2020 in reference to pre-operative risk assessment for pending surgery as outlined below.  Tony Lawrence was last seen on 12/19/19 by *Dr. Saunders Revel.  Since that day, Tony Lawrence has done well from a cardiac perspective, denies anginal symptoms. Per Dr. Saunders Revel, OK to hold Plavix 5 days before surgery. Given h/o DVT in the setting of prior orthopedic surgery, appropriate DVT prophylaxis should be administered, per surgeon's office.  Therefore, based on ACC/AHA guidelines, the patient would be at acceptable risk for the planned procedure without further cardiovascular testing.   The patient was advised that if he develops new symptoms prior to surgery to contact our office to arrange for a follow-up visit, and he verbalized understanding.  I will route this recommendation to the requesting party via Epic fax function and remove from pre-op pool. Please call with questions.  Gianelle Mccaul Ninfa Meeker, PA-C 08/07/2020, 11:24 AM

## 2020-08-12 DIAGNOSIS — C029 Malignant neoplasm of tongue, unspecified: Secondary | ICD-10-CM | POA: Diagnosis not present

## 2020-08-12 DIAGNOSIS — E039 Hypothyroidism, unspecified: Secondary | ICD-10-CM | POA: Diagnosis not present

## 2020-08-12 DIAGNOSIS — Z86718 Personal history of other venous thrombosis and embolism: Secondary | ICD-10-CM | POA: Diagnosis not present

## 2020-08-12 DIAGNOSIS — I2510 Atherosclerotic heart disease of native coronary artery without angina pectoris: Secondary | ICD-10-CM | POA: Diagnosis not present

## 2020-08-12 DIAGNOSIS — I10 Essential (primary) hypertension: Secondary | ICD-10-CM | POA: Diagnosis not present

## 2020-08-18 DIAGNOSIS — L570 Actinic keratosis: Secondary | ICD-10-CM | POA: Diagnosis not present

## 2020-08-18 DIAGNOSIS — D0421 Carcinoma in situ of skin of right ear and external auricular canal: Secondary | ICD-10-CM | POA: Diagnosis not present

## 2020-08-18 DIAGNOSIS — Z85828 Personal history of other malignant neoplasm of skin: Secondary | ICD-10-CM | POA: Diagnosis not present

## 2020-08-18 DIAGNOSIS — D485 Neoplasm of uncertain behavior of skin: Secondary | ICD-10-CM | POA: Diagnosis not present

## 2020-08-18 DIAGNOSIS — X32XXXA Exposure to sunlight, initial encounter: Secondary | ICD-10-CM | POA: Diagnosis not present

## 2020-08-18 DIAGNOSIS — D2262 Melanocytic nevi of left upper limb, including shoulder: Secondary | ICD-10-CM | POA: Diagnosis not present

## 2020-08-18 DIAGNOSIS — D2271 Melanocytic nevi of right lower limb, including hip: Secondary | ICD-10-CM | POA: Diagnosis not present

## 2020-08-18 DIAGNOSIS — D2261 Melanocytic nevi of right upper limb, including shoulder: Secondary | ICD-10-CM | POA: Diagnosis not present

## 2020-08-24 HISTORY — PX: OTHER SURGICAL HISTORY: SHX169

## 2020-08-25 DIAGNOSIS — C770 Secondary and unspecified malignant neoplasm of lymph nodes of head, face and neck: Secondary | ICD-10-CM | POA: Diagnosis present

## 2020-08-25 DIAGNOSIS — E1165 Type 2 diabetes mellitus with hyperglycemia: Secondary | ICD-10-CM | POA: Diagnosis not present

## 2020-08-25 DIAGNOSIS — E119 Type 2 diabetes mellitus without complications: Secondary | ICD-10-CM | POA: Diagnosis present

## 2020-08-25 DIAGNOSIS — Z20822 Contact with and (suspected) exposure to covid-19: Secondary | ICD-10-CM | POA: Diagnosis present

## 2020-08-25 DIAGNOSIS — R638 Other symptoms and signs concerning food and fluid intake: Secondary | ICD-10-CM | POA: Diagnosis not present

## 2020-08-25 DIAGNOSIS — Z880 Allergy status to penicillin: Secondary | ICD-10-CM | POA: Diagnosis not present

## 2020-08-25 DIAGNOSIS — C021 Malignant neoplasm of border of tongue: Secondary | ICD-10-CM | POA: Diagnosis not present

## 2020-08-25 DIAGNOSIS — E039 Hypothyroidism, unspecified: Secondary | ICD-10-CM | POA: Diagnosis present

## 2020-08-25 DIAGNOSIS — I10 Essential (primary) hypertension: Secondary | ICD-10-CM | POA: Diagnosis present

## 2020-08-25 DIAGNOSIS — C029 Malignant neoplasm of tongue, unspecified: Secondary | ICD-10-CM | POA: Diagnosis not present

## 2020-08-25 DIAGNOSIS — I2582 Chronic total occlusion of coronary artery: Secondary | ICD-10-CM | POA: Diagnosis present

## 2020-08-25 DIAGNOSIS — Z7984 Long term (current) use of oral hypoglycemic drugs: Secondary | ICD-10-CM | POA: Diagnosis not present

## 2020-08-25 DIAGNOSIS — Z7902 Long term (current) use of antithrombotics/antiplatelets: Secondary | ICD-10-CM | POA: Diagnosis not present

## 2020-08-25 DIAGNOSIS — Z86718 Personal history of other venous thrombosis and embolism: Secondary | ICD-10-CM | POA: Diagnosis not present

## 2020-08-25 DIAGNOSIS — Z79890 Hormone replacement therapy (postmenopausal): Secondary | ICD-10-CM | POA: Diagnosis not present

## 2020-08-25 DIAGNOSIS — G4733 Obstructive sleep apnea (adult) (pediatric): Secondary | ICD-10-CM | POA: Diagnosis not present

## 2020-08-25 DIAGNOSIS — Z7982 Long term (current) use of aspirin: Secondary | ICD-10-CM | POA: Diagnosis not present

## 2020-08-25 DIAGNOSIS — I2510 Atherosclerotic heart disease of native coronary artery without angina pectoris: Secondary | ICD-10-CM | POA: Diagnosis present

## 2020-08-25 DIAGNOSIS — Z4682 Encounter for fitting and adjustment of non-vascular catheter: Secondary | ICD-10-CM | POA: Diagnosis not present

## 2020-08-25 DIAGNOSIS — Z79899 Other long term (current) drug therapy: Secondary | ICD-10-CM | POA: Diagnosis not present

## 2020-08-25 DIAGNOSIS — Z01812 Encounter for preprocedural laboratory examination: Secondary | ICD-10-CM | POA: Diagnosis not present

## 2020-09-02 ENCOUNTER — Encounter: Payer: Self-pay | Admitting: Internal Medicine

## 2020-09-02 DIAGNOSIS — C029 Malignant neoplasm of tongue, unspecified: Secondary | ICD-10-CM | POA: Insufficient documentation

## 2020-09-02 DIAGNOSIS — C4492 Squamous cell carcinoma of skin, unspecified: Secondary | ICD-10-CM | POA: Insufficient documentation

## 2020-09-05 DIAGNOSIS — L24A9 Irritant contact dermatitis due friction or contact with other specified body fluids: Secondary | ICD-10-CM | POA: Diagnosis not present

## 2020-09-10 DIAGNOSIS — Z09 Encounter for follow-up examination after completed treatment for conditions other than malignant neoplasm: Secondary | ICD-10-CM | POA: Diagnosis not present

## 2020-09-10 DIAGNOSIS — L24A9 Irritant contact dermatitis due friction or contact with other specified body fluids: Secondary | ICD-10-CM | POA: Diagnosis not present

## 2020-09-10 DIAGNOSIS — C029 Malignant neoplasm of tongue, unspecified: Secondary | ICD-10-CM | POA: Diagnosis not present

## 2020-09-18 DIAGNOSIS — C029 Malignant neoplasm of tongue, unspecified: Secondary | ICD-10-CM | POA: Diagnosis not present

## 2020-09-19 ENCOUNTER — Telehealth: Payer: Self-pay | Admitting: Internal Medicine

## 2020-09-19 ENCOUNTER — Other Ambulatory Visit: Payer: Self-pay

## 2020-09-19 MED ORDER — CLOPIDOGREL BISULFATE 75 MG PO TABS: 1.0000 | ORAL_TABLET | Freq: Every day | ORAL | 1 refills | Status: DC

## 2020-09-19 NOTE — Telephone Encounter (Signed)
*  STAT* If patient is at the pharmacy, call can be transferred to refill team.   1. Which medications need to be refilled? (please list name of each medication and dose if known) clopidogrel 75 MG 1 tablet daily   2. Which pharmacy/location (including street and city if local pharmacy) is medication to be sent to? [facility]  3. Do they need a 30 day or 90 day supply? 90 day

## 2020-09-19 NOTE — Telephone Encounter (Signed)
clopidogrel (PLAVIX) 75 MG tablet 90 tablet 1 09/19/2020    Sig - Route: Take 1 tablet (75 mg total) by mouth daily. - Oral   Sent to pharmacy as: clopidogrel (PLAVIX) 75 MG tablet   E-Prescribing Status: Receipt confirmed by pharmacy (09/19/2020 12:12 PM EDT)     Pharmacy  [facility], [place]

## 2020-09-24 DIAGNOSIS — D0422 Carcinoma in situ of skin of left ear and external auricular canal: Secondary | ICD-10-CM | POA: Diagnosis not present

## 2020-10-03 DIAGNOSIS — M26622 Arthralgia of left temporomandibular joint: Secondary | ICD-10-CM | POA: Diagnosis not present

## 2020-10-03 DIAGNOSIS — C029 Malignant neoplasm of tongue, unspecified: Secondary | ICD-10-CM | POA: Diagnosis not present

## 2020-10-03 DIAGNOSIS — Z08 Encounter for follow-up examination after completed treatment for malignant neoplasm: Secondary | ICD-10-CM | POA: Diagnosis not present

## 2020-10-06 ENCOUNTER — Other Ambulatory Visit: Payer: Self-pay | Admitting: Internal Medicine

## 2020-10-07 ENCOUNTER — Other Ambulatory Visit: Payer: Self-pay | Admitting: Internal Medicine

## 2020-10-07 DIAGNOSIS — E119 Type 2 diabetes mellitus without complications: Secondary | ICD-10-CM

## 2020-10-07 DIAGNOSIS — E039 Hypothyroidism, unspecified: Secondary | ICD-10-CM

## 2020-10-07 MED ORDER — LIOTHYRONINE SODIUM 5 MCG PO TABS: 2.5000 ug | ORAL_TABLET | ORAL | 3 refills | Status: DC

## 2020-10-07 MED ORDER — METFORMIN HCL 1000 MG PO TABS: 1000.0000 mg | ORAL_TABLET | Freq: Every day | ORAL | 3 refills | Status: DC

## 2020-10-28 DIAGNOSIS — Z20822 Contact with and (suspected) exposure to covid-19: Secondary | ICD-10-CM | POA: Diagnosis not present

## 2020-11-16 ENCOUNTER — Encounter: Payer: Self-pay | Admitting: Internal Medicine

## 2020-11-17 DIAGNOSIS — J22 Unspecified acute lower respiratory infection: Secondary | ICD-10-CM | POA: Diagnosis not present

## 2020-11-17 DIAGNOSIS — R059 Cough, unspecified: Secondary | ICD-10-CM | POA: Diagnosis not present

## 2020-11-17 DIAGNOSIS — Z03818 Encounter for observation for suspected exposure to other biological agents ruled out: Secondary | ICD-10-CM | POA: Diagnosis not present

## 2020-11-17 DIAGNOSIS — U071 COVID-19: Secondary | ICD-10-CM | POA: Diagnosis not present

## 2020-11-17 NOTE — Telephone Encounter (Signed)
Please advise 

## 2020-11-17 NOTE — Telephone Encounter (Signed)
Spoke with Patient's wife and they are agreeable to be seen at urgent care this morning. States they will go now. States they would like to keep the appointment with Dr Olivia Mackie McLean-Scocuzza for a follow up from urgent care.

## 2020-11-18 ENCOUNTER — Encounter: Payer: Self-pay | Admitting: Internal Medicine

## 2020-11-18 ENCOUNTER — Telehealth (INDEPENDENT_AMBULATORY_CARE_PROVIDER_SITE_OTHER): Payer: Medicare Other | Admitting: Internal Medicine

## 2020-11-18 ENCOUNTER — Other Ambulatory Visit: Payer: Self-pay

## 2020-11-18 VITALS — BP 125/61 | Ht 70.0 in | Wt 239.0 lb

## 2020-11-18 DIAGNOSIS — E1159 Type 2 diabetes mellitus with other circulatory complications: Secondary | ICD-10-CM | POA: Diagnosis not present

## 2020-11-18 DIAGNOSIS — E119 Type 2 diabetes mellitus without complications: Secondary | ICD-10-CM

## 2020-11-18 DIAGNOSIS — I2510 Atherosclerotic heart disease of native coronary artery without angina pectoris: Secondary | ICD-10-CM

## 2020-11-18 DIAGNOSIS — J9801 Acute bronchospasm: Secondary | ICD-10-CM

## 2020-11-18 DIAGNOSIS — I152 Hypertension secondary to endocrine disorders: Secondary | ICD-10-CM

## 2020-11-18 DIAGNOSIS — U071 COVID-19: Secondary | ICD-10-CM | POA: Diagnosis not present

## 2020-11-18 DIAGNOSIS — E039 Hypothyroidism, unspecified: Secondary | ICD-10-CM

## 2020-11-18 DIAGNOSIS — I10 Essential (primary) hypertension: Secondary | ICD-10-CM | POA: Diagnosis not present

## 2020-11-18 DIAGNOSIS — R059 Cough, unspecified: Secondary | ICD-10-CM | POA: Diagnosis not present

## 2020-11-18 DIAGNOSIS — K219 Gastro-esophageal reflux disease without esophagitis: Secondary | ICD-10-CM | POA: Diagnosis not present

## 2020-11-18 MED ORDER — PANTOPRAZOLE SODIUM 40 MG PO TBEC: 40.0000 mg | DELAYED_RELEASE_TABLET | Freq: Every day | ORAL | 3 refills | Status: DC

## 2020-11-18 MED ORDER — ATORVASTATIN CALCIUM 20 MG PO TABS: 20.0000 mg | ORAL_TABLET | Freq: Every day | ORAL | 3 refills | Status: DC

## 2020-11-18 MED ORDER — BISOPROLOL-HYDROCHLOROTHIAZIDE 2.5-6.25 MG PO TABS: 1.0000 | ORAL_TABLET | Freq: Every day | ORAL | 3 refills | Status: DC

## 2020-11-18 MED ORDER — GLIMEPIRIDE 1 MG PO TABS: ORAL_TABLET | ORAL | 3 refills | Status: DC

## 2020-11-18 MED ORDER — BENZONATATE 200 MG PO CAPS: 200.0000 mg | ORAL_CAPSULE | Freq: Three times a day (TID) | ORAL | 0 refills | Status: DC | PRN

## 2020-11-18 MED ORDER — VENTOLIN HFA 108 (90 BASE) MCG/ACT IN AERS: 1.0000 | INHALATION_SPRAY | Freq: Four times a day (QID) | RESPIRATORY_TRACT | 2 refills | Status: DC | PRN

## 2020-11-18 MED ORDER — ZOLPIDEM TARTRATE 10 MG PO TABS: 10.0000 mg | ORAL_TABLET | Freq: Every evening | ORAL | 2 refills | Status: DC | PRN

## 2020-11-18 NOTE — Progress Notes (Signed)
Patient went to [facility] clinic urgent care and was treated for COVID. States he is only have some chest discomfort.Marland Kitchen

## 2020-11-18 NOTE — Progress Notes (Signed)
Virtual Visit via Video Note  I connected with Tony Lawrence   on 11/18/20 at  8:20 AM EDT by a video enabled telemedicine application and verified that I am speaking with the correct person using two identifiers.  Location patient: home, [place] Location provider:work or home office Persons participating in the virtual visit: patient, provider  I discussed the limitations of evaluation and management by telemedicine and the availability of in person appointments. The patient expressed understanding and agreed to proceed.   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 Ihor Austin in 11/2020 wondering if in addition he needs oncology he decided not to do radiation due to side effects but overall doing well from tongue cancer recurrence  Covid 19 + 10 days from 11/18/20 did home test wife had before him a week before vacation but was on family vaction and 9-10/14 ppd contracted covid 19 O2 95% yesterday and low as 90% but overall 92-96% and normally 98% he had fever resolved dry cough loss of taste and smell and taking a deep breath difficult doing better today after [facility] did cxr with covid changes and given albuterol, azm, prednisone and tessalon perles  -COVID-19 vaccine status: 3/3   ROS: See pertinent positives and negatives per HPI.  Past Medical History:  Diagnosis Date   Actinic keratoses    Allergy    Arthritis    low back pain s/p shots and blocks, knee pain   BCC (basal cell carcinoma of skin)    forehead and chest Dr. Evorn Gong q 6 months    CAD (coronary artery disease)    Complication of anesthesia    SEVERE SORE THROAT AFTER BIOPSY 2010   Coronary artery disease    Chronic total occlusion of mid LAD   COVID-19    11/17/20   Diabetes mellitus without complication (HCC)    GERD (gastroesophageal reflux disease)     History of chicken pox    History of shingles    Hyperlipidemia    Hypertension    Hypothyroidism    OSA on CPAP    SCC (squamous cell carcinoma)    invasive left Post. lateral Tongue [facility] Dr. 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 Autumn Patty H/o     Past Surgical History:  Procedure Laterality Date   BIOPSY TONGUE     11/11/15 SCC tongue    CARDIAC CATHETERIZATION N/A 11/10/2015   Procedure: Left Heart Cath and Coronary Angiography;  Surgeon: Wellington Hampshire, MD;  Location: [place] [facility];  Service: Cardiovascular;  Laterality: N/A;   COLONOSCOPY  05/2006   COLONOSCOPY WITH PROPOFOL N/A 05/24/2017   Procedure: COLONOSCOPY WITH PROPOFOL;  Surgeon: Lollie Sails, MD;  Location: [facility] END[facility];  Service: Endoscopy;  Laterality: N/A;   EXCISION OF TONGUE LESION N/A 11/11/2015   Procedure: EXCISION OF TONGUE LESION/ WITH FROZEN SECTION;  Surgeon: Beverly Gust, MD;  Location: [facility] ORS;  Service: ENT;  Laterality: N/A;   REPLACEMENT TOTAL KNEE Left    right tka minimally invasive 05/06/20 Dr. Armandina Gemma       Current Outpatient Medications:  Acetaminophen (TYLENOL PO), Take by mouth., Disp: , Rfl:    aspirin 81 MG tablet, Take 81 mg by mouth daily., Disp: , Rfl:    azithromycin (ZITHROMAX) 250 MG tablet, 2 pills day 1 and 1 pill day 2-5 with food, Disp: 6 tablet, Rfl: 0   clopidogrel (PLAVIX) 75 MG tablet, Take 1 tablet (75 mg total) by mouth daily., Disp: 90 tablet, Rfl: 1   empagliflozin (JARDIANCE) 25 MG TABS tablet, Take 1 tablet (25 mg total) by mouth daily., Disp: 90 tablet, Rfl: 3   glucose blood test strip, E 11.9 qd Once Daily one touch, Disp: 100 each, Rfl: 12   ipratropium (ATROVENT) 0.03 % nasal spray, Place 2 sprays into both nostrils 3 (three) times daily. Prn, Disp: 30 mL, Rfl: 2    isosorbide mononitrate (IMDUR) 30 MG 24 hr tablet, TAKE ONE TABLET BY MOUTH EVERY MORNING AND TAKE TWO TABLETS BY MOUTH EVERY EVENING, Disp: 270 tablet, Rfl: 0   Lancets (ONETOUCH ULTRASOFT) lancets, Qd E 11.9 Once daily, Disp: 100 each, Rfl: 12   levothyroxine (SYNTHROID) 200 MCG tablet, Take 1 tablet (200 mcg total) by mouth daily. In am on empty stomach, Disp: 90 tablet, Rfl: 3   liothyronine (CYTOMEL) 5 MCG tablet, Take 0.5 tablets (2.5 mcg total) by mouth every other day., Disp: 45 tablet, Rfl: 3   losartan (COZAAR) 25 MG tablet, Take 1 tablet (25 mg total) by mouth daily. Stop ramipril 20 mg qd, Disp: 90 tablet, Rfl: 3   metFORMIN (GLUCOPHAGE) 1000 MG tablet, Take 1 tablet (1,000 mg total) by mouth daily with breakfast., Disp: 90 tablet, Rfl: 3   Multiple Vitamins-Minerals (CENTRUM SILVER 50+MEN PO), Take 1 tablet by mouth every morning. , Disp: , Rfl:    predniSONE (DELTASONE) 20 MG tablet, Take by mouth., Disp: , Rfl:    atorvastatin (LIPITOR) 20 MG tablet, Take 1 tablet (20 mg total) by mouth daily at 6 PM. At night, Disp: 90 tablet, Rfl: 3   benzonatate (TESSALON) 200 MG capsule, Take 1 capsule (200 mg total) by mouth 3 (three) times daily as needed for cough., Disp: 40 capsule, Rfl: 0   bisoprolol-hydrochlorothiazide (ZIAC) 2.5-6.25 MG tablet, Take 1 tablet by mouth daily., Disp: 90 tablet, Rfl: 3   glimepiride (AMARYL) 1 MG tablet, TAKE ONE TABLET BY MOUTH EVERY MORNING AND TAKE ONE TABLET BY MOUTH WITH SUPPER, Disp: 180 tablet, Rfl: 3   pantoprazole (PROTONIX) 40 MG tablet, Take 1 tablet (40 mg total) by mouth daily., Disp: 90 tablet, Rfl: 3   pregabalin (LYRICA) 50 MG capsule, Take 50 mg by mouth daily at 12 noon. (Patient not taking: Reported on 11/18/2020), Disp: , Rfl:    sodium chloride (OCEAN) 0.65 % SOLN nasal spray, Place 1 spray into both nostrils daily as needed for congestion. (Patient not taking: Reported on 11/18/2020), Disp: 30 mL, Rfl: 2   VENTOLIN HFA 108 (90 Base) MCG/ACT  inhaler, Inhale 1-2 puffs into the lungs every 6 (six) hours as needed for wheezing or shortness of breath., Disp: 18 g, Rfl: 2   zolpidem (AMBIEN) 10 MG tablet, Take 1 tablet (10 mg total) by mouth at bedtime as needed for sleep., Disp: 30 tablet, Rfl: 2  EXAM:  VITALS per patient if applicable:  GENERAL: alert, oriented, appears well and in no acute distress  HEENT: atraumatic, conjunttiva clear, no obvious abnormalities on inspection of external nose and ears  NECK: normal movements of the head and neck  LUNGS: on inspection no signs of  respiratory distress, breathing rate appears normal, no obvious gross SOB, gasping or wheezing  CV: no obvious cyanosis  MS: moves all visible extremities without noticeable abnormality  PSYCH/NEURO: pleasant and cooperative, no obvious depression or anxiety, speech and thought processing grossly intact  ASSESSMENT AND PLAN:  Discussed the following assessment and plan:  Cough - Plan: benzonatate (TESSALON) 200 MG capsule, VENTOLIN HFA 108 (90 Base) MCG/ACT inhaler  COVID-19 - Plan: VENTOLIN HFA 108 (90 Base) MCG/ACT inhaler Doing better   Bronchospasm - Plan: VENTOLIN HFA 108 (90 Base) MCG/ACT inhaler  Type 2 diabetes mellitus without complication, without long-term current use of insulin (HCC) - Plan: atorvastatin (LIPITOR) 20 MG tablet, glimepiride (AMARYL) 1 MG tablet Cont chronic meds  Not addressed today f/u 02/2021   Coronary artery disease involving native coronary artery of native heart without angina pectoris - Plan: atorvastatin (LIPITOR) 20 MG tablet  Essential hypertension - Plan: bisoprolol-hydrochlorothiazide (ZIAC) 2.5-6.25 MG tablet  Gastroesophageal reflux disease without esophagitis - Plan: pantoprazole (PROTONIX) 40 MG tablet  Hypertension associated with diabetes ([place]) - Plan: Comprehensive metabolic panel, Lipid panel, CBC w/Diff, Hemoglobin A1c, Microalbumin / creatinine urine ratio  Hypothyroidism, unspecified  type - Plan: TSH  Screening for prostate cancer - Plan: PSA  Benign prostatic hyperplasia, unspecified whether lower urinary tract symptoms present - Plan: PSA  HM Flu shot utd prevnar had 02/03/18 Tdap had 08/02/2018  zostervax per pt had in the past  Had 2/2 shigrix  pna 23 utd  covid 19 vx 3/3   Colonoscopy 05/24/2017 diverticulosis no etiology for diarrhea -repeat in 10 years Dr. Gustavo Lah   PSA 0.90 h/o mild BPH on imaging f/u urology Dr. Bernardo Heater 04/23/20 f/u annually vs prn no further PSA testing rec.    Dr. Sharlet Salina as of 11/14/19 back issues ok  Derm Dr. Evorn Gong h/o [place] forehead and chest saw 9 or 01/2019 ln2 to scalp still unresolved as of 02/28/2019 rec call back for more ln2 before 1 year  Derm 05/2019 ln2 to scalp f/u in 6 months  Residual scalp Aks x 2 f/u dermatology, scc superficial nose tx'ed as of 11/14/19 Fu Q 6 months appt sch 07/28/20    ENT Dr. Bernestine Amass [facility] due for f/u 11/2020 Dr. Tami Ribas ENT ROI sent 11/14/19    [place] eye Dr. Merla Riches Dentist Dr. Eugenie Birks Cardiology Dr. Saunders Revel GI Dr. Gustavo Lah  Dr. Nash Mantis ortho right min. Invasive TKA 05/06/20 in [place]  Rec healthy diet and exercise  -we discussed possible serious and likely etiologies, options for evaluation and workup, limitations of telemedicine visit vs in person visit, treatment, treatment risks and precautions. Pt prefers to treat via telemedicine empirically rather than in person at this moment.     I discussed the assessment and treatment plan with the patient. The patient was provided an opportunity to ask questions and all were answered. The patient agreed with the plan and demonstrated an understanding of the instructions.     Nino Glow McLean-Scocuzza, MD

## 2020-11-21 ENCOUNTER — Encounter: Payer: Self-pay | Admitting: Internal Medicine

## 2020-11-27 ENCOUNTER — Ambulatory Visit (INDEPENDENT_AMBULATORY_CARE_PROVIDER_SITE_OTHER): Payer: Medicare Other | Admitting: Urology

## 2020-11-27 ENCOUNTER — Other Ambulatory Visit: Payer: Self-pay

## 2020-11-27 VITALS — BP 159/75 | HR 60 | Ht 70.0 in | Wt 235.0 lb

## 2020-11-27 DIAGNOSIS — N476 Balanoposthitis: Secondary | ICD-10-CM | POA: Diagnosis not present

## 2020-11-27 MED ORDER — CLOTRIMAZOLE-BETAMETHASONE 1-0.05 % EX CREA: TOPICAL_CREAM | CUTANEOUS | 0 refills | Status: DC

## 2020-11-28 ENCOUNTER — Encounter: Payer: Self-pay | Admitting: Urology

## 2020-11-28 NOTE — Progress Notes (Signed)
11/27/2020 7:16 AM   Tony Lawrence 04/10/1946 [id number]  Referring provider: McLean-Scocuzza, Nino Glow, MD [place],  [place] [address]  Chief Complaint  Patient presents with   penile rash    HPI: 75 y.o. male called for appointment for further evaluation of penile rash.  History of recurrent balanitis 5-6 weeks ago he had recurrent penile erythema and discomfort which resolved with Mycolog however he states it has returned x3 He states he has been on increased oral antibiotic therapy for knee problems + Diabetes Foreskin is tight but retractable No bothersome LUTS   PMH: Past Medical History:  Diagnosis Date   Actinic keratoses    Allergy    Arthritis    low back pain s/p shots and blocks, knee pain   BCC (basal cell carcinoma of skin)    forehead and chest Dr. 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 Tami Ribas, [facility]     Surgical History: Past Surgical History:  Procedure Laterality Date   BIOPSY TONGUE     11/11/15 SCC tongue    CARDIAC CATHETERIZATION N/A 11/10/2015   Procedure: Left Heart Cath and Coronary Angiography;  Surgeon: Wellington Hampshire, MD;  Location: [place] [facility];  Service: Cardiovascular;  Laterality: N/A;   COLONOSCOPY  05/2006   COLONOSCOPY WITH PROPOFOL N/A  05/24/2017   Procedure: COLONOSCOPY WITH PROPOFOL;  Surgeon: Lollie Sails, MD;  Location: [facility] ENDOSCOPY;  Service: Endoscopy;  Laterality: N/A;   EXCISION OF TONGUE LESION N/A 11/11/2015   Procedure: EXCISION OF TONGUE LESION/ WITH FROZEN SECTION;  Surgeon: Beverly Gust, MD;  Location: [facility] ORS;  Service: ENT;  Laterality: N/A;   REPLACEMENT TOTAL KNEE Left    right tka minimally invasive 05/06/20 Dr. Nash Mantis [facility]      Home Medications:  Allergies as of 11/27/2020       Reactions   Penicillins Hives, Rash, Other (See Comments), Swelling   Has patient had a PCN reaction causing immediate rash, facial/tongue/throat swelling, SOB or lightheadedness with hypotension: Yes Has patient had a PCN reaction causing severe rash involving mucus membranes or skin necrosis: No Has patient had a PCN reaction that required hospitalization No Has patient had a PCN reaction occurring within the last 10 years: No If all of the above answers are "NO", then may proceed with Cephalosporin use.        Medication List        Accurate as of November 27, 2020 11:59 PM. If you have any questions, ask your nurse or doctor.          aspirin 81 MG tablet Take 81 mg by mouth  daily.   atorvastatin 20 MG tablet Commonly known as: LIPITOR Take 1 tablet (20 mg total) by mouth daily at 6 PM. At night   azithromycin 250 MG tablet Commonly known as: ZITHROMAX 2 pills day 1 and 1 pill day 2-5 with food   benzonatate 200 MG capsule Commonly known as: TESSALON Take 1 capsule (200 mg total) by mouth 3 (three) times daily as needed for cough.   bisoprolol-hydrochlorothiazide 2.5-6.25 MG tablet Commonly known as: ZIAC Take 1 tablet by mouth daily.   CENTRUM SILVER 50+MEN PO Take 1 tablet by mouth every morning.   chlorhexidine 0.12 % solution Commonly known as: PERIDEX SMARTSIG:By Mouth   clopidogrel 75 MG tablet Commonly known as: PLAVIX Take 1 tablet (75 mg total) by mouth daily.    clotrimazole-betamethasone cream Commonly known as: Lotrisone Apply to affected area 2 times daily. Started by: Abbie Sons, MD   empagliflozin 25 MG Tabs tablet Commonly known as: Jardiance Take 1 tablet (25 mg total) by mouth daily.   glimepiride 1 MG tablet Commonly known as: AMARYL TAKE ONE TABLET BY MOUTH EVERY MORNING AND TAKE ONE TABLET BY MOUTH WITH SUPPER   glucose blood test strip E 11.9 qd Once Daily one touch   ipratropium 0.03 % nasal spray Commonly known as: ATROVENT Place 2 sprays into both nostrils 3 (three) times daily. Prn   isosorbide mononitrate 30 MG 24 hr tablet Commonly known as: IMDUR TAKE ONE TABLET BY MOUTH EVERY MORNING AND TAKE TWO TABLETS BY MOUTH EVERY EVENING   levothyroxine 200 MCG tablet Commonly known as: SYNTHROID Take 1 tablet (200 mcg total) by mouth daily. In am on empty stomach   liothyronine 5 MCG tablet Commonly known as: CYTOMEL Take 0.5 tablets (2.5 mcg total) by mouth every other day.   losartan 25 MG tablet Commonly known as: COZAAR Take 1 tablet (25 mg total) by mouth daily. Stop ramipril 20 mg qd   metFORMIN 1000 MG tablet Commonly known as: GLUCOPHAGE Take 1 tablet (1,000 mg total) by mouth daily with breakfast.   onetouch ultrasoft lancets Qd E 11.9 Once daily   pantoprazole 40 MG tablet Commonly known as: PROTONIX Take 1 tablet (40 mg total) by mouth daily.   pregabalin 50 MG capsule Commonly known as: LYRICA Take 50 mg by mouth daily at 12 noon.   sodium chloride 0.65 % Soln nasal spray Commonly known as: OCEAN Place 1 spray into both nostrils daily as needed for congestion.   TYLENOL PO Take by mouth.   Ventolin HFA 108 (90 Base) MCG/ACT inhaler Generic drug: albuterol Inhale 1-2 puffs into the lungs every 6 (six) hours as needed for wheezing or shortness of breath.   zolpidem 10 MG tablet Commonly known as: AMBIEN Take 1 tablet (10 mg total) by mouth at bedtime as needed for sleep.         Allergies:  Allergies  Allergen Reactions   Penicillins Hives, Rash, Other (See Comments) and Swelling    Has patient had a PCN reaction causing immediate rash, facial/tongue/throat swelling, SOB or lightheadedness with hypotension: Yes Has patient had a PCN reaction causing severe rash involving mucus membranes or skin necrosis: No Has patient had a PCN reaction that required hospitalization No Has patient had a PCN reaction occurring within the last 10 years: No If all of the above answers are "NO", then may proceed with Cephalosporin use.     Family History: Family History  Problem Relation Age of Onset   Myelodysplastic syndrome  Mother    Arthritis Mother    Emphysema Father    Alcohol abuse Father    COPD Father    Depression Father    Early death Maternal Grandfather    Heart disease Maternal Grandfather    Stroke Maternal Grandfather     Social History:  reports that he has never smoked. He has never used smokeless tobacco. He reports that he does not drink alcohol and does not use drugs.   Physical Exam: BP (!) 159/75   Pulse 60   Ht '5\' 10"'$  (1.778 m)   Wt 235 lb (106.6 kg)   BMI 33.72 kg/m   Constitutional:  Alert and oriented, No acute distress. HEENT: [place] AT, moist mucus membranes.  Trachea midline, no masses. Cardiovascular: No clubbing, cyanosis, or edema. Respiratory: Normal respiratory effort, no increased work of breathing. GU: Prepuce retractable, generalized erythema glans consistent with balanitis Psychiatric: Normal mood and affect.   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

## 2020-12-03 ENCOUNTER — Other Ambulatory Visit (INDEPENDENT_AMBULATORY_CARE_PROVIDER_SITE_OTHER): Payer: Medicare Other

## 2020-12-03 ENCOUNTER — Other Ambulatory Visit: Payer: Self-pay

## 2020-12-03 DIAGNOSIS — E039 Hypothyroidism, unspecified: Secondary | ICD-10-CM | POA: Diagnosis not present

## 2020-12-03 DIAGNOSIS — E1159 Type 2 diabetes mellitus with other circulatory complications: Secondary | ICD-10-CM | POA: Diagnosis not present

## 2020-12-03 DIAGNOSIS — I152 Hypertension secondary to endocrine disorders: Secondary | ICD-10-CM

## 2020-12-03 LAB — LIPID PANEL
Cholesterol: 89 mg/dL (ref 0–200)
HDL: 30.5 mg/dL — ABNORMAL LOW (ref >39.00)
LDL Cholesterol: 28 mg/dL (ref 0–99)
NonHDL: 58.52
Total CHOL/HDL Ratio: 3
Triglycerides: 152 mg/dL — ABNORMAL HIGH (ref 0.0–149.0)
VLDL: 30.4 mg/dL (ref 0.0–40.0)

## 2020-12-03 LAB — COMPREHENSIVE METABOLIC PANEL
ALT: 17 U/L (ref 0–53)
AST: 22 U/L (ref 0–37)
Albumin: 4 g/dL (ref 3.5–5.2)
Alkaline Phosphatase: 97 U/L (ref 39–117)
BUN: 13 mg/dL (ref 6–23)
CO2: 27 mEq/L (ref 19–32)
Calcium: 9.3 mg/dL (ref 8.4–10.5)
Chloride: 101 mEq/L (ref 96–112)
Creatinine, Ser: 1.1 mg/dL (ref 0.40–1.50)
GFR: 65.66 mL/min (ref >60.00)
Glucose, Bld: 101 mg/dL — ABNORMAL HIGH (ref 70–99)
Potassium: 5.2 mEq/L — ABNORMAL HIGH (ref 3.5–5.1)
Sodium: 137 mEq/L (ref 135–145)
Total Bilirubin: 1.1 mg/dL (ref 0.2–1.2)
Total Protein: 7.4 g/dL (ref 6.0–8.3)

## 2020-12-03 LAB — CBC WITH DIFFERENTIAL/PLATELET
Basophils Absolute: 0.1 10*3/uL (ref 0.0–0.1)
Basophils Relative: 0.8 % (ref 0.0–3.0)
Eosinophils Absolute: 0.1 10*3/uL (ref 0.0–0.7)
Eosinophils Relative: 1.7 % (ref 0.0–5.0)
HCT: 46.3 % (ref 39.0–52.0)
Hemoglobin: 15.1 g/dL (ref 13.0–17.0)
Lymphocytes Relative: 30.4 % (ref 12.0–46.0)
Lymphs Abs: 2.1 10*3/uL (ref 0.7–4.0)
MCHC: 32.6 g/dL (ref 30.0–36.0)
MCV: 89.9 fl (ref 78.0–100.0)
Monocytes Absolute: 0.7 10*3/uL (ref 0.1–1.0)
Monocytes Relative: 9.3 % (ref 3.0–12.0)
Neutro Abs: 4.1 10*3/uL (ref 1.4–7.7)
Neutrophils Relative %: 57.8 % (ref 43.0–77.0)
Platelets: 205 10*3/uL (ref 150.0–400.0)
RBC: 5.16 Mil/uL (ref 4.22–5.81)
RDW: 15.4 % (ref 11.5–15.5)
WBC: 7.1 10*3/uL (ref 4.0–10.5)

## 2020-12-03 LAB — HEMOGLOBIN A1C: Hgb A1c MFr Bld: 6.8 % — ABNORMAL HIGH (ref 4.6–6.5)

## 2020-12-03 LAB — TSH: TSH: 0.57 u[IU]/mL (ref 0.35–5.50)

## 2020-12-04 LAB — MICROALBUMIN / CREATININE URINE RATIO
Creatinine, Urine: 61 mg/dL (ref 20–320)
Microalb Creat Ratio: 11 mcg/mg creat (ref <30)
Microalb, Ur: 0.7 mg/dL

## 2020-12-08 ENCOUNTER — Other Ambulatory Visit: Payer: Self-pay

## 2020-12-08 DIAGNOSIS — E875 Hyperkalemia: Secondary | ICD-10-CM

## 2020-12-10 ENCOUNTER — Other Ambulatory Visit: Payer: Self-pay

## 2020-12-10 ENCOUNTER — Other Ambulatory Visit (INDEPENDENT_AMBULATORY_CARE_PROVIDER_SITE_OTHER): Payer: Medicare Other

## 2020-12-10 DIAGNOSIS — E875 Hyperkalemia: Secondary | ICD-10-CM | POA: Diagnosis not present

## 2020-12-10 DIAGNOSIS — C029 Malignant neoplasm of tongue, unspecified: Secondary | ICD-10-CM | POA: Diagnosis not present

## 2020-12-10 LAB — BASIC METABOLIC PANEL
BUN: 15 mg/dL (ref 7–25)
CO2: 27 mmol/L (ref 20–32)
Calcium: 9.1 mg/dL (ref 8.6–10.3)
Chloride: 102 mmol/L (ref 98–110)
Creat: 1.17 mg/dL (ref 0.70–1.28)
Glucose, Bld: 129 mg/dL — ABNORMAL HIGH (ref 65–99)
Potassium: 4.9 mmol/L (ref 3.5–5.3)
Sodium: 139 mmol/L (ref 135–146)

## 2020-12-31 ENCOUNTER — Ambulatory Visit (INDEPENDENT_AMBULATORY_CARE_PROVIDER_SITE_OTHER): Payer: Medicare Other

## 2020-12-31 ENCOUNTER — Other Ambulatory Visit: Payer: Self-pay

## 2020-12-31 DIAGNOSIS — G4733 Obstructive sleep apnea (adult) (pediatric): Secondary | ICD-10-CM

## 2020-12-31 NOTE — Progress Notes (Signed)
95 percentile pressure 6.4   95th percentile leak 9.2   apnea index 0.5 /hr  apnea-hypopnea index  2.4 /hr   total days used  >4 hr 90 days  total days used <4 hr 0 days  Total compliance 100 percent   He is doing great on cpap. He did state that he turned his pressure down from 10 to 6.4 his self and is doing well with that. His machine is over 5 years and will be speaking with Dr Humphrey Rolls for new machine at next visit.

## 2021-01-07 ENCOUNTER — Ambulatory Visit: Payer: Medicare Other

## 2021-01-15 ENCOUNTER — Ambulatory Visit: Payer: Medicare Other | Admitting: Internal Medicine

## 2021-01-19 DIAGNOSIS — Z23 Encounter for immunization: Secondary | ICD-10-CM | POA: Diagnosis not present

## 2021-01-21 DIAGNOSIS — C760 Malignant neoplasm of head, face and neck: Secondary | ICD-10-CM | POA: Diagnosis not present

## 2021-01-21 DIAGNOSIS — Z8589 Personal history of malignant neoplasm of other organs and systems: Secondary | ICD-10-CM | POA: Diagnosis not present

## 2021-01-21 DIAGNOSIS — C029 Malignant neoplasm of tongue, unspecified: Secondary | ICD-10-CM | POA: Diagnosis not present

## 2021-01-26 ENCOUNTER — Encounter: Payer: Self-pay | Admitting: Internal Medicine

## 2021-01-26 ENCOUNTER — Other Ambulatory Visit: Payer: Self-pay

## 2021-01-26 ENCOUNTER — Ambulatory Visit (INDEPENDENT_AMBULATORY_CARE_PROVIDER_SITE_OTHER): Payer: Medicare Other | Admitting: Internal Medicine

## 2021-01-26 VITALS — BP 140/60 | HR 63 | Temp 98.1°F | Resp 16 | Ht 70.0 in | Wt 238.4 lb

## 2021-01-26 DIAGNOSIS — I2510 Atherosclerotic heart disease of native coronary artery without angina pectoris: Secondary | ICD-10-CM

## 2021-01-26 DIAGNOSIS — G4733 Obstructive sleep apnea (adult) (pediatric): Secondary | ICD-10-CM | POA: Diagnosis not present

## 2021-01-26 DIAGNOSIS — Z85819 Personal history of malignant neoplasm of unspecified site of lip, oral cavity, and pharynx: Secondary | ICD-10-CM | POA: Diagnosis not present

## 2021-01-26 DIAGNOSIS — Z9989 Dependence on other enabling machines and devices: Secondary | ICD-10-CM

## 2021-01-26 DIAGNOSIS — K219 Gastro-esophageal reflux disease without esophagitis: Secondary | ICD-10-CM | POA: Diagnosis not present

## 2021-01-26 NOTE — Progress Notes (Signed)
[facility] [place], [place] [address]  Pulmonary Sleep Medicine   Office Visit Note  Patient Name: Tony Lawrence DOB: October 28, 1945 MRN [id number]  Date of Service: 01/26/2021  Complaints/HPI: OSA. He states he lowered the pressure because it was keeping him awake at night. Now doing better with the pressures. He had Covid in July. 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  Denies having any hemoptysis denies any chest pain no palpitations are noted.  As far as sleep is concerned he thought that the pressure was too much so we went ahead and lowered his pressure settings.  And he states that he has seems to be sleeping better now.  ROS  General: (-) fever, (-) chills, (-) night sweats, (-) weakness Skin: (-) rashes, (-) itching,. Eyes: (-) visual changes, (-) redness, (-) itching. Nose and Sinuses: (-) nasal stuffiness or itchiness, (-) postnasal drip, (-) nosebleeds, (-) sinus trouble. Mouth and Throat: (-) sore throat, (-) hoarseness. Neck: (-) swollen glands, (-) enlarged thyroid, (-) neck pain. Respiratory: - cough, (-) bloody sputum, - shortness of breath, - wheezing. Cardiovascular: - ankle swelling, (-) chest pain. Lymphatic: (-) lymph node enlargement. Neurologic: (-) numbness, (-) tingling. Psychiatric: (-) anxiety, (-) depression   Current Medication: Outpatient Encounter Medications as of 01/26/2021  Medication Sig   Acetaminophen (TYLENOL PO) Take by mouth.   aspirin 81 MG tablet Take 81 mg by mouth daily.   atorvastatin (LIPITOR) 20 MG tablet Take 1 tablet (20 mg total) by mouth daily at 6 PM. At night   bisoprolol-hydrochlorothiazide (ZIAC) 2.5-6.25 MG tablet Take 1 tablet by mouth daily.   chlorhexidine (PERIDEX) 0.12 % solution SMARTSIG:By Mouth   clopidogrel (PLAVIX) 75 MG tablet Take 1 tablet (75 mg total) by mouth daily.   empagliflozin (JARDIANCE) 25 MG TABS tablet Take 1 tablet (25 mg total) by mouth daily.    glimepiride (AMARYL) 1 MG tablet TAKE ONE TABLET BY MOUTH EVERY MORNING AND TAKE ONE TABLET BY MOUTH WITH SUPPER   glucose blood test strip E 11.9 qd Once Daily one touch   isosorbide mononitrate (IMDUR) 30 MG 24 hr tablet TAKE ONE TABLET BY MOUTH EVERY MORNING AND TAKE TWO TABLETS BY MOUTH EVERY EVENING   Lancets (ONETOUCH ULTRASOFT) lancets Qd E 11.9 Once daily   levothyroxine (SYNTHROID) 200 MCG tablet Take 1 tablet (200 mcg total) by mouth daily. In am on empty stomach   liothyronine (CYTOMEL) 5 MCG tablet Take 0.5 tablets (2.5 mcg total) by mouth every other day.   losartan (COZAAR) 25 MG tablet Take 1 tablet (25 mg total) by mouth daily. Stop ramipril 20 mg qd   metFORMIN (GLUCOPHAGE) 1000 MG tablet Take 1 tablet (1,000 mg total) by mouth daily with breakfast.   Multiple Vitamins-Minerals (CENTRUM SILVER 50+MEN PO) Take 1 tablet by mouth every morning.    pantoprazole (PROTONIX) 40 MG tablet Take 1 tablet (40 mg total) by mouth daily.   [DISCONTINUED] azithromycin (ZITHROMAX) 250 MG tablet 2 pills day 1 and 1 pill day 2-5 with food (Patient not taking: Reported on 01/26/2021)   [DISCONTINUED] benzonatate (TESSALON) 200 MG capsule Take 1 capsule (200 mg total) by mouth 3 (three) times daily as needed for cough. (Patient not taking: Reported on 01/26/2021)   [DISCONTINUED] clotrimazole-betamethasone (LOTRISONE) cream Apply to affected area 2 times daily. (Patient not taking: Reported on 01/26/2021)   [DISCONTINUED] ipratropium (ATROVENT) 0.03 % nasal spray Place 2 sprays into both nostrils  3 (three) times daily. Prn (Patient not taking: Reported on 01/26/2021)   [DISCONTINUED] pregabalin (LYRICA) 50 MG capsule Take 50 mg by mouth daily at 12 noon. (Patient not taking: Reported on 01/26/2021)   [DISCONTINUED] sodium chloride (OCEAN) 0.65 % SOLN nasal spray Place 1 spray into both nostrils daily as needed for congestion. (Patient not taking: Reported on 01/26/2021)   [DISCONTINUED] VENTOLIN HFA 108 (90  Base) MCG/ACT inhaler Inhale 1-2 puffs into the lungs every 6 (six) hours as needed for wheezing or shortness of breath. (Patient not taking: Reported on 01/26/2021)   [DISCONTINUED] zolpidem (AMBIEN) 10 MG tablet Take 1 tablet (10 mg total) by mouth at bedtime as needed for sleep.   No facility-administered encounter medications on file as of 01/26/2021.    Surgical History: Past Surgical History:  Procedure Laterality Date   BIOPSY TONGUE     11/11/15 SCC tongue    cancer of tongue  08/2020   removal of cancer   CARDIAC CATHETERIZATION N/A 11/10/2015   Procedure: Left Heart Cath and Coronary Angiography;  Surgeon: Wellington Hampshire, MD;  Location: [facility];  Service: Cardiovascular;  Laterality: N/A;   COLONOSCOPY  05/2006   COLONOSCOPY WITH PROPOFOL N/A 05/24/2017   Procedure: COLONOSCOPY WITH PROPOFOL;  Surgeon: Lollie Sails, MD;  Location: [facility] ENDOSCOPY;  Service: Endoscopy;  Laterality: N/A;   EXCISION OF TONGUE LESION N/A 11/11/2015   Procedure: EXCISION OF TONGUE LESION/ WITH FROZEN SECTION;  Surgeon: Beverly Gust, MD;  Location: [facility] ORS;  Service: ENT;  Laterality: N/A;   REPLACEMENT TOTAL KNEE Left    REPLACEMENT TOTAL KNEE Right 04/2020   [place] [facility] [place] [facility]   right tka minimally invasive 05/06/20 Dr. Armandina Gemma      Medical History: Past Medical History:  Diagnosis Date   Actinic keratoses    Allergy    Arthritis    low back pain s/p shots and blocks, knee pain   BCC (basal cell carcinoma of skin)    forehead and chest Dr. Evorn Gong q 6 months    CAD (coronary artery disease)    Complication of anesthesia    SEVERE SORE THROAT AFTER BIOPSY 2010   Coronary artery disease    Chronic total occlusion of mid LAD   COVID-19    11/17/20   Diabetes mellitus without complication (HCC)    GERD (gastroesophageal reflux disease)    History of chicken pox    History of shingles    Hyperlipidemia    Hypertension    Hypothyroidism    OSA on  CPAP    SCC (squamous cell carcinoma)    invasive left Post. lateral Tongue [facility] Dr. 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 Tami Ribas, [facility] H/o     Family History: Family History  Problem Relation Age of Onset   Myelodysplastic syndrome Mother    Arthritis Mother    Emphysema Father    Alcohol abuse Father    COPD Father    Depression Father    Early death Maternal Grandfather    Heart disease Maternal Grandfather    Stroke Maternal Grandfather     Social History: Social History   Socioeconomic History   Marital status: Married    Spouse name: Not on file   Number of children:  Not on file   Years of education: Not on file   Highest education level: Not on file  Occupational History   Not on file  Tobacco Use   Smoking status: Never   Smokeless tobacco: Never  Vaping Use   Vaping Use: Never used  Substance and Sexual Activity   Alcohol use: Never    Alcohol/week: 0.0 standard drinks   Drug use: No   Sexual activity: Yes  Other Topics Concern   Not on file  Social History Narrative   Married    3 sons    Forensic psychologist, 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    Owns guns, wears seat belt, safe in relationship   Social Determinants of Radio broadcast assistant Strain: Low Risk    Difficulty of Paying Living Expenses: Not hard at all  Food Insecurity: No Food Insecurity   Worried About Charity fundraiser in the Last Year: Never true   Arboriculturist in the Last Year: Never true  Transportation Needs: No Transportation Needs   Lack of Transportation (Medical): No   Lack of Transportation (Non-Medical): No  Physical Activity: Not on file  Stress: No Stress Concern Present   Feeling of Stress : Not at all  Social Connections: Not on file  Intimate Partner Violence: Not At Risk   Fear of  Current or Ex-Partner: No   Emotionally Abused: No   Physically Abused: No   Sexually Abused: No    Vital Signs: Blood pressure 140/60, pulse 63, temperature 98.1 F (36.7 C), resp. rate 16, height 5\' 10"  (1.778 m), weight 238 lb 6.4 oz (108.1 kg), SpO2 97 %.  Examination: General Appearance: The patient is well-developed, well-nourished, and in no distress. Skin: Gross inspection of skin unremarkable. Head: normocephalic, no gross deformities. Eyes: no gross deformities noted. ENT: ears appear grossly normal no exudates. Neck: Supple. No thyromegaly. No LAD. Respiratory: no rhonchi noted at this time. Cardiovascular: Normal S1 and S2 without murmur or rub. Extremities: No cyanosis. pulses are equal. Neurologic: Alert and oriented. No involuntary movements.  LABS: Recent Results (from the past 2160 hour(s))  Microalbumin / creatinine urine ratio     Status: None   Collection Time: 12/03/20  8:09 AM  Result Value Ref Range   Creatinine, Urine 61 20 - 320 mg/dL   Microalb, Ur 0.7 mg/dL    Comment: Reference Range Not established    Microalb Creat Ratio 11 <30 mcg/mg creat    Comment: . The ADA defines abnormalities in albumin excretion as follows: Marland Kitchen Albuminuria Category        Result (mcg/mg creatinine) . Normal to Mildly increased   <30 Moderately increased         30-299  Severely increased           > OR = 300 . The ADA recommends that at least two of three specimens collected within a 3-6 month period be abnormal before considering a patient to be within a diagnostic category.   TSH     Status: None   Collection Time: 12/03/20  8:09 AM  Result Value Ref Range   TSH 0.57 0.35 - 5.50 uIU/mL  Hemoglobin A1c     Status: Abnormal   Collection Time: 12/03/20  8:09 AM  Result Value Ref Range   Hgb A1c MFr Bld 6.8 (H) 4.6 - 6.5 %    Comment: Glycemic Control Guidelines for People with Diabetes:Non Diabetic:  <6%Goal of Therapy: <7%Additional Action Suggested:  >  8%    CBC w/Diff     Status: None   Collection Time: 12/03/20  8:09 AM  Result Value Ref Range   WBC 7.1 4.0 - 10.5 K/uL   RBC 5.16 4.22 - 5.81 Mil/uL   Hemoglobin 15.1 13.0 - 17.0 g/dL   HCT 46.3 39.0 - 52.0 %   MCV 89.9 78.0 - 100.0 fl   MCHC 32.6 30.0 - 36.0 g/dL   RDW 15.4 11.5 - 15.5 %   Platelets 205.0 150.0 - 400.0 K/uL   Neutrophils Relative % 57.8 43.0 - 77.0 %   Lymphocytes Relative 30.4 12.0 - 46.0 %   Monocytes Relative 9.3 3.0 - 12.0 %   Eosinophils Relative 1.7 0.0 - 5.0 %   Basophils Relative 0.8 0.0 - 3.0 %   Neutro Abs 4.1 1.4 - 7.7 K/uL   Lymphs Abs 2.1 0.7 - 4.0 K/uL   Monocytes Absolute 0.7 0.1 - 1.0 K/uL   Eosinophils Absolute 0.1 0.0 - 0.7 K/uL   Basophils Absolute 0.1 0.0 - 0.1 K/uL  Lipid panel     Status: Abnormal   Collection Time: 12/03/20  8:09 AM  Result Value Ref Range   Cholesterol 89 0 - 200 mg/dL    Comment: ATP III Classification       Desirable:  < 200 mg/dL               Borderline High:  200 - 239 mg/dL          High:  > = 240 mg/dL   Triglycerides 152.0 (H) 0.0 - 149.0 mg/dL    Comment: Normal:  <150 mg/dLBorderline High:  150 - 199 mg/dL   HDL 30.50 (L) >39.00 mg/dL   VLDL 30.4 0.0 - 40.0 mg/dL   LDL Cholesterol 28 0 - 99 mg/dL   Total CHOL/HDL Ratio 3     Comment:                Men          Women1/2 Average Risk     3.4          3.3Average Risk          5.0          4.42X Average Risk          9.6          7.13X Average Risk          15.0          11.0                       NonHDL 58.52     Comment: NOTE:  Non-HDL goal should be 30 mg/dL higher than patient's LDL goal (i.e. LDL goal of < 70 mg/dL, would have non-HDL goal of < 100 mg/dL)  Comprehensive metabolic panel     Status: Abnormal   Collection Time: 12/03/20  8:09 AM  Result Value Ref Range   Sodium 137 135 - 145 mEq/L   Potassium 5.2 No hemolysis seen (H) 3.5 - 5.1 mEq/L   Chloride 101 96 - 112 mEq/L   CO2 27 19 - 32 mEq/L   Glucose, Bld 101 (H) 70 - 99 mg/dL   BUN 13 6 - 23  mg/dL   Creatinine, Ser 1.10 0.40 - 1.50 mg/dL   Total Bilirubin 1.1 0.2 - 1.2 mg/dL   Alkaline Phosphatase 97 39 - 117 U/L   AST 22 0 - 37 U/L  ALT 17 0 - 53 U/L   Total Protein 7.4 6.0 - 8.3 g/dL   Albumin 4.0 3.5 - 5.2 g/dL   GFR 65.66 >60.00 mL/min    Comment: Calculated using the CKD-EPI Creatinine Equation (2021)   Calcium 9.3 8.4 - 10.5 mg/dL  Basic Metabolic Panel (BMET)     Status: Abnormal   Collection Time: 12/10/20  2:00 PM  Result Value Ref Range   Glucose, Bld 129 (H) 65 - 99 mg/dL    Comment: .            Fasting reference interval . For someone without known diabetes, a glucose value >125 mg/dL indicates that they may have diabetes and this should be confirmed with a follow-up test. .    BUN 15 7 - 25 mg/dL   Creat 1.17 0.70 - 1.28 mg/dL   BUN/Creatinine Ratio NOT APPLICABLE 6 - 22 (calc)   Sodium 139 135 - 146 mmol/L   Potassium 4.9 3.5 - 5.3 mmol/L   Chloride 102 98 - 110 mmol/L   CO2 27 20 - 32 mmol/L   Calcium 9.1 8.6 - 10.3 mg/dL    Radiology: US Venous Img Lower Unilateral Right  Result Date: 06/10/2020 CLINICAL DATA:  75 year old male with right lower extremity swelling after total knee replacement on 05/06/2020. EXAM: RIGHT LOWER EXTREMITY VENOUS DOPPLER ULTRASOUND TECHNIQUE: Gray-scale sonography with graded compression, as well as color Doppler and duplex ultrasound were performed to evaluate the right lower extremity deep venous systems from the level of the common femoral vein and including the common femoral, femoral, profunda femoral, popliteal and calf veins including the posterior tibial, peroneal and gastrocnemius veins when visible. Spectral Doppler was utilized to evaluate flow at rest and with distal augmentation maneuvers in the common femoral, femoral and popliteal veins. The contralateral common femoral vein was also evaluated for comparison. COMPARISON:  11/06/2019 FINDINGS: RIGHT LOWER EXTREMITY Common Femoral Vein: No evidence of  thrombus. Normal compressibility, respiratory phasicity and response to augmentation. Central Greater Saphenous Vein: No evidence of thrombus. Normal compressibility and flow on color Doppler imaging. Central Profunda Femoral Vein: No evidence of thrombus. Normal compressibility and flow on color Doppler imaging. Femoral Vein: No evidence of thrombus. Normal compressibility, respiratory phasicity and response to augmentation. Popliteal Vein: No evidence of thrombus. Normal compressibility, respiratory phasicity and response to augmentation. Calf Veins: No evidence of thrombus. Normal compressibility and flow on color Doppler imaging. Other Findings:  None. LEFT LOWER EXTREMITY Common Femoral Vein: No evidence of thrombus. Normal compressibility, respiratory phasicity and response to augmentation. IMPRESSION: No evidence of right lower extremity deep venous thrombosis. Ruthann Cancer, MD [facility] Rad[facility] Electronically Signed   By: Ruthann Cancer MD   On: 06/10/2020 14:40    No results found.  No results found.    Assessment and Plan: Patient Active Problem List   Diagnosis Date Noted   Hypertension associated with diabetes ([place]) 11/18/2020   SCC (squamous cell carcinoma)    Tongue cancer ([place])    Mitral valve insufficiency 12/22/2019   Morbid obesity ([place]) 12/22/2019   Obesity (BMI 30-39.9) 11/15/2019   H/O total knee replacement, 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   Arthritis of right knee 11/15/2019   Benign prostatic hyperplasia with nocturia 11/15/2019   Squamous cell carcinoma in situ    Actinic keratoses    Erectile dysfunction due to arterial insufficiency 04/24/2019   Primary osteoarthritis of left knee 11/22/2018   Gastroesophageal reflux disease without esophagitis 03/21/2018   History  of skin cancer 03/21/2018   History of tongue cancer 03/21/2018   OSA on CPAP 03/21/2018   Diarrhea 03/21/2018   Hypothyroidism 03/21/2018   Chronic low back  pain 03/14/2018   Coronary artery disease of native artery of native heart with stable angina pectoris ([place]) 12/22/2017   Essential hypertension 12/22/2017   Hyperlipidemia LDL goal <70 12/22/2017   Central obesity 03/01/2016   Cancer of ventral surface of tongue ([place]) 11/04/2015   Malignant neoplasm of tongue, tip and lateral border ([place]) 11/04/2015   Primary osteoarthritis of both knees 05/13/2014   Other intervertebral disc displacement, lumbar region 01/16/2014   Neuritis or radiculitis due to rupture of lumbar intervertebral disc 01/16/2014   Parapelvic renal cyst 12/18/2013   Flank pain 11/30/2013   Arthritis, degenerative 03/12/2013   Diabetes mellitus ([place]) 03/12/2013   Increased frequency of urination 03/12/2013   Slowing of urinary stream 03/12/2013   Urinary hesitancy 03/12/2013   Benign prostatic hyperplasia with urinary obstruction 03/06/2012    1. OSA on CPAP Current machine is over 19 years old at end of life. Will plan on trying to get a new device through his DME provider.  Hopefully will be able to do without a repeat sleep study but will see what the insurance company states  2. Gastroesophageal reflux disease without esophagitis Controlled we will continue with supportive care  3. History of oral cancer Being followed at [place]: I have discussed the findings of the evaluation and examination with Aidynn.  I have also discussed any further diagnostic evaluation thatmay be needed or ordered today. Jazmine verbalizes understanding of the findings of todays visit. We also reviewed his medications today and discussed drug interactions and side effects including but not limited excessive drowsiness and altered mental states. We also discussed that there is always a risk not just to him but also people around him. he has been encouraged to call the office with any questions or concerns that should arise related to todays visit.  Orders Placed This Encounter   Procedures   For home use only DME continuous positive airway pressure (CPAP)    Order Specific Question:   Length of Need    Answer:   Lifetime    Order Specific Question:   Patient has OSA or probable OSA    Answer:   Yes    Order Specific Question:   Is the patient currently using CPAP in the home    Answer:   Yes    Order Specific Question:   Settings    Answer:   5-10    Order Specific Question:   CPAP supplies needed    Answer:   Mask, headgear, cushions, filters, heated tubing and water chamber     Time spent: 35  I have personally obtained a history, examined the patient, evaluated laboratory and imaging results, formulated the assessment and plan and placed orders.    Allyne Gee, MD [facility] Pulmonary and Critical Care Sleep medicine

## 2021-01-26 NOTE — Patient Instructions (Signed)

## 2021-02-11 DIAGNOSIS — C029 Malignant neoplasm of tongue, unspecified: Secondary | ICD-10-CM | POA: Diagnosis not present

## 2021-02-13 DIAGNOSIS — E119 Type 2 diabetes mellitus without complications: Secondary | ICD-10-CM | POA: Diagnosis not present

## 2021-02-13 LAB — HM DIABETES EYE EXAM

## 2021-02-17 LAB — HM DIABETES EYE EXAM

## 2021-02-20 ENCOUNTER — Ambulatory Visit (INDEPENDENT_AMBULATORY_CARE_PROVIDER_SITE_OTHER): Payer: Medicare Other

## 2021-02-20 VITALS — Ht 70.0 in | Wt 230.0 lb

## 2021-02-20 DIAGNOSIS — Z0000 Encounter for general adult medical examination without abnormal findings: Secondary | ICD-10-CM

## 2021-02-20 NOTE — Patient Instructions (Addendum)
Tony Lawrence , Thank you for taking time to come for your Medicare Wellness Visit. I appreciate your ongoing commitment to your health goals. Please review the following plan we discussed and let me know if I can assist you in the future.   These are the goals we discussed:  Goals      Follow up with Primary Care Provider     As needed        This is a list of the screening recommended for you and due dates:  Health Maintenance  Topic Date Due   Complete foot exam   02/28/2020   Pneumonia Vaccine (2 - PCV) 02/29/2020   Eye exam for diabetics  01/27/2021   COVID-19 Vaccine (4 - Booster for Pfizer series) 03/08/2021*   Hemoglobin A1C  06/05/2021   Colon Cancer Screening  05/25/2027   Tetanus Vaccine  08/01/2028   Flu Shot  Completed   Hepatitis C Screening: USPSTF Recommendation to screen - Ages 18-79 yo.  Completed   Zoster (Shingles) Vaccine  Completed   HPV Vaccine  Aged Out  *Topic was postponed. The date shown is not the original due date.   Advanced directives: on file.  Conditions/risks identified: possible foot referral. Notes color of purple starting to appear under both big toes. Onset a couple of weeks ago. Denies pain, open wounds/sores. Follow up appointment scheduled with pcp.   Next appointment: Follow up in one year for your annual wellness visit.   Preventive Care 13 Years and Older, Male Preventive care refers to lifestyle choices and visits with your health care provider that can promote health and wellness. What does preventive care include? A yearly physical exam. This is also called an annual well check. Dental exams once or twice a year. Routine eye exams. Ask your health care provider how often you should have your eyes checked. Personal lifestyle choices, including: Daily care of your teeth and gums. Regular physical activity. Eating a healthy diet. Avoiding tobacco and drug use. Limiting alcohol use. Practicing safe sex. Taking low doses of  aspirin every day. Taking vitamin and mineral supplements as recommended by your health care provider. What happens during an annual well check? The services and screenings done by your health care provider during your annual well check will depend on your age, overall health, lifestyle risk factors, and family history of disease. Counseling  Your health care provider may ask you questions about your: Alcohol use. Tobacco use. Drug use. Emotional well-being. Home and relationship well-being. Sexual activity. Eating habits. History of falls. Memory and ability to understand (cognition). Work and work Statistician. Screening  You may have the following tests or measurements: Height, weight, and BMI. Blood pressure. Lipid and cholesterol levels. These may be checked every 5 years, or more frequently if you are over 43 years old. Skin check. Lung cancer screening. You may have this screening every year starting at age 82 if you have a 30-pack-year history of smoking and currently smoke or have quit within the past 15 years. Fecal occult blood test (FOBT) of the stool. You may have this test every year starting at age 62. Flexible sigmoidoscopy or colonoscopy. You may have a sigmoidoscopy every 5 years or a colonoscopy every 10 years starting at age 54. Prostate cancer screening. Recommendations will vary depending on your family history and other risks. Hepatitis C blood test. Hepatitis B blood test. Sexually transmitted disease (STD) testing. Diabetes screening. This is done by checking your blood sugar (glucose) after you  have not eaten for a while (fasting). You may have this done every 1-3 years. Abdominal aortic aneurysm (AAA) screening. You may need this if you are a current or former smoker. Osteoporosis. You may be screened starting at age 74 if you are at high risk. Talk with your health care provider about your test results, treatment options, and if necessary, the need for more  tests. Vaccines  Your health care provider may recommend certain vaccines, such as: Influenza vaccine. This is recommended every year. Tetanus, diphtheria, and acellular pertussis (Tdap, Td) vaccine. You may need a Td booster every 10 years. Zoster vaccine. You may need this after age 11. Pneumococcal 13-valent conjugate (PCV13) vaccine. One dose is recommended after age 71. Pneumococcal polysaccharide (PPSV23) vaccine. One dose is recommended after age 51. Talk to your health care provider about which screenings and vaccines you need and how often you need them. This information is not intended to replace advice given to you by your health care provider. Make sure you discuss any questions you have with your health care provider. Document Released: 05/09/2015 Document Revised: 12/31/2015 Document Reviewed: 02/11/2015 Elsevier Interactive Patient Education  2017 [place] Prevention in the Home Falls can cause injuries. They can happen to people of all ages. There are many things you can do to make your home safe and to help prevent falls. What can I do on the outside of my home? Regularly fix the edges of walkways and driveways and fix any cracks. Remove anything that might make you trip as you walk through a door, such as a raised step or threshold. Trim any bushes or trees on the path to your home. Use bright outdoor lighting. Clear any walking paths of anything that might make someone trip, such as rocks or tools. Regularly check to see if handrails are loose or broken. Make sure that both sides of any steps have handrails. Any raised decks and porches should have guardrails on the edges. Have any leaves, snow, or ice cleared regularly. Use sand or salt on walking paths during winter. Clean up any spills in your garage right away. This includes oil or grease spills. What can I do in the bathroom? Use night lights. Install grab bars by the toilet and in the tub and shower.  Do not use towel bars as grab bars. Use non-skid mats or decals in the tub or shower. If you need to sit down in the shower, use a plastic, non-slip stool. Keep the floor dry. Clean up any water that spills on the floor as soon as it happens. Remove soap buildup in the tub or shower regularly. Attach bath mats securely with double-sided non-slip rug tape. Do not have throw rugs and other things on the floor that can make you trip. What can I do in the bedroom? Use night lights. Make sure that you have a light by your bed that is easy to reach. Do not use any sheets or blankets that are too big for your bed. They should not hang down onto the floor. Have a firm chair that has side arms. You can use this for support while you get dressed. Do not have throw rugs and other things on the floor that can make you trip. What can I do in the kitchen? Clean up any spills right away. Avoid walking on wet floors. Keep items that you use a lot in easy-to-reach places. If you need to reach something above you, use a strong step  stool that has a grab bar. Keep electrical cords out of the way. Do not use floor polish or wax that makes floors slippery. If you must use wax, use non-skid floor wax. Do not have throw rugs and other things on the floor that can make you trip. What can I do with my stairs? Do not leave any items on the stairs. Make sure that there are handrails on both sides of the stairs and use them. Fix handrails that are broken or loose. Make sure that handrails are as long as the stairways. Check any carpeting to make sure that it is firmly attached to the stairs. Fix any carpet that is loose or worn. Avoid having throw rugs at the top or bottom of the stairs. If you do have throw rugs, attach them to the floor with carpet tape. Make sure that you have a light switch at the top of the stairs and the bottom of the stairs. If you do not have them, ask someone to add them for you. What else  can I do to help prevent falls? Wear shoes that: Do not have high heels. Have rubber bottoms. Are comfortable and fit you well. Are closed at the toe. Do not wear sandals. If you use a stepladder: Make sure that it is fully opened. Do not climb a closed stepladder. Make sure that both sides of the stepladder are locked into place. Ask someone to hold it for you, if possible. Clearly mark and make sure that you can see: Any grab bars or handrails. First and last steps. Where the edge of each step is. Use tools that help you move around (mobility aids) if they are needed. These include: Canes. Walkers. Scooters. Crutches. Turn on the lights when you go into a dark area. Replace any light bulbs as soon as they burn out. Set up your furniture so you have a clear path. Avoid moving your furniture around. If any of your floors are uneven, fix them. If there are any pets around you, be aware of where they are. Review your medicines with your doctor. Some medicines can make you feel dizzy. This can increase your chance of falling. Ask your doctor what other things that you can do to help prevent falls. This information is not intended to replace advice given to you by your health care provider. Make sure you discuss any questions you have with your health care provider. Document Released: 02/06/2009 Document Revised: 09/18/2015 Document Reviewed: 05/17/2014 Elsevier Interactive Patient Education  2017 [employer].

## 2021-02-20 NOTE — Progress Notes (Signed)
Subjective:   Tony Lawrence is a 75 y.o. male who presents for Medicare Annual/Subsequent preventive examination.  Review of Systems    No ROS.  Medicare Wellness Virtual Visit.  Visual/audio telehealth visit, UTA vital signs.   See social history for additional risk factors.   Cardiac Risk Factors include: advanced age (>51men, >68 women);male gender;hypertension;diabetes mellitus     Objective:    Today's Vitals   02/20/21 1326  Weight: 230 lb (104.3 kg)  Height: 5\' 10"  (1.778 m)   Body mass index is 33 kg/m.  Advanced Directives 02/20/2021 02/20/2020 07/04/2019 01/29/2019 05/24/2017 11/21/2015 11/07/2015  Does Patient Have a Medical Advance Directive? Yes Yes No Yes Yes Yes Yes  Type of Paramedic of [place];Living will [place];Living will - [place];Living will [place];Living will Living will;Healthcare Power of [place];Living will  Does patient want to make changes to medical advance directive? - No - Patient declined - No - Patient declined - - -  Copy of [place] in Chart? Yes - validated most recent copy scanned in chart (See row information) Yes - validated most recent copy scanned in chart (See row information) - Yes - validated most recent copy scanned in chart (See row information) Yes - No - copy requested    Current Medications (verified) Outpatient Encounter Medications as of 02/20/2021  Medication Sig   Acetaminophen (TYLENOL PO) Take by mouth.   aspirin 81 MG tablet Take 81 mg by mouth daily.   atorvastatin (LIPITOR) 20 MG tablet Take 1 tablet (20 mg total) by mouth daily at 6 PM. At night   bisoprolol-hydrochlorothiazide (ZIAC) 2.5-6.25 MG tablet Take 1 tablet by mouth daily.   chlorhexidine (PERIDEX) 0.12 % solution SMARTSIG:By Mouth   clopidogrel (PLAVIX) 75 MG tablet Take 1 tablet (75 mg total) by mouth daily.    empagliflozin (JARDIANCE) 25 MG TABS tablet Take 1 tablet (25 mg total) by mouth daily.   glimepiride (AMARYL) 1 MG tablet TAKE ONE TABLET BY MOUTH EVERY MORNING AND TAKE ONE TABLET BY MOUTH WITH SUPPER   glucose blood test strip E 11.9 qd Once Daily one touch   isosorbide mononitrate (IMDUR) 30 MG 24 hr tablet TAKE ONE TABLET BY MOUTH EVERY MORNING AND TAKE TWO TABLETS BY MOUTH EVERY EVENING   Lancets (ONETOUCH ULTRASOFT) lancets Qd E 11.9 Once daily   levothyroxine (SYNTHROID) 200 MCG tablet Take 1 tablet (200 mcg total) by mouth daily. In am on empty stomach   liothyronine (CYTOMEL) 5 MCG tablet Take 0.5 tablets (2.5 mcg total) by mouth every other day.   losartan (COZAAR) 25 MG tablet Take 1 tablet (25 mg total) by mouth daily. Stop ramipril 20 mg qd   metFORMIN (GLUCOPHAGE) 1000 MG tablet Take 1 tablet (1,000 mg total) by mouth daily with breakfast.   Multiple Vitamins-Minerals (CENTRUM SILVER 50+MEN PO) Take 1 tablet by mouth every morning.    pantoprazole (PROTONIX) 40 MG tablet Take 1 tablet (40 mg total) by mouth daily.   No facility-administered encounter medications on file as of 02/20/2021.    Allergies (verified) Penicillins   History: Past Medical History:  Diagnosis Date   Actinic keratoses    Allergy    Arthritis    low back pain s/p shots and blocks, knee pain   BCC (basal cell carcinoma of skin)    forehead and chest Dr. 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 Tami Ribas, [facility] H/o    Past Surgical History:  Procedure Laterality Date   BIOPSY TONGUE     11/11/15 SCC tongue    cancer of tongue  08/2020   removal of cancer   CARDIAC CATHETERIZATION N/A 11/10/2015   Procedure: Left Heart Cath and Coronary Angiography;  Surgeon: Wellington Hampshire, MD;  Location: [facility];  Service: Cardiovascular;  Laterality: N/A;   COLONOSCOPY  05/2006   COLONOSCOPY WITH PROPOFOL N/A 05/24/2017   Procedure: COLONOSCOPY WITH PROPOFOL;  Surgeon: Lollie Sails, MD;  Location: [facility];  Service: Endoscopy;  Laterality: N/A;   EXCISION OF TONGUE LESION N/A 11/11/2015   Procedure: EXCISION OF TONGUE LESION/ WITH FROZEN SECTION;  Surgeon: Beverly Gust, MD;  Location: [facility] ORS;  Service: ENT;  Laterality: N/A;   REPLACEMENT TOTAL KNEE Left    REPLACEMENT TOTAL KNEE Right 04/2020   [place] [facility]   right tka minimally invasive 05/06/20 Dr. Armandina Gemma     Family History  Problem Relation Age of Onset   Myelodysplastic syndrome Mother    Arthritis Mother    Emphysema Father    Alcohol abuse Father    COPD Father    Depression Father    Early death Maternal Grandfather    Heart disease Maternal Grandfather    Stroke Maternal Grandfather    Social History   Socioeconomic History   Marital status: Married    Spouse name: Not on file   Number of children: Not on file   Years of education: Not on file   Highest education level: Not on file  Occupational History   Not on file  Tobacco Use   Smoking status: Never   Smokeless tobacco: Never  Vaping Use   Vaping Use: Never used  Substance and Sexual Activity   Alcohol use: Never    Alcohol/week: 0.0 standard drinks   Drug use: No   Sexual activity: Yes  Other Topics Concern   Not on file  Social History Narrative    Married    3 sons    Forensic psychologist, 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    Owns guns, wears seat belt, safe in relationship   Social Determinants of Radio broadcast assistant Strain: Low Risk    Difficulty of Paying Living Expenses: Not hard at all  Food Insecurity: No Food Insecurity   Worried About Charity fundraiser in the Last Year: Never true   Arboriculturist in the Last Year: Never true  Transportation Needs: No Transportation Needs   Lack of Transportation (Medical): No   Lack of Transportation (Non-Medical): No  Physical  Activity: Not on file  Stress: No Stress Concern Present   Feeling of Stress : Not at all  Social Connections: Unknown   Frequency of Communication with Friends and Family: More than three times a week   Frequency of Social Gatherings with Friends and Family: Not on file   Attends Religious Services: More than 4 times per year   Active Member of [employer] or Organizations: Yes   Attends Archivist Meetings: Not on file   Marital Status: Not on file    Tobacco Counseling Counseling given: Not Answered   Clinical Intake:  Pre-visit preparation completed: Yes        Diabetes: Yes (Followed by pcp) Nutrition Risk Assessment: Has the patient had any N/V/D within the last 2 months?  No  Does the patient have any non-healing wounds?  No  Has the patient had any unintentional weight loss or weight gain?  No   Diabetes: If diabetic, was a CBG obtained today?  No  Did the patient bring in their glucometer from home?  No  How often do you monitor your CBG's? Every other day.   Financial Strains and Diabetes Management: Are you having any financial strains with the device, your supplies or your medication? No .  Does the patient want to be seen by Chronic Care Management for management of their diabetes?  No  Would the patient like to be referred to a Nutritionist or for Diabetic Management?  No   Interpreter Needed?: No     Activities of Daily Living In your present state of health, do you have any difficulty performing the following activities: 02/20/2021  Hearing? N  Vision? N  Difficulty concentrating or making decisions? N  Walking or climbing stairs? N  Dressing or bathing? N  Doing errands, shopping? N  Preparing Food and eating ? N  Using the Toilet? N  In the past six months, have you accidently leaked urine? N  Do you have problems with loss of bowel control? N  Managing your Medications? N  Managing your Finances? N  Housekeeping or managing your Housekeeping? N  Some recent data might be hidden    Patient Care Team: McLean-Scocuzza, Nino Glow, MD as PCP - General (Internal Medicine) End, Harrell Gave, MD as PCP - Cardiology (Cardiology)  Indicate any recent Medical Services you may have received from other than [facility] providers in the past year (date may be approximate).     Assessment:   This is a routine wellness examination for Brayden.  Virtual Visit via Telephone Note I connected with  Tony Lawrence on 02/20/21 at  1:15 PM EDT by telephone and verified that I am speaking with the correct person using two identifiers.  Location: Patient: home Provider: office Persons participating in the virtual visit: patient/Nurse Health Advisor   I discussed the limitations, risks, security and privacy concerns of performing an evaluation and management service by telephone and the availability of in person appointments. The patient expressed understanding and agreed to proceed.  Interactive audio and video telecommunications were attempted between this nurse and patient, however failed, due to patient having technical difficulties OR patient did not have access to video capability.  We continued and completed visit with audio only.  Some vital signs may be absent or patient reported.   OBrien-Blaney, Candas Deemer L, LPN   Hearing/Vision screen Hearing Screening - Comments:: Patient is able to hear  conversational tones without difficulty. No issues reported. Vision Screening - Comments:: Wears corrective lenses when  reading.  They have seen their ophthalmologist in the last 12 months. No retinopathy reported.   Dietary issues and exercise activities discussed: Current Exercise Habits: Home exercise routine, Type of exercise: walking, Intensity: Mild Low carb diet Good water intake   Goals Addressed             This Visit's Progress    Follow up with Primary Care Provider       As needed       Depression Screen PHQ 2/9 Scores 02/20/2021 02/20/2020 11/14/2019 07/18/2019 01/29/2019 03/14/2018 01/11/2018  PHQ - 2 Score 0 0 0 0 0 0 0    Fall Risk Fall Risk  02/20/2021 07/22/2020 05/20/2020 02/20/2020 11/14/2019  Falls in the past year? 0 0 0 0 0  Comment - - - - -  Number falls in past yr: 0 0 0 0 0  Injury with Fall? 0 0 0 - 0  Follow up Falls evaluation completed Falls evaluation completed Falls evaluation completed Falls evaluation completed Falls evaluation completed    [place]: Adequate lighting in your home to reduce risk of falls? Yes   ASSISTIVE DEVICES UTILIZED TO PREVENT FALLS: Life alert? No  Use of a cane, walker or w/c? No  Grab bars in the bathroom? No  Shower chair or bench in shower? No  Elevated toilet seat? Yes   TIMED UP AND GO: Was the test performed? No .   Cognitive Function: Patient is alert and oriented x3.  MMSE - Mini Mental State Exam 01/11/2018  Orientation to time 5  Orientation to Place 5  Registration 3  Attention/ Calculation 5  Recall 3  Language- name 2 objects 2  Language- repeat 1  Language- follow 3 step command 3  Language- read & follow direction 1  Write a sentence 1  Copy design 1  Total score 30     6CIT Screen 02/20/2021 01/29/2019  What Year? 0 points 0 points  What month? 0 points 0 points  What time? 0 points 0 points  Count back from 20 0 points 0 points  Months in reverse  0 points 0 points  Repeat phrase 0 points 0 points  Total Score 0 0    Immunizations Immunization History  Administered Date(s) Administered   Influenza Inj Mdck Quad Pf 01/11/2018   Influenza, High Dose Seasonal PF 03/25/2020, 01/19/2021   Influenza,inj,quad, With Preservative 01/11/2018   Influenza-Unspecified 02/10/2016, 01/25/2019, 02/23/2019   PFIZER(Purple Top)SARS-COV-2 Vaccination 06/09/2019, 07/03/2019, 03/25/2020   Pneumococcal Polysaccharide-23 03/01/2019   Tdap 08/02/2018   Zoster Recombinat (Shingrix) 12/01/2018, 02/07/2019   Pneumococcal vaccine status: Due, Education has been provided regarding the importance of this vaccine. Advised may receive this vaccine at local pharmacy or Health Dept. Aware to provide a copy of the vaccination record if obtained from local pharmacy or Health Dept. Verbalized acceptance and understanding. Deferred.   Screening Tests Health Maintenance  Topic Date Due   FOOT EXAM  02/28/2020   Pneumonia Vaccine 56+ Years old (2 - PCV) 02/29/2020   OPHTHALMOLOGY EXAM  01/27/2021   COVID-19 Vaccine (4 - Booster for Pfizer series) 03/08/2021 (Originally 05/20/2020)   HEMOGLOBIN A1C  06/05/2021   COLONOSCOPY (Pts 45-25yrs Insurance coverage will need to be confirmed)  05/25/2027   TETANUS/TDAP  08/01/2028   INFLUENZA VACCINE  Completed   Hepatitis C Screening  Completed   Zoster Vaccines- Shingrix  Completed   HPV VACCINES  Aged Out   Health Maintenance Health Maintenance Due  Topic Date Due   FOOT EXAM  02/28/2020   Pneumonia Vaccine 9+ Years old (2 - PCV) 02/29/2020   OPHTHALMOLOGY EXAM  01/27/2021   Foot exam- due. Followed by pcp. Appointment scheduled.  Lung Cancer Screening: (Low Dose CT Chest recommended if Age 30-80 years, 30 pack-year currently smoking OR have quit w/in 15years.) does not qualify.   Vision Screening: Recommended annual ophthalmology exams for early detection of glaucoma and other disorders of the eye.  Dental  Screening: Recommended annual dental exams for proper oral hygiene  Community Resource Referral / Chronic Care Management: CRR required this visit?  No   CCM required this visit?  No      Plan:   Keep all routine maintenance appointments.   I have personally reviewed and noted the following in the patient's chart:   Medical and social history Use of alcohol, tobacco or illicit drugs  Current medications and supplements including opioid prescriptions. Patient is not currently taking opioid prescriptions. Functional ability and status Nutritional status Physical activity Advanced directives List of other physicians Hospitalizations, surgeries, and ER visits in previous 12 months Vitals Screenings to include cognitive, depression, and falls Referrals and appointments  In addition, I have reviewed and discussed with patient certain preventive protocols, quality metrics, and best practice recommendations. A written personalized care plan for preventive services as well as general preventive health recommendations were provided to patient.     Varney Biles, LPN   [id number]

## 2021-02-23 ENCOUNTER — Encounter: Payer: Self-pay | Admitting: Internal Medicine

## 2021-02-24 ENCOUNTER — Encounter: Payer: Self-pay | Admitting: Internal Medicine

## 2021-02-25 ENCOUNTER — Ambulatory Visit (INDEPENDENT_AMBULATORY_CARE_PROVIDER_SITE_OTHER): Payer: Medicare Other | Admitting: Internal Medicine

## 2021-02-25 ENCOUNTER — Other Ambulatory Visit: Payer: Self-pay

## 2021-02-25 ENCOUNTER — Encounter: Payer: Self-pay | Admitting: Internal Medicine

## 2021-02-25 VITALS — BP 124/70 | HR 55 | Temp 97.5°F | Ht 70.0 in | Wt 243.2 lb

## 2021-02-25 DIAGNOSIS — J432 Centrilobular emphysema: Secondary | ICD-10-CM | POA: Diagnosis not present

## 2021-02-25 DIAGNOSIS — B351 Tinea unguium: Secondary | ICD-10-CM | POA: Diagnosis not present

## 2021-02-25 DIAGNOSIS — R911 Solitary pulmonary nodule: Secondary | ICD-10-CM

## 2021-02-25 DIAGNOSIS — S90229S Contusion of unspecified lesser toe(s) with damage to nail, sequela: Secondary | ICD-10-CM

## 2021-02-25 DIAGNOSIS — I152 Hypertension secondary to endocrine disorders: Secondary | ICD-10-CM | POA: Diagnosis not present

## 2021-02-25 DIAGNOSIS — Z0000 Encounter for general adult medical examination without abnormal findings: Secondary | ICD-10-CM

## 2021-02-25 DIAGNOSIS — E119 Type 2 diabetes mellitus without complications: Secondary | ICD-10-CM | POA: Diagnosis not present

## 2021-02-25 DIAGNOSIS — E1159 Type 2 diabetes mellitus with other circulatory complications: Secondary | ICD-10-CM

## 2021-02-25 DIAGNOSIS — E039 Hypothyroidism, unspecified: Secondary | ICD-10-CM

## 2021-02-25 MED ORDER — GLIMEPIRIDE 1 MG PO TABS: 1.0000 mg | ORAL_TABLET | Freq: Every day | ORAL | 3 refills | Status: DC

## 2021-02-25 NOTE — Patient Instructions (Addendum)
Glucose tablets Dr. Daylene Katayama [facility]  [id number] [phone] Not available [place] [address]   Results for CAMARION, WEIER (MRN [id number]) as of 02/25/2021 15:10  Ref. Range 12/03/2020 08:09  Hemoglobin A1C Latest Ref Range: 4.6 - 6.5 % 6.8 (H)  Results for KRISHAN, MCBREEN (MRN [id number]) as of 02/25/2021 15:10  Ref. Range 12/10/2020 14:00  Sodium Latest Ref Range: 135 - 146 mmol/L 139  Potassium Latest Ref Range: 3.5 - 5.3 mmol/L 4.9  Chloride Latest Ref Range: 98 - 110 mmol/L 102  CO2 Latest Ref Range: 20 - 32 mmol/L 27  Glucose Latest Ref Range: 65 - 99 mg/dL 129 (H)  BUN Latest Ref Range: 7 - 25 mg/dL 15  Creatinine Latest Ref Range: 0.70 - 1.28 mg/dL 1.17  Calcium Latest Ref Range: 8.6 - 10.3 mg/dL 9.1  BUN/Creatinine Ratio Latest Ref Range: 6 - 22 (calc) NOT APPLICABLE   Results for SHOTA, KOHRS (MRN [id number]) as of 02/25/2021 15:10  Ref. Range 12/03/2020 08:09  WBC Latest Ref Range: 4.0 - 10.5 K/uL 7.1  RBC Latest Ref Range: 4.22 - 5.81 Mil/uL 5.16  Hemoglobin Latest Ref Range: 13.0 - 17.0 g/dL 15.1  HCT Latest Ref Range: 39.0 - 52.0 % 46.3  MCV Latest Ref Range: 78.0 - 100.0 fl 89.9  MCHC Latest Ref Range: 30.0 - 36.0 g/dL 32.6  RDW Latest Ref Range: 11.5 - 15.5 % 15.4  Platelets Latest Ref Range: 150.0 - 400.0 K/uL 205.0   Hypoglycemia Hypoglycemia occurs when the level of sugar (glucose) in the blood is too low. Hypoglycemia can happen in people who have or do not have diabetes. It can develop quickly, and it can be a medical emergency. For most people, a blood glucose level below 70 mg/dL (3.9 mmol/L) is considered hypoglycemia. Glucose is a type of sugar that provides the body's main source of energy. Certain hormones (insulin and glucagon) control the level of glucose in the blood. Insulin lowers blood glucose, and glucagon raises blood glucose. Hypoglycemia can result from having too much insulin in the bloodstream, or from not  eating enough food that contains glucose. You may also have reactive hypoglycemia, which happens within 4 hours after eating a meal. What are the causes? Hypoglycemia occurs most often in people who have diabetes and may be caused by: Diabetes medicine. Not eating enough, or not eating often enough. Increased physical activity. Drinking alcohol on an empty stomach. If you do not have diabetes, hypoglycemia may be caused by: A tumor in the pancreas. Not eating enough, or not eating for long periods at a time (fasting). A severe infection or illness. Problems after having bariatric surgery. Organ failure, such as kidney or liver failure. Certain medicines. What increases the risk? Hypoglycemia is more likely to develop in people who: Have diabetes and take medicines to lower blood glucose. Abuse alcohol. Have a severe illness. What are the signs or symptoms? Symptoms vary depending on whether the condition is mild, moderate, or severe. Mild hypoglycemia Hunger. Sweating and feeling clammy. Dizziness or feeling light-headed. Sleepiness or restless sleep. Nausea. Increased heart rate. Headache. Blurry vision. Mood changes, such as irritability or anxiety. Tingling or numbness around the mouth, lips, or tongue. Moderate hypoglycemia Confusion and poor judgment. Behavior changes. Weakness. Irregular heartbeat. A change in coordination. Severe hypoglycemia Severe hypoglycemia is a medical emergency. It can cause: Fainting. Seizures. Loss of consciousness (coma). Death. How is this diagnosed? Hypoglycemia is diagnosed with a blood test  to measure your blood glucose level. This blood test is done while you are having symptoms. Your health care provider may also do a physical exam and review your medical history. How is this treated? This condition can be treated by immediately eating or drinking something that contains sugar with 15 grams of fast-acting carbohydrate, such as: 4  oz (120 mL) of fruit juice. 4 oz (120 mL) of regular soda (not diet soda). Several pieces of hard candy. Check food labels to find out how many pieces to eat for 15 grams. 1 Tbsp (15 mL) of sugar or honey. 4 glucose tablets. 1 tube of glucose gel. Treating hypoglycemia if you have diabetes If you are alert and able to swallow safely, follow the 15:15 rule: Take 15 grams of a fast-acting carbohydrate. Talk with your health care provider about how much you should take. Options for getting 15 grams of fast-acting carbohydrate include: Glucose tablets (take 4 tablets). Several pieces of hard candy. Check food labels to find out how many pieces to eat for 15 grams. 4 oz (120 mL) of fruit juice. 4 oz (120 mL) of regular soda (not diet soda). 1 Tbsp (15 mL) of sugar or honey. 1 tube of glucose gel. Check your blood glucose 15 minutes after you take the carbohydrate. If the repeat blood glucose level is still at or below 70 mg/dL (3.9 mmol/L), take 15 grams of a carbohydrate again. If your blood glucose level does not increase above 70 mg/dL (3.9 mmol/L) after 3 tries, seek emergency medical care. After your blood glucose level returns to normal, eat a meal or a snack within 1 hour.  Treating severe hypoglycemia Severe hypoglycemia is when your blood glucose level is below 54 mg/dL (3 mmol/L). Severe hypoglycemia is a medical emergency. Get medical help right away. If you have severe hypoglycemia and you cannot eat or drink, you will need to be given glucagon. A family member or close friend should learn how to check your blood glucose and how to give you glucagon. Ask your health care provider if you need to have an emergency glucagon kit available. Severe hypoglycemia may need to be treated in a hospital. The treatment may include getting glucose through an IV. You may also need treatment for the cause of your hypoglycemia. Follow these instructions at home: General instructions Take  over-the-counter and prescription medicines only as told by your health care provider. Monitor your blood glucose as told by your health care provider. If you drink alcohol: Limit how much you have to: 0-1 drink a day for women who are not pregnant. 0-2 drinks a day for men. Know how much alcohol is in your drink. In the [place] one drink equals one 12 oz bottle of beer (355 mL), one 5 oz glass of wine (148 mL), or one 1 oz glass of hard liquor (44 mL). Be sure to eat food along with drinking alcohol. Be aware that alcohol is absorbed quickly and may have lingering effects that may result in hypoglycemia later. Be sure to do ongoing glucose monitoring. Keep all follow-up visits. This is important. If you have diabetes: Always have a fast-acting carbohydrate (15 grams) option with you to treat low blood glucose. Follow your diabetes management plan as directed by your health care provider. Make sure you: Know the symptoms of hypoglycemia. It is important to treat it right away to prevent it from becoming severe. Check your blood glucose as often as told. Always check before and after exercise. Always  check your blood glucose before you drive a motorized vehicle. Take your medicines as told. Follow your meal plan. Eat on time, and do not skip meals. Share your diabetes management plan with people in your workplace, school, and household. Carry a medical alert card or wear medical alert jewelry. Where to find more information American Diabetes Association: www.diabetes.org Contact a health care provider if: You have problems keeping your blood glucose in your target range. You have frequent episodes of hypoglycemia. Get help right away if: You continue to have hypoglycemia symptoms after eating or drinking something that contains 15 grams of fast-acting carbohydrate, and you cannot get your blood glucose above 70 mg/dL (3.9 mmol/L) while following the 15:15 rule. Your blood glucose is below 54  mg/dL (3 mmol/L). You have a seizure. You faint. These symptoms may represent a serious problem that is an emergency. Do not wait to see if the symptoms will go away. Get medical help right away. Call your local emergency services (911 in the [place] Do not drive yourself to the hospital. Summary Hypoglycemia occurs when the level of sugar (glucose) in the blood is too low. Hypoglycemia can happen in people who have or do not have diabetes. It can develop quickly, and it can be a medical emergency. Make sure you know the symptoms of hypoglycemia and how to treat it. Always have a fast-acting carbohydrate option with you to treat low blood sugar. This information is not intended to replace advice given to you by your health care provider. Make sure you discuss any questions you have with your health care provider. Document Revised: 03/13/2020 Document Reviewed: 03/13/2020 Elsevier Patient Education  2022 [employer].

## 2021-02-25 NOTE — Progress Notes (Addendum)
Chief Complaint  Patient presents with   Diabetes   Annual Exam   Annual 1. Tongue cancer f/u Dr. Tami Ribas and [facility] ENT 08/2020 he had excision for removal no radiation or further tx decided not to to this and will f/u Q2 months CT 01/21/21 ENT neg recurrence and no lymph nodes and will f/u ent 04/08/21 Dr. Ihor Austin [facility] 2. Tooth pain 7/10 [facility] will see doctor he is having bridge issue Dr. Jeneen Rinks belcher and plans to take leftover narcotic he has from [facility] surgery when he gets home 3. Dm2 with htn controlled on lipitor 20 mg qhs jardiance 25 mg qd imdur 20 mg 24 hrs, losartan 25 mg qd , metformin 1000 mg  4. C/o bruising toenails and fungus will refer podiatry  Review of Systems  Constitutional:  Negative for weight loss.  HENT:  Negative for hearing loss.   Eyes:  Negative for blurred vision.  Respiratory:  Negative for shortness of breath.   Cardiovascular:  Negative for chest pain.  Gastrointestinal:  Negative for abdominal pain.  Musculoskeletal:  Negative for falls and joint pain.  Skin:  Negative for rash.  Psychiatric/Behavioral:  Negative for depression.   Past Medical History:  Diagnosis Date   Actinic keratoses    Allergy    Arthritis    low back pain s/p shots and blocks, knee pain   BCC (basal cell carcinoma of skin)    forehead and chest Dr. 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 Tami Ribas, [facility] H/o    Past Surgical History:  Procedure Laterality Date   BIOPSY TONGUE     11/11/15 SCC tongue    cancer of tongue  08/2020   removal of cancer   CARDIAC CATHETERIZATION N/A 11/10/2015   Procedure: Left Heart Cath and Coronary Angiography;  Surgeon: Wellington Hampshire, MD;  Location: [place] [facility];  Service: Cardiovascular;  Laterality: N/A;   COLONOSCOPY  05/2006   COLONOSCOPY WITH PROPOFOL N/A 05/24/2017   Procedure: COLONOSCOPY WITH PROPOFOL;  Surgeon: Lollie Sails, MD;  Location: [facility];  Service: Endoscopy;  Laterality: N/A;   EXCISION OF TONGUE LESION N/A 11/11/2015   Procedure: EXCISION OF TONGUE LESION/ WITH FROZEN SECTION;  Surgeon: Beverly Gust, MD;  Location: [facility] ORS;  Service: ENT;  Laterality: N/A;   REPLACEMENT TOTAL KNEE Left    REPLACEMENT TOTAL KNEE Right 04/2020   [place] [facility] [place] [facility]   right tka minimally invasive 05/06/20 Dr. Armandina Gemma     Family History  Problem Relation Age of Onset   Myelodysplastic syndrome Mother    Arthritis Mother    Emphysema Father  Alcohol abuse Father    COPD Father    Depression Father    Early death Maternal Grandfather    Heart disease Maternal Grandfather    Stroke Maternal Grandfather    Social History   Socioeconomic History   Marital status: Married    Spouse name: Not on file   Number of children: Not on file   Years of education: Not on file   Highest education level: Not on file  Occupational History   Not on file  Tobacco Use   Smoking status: Never   Smokeless tobacco: Never  Vaping Use   Vaping Use: Never used  Substance and Sexual Activity   Alcohol use: Never    Alcohol/week: 0.0 standard drinks   Drug use: No   Sexual activity: Yes  Other Topics Concern   Not on file  Social History Narrative   Married    3 sons    Forensic psychologist, 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     Owns guns, wears seat belt, safe in relationship   Social Determinants of Radio broadcast assistant Strain: Low Risk    Difficulty of Paying Living Expenses: Not hard at all  Food Insecurity: No Food Insecurity   Worried About Charity fundraiser in the Last Year: Never true   Arboriculturist in the Last Year: Never true  Transportation Needs: No Transportation Needs   Lack of Transportation (Medical): No   Lack of Transportation (Non-Medical): No  Physical Activity: Not on file  Stress: No Stress Concern Present   Feeling of Stress : Not at all  Social Connections: Unknown   Frequency of Communication with Friends and Family: More than three times a week   Frequency of Social Gatherings with Friends and Family: Not on file   Attends Religious Services: More than 4 times per year   Active Member of [employer] or Organizations: Yes   Attends Archivist Meetings: Not on file   Marital Status: Not on file  Intimate Partner Violence: Not At Risk   Fear of Current or Ex-Partner: No   Emotionally Abused: No   Physically Abused: No   Sexually Abused: No   Current Meds  Medication Sig   Acetaminophen (TYLENOL PO) Take by mouth.   aspirin 81 MG tablet Take 81 mg by mouth daily.   atorvastatin (LIPITOR) 20 MG tablet Take 1 tablet (20 mg total) by mouth daily at 6 PM. At night   bisoprolol-hydrochlorothiazide (ZIAC) 2.5-6.25 MG tablet Take 1 tablet by mouth daily.   chlorhexidine (PERIDEX) 0.12 % solution SMARTSIG:By Mouth   clopidogrel (PLAVIX) 75 MG tablet Take 1 tablet (75 mg total) by mouth daily.   glucose blood test strip E 11.9 qd Once Daily one touch   isosorbide mononitrate (IMDUR) 30 MG 24 hr tablet TAKE ONE TABLET BY MOUTH EVERY MORNING AND TAKE TWO TABLETS BY MOUTH EVERY EVENING   Lancets (ONETOUCH ULTRASOFT) lancets Qd E 11.9 Once daily   liothyronine (CYTOMEL) 5 MCG tablet Take 0.5 tablets (2.5 mcg total) by mouth every other day.   metFORMIN (GLUCOPHAGE) 1000 MG  tablet Take 1 tablet (1,000 mg total) by mouth daily with breakfast.   Multiple Vitamins-Minerals (CENTRUM SILVER 50+MEN PO) Take 1 tablet by mouth every morning.    pantoprazole (PROTONIX) 40 MG tablet Take 1 tablet (40 mg total) by mouth daily.   [DISCONTINUED] empagliflozin (JARDIANCE) 25 MG TABS tablet Take 1 tablet (25 mg total) by mouth daily.   [  DISCONTINUED] glimepiride (AMARYL) 1 MG tablet TAKE ONE TABLET BY MOUTH EVERY MORNING AND TAKE ONE TABLET BY MOUTH WITH SUPPER   [DISCONTINUED] levothyroxine (SYNTHROID) 200 MCG tablet Take 1 tablet (200 mcg total) by mouth daily. In am on empty stomach   [DISCONTINUED] losartan (COZAAR) 25 MG tablet Take 1 tablet (25 mg total) by mouth daily. Stop ramipril 20 mg qd   Allergies  Allergen Reactions   Penicillins Hives, Rash, Other (See Comments) and Swelling    Has patient had a PCN reaction causing immediate rash, facial/tongue/throat swelling, SOB or lightheadedness with hypotension: Yes Has patient had a PCN reaction causing severe rash involving mucus membranes or skin necrosis: No Has patient had a PCN reaction that required hospitalization No Has patient had a PCN reaction occurring within the last 10 years: No If all of the above answers are "NO", then may proceed with Cephalosporin use.    Recent Results (from the past 2160 hour(s))  Microalbumin / creatinine urine ratio     Status: None   Collection Time: 12/03/20  8:09 AM  Result Value Ref Range   Creatinine, Urine 61 20 - 320 mg/dL   Microalb, Ur 0.7 mg/dL    Comment: Reference Range Not established    Microalb Creat Ratio 11 <30 mcg/mg creat    Comment: . The ADA defines abnormalities in albumin excretion as follows: Marland Kitchen Albuminuria Category        Result (mcg/mg creatinine) . Normal to Mildly increased   <30 Moderately increased         30-299  Severely increased           > OR = 300 . The ADA recommends that at least two of three specimens collected within a 3-6 month  period be abnormal before considering a patient to be within a diagnostic category.   TSH     Status: None   Collection Time: 12/03/20  8:09 AM  Result Value Ref Range   TSH 0.57 0.35 - 5.50 uIU/mL  Hemoglobin A1c     Status: Abnormal   Collection Time: 12/03/20  8:09 AM  Result Value Ref Range   Hgb A1c MFr Bld 6.8 (H) 4.6 - 6.5 %    Comment: Glycemic Control Guidelines for People with Diabetes:Non Diabetic:  <6%Goal of Therapy: <7%Additional Action Suggested:  >8%   CBC w/Diff     Status: None   Collection Time: 12/03/20  8:09 AM  Result Value Ref Range   WBC 7.1 4.0 - 10.5 K/uL   RBC 5.16 4.22 - 5.81 Mil/uL   Hemoglobin 15.1 13.0 - 17.0 g/dL   HCT 46.3 39.0 - 52.0 %   MCV 89.9 78.0 - 100.0 fl   MCHC 32.6 30.0 - 36.0 g/dL   RDW 15.4 11.5 - 15.5 %   Platelets 205.0 150.0 - 400.0 K/uL   Neutrophils Relative % 57.8 43.0 - 77.0 %   Lymphocytes Relative 30.4 12.0 - 46.0 %   Monocytes Relative 9.3 3.0 - 12.0 %   Eosinophils Relative 1.7 0.0 - 5.0 %   Basophils Relative 0.8 0.0 - 3.0 %   Neutro Abs 4.1 1.4 - 7.7 K/uL   Lymphs Abs 2.1 0.7 - 4.0 K/uL   Monocytes Absolute 0.7 0.1 - 1.0 K/uL   Eosinophils Absolute 0.1 0.0 - 0.7 K/uL   Basophils Absolute 0.1 0.0 - 0.1 K/uL  Lipid panel     Status: Abnormal   Collection Time: 12/03/20  8:09 AM  Result Value Ref Range  Cholesterol 89 0 - 200 mg/dL    Comment: ATP III Classification       Desirable:  < 200 mg/dL               Borderline High:  200 - 239 mg/dL          High:  > = 240 mg/dL   Triglycerides 152.0 (H) 0.0 - 149.0 mg/dL    Comment: Normal:  <150 mg/dLBorderline High:  150 - 199 mg/dL   HDL 30.50 (L) >39.00 mg/dL   VLDL 30.4 0.0 - 40.0 mg/dL   LDL Cholesterol 28 0 - 99 mg/dL   Total CHOL/HDL Ratio 3     Comment:                Men          Women1/2 Average Risk     3.4          3.3Average Risk          5.0          4.42X Average Risk          9.6          7.13X Average Risk          15.0          11.0                        NonHDL 58.52     Comment: NOTE:  Non-HDL goal should be 30 mg/dL higher than patient's LDL goal (i.e. LDL goal of < 70 mg/dL, would have non-HDL goal of < 100 mg/dL)  Comprehensive metabolic panel     Status: Abnormal   Collection Time: 12/03/20  8:09 AM  Result Value Ref Range   Sodium 137 135 - 145 mEq/L   Potassium 5.2 No hemolysis seen (H) 3.5 - 5.1 mEq/L   Chloride 101 96 - 112 mEq/L   CO2 27 19 - 32 mEq/L   Glucose, Bld 101 (H) 70 - 99 mg/dL   BUN 13 6 - 23 mg/dL   Creatinine, Ser 1.10 0.40 - 1.50 mg/dL   Total Bilirubin 1.1 0.2 - 1.2 mg/dL   Alkaline Phosphatase 97 39 - 117 U/L   AST 22 0 - 37 U/L   ALT 17 0 - 53 U/L   Total Protein 7.4 6.0 - 8.3 g/dL   Albumin 4.0 3.5 - 5.2 g/dL   GFR 65.66 >60.00 mL/min    Comment: Calculated using the CKD-EPI Creatinine Equation (2021)   Calcium 9.3 8.4 - 10.5 mg/dL  Basic Metabolic Panel (BMET)     Status: Abnormal   Collection Time: 12/10/20  2:00 PM  Result Value Ref Range   Glucose, Bld 129 (H) 65 - 99 mg/dL    Comment: .            Fasting reference interval . For someone without known diabetes, a glucose value >125 mg/dL indicates that they may have diabetes and this should be confirmed with a follow-up test. .    BUN 15 7 - 25 mg/dL   Creat 1.17 0.70 - 1.28 mg/dL   BUN/Creatinine Ratio NOT APPLICABLE 6 - 22 (calc)   Sodium 139 135 - 146 mmol/L   Potassium 4.9 3.5 - 5.3 mmol/L   Chloride 102 98 - 110 mmol/L   CO2 27 20 - 32 mmol/L   Calcium 9.1 8.6 - 10.3 mg/dL  HM DIABETES EYE EXAM  Status: None   Collection Time: 02/13/21 12:00 AM  Result Value Ref Range   HM Diabetic Eye Exam No Retinopathy No Retinopathy  HM DIABETES EYE EXAM     Status: None   Collection Time: 02/17/21 12:00 AM  Result Value Ref Range   HM Diabetic Eye Exam No Retinopathy No Retinopathy    Comment: [place]   Objective  Body mass index is 34.9 kg/m. Wt Readings from Last 3 Encounters:  02/25/21 243 lb 3.2 oz (110.3 kg)  02/20/21  230 lb (104.3 kg)  01/26/21 238 lb 6.4 oz (108.1 kg)   Temp Readings from Last 3 Encounters:  02/25/21 (!) 97.5 F (36.4 C) (Oral)  01/26/21 98.1 F (36.7 C)  01/07/20 98 F (36.7 C)   BP Readings from Last 3 Encounters:  02/25/21 124/70  01/26/21 140/60  11/27/20 (!) 159/75   Pulse Readings from Last 3 Encounters:  02/25/21 (!) 55  01/26/21 63  11/27/20 60    Physical Exam Vitals and nursing note reviewed.  Constitutional:      Appearance: Normal appearance. He is well-developed and well-groomed. He is obese.  HENT:     Head: Normocephalic and atraumatic.  Eyes:     Conjunctiva/sclera: Conjunctivae normal.     Pupils: Pupils are equal, round, and reactive to light.  Cardiovascular:     Rate and Rhythm: Normal rate and regular rhythm.     Heart sounds: Normal heart sounds. No murmur heard. Pulmonary:     Effort: Pulmonary effort is normal.     Breath sounds: Normal breath sounds.  Abdominal:     Tenderness: There is no abdominal tenderness.  Skin:    General: Skin is warm and dry.  Neurological:     General: No focal deficit present.     Mental Status: He is alert and oriented to person, place, and time. Mental status is at baseline.     Gait: Gait normal.  Psychiatric:        Attention and Perception: Attention and perception normal.        Mood and Affect: Mood and affect normal.        Speech: Speech normal.        Behavior: Behavior normal. Behavior is cooperative.        Thought Content: Thought content normal.        Cognition and Memory: Cognition and memory normal.        Judgment: Judgment normal.    Assessment  Plan  Annual physical exam  Type 2 diabetes mellitus controlled with htn- Plan: glimepiride (AMARYL) 1 MG tablet, Ambulatory referral to Podiatry, empagliflozin (JARDIANCE) 25 MG TABS tablet on lipitor 20 mg qhs jardiance 25 mg qd imdur 20 mg 24 hrs, losartan 25 mg qd , metformin 1000 mg   Contusion of toenail, unspecified laterality,  sequela - Plan: Ambulatory referral to Podiatry Onychomycosis - Plan: Ambulatory referral to Podiatry  Centrilobular emphysema ([place]) noted ct 01/21/21 unc incidental  Nodule of right lung see above  Hypothyroidism, unspecified type - Plan: levothyroxine (SYNTHROID) 200 MCG tablet on cytomel cont both   HM Flu shot utd prevnar had 02/03/18 Tdap had 08/02/2018  zostervax per pt had in the past  Had 2/2 shigrix  pna 23 utd  covid 19 vx 3/3 consider booster declines h/o cogid 19+   Colonoscopy 05/24/2017 diverticulosis no etiology for diarrhea -repeat in 10 years Dr. Gustavo Lah   PSA 0.90 h/o mild BPH on imaging f/u urology Dr. Bernardo Heater 04/23/20 f/u  annually vs prn no further PSA testing rec.    Dr. Sharlet Salina as of 11/14/19 back issues ok  Derm Dr. Evorn Gong h/o BCC forehead and chest saw 9 or 01/2019 ln2 to scalp still unresolved as of 02/28/2019 rec call back for more ln2 before 1 year  Derm 05/2019 ln2 to scalp f/u in 6 months  Residual scalp Aks x 2 f/u dermatology, scc superficial nose tx'ed as of 11/14/19 Fu Q 6 months appt sch 07/28/20    ENT Dr. Bernestine Amass [facility] cancer f/u Q2 months due 06/10/21   Dr. Tami Ribas ENT    [place] [facility] Dr. Merla Riches Dentist Dr. Eugenie Birks Cardiology Dr. Saunders [facility] Dr. Gustavo Lah  Dr. Nash Mantis ortho right min. Invasive TKA 05/06/20 in [place]   Rec healthy diet and exercise   Provider: Dr. Olivia Mackie [facility]

## 2021-02-27 DIAGNOSIS — S90229A Contusion of unspecified lesser toe(s) with damage to nail, initial encounter: Secondary | ICD-10-CM | POA: Insufficient documentation

## 2021-02-27 DIAGNOSIS — B351 Tinea unguium: Secondary | ICD-10-CM

## 2021-02-27 DIAGNOSIS — Z0000 Encounter for general adult medical examination without abnormal findings: Secondary | ICD-10-CM | POA: Insufficient documentation

## 2021-02-27 HISTORY — DX: Encounter for general adult medical examination without abnormal findings: Z00.00

## 2021-02-27 HISTORY — DX: Contusion of unspecified lesser toe(s) with damage to nail, initial encounter: S90.229A

## 2021-02-27 HISTORY — DX: Tinea unguium: B35.1

## 2021-02-27 MED ORDER — LOSARTAN POTASSIUM 25 MG PO TABS: 25.0000 mg | ORAL_TABLET | Freq: Every day | ORAL | 3 refills | Status: DC

## 2021-02-27 MED ORDER — LEVOTHYROXINE SODIUM 200 MCG PO TABS: 200.0000 ug | ORAL_TABLET | Freq: Every day | ORAL | 3 refills | Status: DC

## 2021-02-27 MED ORDER — EMPAGLIFLOZIN 25 MG PO TABS: 25.0000 mg | ORAL_TABLET | Freq: Every day | ORAL | 3 refills | Status: DC

## 2021-03-11 ENCOUNTER — Ambulatory Visit: Payer: Medicare Other | Admitting: Internal Medicine

## 2021-03-18 ENCOUNTER — Encounter: Payer: Self-pay | Admitting: Internal Medicine

## 2021-03-18 ENCOUNTER — Other Ambulatory Visit: Payer: Self-pay | Admitting: Internal Medicine

## 2021-03-18 NOTE — Telephone Encounter (Signed)
Please contact pt for future appointment. Pt overdue for 12 month f/u. Pt needing refills must be seen yearly.

## 2021-03-23 ENCOUNTER — Ambulatory Visit (INDEPENDENT_AMBULATORY_CARE_PROVIDER_SITE_OTHER): Payer: Medicare Other | Admitting: Internal Medicine

## 2021-03-23 ENCOUNTER — Encounter: Payer: Self-pay | Admitting: Internal Medicine

## 2021-03-23 ENCOUNTER — Other Ambulatory Visit: Payer: Self-pay

## 2021-03-23 VITALS — BP 120/62 | HR 67 | Ht 70.0 in | Wt 240.0 lb

## 2021-03-23 DIAGNOSIS — E1169 Type 2 diabetes mellitus with other specified complication: Secondary | ICD-10-CM | POA: Diagnosis not present

## 2021-03-23 DIAGNOSIS — I10 Essential (primary) hypertension: Secondary | ICD-10-CM

## 2021-03-23 DIAGNOSIS — E785 Hyperlipidemia, unspecified: Secondary | ICD-10-CM | POA: Diagnosis not present

## 2021-03-23 DIAGNOSIS — I25118 Atherosclerotic heart disease of native coronary artery with other forms of angina pectoris: Secondary | ICD-10-CM

## 2021-03-23 DIAGNOSIS — I38 Endocarditis, valve unspecified: Secondary | ICD-10-CM | POA: Insufficient documentation

## 2021-03-23 HISTORY — DX: Endocarditis, valve unspecified: I38

## 2021-03-23 NOTE — Patient Instructions (Signed)
Medication Instructions:   - Stop aspirin.  *If you need a refill on your cardiac medications before your next appointment, please call your pharmacy*   Lab Work: None.  Testing/Procedures: None.  Follow-Up: At [facility], you and your health needs are our priority.  As part of our continuing mission to provide you with exceptional heart care, we have created designated Provider Care Teams.  These Care Teams include your primary Cardiologist (physician) and Advanced Practice Providers (APPs -  Physician Assistants and Nurse Practitioners) who all work together to provide you with the care you need, when you need it.  We recommend signing up for the patient portal called "MyChart".  Sign up information is provided on this After Visit Summary.  MyChart is used to connect with patients for Virtual Visits (Telemedicine).  Patients are able to view lab/test results, encounter notes, upcoming appointments, etc.  Non-urgent messages can be sent to your provider as well.   To learn more about what you can do with MyChart, go to [email or web address].    Your next appointment:   1 year(s)  The format for your next appointment:   In Person  Provider:   You may see Nelva Bush, MD or one of the following Advanced Practice Providers on your designated Care Team:   Murray Hodgkins, NP Christell Faith, PA-C Cadence Kathlen Mody, [place]

## 2021-03-23 NOTE — Progress Notes (Signed)
Follow-up Outpatient Visit Date: 03/23/2021  Primary Care Provider: McLean-Scocuzza, Nino Glow, MD [place] [address]  Chief Complaint: Follow-up coronary artery disease  HPI:  Tony Lawrence is a 75 y.o. male with history of coronary artery disease with chronic total occlusion of the mid LAD, hypertension, hyperlipidemia, diabetes mellitus, obstructive sleep apnea, and hypothyroidism, who presents for follow-up of coronary artery disease.  I last saw him in 11/2019, at which time he felt well other than chronic knee pain.  Echocardiogram showed normal LVEF with mild AI and mild enlargement of the ascending aorta.  CT of the chest with contrast at [facility] in 12/2020 reports normal caliber thoracic aorta.  Today, Tony Lawrence reports that he has been feeling well from a heart standpoint, denying chest pain, shortness of breath, palpitations, lightheadedness, or edema.  Since our last visit, he has undergone further knee replacement surgery as well as excision of a tongue cancer.  He opted against radiation therapy.  He is exercising some.  --------------------------------------------------------------------------------------------------  Past Medical History:  Diagnosis Date   Actinic keratoses    Allergy    Arthritis    low back pain s/p shots and blocks, knee pain   BCC (basal cell carcinoma of skin)    forehead and chest Dr. 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 Tami Ribas, [facility]    Past Surgical History:  Procedure Laterality Date   BIOPSY TONGUE     11/11/15 SCC tongue    cancer of tongue  08/2020   removal of cancer   CARDIAC CATHETERIZATION N/A 11/10/2015   Procedure: Left Heart Cath and Coronary Angiography;  Surgeon: Wellington Hampshire, MD;  Location: [facility];  Service: Cardiovascular;  Laterality: N/A;   COLONOSCOPY  05/2006   COLONOSCOPY WITH PROPOFOL N/A 05/24/2017   Procedure: COLONOSCOPY WITH PROPOFOL;  Surgeon: Lollie Sails, MD;  Location: [facility] ENDOSCOPY;  Service: Endoscopy;  Laterality: N/A;   EXCISION OF TONGUE LESION N/A 11/11/2015   Procedure: EXCISION OF TONGUE LESION/ WITH FROZEN SECTION;  Surgeon: Beverly Gust, MD;  Location: [facility] ORS;  Service: ENT;  Laterality: N/A;   REPLACEMENT TOTAL KNEE Left    REPLACEMENT TOTAL KNEE Right 04/2020   [place] [facility]   right tka minimally invasive 05/06/20 Dr. Armandina Gemma       Current Meds  Medication Sig   Acetaminophen (TYLENOL PO) Take 1,300 mg by mouth every other day.   atorvastatin (LIPITOR) 20 MG tablet Take 1 tablet (20 mg total) by mouth daily at 6 PM. At night   bisoprolol-hydrochlorothiazide (ZIAC) 2.5-6.25 MG tablet Take 1 tablet by mouth daily.  chlorhexidine (PERIDEX) 0.12 % solution Use as directed in the mouth or throat as needed.   clopidogrel (PLAVIX) 75 MG tablet Take 1 tablet (75 mg total) by mouth daily.   empagliflozin (JARDIANCE) 25 MG TABS tablet Take 1 tablet (25 mg total) by mouth daily.   glimepiride (AMARYL) 1 MG tablet Take 1 tablet (1 mg total) by mouth daily with breakfast.   glucose blood test strip E 11.9 qd Once Daily one touch   isosorbide mononitrate (IMDUR) 30 MG 24 hr tablet TAKE ONE TABLET BY MOUTH EVERY MORNING AND TAKE TWO TABLETS BY MOUTH EVERY EVENING    Lancets (ONETOUCH ULTRASOFT) lancets Qd E 11.9 Once daily   levothyroxine (SYNTHROID) 200 MCG tablet Take 1 tablet (200 mcg total) by mouth daily. In am on empty stomach   liothyronine (CYTOMEL) 5 MCG tablet Take 0.5 tablets (2.5 mcg total) by mouth every other day.   losartan (COZAAR) 25 MG tablet Take 1 tablet (25 mg total) by mouth daily. Stop ramipril 20 mg qd   metFORMIN (GLUCOPHAGE) 1000 MG tablet Take 1 tablet (1,000 mg total) by mouth daily with breakfast.   Multiple Vitamins-Minerals (CENTRUM SILVER 50+MEN PO) Take 1 tablet by mouth every morning.    pantoprazole (PROTONIX) 40 MG tablet Take 1 tablet (40 mg total) by mouth daily.   [DISCONTINUED] aspirin 81 MG tablet Take 81 mg by mouth daily.    Allergies: Penicillins  Social History   Tobacco Use   Smoking status: Never   Smokeless tobacco: Never  Vaping Use   Vaping Use: Never used  Substance Use Topics   Alcohol use: Never    Alcohol/week: 0.0 standard drinks   Drug use: No    Family History  Problem Relation Age of Onset   Myelodysplastic syndrome Mother    Arthritis Mother    Emphysema Father    Alcohol abuse Father    COPD Father    Depression Father    Early death Maternal Grandfather    Heart disease Maternal Grandfather    Stroke Maternal Grandfather     Review of Systems: A 12-system review of systems was performed and was negative except as noted in the HPI.  --------------------------------------------------------------------------------------------------  Physical Exam: BP 120/62 (BP Location: Left Arm, Patient Position: Sitting, Cuff Size: Large)   Pulse 67   Ht 5\' 10"  (1.778 m)   Wt 240 lb (108.9 kg)   SpO2 98%   BMI 34.44 kg/m   General:  NAD. Neck: No JVD or HJR. Lungs: Clear to auscultation bilaterally without wheezes or crackles. Heart: Regular rate and rhythm without murmurs, rubs, or gallops. Abdomen: Soft, nontender, nondistended. Extremities: No lower extremity edema.  EKG:  Normal sinus rhythm with left axis deviation.  No significant change from prior tracing on 12/21/2019.  Lab Results  Component Value Date   WBC 7.1 12/03/2020   HGB 15.1 12/03/2020   HCT 46.3 12/03/2020   MCV 89.9 12/03/2020   PLT 205.0 12/03/2020    Lab Results  Component Value Date   NA 139 12/10/2020   K 4.9 12/10/2020   CL 102 12/10/2020   CO2 27 12/10/2020   BUN 15 12/10/2020   CREATININE 1.17 12/10/2020   GLUCOSE 129 (H) 12/10/2020   ALT 17 12/03/2020    Lab Results  Component Value Date   CHOL 89 12/03/2020   HDL 30.50 (L) 12/03/2020   LDLCALC 28 12/03/2020   TRIG 152.0 (H) 12/03/2020   CHOLHDL 3 12/03/2020    --------------------------------------------------------------------------------------------------  ASSESSMENT AND PLAN: Coronary artery disease with stable angina: No symptoms to suggest worsening coronary insufficiency reported.  We have agreed to stop low-dose aspirin and continue with indefinite clopidogrel 75 mg daily as well as antianginal therapy with bisoprolol and isosorbide mononitrate.  Valvular heart disease: Echocardiogram of a year ago showed mild aortic regurgitation and mild stenosis of the mitral valve without regurgitation.  No heart failure symptoms reported by Tony Lawrence.  Continue current medical therapy.  Hypertension: Blood pressure well controlled today.  Continue current medications.  Hyperlipidemia associated with type 2 diabetes mellitus: LDL very well controlled on last check in 11/2020.  Triglycerides borderline.  Continue atorvastatin 20 mg daily with ongoing lifestyle modifications.  Follow-up: Return to clinic in 1 year.  Nelva Bush, MD 03/23/2021 2:50 PM

## 2021-04-03 ENCOUNTER — Ambulatory Visit (INDEPENDENT_AMBULATORY_CARE_PROVIDER_SITE_OTHER): Payer: Medicare Other | Admitting: Podiatry

## 2021-04-03 ENCOUNTER — Other Ambulatory Visit: Payer: Self-pay

## 2021-04-03 DIAGNOSIS — M79674 Pain in right toe(s): Secondary | ICD-10-CM

## 2021-04-03 DIAGNOSIS — B351 Tinea unguium: Secondary | ICD-10-CM

## 2021-04-03 DIAGNOSIS — M79675 Pain in left toe(s): Secondary | ICD-10-CM | POA: Diagnosis not present

## 2021-04-06 NOTE — Progress Notes (Signed)
   SUBJECTIVE Patient presents to office today complaining of elongated, thickened nails that cause pain while ambulating in shoes.  Patient is unable to trim their own nails. Patient is here for further evaluation and treatment.  Past Medical History:  Diagnosis Date   Actinic keratoses    Allergy    Arthritis    low back pain s/p shots and blocks, knee pain   BCC (basal cell carcinoma of skin)    forehead and chest Dr. 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 Tami Ribas, [facility]     OBJECTIVE General Patient is awake, alert, and oriented x 3 and in no acute distress. Derm Skin is dry and supple bilateral. Negative open lesions or macerations. Remaining integument unremarkable. Nails are tender, long, thickened and dystrophic with subungual debris, consistent with onychomycosis, 1-5 bilateral. No signs of infection noted. Vasc  DP and PT pedal pulses palpable bilaterally. Temperature gradient within normal limits.  Neuro Epicritic and protective threshold sensation grossly intact bilaterally.  Musculoskeletal Exam No symptomatic pedal deformities noted bilateral. Muscular strength within normal limits.  ASSESSMENT 1.  Pain due to onychomycosis of toenails both  PLAN OF CARE 1. Patient evaluated today.  2. Instructed to maintain  good pedal hygiene and foot care.  3. Mechanical debridement of nails 1-5 bilaterally performed using a nail nipper. Filed with dremel without incident.  4.  OTC  tolcylen antifungal topical provided.  Apply daily  5.  Return to clinic in 3 mos.    Edrick Kins, DPM [facility]  Dr. Edrick Kins, DPM    [address], [place] [address]                Office [phone]  Fax [phone]

## 2021-04-08 DIAGNOSIS — C029 Malignant neoplasm of tongue, unspecified: Secondary | ICD-10-CM | POA: Diagnosis not present

## 2021-04-09 ENCOUNTER — Other Ambulatory Visit: Payer: Self-pay

## 2021-04-09 ENCOUNTER — Other Ambulatory Visit (INDEPENDENT_AMBULATORY_CARE_PROVIDER_SITE_OTHER): Payer: Medicare Other

## 2021-04-09 DIAGNOSIS — E039 Hypothyroidism, unspecified: Secondary | ICD-10-CM

## 2021-04-09 DIAGNOSIS — I152 Hypertension secondary to endocrine disorders: Secondary | ICD-10-CM | POA: Diagnosis not present

## 2021-04-09 DIAGNOSIS — E1159 Type 2 diabetes mellitus with other circulatory complications: Secondary | ICD-10-CM

## 2021-04-09 LAB — LIPID PANEL
Cholesterol: 84 mg/dL (ref 0–200)
HDL: 26.1 mg/dL — ABNORMAL LOW (ref >39.00)
LDL Cholesterol: 29 mg/dL (ref 0–99)
NonHDL: 57.44
Total CHOL/HDL Ratio: 3
Triglycerides: 142 mg/dL (ref 0.0–149.0)
VLDL: 28.4 mg/dL (ref 0.0–40.0)

## 2021-04-09 LAB — COMPREHENSIVE METABOLIC PANEL
ALT: 23 U/L (ref 0–53)
AST: 25 U/L (ref 0–37)
Albumin: 4 g/dL (ref 3.5–5.2)
Alkaline Phosphatase: 99 U/L (ref 39–117)
BUN: 11 mg/dL (ref 6–23)
CO2: 29 mEq/L (ref 19–32)
Calcium: 8.9 mg/dL (ref 8.4–10.5)
Chloride: 102 mEq/L (ref 96–112)
Creatinine, Ser: 1.1 mg/dL (ref 0.40–1.50)
GFR: 65.5 mL/min (ref >60.00)
Glucose, Bld: 91 mg/dL (ref 70–99)
Potassium: 4.2 mEq/L (ref 3.5–5.1)
Sodium: 139 mEq/L (ref 135–145)
Total Bilirubin: 0.9 mg/dL (ref 0.2–1.2)
Total Protein: 6.9 g/dL (ref 6.0–8.3)

## 2021-04-09 LAB — CBC WITH DIFFERENTIAL/PLATELET
Basophils Absolute: 0 10*3/uL (ref 0.0–0.1)
Basophils Relative: 0.6 % (ref 0.0–3.0)
Eosinophils Absolute: 0.1 10*3/uL (ref 0.0–0.7)
Eosinophils Relative: 1.9 % (ref 0.0–5.0)
HCT: 46.9 % (ref 39.0–52.0)
Hemoglobin: 15.4 g/dL (ref 13.0–17.0)
Lymphocytes Relative: 27 % (ref 12.0–46.0)
Lymphs Abs: 1.8 10*3/uL (ref 0.7–4.0)
MCHC: 32.8 g/dL (ref 30.0–36.0)
MCV: 86.5 fl (ref 78.0–100.0)
Monocytes Absolute: 0.7 10*3/uL (ref 0.1–1.0)
Monocytes Relative: 10.5 % (ref 3.0–12.0)
Neutro Abs: 4.1 10*3/uL (ref 1.4–7.7)
Neutrophils Relative %: 60 % (ref 43.0–77.0)
Platelets: 153 10*3/uL (ref 150.0–400.0)
RBC: 5.42 Mil/uL (ref 4.22–5.81)
RDW: 15.7 % — ABNORMAL HIGH (ref 11.5–15.5)
WBC: 6.8 10*3/uL (ref 4.0–10.5)

## 2021-04-09 LAB — TSH: TSH: 0.87 u[IU]/mL (ref 0.35–5.50)

## 2021-04-09 LAB — HEMOGLOBIN A1C: Hgb A1c MFr Bld: 6.9 % — ABNORMAL HIGH (ref 4.6–6.5)

## 2021-04-16 DIAGNOSIS — D2262 Melanocytic nevi of left upper limb, including shoulder: Secondary | ICD-10-CM | POA: Diagnosis not present

## 2021-04-16 DIAGNOSIS — X32XXXA Exposure to sunlight, initial encounter: Secondary | ICD-10-CM | POA: Diagnosis not present

## 2021-04-16 DIAGNOSIS — L570 Actinic keratosis: Secondary | ICD-10-CM | POA: Diagnosis not present

## 2021-04-16 DIAGNOSIS — D2271 Melanocytic nevi of right lower limb, including hip: Secondary | ICD-10-CM | POA: Diagnosis not present

## 2021-04-16 DIAGNOSIS — D2261 Melanocytic nevi of right upper limb, including shoulder: Secondary | ICD-10-CM | POA: Diagnosis not present

## 2021-04-16 DIAGNOSIS — Z85828 Personal history of other malignant neoplasm of skin: Secondary | ICD-10-CM | POA: Diagnosis not present

## 2021-06-03 ENCOUNTER — Other Ambulatory Visit: Payer: Self-pay

## 2021-06-03 ENCOUNTER — Encounter: Payer: Self-pay | Admitting: Internal Medicine

## 2021-06-03 ENCOUNTER — Ambulatory Visit (INDEPENDENT_AMBULATORY_CARE_PROVIDER_SITE_OTHER): Payer: Medicare Other | Admitting: Internal Medicine

## 2021-06-03 VITALS — Ht 70.0 in | Wt 235.2 lb

## 2021-06-03 DIAGNOSIS — R112 Nausea with vomiting, unspecified: Secondary | ICD-10-CM

## 2021-06-03 DIAGNOSIS — R197 Diarrhea, unspecified: Secondary | ICD-10-CM | POA: Diagnosis not present

## 2021-06-03 NOTE — Telephone Encounter (Signed)
For your information  

## 2021-06-03 NOTE — Progress Notes (Signed)
telephone Note  I connected with Tony Lawrence  on 06/03/21 at 11:50 AM EST by telephone and verified that I am speaking with the correct person using two identifiers.  Location patient: [place] Location provider:work or home office Persons participating in the virtual visit: patient, provider  I discussed the limitations and requested verbal permission for telemedicine visit. The patient expressed understanding and agreed to proceed.   HPI:  Acute telemedicine visit for : Monday pm had n/v/d 5-6 episodes of diarrhea has baseline diarrhea with metformin 1000 mg qd but freq increased and was watery and brown no fever, ab pain diarrhea/n/v lasted until Tuesday bedtime tried immodium, zofran odt  Monday for dinner had turnip greens, white beans, corn bread and sweet potatoe  Doing better  -Pertinent past medical history: see below -Pertinent medication allergies: Allergies  Allergen Reactions   Penicillins Hives, Rash, Other (See Comments) and Swelling    Has patient had a PCN reaction causing immediate rash, facial/tongue/throat swelling, SOB or lightheadedness with hypotension: Yes Has patient had a PCN reaction causing severe rash involving mucus membranes or skin necrosis: No Has patient had a PCN reaction that required hospitalization No Has patient had a PCN reaction occurring within the last 10 years: No If all of the above answers are "NO", then may proceed with Cephalosporin use.    -COVID-19 vaccine status:  Immunization History  Administered Date(s) Administered   Influenza Inj Mdck Quad Pf 01/11/2018   Influenza, High Dose Seasonal PF 03/25/2020, 01/19/2021   Influenza,inj,quad, With Preservative 01/11/2018   Influenza-Unspecified 02/10/2016, 01/25/2019, 02/23/2019   PFIZER(Purple Top)SARS-COV-2 Vaccination 06/09/2019, 07/03/2019, 03/25/2020   Pneumococcal Polysaccharide-23 03/01/2019   Tdap 08/02/2018   Zoster Recombinat (Shingrix) 12/01/2018, 02/07/2019     ROS: See  pertinent positives and negatives per HPI.  Past Medical History:  Diagnosis Date   Actinic keratoses    Allergy    Arthritis    low back pain s/p shots and blocks, knee pain   BCC (basal cell carcinoma of skin)    forehead and chest Dr. 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 Tami Ribas, [facility]     Past Surgical History:  Procedure Laterality Date   BIOPSY TONGUE     11/11/15 SCC tongue    cancer of tongue  08/2020   removal of cancer   CARDIAC CATHETERIZATION N/A 11/10/2015   Procedure: Left Heart Cath and Coronary Angiography;  Surgeon: Wellington Hampshire, MD;  Location: [facility];  Service: Cardiovascular;  Laterality: N/A;   COLONOSCOPY  05/2006   COLONOSCOPY WITH PROPOFOL N/A 05/24/2017   Procedure: COLONOSCOPY WITH PROPOFOL;  Surgeon: Lollie Sails, MD;  Location: [facility] ENDOSCOPY;  Service: Endoscopy;  Laterality: N/A;   EXCISION OF TONGUE LESION N/A 11/11/2015   Procedure: EXCISION OF TONGUE LESION/ WITH FROZEN SECTION;  Surgeon: Beverly Gust, MD;  Location: [facility] ORS;  Service: ENT;  Laterality: N/A;   REPLACEMENT TOTAL KNEE Left    REPLACEMENT TOTAL KNEE Right 04/2020   [place] [facility]   right tka minimally invasive 05/06/20 Dr. Armandina Gemma       Current Outpatient Medications:     Acetaminophen (TYLENOL PO), Take 1,300 mg by mouth every other day., Disp: , Rfl:    atorvastatin (LIPITOR) 20 MG tablet, Take 1 tablet (20 mg total) by mouth daily at 6 PM. At night, Disp: 90 tablet, Rfl: 3   bisoprolol-hydrochlorothiazide (ZIAC) 2.5-6.25 MG tablet, Take 1 tablet by mouth daily., Disp: 90 tablet, Rfl: 3   clopidogrel (PLAVIX) 75 MG tablet, TAKE ONE TABLET BY MOUTH DAILY, Disp: 90 tablet, Rfl: 2   empagliflozin (JARDIANCE) 25 MG TABS tablet, Take 1 tablet (25 mg total) by mouth daily., Disp: 90 tablet, Rfl: 3   glimepiride (AMARYL) 1 MG tablet, Take 1 tablet (1 mg total) by mouth daily with breakfast., Disp: 90 tablet, Rfl: 3   glucose blood test strip, E 11.9 qd Once Daily one touch, Disp: 100 each, Rfl: 12   isosorbide mononitrate (IMDUR) 30 MG 24 hr tablet, TAKE ONE TABLET BY MOUTH EVERY MORNING AND TAKE TWO TABLETS BY MOUTH EVERY EVENING, Disp: 270 tablet, Rfl: 2   Lancets (ONETOUCH ULTRASOFT) lancets, Qd E 11.9 Once daily, Disp: 100 each, Rfl: 12   levothyroxine (SYNTHROID) 200 MCG tablet, Take 1 tablet (200 mcg total) by mouth daily. In am on empty stomach, Disp: 90 tablet, Rfl: 3   liothyronine (CYTOMEL) 5 MCG tablet, Take 0.5 tablets (2.5 mcg total) by mouth every other day., Disp: 45 tablet, Rfl: 3   losartan (COZAAR) 25 MG tablet, Take 1 tablet (25 mg total) by mouth daily. Stop ramipril 20 mg qd, Disp: 90 tablet, Rfl: 3   metFORMIN (GLUCOPHAGE) 1000 MG tablet, Take 1 tablet (1,000 mg total) by mouth daily with breakfast., Disp: 90 tablet, Rfl: 3   Multiple Vitamins-Minerals (CENTRUM SILVER 50+MEN PO), Take 1 tablet by mouth every morning. , Disp: , Rfl:    pantoprazole (PROTONIX) 40 MG tablet, Take 1 tablet (40 mg total) by mouth daily., Disp: 90 tablet, Rfl: 3   chlorhexidine (PERIDEX) 0.12 % solution, Use as directed in the mouth or throat as needed., Disp: , Rfl:   EXAM:  VITALS per patient if applicable:  GENERAL: alert, oriented, appears well and in no acute  distress  PSYCH/NEURO: pleasant and cooperative, no obvious depression or anxiety, speech and thought processing grossly intact  ASSESSMENT AND PLAN:  Discussed the following assessment and plan:  Nausea and vomiting, unspecified vomiting type  Diarrhea, unspecified type Prn zofran odt and tried immodium and helped  Disc otc peptobismol  Call back if does not resolve  Rec do covid test Brat diet and hydration   -we discussed possible serious and likely etiologies, options for evaluation and workup, limitations of telemedicine visit vs in person visit, treatment, treatment risks and precautions. Pt is agreeable to treatment via telemedicine at this moment.    I discussed the assessment and treatment plan with the patient. The patient was provided an opportunity to ask questions and all were answered. The patient agreed with the plan and demonstrated an understanding of the instructions.    Time 10 minutes  Nino Glow McLean-Scocuzza, MD

## 2021-06-24 DIAGNOSIS — K1329 Other disturbances of oral epithelium, including tongue: Secondary | ICD-10-CM | POA: Diagnosis not present

## 2021-06-24 DIAGNOSIS — C01 Malignant neoplasm of base of tongue: Secondary | ICD-10-CM | POA: Diagnosis not present

## 2021-06-24 DIAGNOSIS — Z6833 Body mass index (BMI) 33.0-33.9, adult: Secondary | ICD-10-CM | POA: Diagnosis not present

## 2021-06-24 DIAGNOSIS — C029 Malignant neoplasm of tongue, unspecified: Secondary | ICD-10-CM | POA: Diagnosis not present

## 2021-06-24 DIAGNOSIS — K1379 Other lesions of oral mucosa: Secondary | ICD-10-CM | POA: Diagnosis not present

## 2021-07-27 ENCOUNTER — Encounter: Payer: Self-pay | Admitting: Internal Medicine

## 2021-07-27 ENCOUNTER — Ambulatory Visit (INDEPENDENT_AMBULATORY_CARE_PROVIDER_SITE_OTHER): Payer: Medicare Other | Admitting: Internal Medicine

## 2021-07-27 VITALS — BP 126/60 | HR 55 | Temp 98.7°F | Resp 16 | Ht 70.0 in | Wt 241.0 lb

## 2021-07-27 DIAGNOSIS — K219 Gastro-esophageal reflux disease without esophagitis: Secondary | ICD-10-CM

## 2021-07-27 DIAGNOSIS — Z85819 Personal history of malignant neoplasm of unspecified site of lip, oral cavity, and pharynx: Secondary | ICD-10-CM

## 2021-07-27 DIAGNOSIS — Z9989 Dependence on other enabling machines and devices: Secondary | ICD-10-CM

## 2021-07-27 DIAGNOSIS — G4733 Obstructive sleep apnea (adult) (pediatric): Secondary | ICD-10-CM | POA: Diagnosis not present

## 2021-07-27 NOTE — Progress Notes (Signed)
[facility] ?[address] ?[place], [place] [address] ? ?Pulmonary Sleep Medicine  ? ?Office Visit Note ? ?Patient Name: Tony Lawrence ?DOB: [birth year] ?MRN [id number] ? ?Date of Service: 07/27/2021 ? ?Complaints/HPI: OSA on CPAP. States he got his new machine and has been doing well with it. Patient gets 6-7 hours of sleep. He is tolerating the lower pressure on the CPAP. 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 Has been following with oncology. He is having no hemoptysis noted. Denies having chest pain at this time ? ?ROS ? ?General: (-) fever, (-) chills, (-) night sweats, (-) weakness ?Skin: (-) rashes, (-) itching,. ?Eyes: (-) visual changes, (-) redness, (-) itching. ?Nose and Sinuses: (-) nasal stuffiness or itchiness, (-) postnasal drip, (-) nosebleeds, (-) sinus trouble. ?Mouth and Throat: (-) sore throat, (-) hoarseness. ?Neck: (-) swollen glands, (-) enlarged thyroid, (-) neck pain. ?Respiratory: - cough, (-) bloody sputum, - shortness of breath, - wheezing. ?Cardiovascular: - ankle swelling, (-) chest pain. ?Lymphatic: (-) lymph node enlargement. ?Neurologic: (-) numbness, (-) tingling. ?Psychiatric: (-) anxiety, (-) depression ? ? ?Current Medication: ?Outpatient Encounter Medications as of 07/27/2021  ?Medication Sig  ? Acetaminophen (TYLENOL PO) Take 1,300 mg by mouth every other day.  ? atorvastatin (LIPITOR) 20 MG tablet Take 1 tablet (20 mg total) by mouth daily at 6 PM. At night  ? bisoprolol-hydrochlorothiazide (ZIAC) 2.5-6.25 MG tablet Take 1 tablet by mouth daily.  ? chlorhexidine (PERIDEX) 0.12 % solution Use as directed in the mouth or throat as needed.  ? clopidogrel (PLAVIX) 75 MG tablet TAKE ONE TABLET BY MOUTH DAILY  ? empagliflozin (JARDIANCE) 25 MG TABS tablet Take 1 tablet (25 mg total) by mouth daily.  ? glimepiride (AMARYL) 1 MG tablet Take 1 tablet (1 mg total) by mouth daily with breakfast.  ? glucose blood test strip E 11.9 qd Once Daily one touch  ? isosorbide mononitrate (IMDUR)  30 MG 24 hr tablet TAKE ONE TABLET BY MOUTH EVERY MORNING AND TAKE TWO TABLETS BY MOUTH EVERY EVENING  ? Lancets (ONETOUCH ULTRASOFT) lancets Qd E 11.9 Once daily  ? levothyroxine (SYNTHROID) 200 MCG tablet Take 1 tablet (200 mcg total) by mouth daily. In am on empty stomach  ? liothyronine (CYTOMEL) 5 MCG tablet Take 0.5 tablets (2.5 mcg total) by mouth every other day.  ? losartan (COZAAR) 25 MG tablet Take 1 tablet (25 mg total) by mouth daily. Stop ramipril 20 mg qd  ? metFORMIN (GLUCOPHAGE) 1000 MG tablet Take 1 tablet (1,000 mg total) by mouth daily with breakfast.  ? Multiple Vitamins-Minerals (CENTRUM SILVER 50+MEN PO) Take 1 tablet by mouth every morning.   ? pantoprazole (PROTONIX) 40 MG tablet Take 1 tablet (40 mg total) by mouth daily.  ? ?No facility-administered encounter medications on file as of 07/27/2021.  ? ? ?Surgical History: ?Past Surgical History:  ?Procedure Laterality Date  ? BIOPSY TONGUE    ? 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 cancer of tongue  08/2020  ? removal of cancer  ? CARDIAC CATHETERIZATION N/A 11/10/2015  ? Procedure: Left Heart Cath and Coronary Angiography;  Surgeon: Wellington Hampshire, MD;  Location: [facility];  Service: Cardiovascular;  Laterality: N/A;  ? COLONOSCOPY  05/2006  ? COLONOSCOPY WITH PROPOFOL N/A 05/24/2017  ? Procedure: COLONOSCOPY WITH PROPOFOL;  Surgeon: Lollie Sails, MD;  Location: [facility];  Service: Endoscopy;  Laterality: N/A;  ? EXCISION OF TONGUE LESION N/A 11/11/2015  ? Procedure: EXCISION OF TONGUE LESION/ WITH FROZEN  SECTION;  Surgeon: Beverly Gust, MD;  Location: [facility] OR[facility];  Service: ENT;  Laterality: N/A;  ? REPLACEMENT TOTAL KNEE Left   ? REPLACEMENT TOTAL KNEE Right 04/2020  ? [place] [facility] [place]  ? right tka minimally invasive 05/06/20 Dr. Armandina Gemma    ? ? ?Medical History: ?Past Medical History:  ?Diagnosis Date  ? Actinic keratoses   ? Allergy   ? Arthritis   ? low back pain s/p shots and blocks, knee pain  ? BCC (basal  cell carcinoma of skin)   ? forehead and chest Dr. Evorn Gong q 6 months   ? CAD (coronary artery disease)   ? Complication of anesthesia   ? SEVERE SORE THROAT AFTER BIOPSY 2010  ? Coronary artery disease   ? Chronic total occlusion of mid LAD  ? COVID-19   ? 11/17/20  ? Diabetes mellitus without complication ([place])   ? GERD (gastroesophageal reflux disease)   ? History of chicken pox   ? History of shingles   ? Hyperlipidemia   ? Hypertension   ? Hypothyroidism   ? OSA on CPAP   ? 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 Ihor Austin excised 08/25/20 surgical exc [facility] ?recurrent vs new primary   ? 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 Tongue cancer ([place])   ? 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 Tami Ribas, [facility] H/[facility]   ? ? ?Family History: ?Family History  ?Problem Relation Age of Onset  ? Myelodysplastic syndrome Mother   ? Arthritis Mother   ? Emphysema Father   ? Alcohol abuse Father   ? COPD Father   ? Depression Father   ? Early death Maternal Grandfather   ? Heart disease Maternal Grandfather   ? Stroke Maternal Grandfather   ? ? ?Social History: ?Social History  ? ?Socioeconomic History  ? Marital status: Married  ?  Spouse name: Not on file  ? Number of children: Not on file  ? Years of education: Not on file  ? Highest education level: Not on file  ?Occupational History  ? Not on file  ?Tobacco Use  ? Smoking status: Never  ? Smokeless tobacco: Never  ?Vaping Use  ? Vaping Use: Never used  ?Substance and Sexual Activity  ? Alcohol use: Never  ?  Alcohol/week: 0.0 standard drinks  ? Drug use: No  ? Sexual activity: Yes  ?Other Topics Concern  ? Not on file  ?Social History Narrative  ? Married   ? 3 sons   ? 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 Owns guns, wears seat belt, safe in relationship  ? ?Social Determinants of Health  ? ?Financial Resource Strain: Low Risk   ? Difficulty of Paying Living Expenses: Not hard at all   ?Food Insecurity: No Food Insecurity  ? Worried About [employer] fundraiser in the Last Year: Never true  ? Ran Out of Food in the Last Year: Never true  ?Transportation Needs: No Transportation Needs  ? Lack of Transportation (Medical): No  ? Lack of Transportation (Non-Medical): No  ?Physical Activity: Not on file  ?Stress: No Stress Concern Present  ? Feeling of Stress : Not at all  ?Social Connections: Unknown  ? Frequency of Communication with Friends and Family: More than three times a week  ? Frequency of Social Gatherings with Friends and Family: Not on file  ?  Attends Religious Services: More than 4 times per year  ? Active Member of Clubs or Organizations: Yes  ? Attends Archivist Meetings: Not on file  ? Marital Status: Not on file  ?Intimate Partner Violence: Not At Risk  ? Fear of Current or Ex-Partner: No  ? Emotionally Abused: No  ? Physically Abused: No  ? Sexually Abused: No  ? ? ?Vital Signs: ?Blood pressure 126/60, pulse (!) 55, temperature 98.7 ?F (37.1 ?C), resp. rate 16, height '5\' 10"'$  (1.778 m), weight 241 lb (109.3 kg), SpO2 98 %. ? ?Examination: ?General Appearance: The patient is well-developed, well-nourished, and in no distress. ?Skin: Gross inspection of skin unremarkable. ?Head: normocephalic, no gross deformities. ?Eyes: no gross deformities noted. ?ENT: ears appear grossly normal no exudates. ?Neck: Supple. No thyromegaly. No LAD. ?Respiratory: no rhonchi noted. ?Cardiovascular: Normal S1 and S2 without murmur or rub. ?Extremities: No cyanosis. pulses are equal. ?Neurologic: Alert and oriented. No involuntary movements. ? ?LABS: ?No results found for this or any previous visit (from the past 2160 hour(s)). ? ?Radiology: ?US Venous Img Lower Unilateral Right ? ?Result Date: 06/10/2020 ?CLINICAL DATA:  76 year old male with right lower extremity swelling after total knee replacement on 05/06/2020. EXAM: RIGHT LOWER EXTREMITY VENOUS DOPPLER ULTRASOUND TECHNIQUE: Gray-scale  sonography with graded compression, as well as color Doppler and duplex ultrasound were performed to evaluate the right lower extremity deep venous systems from the level of the common femoral vein and including

## 2021-07-27 NOTE — Patient Instructions (Signed)
Sleep Apnea ?Sleep apnea affects breathing during sleep. It causes breathing to stop for 10 seconds or more, or to become shallow. People with sleep apnea usually snore loudly. ?It can also increase the risk of: ?Heart attack. ?Stroke. ?Being very overweight (obese). ?Diabetes. ?Heart failure. ?Irregular heartbeat. ?High blood pressure. ?The goal of treatment is to help you breathe normally again. ?What are the causes? ?The most common cause of this condition is a collapsed or blocked airway. ?There are three kinds of sleep apnea: ?Obstructive sleep apnea. This is caused by a blocked or collapsed airway. ?Central sleep apnea. This happens when the brain does not send the right signals to the muscles that control breathing. ?Mixed sleep apnea. This is a combination of obstructive and central sleep apnea. ?What increases the risk? ?Being overweight. ?Smoking. ?Having a small airway. ?Being older. ?Being male. ?Drinking alcohol. ?Taking medicines to calm yourself (sedatives or tranquilizers). ?Having family members with the condition. ?Having a tongue or tonsils that are larger than normal. ?What are the signs or symptoms? ?Trouble staying asleep. ?Loud snoring. ?Headaches in the morning. ?Waking up gasping. ?Dry mouth or sore throat in the morning. ?Being sleepy or tired during the day. ?If you are sleepy or tired during the day, you may also: ?Not be able to focus your mind (concentrate). ?Forget things. ?Get angry a lot and have mood swings. ?Feel sad (depressed). ?Have changes in your personality. ?Have less interest in sex, if you are male. ?Be unable to have an erection, if you are male. ?How is this treated? ? ?Sleeping on your side. ?Using a medicine to get rid of mucus in your nose (decongestant). ?Avoiding the use of alcohol, medicines to help you relax, or certain pain medicines (narcotics). ?Losing weight, if needed. ?Changing your diet. ?Quitting smoking. ?Using a machine to open your airway while you  sleep, such as: ?An oral appliance. This is a mouthpiece that shifts your lower jaw forward. ?A CPAP device. This device blows air through a mask when you breathe out (exhale). ?An EPAP device. This has valves that you put in each nostril. ?A BIPAP device. This device blows air through a mask when you breathe in (inhale) and breathe out. ?Having surgery if other treatments do not work. ?Follow these instructions at home: ?Lifestyle ?Make changes that your doctor recommends. ?Eat a healthy diet. ?Lose weight if needed. ?Avoid alcohol, medicines to help you relax, and some pain medicines. ?Do not smoke or use any products that contain nicotine or tobacco. If you need help quitting, ask your doctor. ?General instructions ?Take over-the-counter and prescription medicines only as told by your doctor. ?If you were given a machine to use while you sleep, use it only as told by your doctor. ?If you are having surgery, make sure to tell your doctor you have sleep apnea. You may need to bring your device with you. ?Keep all follow-up visits. ?Contact a doctor if: ?The machine that you were given to use during sleep bothers you or does not seem to be working. ?You do not get better. ?You get worse. ?Get help right away if: ?Your chest hurts. ?You have trouble breathing in enough air. ?You have an uncomfortable feeling in your back, arms, or stomach. ?You have trouble talking. ?One side of your body feels weak. ?A part of your face is hanging down. ?These symptoms may be an emergency. Get help right away. Call your local emergency services (911 in the [place] ?Do not wait to see if the symptoms   will go away. ?Do not drive yourself to the hospital. ?Summary ?This condition affects breathing during sleep. ?The most common cause is a collapsed or blocked airway. ?The goal of treatment is to help you breathe normally while you sleep. ?This information is not intended to replace advice given to you by your health care provider. Make  sure you discuss any questions you have with your health care provider. ?Document Revised: 11/19/2020 Document Reviewed: 03/21/2020 ?Elsevier Patient Education ? 2022 [employer] ? ?

## 2021-07-29 ENCOUNTER — Ambulatory Visit (INDEPENDENT_AMBULATORY_CARE_PROVIDER_SITE_OTHER): Payer: Medicare Other

## 2021-07-29 DIAGNOSIS — G4733 Obstructive sleep apnea (adult) (pediatric): Secondary | ICD-10-CM | POA: Diagnosis not present

## 2021-07-29 NOTE — Progress Notes (Signed)
95 percentile pressure 6 ? ? 95th percentile leak 13.0 ? ? apnea index 0.6 /hr ? apnea-hypopnea index  8.2 /hr ? ? total days used  >4 hr 88 days ? total days used <4 hr 2 days ? ?Total compliance 98 percent ? ?Pt went in and changed his own pressure and his AHI increased he stated he would change back to try 7 cmh2o  ?Pt was seen by Claiborne Billings  RRT/RCP  from [facility]  ?

## 2021-08-03 DIAGNOSIS — C029 Malignant neoplasm of tongue, unspecified: Secondary | ICD-10-CM | POA: Diagnosis not present

## 2021-08-03 DIAGNOSIS — E039 Hypothyroidism, unspecified: Secondary | ICD-10-CM | POA: Diagnosis not present

## 2021-08-03 DIAGNOSIS — E785 Hyperlipidemia, unspecified: Secondary | ICD-10-CM | POA: Diagnosis not present

## 2021-08-03 DIAGNOSIS — I10 Essential (primary) hypertension: Secondary | ICD-10-CM | POA: Diagnosis not present

## 2021-08-03 DIAGNOSIS — Z79899 Other long term (current) drug therapy: Secondary | ICD-10-CM | POA: Diagnosis not present

## 2021-08-03 DIAGNOSIS — K1379 Other lesions of oral mucosa: Secondary | ICD-10-CM | POA: Diagnosis not present

## 2021-08-03 DIAGNOSIS — E119 Type 2 diabetes mellitus without complications: Secondary | ICD-10-CM | POA: Diagnosis not present

## 2021-08-03 DIAGNOSIS — G4730 Sleep apnea, unspecified: Secondary | ICD-10-CM | POA: Diagnosis not present

## 2021-08-12 DIAGNOSIS — B370 Candidal stomatitis: Secondary | ICD-10-CM | POA: Diagnosis not present

## 2021-08-12 DIAGNOSIS — C01 Malignant neoplasm of base of tongue: Secondary | ICD-10-CM | POA: Diagnosis not present

## 2021-08-12 DIAGNOSIS — C029 Malignant neoplasm of tongue, unspecified: Secondary | ICD-10-CM | POA: Diagnosis not present

## 2021-09-18 DIAGNOSIS — Z09 Encounter for follow-up examination after completed treatment for conditions other than malignant neoplasm: Secondary | ICD-10-CM | POA: Diagnosis not present

## 2021-09-18 DIAGNOSIS — C029 Malignant neoplasm of tongue, unspecified: Secondary | ICD-10-CM | POA: Diagnosis not present

## 2021-09-23 DIAGNOSIS — L570 Actinic keratosis: Secondary | ICD-10-CM | POA: Diagnosis not present

## 2021-09-23 DIAGNOSIS — L820 Inflamed seborrheic keratosis: Secondary | ICD-10-CM | POA: Diagnosis not present

## 2021-09-23 DIAGNOSIS — D2261 Melanocytic nevi of right upper limb, including shoulder: Secondary | ICD-10-CM | POA: Diagnosis not present

## 2021-09-23 DIAGNOSIS — R208 Other disturbances of skin sensation: Secondary | ICD-10-CM | POA: Diagnosis not present

## 2021-09-23 DIAGNOSIS — Z85828 Personal history of other malignant neoplasm of skin: Secondary | ICD-10-CM | POA: Diagnosis not present

## 2021-09-23 DIAGNOSIS — D485 Neoplasm of uncertain behavior of skin: Secondary | ICD-10-CM | POA: Diagnosis not present

## 2021-09-23 DIAGNOSIS — L538 Other specified erythematous conditions: Secondary | ICD-10-CM | POA: Diagnosis not present

## 2021-09-23 DIAGNOSIS — D2262 Melanocytic nevi of left upper limb, including shoulder: Secondary | ICD-10-CM | POA: Diagnosis not present

## 2021-09-23 DIAGNOSIS — D225 Melanocytic nevi of trunk: Secondary | ICD-10-CM | POA: Diagnosis not present

## 2021-09-23 DIAGNOSIS — C44719 Basal cell carcinoma of skin of left lower limb, including hip: Secondary | ICD-10-CM | POA: Diagnosis not present

## 2021-09-23 DIAGNOSIS — X32XXXA Exposure to sunlight, initial encounter: Secondary | ICD-10-CM | POA: Diagnosis not present

## 2021-09-25 ENCOUNTER — Encounter: Payer: Self-pay | Admitting: Internal Medicine

## 2021-09-25 ENCOUNTER — Ambulatory Visit (INDEPENDENT_AMBULATORY_CARE_PROVIDER_SITE_OTHER): Payer: Medicare Other | Admitting: Internal Medicine

## 2021-09-25 VITALS — BP 130/60 | HR 60 | Temp 98.2°F | Resp 12 | Ht 70.0 in | Wt 242.4 lb

## 2021-09-25 DIAGNOSIS — E119 Type 2 diabetes mellitus without complications: Secondary | ICD-10-CM

## 2021-09-25 DIAGNOSIS — E1159 Type 2 diabetes mellitus with other circulatory complications: Secondary | ICD-10-CM | POA: Diagnosis not present

## 2021-09-25 DIAGNOSIS — R319 Hematuria, unspecified: Secondary | ICD-10-CM

## 2021-09-25 DIAGNOSIS — I2510 Atherosclerotic heart disease of native coronary artery without angina pectoris: Secondary | ICD-10-CM

## 2021-09-25 DIAGNOSIS — K219 Gastro-esophageal reflux disease without esophagitis: Secondary | ICD-10-CM

## 2021-09-25 DIAGNOSIS — E1169 Type 2 diabetes mellitus with other specified complication: Secondary | ICD-10-CM

## 2021-09-25 DIAGNOSIS — I152 Hypertension secondary to endocrine disorders: Secondary | ICD-10-CM

## 2021-09-25 DIAGNOSIS — E039 Hypothyroidism, unspecified: Secondary | ICD-10-CM

## 2021-09-25 DIAGNOSIS — I10 Essential (primary) hypertension: Secondary | ICD-10-CM

## 2021-09-25 DIAGNOSIS — E785 Hyperlipidemia, unspecified: Secondary | ICD-10-CM | POA: Diagnosis not present

## 2021-09-25 MED ORDER — ATORVASTATIN CALCIUM 20 MG PO TABS: 20.0000 mg | ORAL_TABLET | Freq: Every day | ORAL | 3 refills | Status: DC

## 2021-09-25 MED ORDER — GLUCOSE BLOOD VI STRP: ORAL_STRIP | 12 refills | Status: AC

## 2021-09-25 MED ORDER — GLIMEPIRIDE 1 MG PO TABS: 1.0000 mg | ORAL_TABLET | Freq: Every day | ORAL | 3 refills | Status: DC

## 2021-09-25 MED ORDER — PANTOPRAZOLE SODIUM 40 MG PO TBEC: 40.0000 mg | DELAYED_RELEASE_TABLET | Freq: Every day | ORAL | 3 refills | Status: DC

## 2021-09-25 MED ORDER — LOSARTAN POTASSIUM 25 MG PO TABS: 25.0000 mg | ORAL_TABLET | Freq: Every day | ORAL | 3 refills | Status: DC

## 2021-09-25 MED ORDER — EMPAGLIFLOZIN 25 MG PO TABS: 25.0000 mg | ORAL_TABLET | Freq: Every day | ORAL | 3 refills | Status: DC

## 2021-09-25 MED ORDER — LEVOTHYROXINE SODIUM 200 MCG PO TABS: 200.0000 ug | ORAL_TABLET | Freq: Every day | ORAL | 3 refills | Status: DC

## 2021-09-25 MED ORDER — BISOPROLOL-HYDROCHLOROTHIAZIDE 2.5-6.25 MG PO TABS: 1.0000 | ORAL_TABLET | Freq: Every day | ORAL | 3 refills | Status: DC

## 2021-09-25 MED ORDER — LIOTHYRONINE SODIUM 5 MCG PO TABS: 2.5000 ug | ORAL_TABLET | ORAL | 3 refills | Status: DC

## 2021-09-25 MED ORDER — METFORMIN HCL 1000 MG PO TABS: 1000.0000 mg | ORAL_TABLET | Freq: Every day | ORAL | 3 refills | Status: DC

## 2021-09-25 MED ORDER — ONETOUCH ULTRASOFT LANCETS MISC: 12 refills | Status: DC

## 2021-09-25 NOTE — Progress Notes (Signed)
Chief Complaint  Patient presents with   Follow-up    3 mon, denies any concerns or pain   3 month f/u  1. 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 Dm2 with htn controlled on ziac 2.5-6.25, jardiance 25 mg qd but issues with balanitis/yeast in private area, amaryl 1 mg qd and metformin 1000 mg qd though cut dose still having diarrhea  Disc rybelsus today pt is interested and given sample 3 mg qd x 30 days will call back with sugar readings 3. Hypothyroidism check labs on levo 200 mg qd and cytomel 2.5 QOD    Review of Systems  Constitutional:  Negative for weight loss.  HENT:  Negative for hearing loss.   Eyes:  Negative for blurred vision.  Respiratory:  Negative for shortness of breath.   Cardiovascular:  Negative for chest pain.  Gastrointestinal:  Negative for abdominal pain and blood in stool.  Musculoskeletal:  Negative for back pain.  Skin:  Negative for rash.  Neurological:  Negative for headaches.  Psychiatric/Behavioral:  Negative for depression.    Past Medical History:  Diagnosis Date   Actinic keratoses    Allergy    Arthritis    low back pain s/p shots and blocks, knee pain   BCC (basal cell carcinoma of skin)    forehead and chest Dr. 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 Tami Ribas, [facility] H/[facility]    Past Surgical History:  Procedure Laterality Date   BIOPSY TONGUE     11/11/15 SCC tongue    cancer of tongue  08/2020   removal of cancer   CARDIAC CATHETERIZATION N/A 11/10/2015   Procedure: Left Heart Cath and Coronary Angiography;  Surgeon: Wellington Hampshire, MD;  Location: [facility];  Service: Cardiovascular;  Laterality: N/A;   COLONOSCOPY  05/2006   COLONOSCOPY WITH PROPOFOL N/A 05/24/2017   Procedure: COLONOSCOPY WITH PROPOFOL;  Surgeon: Lollie Sails, MD;  Location[facility] ENDOSCOPY;  Service: Endoscopy;  Laterality: N/A;   EXCISION OF TONGUE LESION N/A 11/11/2015   Procedure: EXCISION OF TONGUE LESION/ WITH FROZEN SECTION;  Surgeon: Beverly Gust, MD;  Location: [facility] ORS;  Service: ENT;  Laterality: N/A;   REPLACEMENT TOTAL KNEE Left    REPLACEMENT TOTAL KNEE Right 04/2020   [place] [facility] [place] [facility]   right tka minimally invasive 05/06/20 Dr. Armandina Gemma     Family History  Problem Relation Age of Onset   Myelodysplastic syndrome Mother    Arthritis Mother  Emphysema Father    Alcohol abuse Father    COPD Father    Depression Father    Early death Maternal Grandfather    Heart disease Maternal Grandfather    Stroke Maternal Grandfather    Social History   Socioeconomic History   Marital status: Married    Spouse name: Not on file   Number of children: Not on file   Years of education: Not on file   Highest education level: Not on file  Occupational History   Not on file  Tobacco Use   Smoking status: Never   Smokeless tobacco: Never  Vaping Use   Vaping Use: Never used  Substance and Sexual Activity   Alcohol use: Never    Alcohol/week: 0.0 standard drinks of alcohol   Drug use: No   Sexual activity: Yes  Other Topics Concern   Not on file  Social History Narrative   Married    3 sons    Forensic psychologist,  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    Owns guns, wears seat belt, safe in relationship   Social Determinants of Health   Financial Resource Strain: [place]  (02/20/2021)   Overall Financial Resource Strain (CARDIA)    Difficulty of Paying Living Expenses: Not hard at all  Food Insecurity: No Food Insecurity (02/20/2021)   Hunger Vital Sign    Worried About Running Out of Food in the Last Year: Never true    [place] in the Last Year: Never true  Transportation Needs: No Transportation Needs (02/20/2021)   PRAPARE - Hydrologist (Medical): No    Lack of Transportation (Non-Medical): No  Physical Activity: Not on file  Stress: No Stress Concern Present (02/20/2021)   [place]    Feeling of Stress : Not at all  Social Connections: Unknown (02/20/2021)   Social Connection and Isolation Panel [NHANES]    Frequency of Communication with Friends and Family: More than three times a week    Frequency of Social Gatherings with Friends and Family: Not on file    Attends Religious Services: More than 4 times per year    Active Member of [employer] or Organizations: Yes    Attends Archivist Meetings: Not on file    Marital Status: Not on file  Intimate Partner Violence: Not At Risk (02/20/2021)   Humiliation, Afraid, Rape, and Kick questionnaire    Fear of Current or Ex-Partner: No    Emotionally Abused: No    Physically Abused: No    Sexually Abused: No   Current Meds  Medication Sig   Acetaminophen (TYLENOL PO) Take 1,300 mg by mouth every other day.   chlorhexidine (PERIDEX) 0.12 % solution Use as directed in the mouth or throat as needed.   clopidogrel (PLAVIX) 75 MG tablet TAKE ONE TABLET BY MOUTH DAILY   isosorbide mononitrate (IMDUR) 30 MG 24 hr tablet TAKE ONE TABLET BY MOUTH EVERY MORNING AND TAKE TWO TABLETS BY MOUTH EVERY EVENING   Multiple Vitamins-Minerals  (CENTRUM SILVER 50+MEN PO) Take 1 tablet by mouth every morning.    Semaglutide (RYBELSUS) 3 MG TABS Take 3 mg by mouth daily. Medication Samples have been provided to the patient.  Strength 30        Qty: 0  LOT: [id number]  Exp.Date: 08/2022  Dosing instructions:qd  The patient has been instructed regarding the correct time, dose, and frequency of taking this medication,  including desired effects and most common side effects.   Tony Lawrence 1:03 PM 10/01/2021   [DISCONTINUED] atorvastatin (LIPITOR) 20 MG tablet Take 1 tablet (20 mg total) by mouth daily at 6 PM. At night   [DISCONTINUED] bisoprolol-hydrochlorothiazide (ZIAC) 2.5-6.25 MG tablet Take 1 tablet by mouth daily.   [DISCONTINUED] empagliflozin (JARDIANCE) 25 MG TABS tablet Take 1 tablet (25 mg total) by mouth daily.   [DISCONTINUED] glimepiride (AMARYL) 1 MG tablet Take 1 tablet (1 mg total) by mouth daily with breakfast.   [DISCONTINUED] glucose blood test strip E 11.9 qd Once Daily one touch   [DISCONTINUED] Lancets (ONETOUCH ULTRASOFT) lancets Qd E 11.9 Once daily   [DISCONTINUED] levothyroxine (SYNTHROID) 200 MCG tablet Take 1 tablet (200 mcg total) by mouth daily. In am on empty stomach   [DISCONTINUED] liothyronine (CYTOMEL) 5 MCG tablet Take 0.5 tablets (2.5 mcg total) by mouth every other day.   [DISCONTINUED] losartan (COZAAR) 25 MG tablet Take 1 tablet (25 mg total) by mouth daily. Stop ramipril 20 mg qd   [DISCONTINUED] metFORMIN (GLUCOPHAGE) 1000 MG tablet Take 1 tablet (1,000 mg total) by mouth daily with breakfast.   [DISCONTINUED] pantoprazole (PROTONIX) 40 MG tablet Take 1 tablet (40 mg total) by mouth daily.   Allergies  Allergen Reactions   Penicillins Hives, Rash, Other (See Comments) and Swelling    Has patient had a PCN reaction causing immediate rash, facial/tongue/throat swelling, SOB or lightheadedness with hypotension: Yes Has patient had a PCN reaction causing severe rash involving mucus  membranes or skin necrosis: No Has patient had a PCN reaction that required hospitalization No Has patient had a PCN reaction occurring within the last 10 years: No If all of the above answers are "NO", then may proceed with Cephalosporin use.    Recent Results (from the past 2160 hour(s))  TSH     Status: None   Collection Time: 09/28/21  9:24 AM  Result Value Ref Range   TSH 0.67 0.35 - 5.50 uIU/mL  Hemoglobin A1c     Status: Abnormal   Collection Time: 09/28/21  9:24 AM  Result Value Ref Range   Hgb A1c MFr Bld 6.8 (H) 4.6 - 6.5 %    Comment: Glycemic Control Guidelines for People with Diabetes:Non Diabetic:  <6%Goal of Therapy: <7%Additional Action Suggested:  >8%   CBC with Differential/Platelet     Status: Abnormal   Collection Time: 09/28/21  9:24 AM  Result Value Ref Range   WBC 7.9 4.0 - 10.5 K/uL   RBC 5.22 4.22 - 5.81 Mil/uL   Hemoglobin 15.3 13.0 - 17.0 g/dL   HCT 46.3 39.0 - 52.0 %   MCV 88.7 78.0 - 100.0 fl   MCHC 33.1 30.0 - 36.0 g/dL   RDW 16.3 (H) 11.5 - 15.5 %   Platelets 147.0 (L) 150.0 - 400.0 K/uL   Neutrophils Relative % 64.2 43.0 - 77.0 %   Lymphocytes Relative 24.8 12.0 - 46.0 %   Monocytes Relative 9.3 3.0 - 12.0 %   Eosinophils Relative 1.3 0.0 - 5.0 %   Basophils Relative 0.4 0.0 - 3.0 %   Neutro Abs 5.1 1.4 - 7.7 K/uL   Lymphs Abs 2.0 0.7 - 4.0 K/uL   Monocytes Absolute 0.7 0.1 - 1.0 K/uL   Eosinophils Absolute 0.1 0.0 - 0.7 K/uL   Basophils Absolute 0.0 0.0 - 0.1 K/uL  Lipid panel     Status: Abnormal   Collection Time: 09/28/21  9:24 AM  Result Value Ref  Range   Cholesterol 85 0 - 200 mg/dL    Comment: ATP III Classification       Desirable:  < 200 mg/dL               Borderline High:  200 - 239 mg/dL          High:  > = 240 mg/dL   Triglycerides 144.0 0.0 - 149.0 mg/dL    Comment: Normal:  <150 mg/dLBorderline High:  150 - 199 mg/dL   HDL 28.70 (L) >39.00 mg/dL   VLDL 28.8 0.0 - 40.0 mg/dL   LDL Cholesterol 28 0 - 99 mg/dL   Total CHOL/HDL  Ratio 3     Comment:                Men          Women1/2 Average Risk     3.4          3.3Average Risk          5.0          4.42X Average Risk          9.6          7.13X Average Risk          15.0          11.0                       NonHDL 56.68     Comment: NOTE:  Non-HDL goal should be 30 mg/dL higher than patient's LDL goal (i.e. LDL goal of < 70 mg/dL, would have non-HDL goal of < 100 mg/dL)  Comprehensive metabolic panel     Status: None   Collection Time: 09/28/21  9:24 AM  Result Value Ref Range   Sodium 139 135 - 145 mEq/L   Potassium 4.1 3.5 - 5.1 mEq/L   Chloride 102 96 - 112 mEq/L   CO2 27 19 - 32 mEq/L   Glucose, Bld 93 70 - 99 mg/dL   BUN 13 6 - 23 mg/dL   Creatinine, Ser 1.05 0.40 - 1.50 mg/dL   Total Bilirubin 0.8 0.2 - 1.2 mg/dL   Alkaline Phosphatase 109 39 - 117 U/L   AST 23 0 - 37 U/L   ALT 22 0 - 53 U/L   Total Protein 7.0 6.0 - 8.3 g/dL   Albumin 4.1 3.5 - 5.2 g/dL   GFR 69.03 >60.00 mL/min    Comment: Calculated using the CKD-EPI Creatinine Equation (2021)   Calcium 8.8 8.4 - 10.5 mg/dL   Objective  Body mass index is 34.78 kg/m. Wt Readings from Last 3 Encounters:  09/25/21 242 lb 6.4 oz (110 kg)  07/27/21 241 lb (109.3 kg)  06/03/21 235 lb 3.2 oz (106.7 kg)   Temp Readings from Last 3 Encounters:  09/25/21 98.2 F (36.8 C) (Oral)  07/27/21 98.7 F (37.1 C)  02/25/21 (!) 97.5 F (36.4 C) (Oral)   BP Readings from Last 3 Encounters:  09/25/21 130/60  07/27/21 126/60  03/23/21 120/62   Pulse Readings from Last 3 Encounters:  09/25/21 60  07/27/21 (!) 55  03/23/21 67    Physical Exam Vitals and nursing note reviewed.  Constitutional:      Appearance: Normal appearance. He is well-developed and well-groomed.  HENT:     Head: Normocephalic and atraumatic.  Eyes:     Conjunctiva/sclera: Conjunctivae normal.     Pupils: Pupils are equal, round, and reactive  to light.  Cardiovascular:     Rate and Rhythm: Normal rate and regular rhythm.      Heart sounds: Normal heart sounds.  Pulmonary:     Effort: Pulmonary effort is normal. No respiratory distress.     Breath sounds: Normal breath sounds.  Abdominal:     Tenderness: There is no abdominal tenderness.  Skin:    General: Skin is warm and moist.  Neurological:     General: No focal deficit present.     Mental Status: He is alert and oriented to person, place, and time. Mental status is at baseline.     Sensory: Sensation is intact.     Motor: Motor function is intact.     Coordination: Coordination is intact.     Gait: Gait is intact. Gait normal.  Psychiatric:        Attention and Perception: Attention and perception normal.        Mood and Affect: Mood and affect normal.        Speech: Speech normal.        Behavior: Behavior normal. Behavior is cooperative.        Thought Content: Thought content normal.        Cognition and Memory: Cognition and memory normal.        Judgment: Judgment normal.     Assessment  Plan  Type 2 diabetes mellitus without complication, without long-term current use of insulin (HCC) - Plan: Hemoglobin A1c, glimepiride (AMARYL) 1 MG tablet qd , atorvastatin (LIPITOR) 20 MG tablet, glucose blood test strip, Lancets (ONETOUCH ULTRASOFT) lancets, Semaglutide (RYBELSUS) 3 MG TABS, DISCONTINUED: metFORMIN (GLUCOPHAGE) 1000 MG tablet DUE TO DIARRHEA  DISCONTINUED: empagliflozin (JARDIANCE) 25 MG TABS tablet DUE TO YEAST INFECTIONS IN PRIVATE AREA LOG SUGARS AND LET ME KNOW IN 2 WEEKS  IF TOLERATING RYBELUS WILL INCREASE DOSE TO 7 MG QD MAINTENANCE   Essential hypertension CONTROLLED - Plan: Comprehensive metabolic panel, Lipid panel, CBC with Differential/Platelet, Hemoglobin A1c, bisoprolol-hydrochlorothiazide (ZIAC) 2.5-6.25 MG tablet  Hyperlipidemia associated with type 2 diabetes mellitus (HCC) LIPITOR 20 MG QD  Hypothyroidism, unspecified type - Plan: TSH, liothyronine (CYTOMEL) 2.5 MCG tablet QOD, levothyroxine (SYNTHROID) 200  QD  Gastroesophageal reflux disease without esophagitis - Plan: pantoprazole (PROTONIX) 40 MG tablet  Hypertension associated with diabetes (HCC) - Plan: losartan (COZAAR) 25 MG tablet, Semaglutide (RYBELSUS) 3 MG TABS SEE ABOVE  ZIAC 2.5-6.25 MG QD  Coronary artery disease involving native coronary artery of native heart without angina pectoris - Plan: atorvastatin (LIPITOR) 20 MG tablet   HM Flu shot utd prevnar had 02/03/18 Tdap had 08/02/2018  zostervax per pt had in the past  Had 2/2 shigrix  pna 23 utd  covid 19 vx 3/3 consider booster declines h/o cogid 19+   Colonoscopy 05/24/2017 diverticulosis no etiology for diarrhea -repeat in 10 years Dr. Gustavo Lah   PSA 0.90 h/o mild BPH on imaging f/u urology Dr. Bernardo Heater 04/23/20 f/u annually vs prn no further PSA testing rec.    Dr. Sharlet Salina as of 11/14/19 back issues ok   Derm Dr. Evorn Gong h/o [place] forehead and chest saw 9 or 01/2019 ln2 to scalp still unresolved as of 02/28/2019 rec call back for more ln2 before 1 year  Derm 05/2019 ln2 to scalp f/u in 6 months  Residual scalp Aks x 2 f/u dermatology, scc superficial nose tx'ed as of 11/14/19 Fu Q 6 months appt sch 07/28/20    ENT Dr. Bernestine Amass [facility] cancer f/u Q2 months due 11/18/21  Dr. Tami Ribas ENT    [place] [facility] Dr. Merla Riches Dentist Dr. Eugenie Birks Cardiology Dr. Saunders [facility] GI Dr. Gustavo Lah  Dr. Nash Mantis ortho right min. Invasive TKA 05/06/20 in [place]   Rec healthy diet and exercise Provider: Dr. Olivia Mackie Lawrence-Internal Medicine

## 2021-09-25 NOTE — Patient Instructions (Addendum)
Stop jardiance 25 and metformin 1000 mg daily  Try Rybelsus 3 mg x 1 month and Amaryl only  Goal sugar 90 to <140  2 hours after meal blood sugar <180   Semaglutide Tablets What is this medication? SEMAGLUTIDE (SEM a GLOO tide) treats type 2 diabetes. It works by increasing insulin levels in your body, which decreases your blood sugar (glucose). It also reduces the amount of sugar released into the blood and slows down your digestion. Changes to diet and exercise are often combined with this medication. This medicine may be used for other purposes; ask your health care provider or pharmacist if you have questions. COMMON BRAND NAME(S): Rybelsus What should I tell my care team before I take this medication? They need to know if you have any of these conditions: Endocrine tumors (MEN 2) or if someone in your family had these tumors Eye disease History of pancreatitis Kidney disease Stomach or intestine problems Thyroid cancer or if someone in your family had thyroid cancer Vision problems An unusual or allergic reaction to semaglutide, other medications, foods, dyes, or preservatives Pregnant or trying to get pregnant Breast-feeding How should I use this medication? Take this medication by mouth. Take it as directed on the prescription label at the same time every day. Take the dose right after waking up. Do not eat or drink anything before taking it. Do not take it with any other drink except a glass of plain water that is less than 4 ounces (less than 120 mL). Do not cut, crush or chew this medication. Swallow the tablets whole. After taking it, do not eat breakfast, drink, or take any other medications or vitamins for at least 30 minutes. Keep taking it unless your care team tells you to stop. A special MedGuide will be given to you by the pharmacist with each prescription and refill. Be sure to read this information carefully each time. Talk to your care team about the use of this  medication in children. Special care may be needed. Overdosage: If you think you have taken too much of this medicine contact a poison control center or emergency room at once. NOTE: This medicine is only for you. Do not share this medicine with others. What if I miss a dose? If you miss a dose, skip it. Take your next dose at the normal time. Do not take extra or 2 doses at the same time to make up for the missed dose. What may interact with this medication? What may interact with this medication? Aminophylline Carbamazepine Cyclosporine Digoxin Levothyroxine Other medications for diabetes Phenytoin Tacrolimus Theophylline Warfarin Many medications may cause changes in blood sugar, these include: Alcohol containing beverages Antiviral medications for HIV or AIDS Aspirin and aspirin-like medications Certain medications for blood pressure, heart disease, irregular heart beat Chromium Diuretics Male hormones, such as estrogens or progestins, birth control pills Fenofibrate Gemfibrozil Isoniazid Lanreotide Male hormones or anabolic steroids MAOIs like Carbex, Eldepryl, Marplan, Nardil, and Parnate Medications for weight loss Medications for allergies, asthma, cold, or cough Medications for depression, anxiety, or psychotic disturbances Niacin Nicotine NSAIDs, medications for pain and inflammation, like ibuprofen or naproxen Octreotide Pasireotide Pentamidine Phenytoin Probenecid Quinolone antibiotics such as ciprofloxacin, levofloxacin, ofloxacin Some herbal dietary supplements Steroid medications such as prednisone or cortisone Sulfamethoxazole; trimethoprim Thyroid hormones Some medications can hide the warning symptoms of low blood sugar (hypoglycemia). You may need to monitor your blood sugar more closely if you are taking one of these medications. These include: Beta-blockers, often  used for high blood pressure or heart problems (examples include atenolol,  metoprolol, propranolol) Clonidine Guanethidine Reserpine This list may not describe all possible interactions. Give your health care provider a list of all the medicines, herbs, non-prescription drugs, or dietary supplements you use. Also tell them if you smoke, drink alcohol, or use illegal drugs. Some items may interact with your medicine. What should I watch for while using this medication? Visit your care team for regular checks on your progress. Check with your care team if you have severe diarrhea, nausea, and vomiting, or if you sweat a lot. The loss of too much body fluid may make it dangerous for you to take this medication. A test called the HbA1C (A1C) will be monitored. This is a simple blood test. It measures your blood sugar control over the last 2 to 3 months. You will receive this test every 3 to 6 months. Learn how to check your blood sugar. Learn the symptoms of low and high blood sugar and how to manage them. Always carry a quick-source of sugar with you in case you have symptoms of low blood sugar. Examples include hard sugar candy or glucose tablets. Make sure others know that you can choke if you eat or drink when you develop serious symptoms of low blood sugar, such as seizures or unconsciousness. Get medical help at once. Tell your care team if you have high blood sugar. You might need to change the dose of your medication. If you are sick or exercising more than usual, you might need to change the dose of your medication. Do not skip meals. Ask your care team if you should avoid alcohol. Many nonprescription cough and cold products contain sugar or alcohol. These can affect blood sugar. Wear a medical ID bracelet or chain. Carry a card that describes your condition. List the medications and doses you take on the card. Do not become pregnant while taking this medication. Women should inform their care team if they wish to become pregnant or think they might be pregnant. There  is a potential for serious side effects to an unborn child. Talk to your care team for more information. Do not breast-feed an infant while taking this medication. What side effects may I notice from receiving this medication? Side effects that you should report to your care team as soon as possible: Allergic reactions--skin rash, itching, hives, swelling of the face, lips, tongue, or throat Change in vision Dehydration--increased thirst, dry mouth, feeling faint or lightheaded, headache, dark yellow or brown urine Gallbladder problems--severe stomach pain, nausea, vomiting, fever Heart palpitations--rapid, pounding, or irregular heartbeat Kidney injury--decrease in the amount of urine, swelling of the ankles, hands, or feet Pancreatitis--severe stomach pain that spreads to your back or gets worse after eating or when touched, fever, nausea, vomiting Thyroid cancer--new mass or lump in the neck, pain or trouble swallowing, trouble breathing, hoarseness Side effects that usually do not require medical attention (report to your care team if they continue or are bothersome): Diarrhea Loss of appetite Nausea Stomach pain Vomiting This list may not describe all possible side effects. Call your doctor for medical advice about side effects. You may report side effects to FDA at 1-800-FDA-1088. Where should I keep my medication? Keep out of the reach of children and pets. Store at room temperature between 20 and 25 degrees C (68 and 77 degrees F). Keep this medication in the original container. Protect from moisture. Keep the container tightly closed. Get rid of any  unused medication after the expiration date. To get rid of medications that are no longer needed or have expired: Take the medication to a medication take-back program. Check with your pharmacy or law enforcement to find a location. If you cannot return the medication, check the label or package insert to see if the medication should be  thrown out in the garbage or flushed down the toilet. If you are not sure, ask your care team. If it is safe to put it in the trash, take the medication out of the container. Mix the medication with cat litter, dirt, coffee grounds, or other unwanted substance. Seal the mixture in a bag or container. Put it in the trash. NOTE: This sheet is a summary. It may not cover all possible information. If you have questions about this medicine, talk to your doctor, pharmacist, or health care provider.  2023 Elsevier/Gold Standard (2020-08-20 00:00:00)

## 2021-09-28 ENCOUNTER — Other Ambulatory Visit (INDEPENDENT_AMBULATORY_CARE_PROVIDER_SITE_OTHER): Payer: Medicare Other

## 2021-09-28 DIAGNOSIS — E039 Hypothyroidism, unspecified: Secondary | ICD-10-CM

## 2021-09-28 DIAGNOSIS — I10 Essential (primary) hypertension: Secondary | ICD-10-CM

## 2021-09-28 DIAGNOSIS — E119 Type 2 diabetes mellitus without complications: Secondary | ICD-10-CM

## 2021-09-28 LAB — COMPREHENSIVE METABOLIC PANEL
ALT: 22 U/L (ref 0–53)
AST: 23 U/L (ref 0–37)
Albumin: 4.1 g/dL (ref 3.5–5.2)
Alkaline Phosphatase: 109 U/L (ref 39–117)
BUN: 13 mg/dL (ref 6–23)
CO2: 27 mEq/L (ref 19–32)
Calcium: 8.8 mg/dL (ref 8.4–10.5)
Chloride: 102 mEq/L (ref 96–112)
Creatinine, Ser: 1.05 mg/dL (ref 0.40–1.50)
GFR: 69.03 mL/min (ref >60.00)
Glucose, Bld: 93 mg/dL (ref 70–99)
Potassium: 4.1 mEq/L (ref 3.5–5.1)
Sodium: 139 mEq/L (ref 135–145)
Total Bilirubin: 0.8 mg/dL (ref 0.2–1.2)
Total Protein: 7 g/dL (ref 6.0–8.3)

## 2021-09-28 LAB — CBC WITH DIFFERENTIAL/PLATELET
Basophils Absolute: 0 10*3/uL (ref 0.0–0.1)
Basophils Relative: 0.4 % (ref 0.0–3.0)
Eosinophils Absolute: 0.1 10*3/uL (ref 0.0–0.7)
Eosinophils Relative: 1.3 % (ref 0.0–5.0)
HCT: 46.3 % (ref 39.0–52.0)
Hemoglobin: 15.3 g/dL (ref 13.0–17.0)
Lymphocytes Relative: 24.8 % (ref 12.0–46.0)
Lymphs Abs: 2 10*3/uL (ref 0.7–4.0)
MCHC: 33.1 g/dL (ref 30.0–36.0)
MCV: 88.7 fl (ref 78.0–100.0)
Monocytes Absolute: 0.7 10*3/uL (ref 0.1–1.0)
Monocytes Relative: 9.3 % (ref 3.0–12.0)
Neutro Abs: 5.1 10*3/uL (ref 1.4–7.7)
Neutrophils Relative %: 64.2 % (ref 43.0–77.0)
Platelets: 147 10*3/uL — ABNORMAL LOW (ref 150.0–400.0)
RBC: 5.22 Mil/uL (ref 4.22–5.81)
RDW: 16.3 % — ABNORMAL HIGH (ref 11.5–15.5)
WBC: 7.9 10*3/uL (ref 4.0–10.5)

## 2021-09-28 LAB — LIPID PANEL
Cholesterol: 85 mg/dL (ref 0–200)
HDL: 28.7 mg/dL — ABNORMAL LOW (ref >39.00)
LDL Cholesterol: 28 mg/dL (ref 0–99)
NonHDL: 56.68
Total CHOL/HDL Ratio: 3
Triglycerides: 144 mg/dL (ref 0.0–149.0)
VLDL: 28.8 mg/dL (ref 0.0–40.0)

## 2021-09-28 LAB — TSH: TSH: 0.67 u[IU]/mL (ref 0.35–5.50)

## 2021-09-28 LAB — HEMOGLOBIN A1C: Hgb A1c MFr Bld: 6.8 % — ABNORMAL HIGH (ref 4.6–6.5)

## 2021-10-01 MED ORDER — RYBELSUS 3 MG PO TABS: 3.0000 mg | ORAL_TABLET | Freq: Every day | ORAL | 0 refills | Status: DC

## 2021-10-28 ENCOUNTER — Encounter: Payer: Self-pay | Admitting: Internal Medicine

## 2021-11-04 ENCOUNTER — Encounter: Payer: Self-pay | Admitting: Internal Medicine

## 2021-11-16 NOTE — Addendum Note (Signed)
Addended by: Orland Mustard on: 11/16/2021 10:40 PM   Modules accepted: Orders

## 2021-11-18 DIAGNOSIS — Z6833 Body mass index (BMI) 33.0-33.9, adult: Secondary | ICD-10-CM | POA: Diagnosis not present

## 2021-11-18 DIAGNOSIS — Z85810 Personal history of malignant neoplasm of tongue: Secondary | ICD-10-CM | POA: Diagnosis not present

## 2021-11-18 DIAGNOSIS — C01 Malignant neoplasm of base of tongue: Secondary | ICD-10-CM | POA: Diagnosis not present

## 2021-11-18 DIAGNOSIS — K1370 Unspecified lesions of oral mucosa: Secondary | ICD-10-CM | POA: Diagnosis not present

## 2021-11-18 DIAGNOSIS — L439 Lichen planus, unspecified: Secondary | ICD-10-CM | POA: Diagnosis not present

## 2021-12-21 ENCOUNTER — Other Ambulatory Visit: Payer: Self-pay | Admitting: Internal Medicine

## 2021-12-29 ENCOUNTER — Ambulatory Visit (INDEPENDENT_AMBULATORY_CARE_PROVIDER_SITE_OTHER): Payer: Medicare Other | Admitting: Internal Medicine

## 2021-12-29 ENCOUNTER — Encounter: Payer: Self-pay | Admitting: Internal Medicine

## 2021-12-29 VITALS — BP 152/68 | HR 54 | Temp 98.0°F | Ht 70.0 in | Wt 241.4 lb

## 2021-12-29 DIAGNOSIS — R197 Diarrhea, unspecified: Secondary | ICD-10-CM

## 2021-12-29 DIAGNOSIS — C021 Malignant neoplasm of border of tongue: Secondary | ICD-10-CM | POA: Diagnosis not present

## 2021-12-29 DIAGNOSIS — D696 Thrombocytopenia, unspecified: Secondary | ICD-10-CM

## 2021-12-29 DIAGNOSIS — K529 Noninfective gastroenteritis and colitis, unspecified: Secondary | ICD-10-CM | POA: Insufficient documentation

## 2021-12-29 DIAGNOSIS — I10 Essential (primary) hypertension: Secondary | ICD-10-CM | POA: Diagnosis not present

## 2021-12-29 DIAGNOSIS — E1159 Type 2 diabetes mellitus with other circulatory complications: Secondary | ICD-10-CM

## 2021-12-29 DIAGNOSIS — E039 Hypothyroidism, unspecified: Secondary | ICD-10-CM

## 2021-12-29 DIAGNOSIS — I152 Hypertension secondary to endocrine disorders: Secondary | ICD-10-CM

## 2021-12-29 HISTORY — DX: Noninfective gastroenteritis and colitis, unspecified: K52.9

## 2021-12-29 MED ORDER — LOSARTAN POTASSIUM 25 MG PO TABS: 25.0000 mg | ORAL_TABLET | Freq: Two times a day (BID) | ORAL | 3 refills | Status: DC

## 2021-12-29 NOTE — Patient Instructions (Addendum)
BP <90/<60 too low  Take losartan 25 mg 2x per day   Get flu shot at pharmacy for above 65 high dose flu shot   [facility] MD Physician   Primary Contact Information  Phone Fax E-mail Address  [phone] Not available [place] [address]     Specialties     Gastroenterology      Dr. Volanda Napoleon or Dr. Nicki Reaper

## 2021-12-29 NOTE — Progress Notes (Addendum)
Chief Complaint  Patient presents with   Follow-up    3 MONTH F/U   F/u.  1.chronic diarrhea x years stopped metformin 09/24/21 x 10 days (due to thought this was a cause) then stopped glimeperide x 10 days with some increased sugars then the next 10 days he stopped jardiance 25 mg qd. He never started rybelsus 3 mg qd  He tries prn immodium for diarrhea Q3-4 days   2. Htn elevated today on ziac hct 2.5-6.25 mg qd imdur 30 mg qd on losartan 25 mg qd  Dm 2 a1c 6.8 09/2021 pending labs 01/25/22  Fasting with meds cbgs 100-105 and then w/o meds 120-125 and 2 hours after food 150s to 160s and since been off meds sugars 160s-170s  He does use salt on food  He prefers not to restart jardiance 25 mg qd    Review of Systems  Constitutional:  Negative for weight loss.  HENT:  Negative for hearing loss.   Eyes:  Negative for blurred vision.  Respiratory:  Negative for shortness of breath.   Cardiovascular:  Negative for chest pain.  Gastrointestinal:  Negative for abdominal pain and blood in stool.  Musculoskeletal:  Negative for back pain.  Skin:  Negative for rash.  Neurological:  Negative for headaches.  Psychiatric/Behavioral:  Negative for depression.    Past Medical History:  Diagnosis Date   Actinic keratoses    Allergy    Arthritis    low back pain s/p shots and blocks, knee pain   BCC (basal cell carcinoma of skin)    forehead and chest Dr. 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 Tami Ribas, [facility] H/o    Past Surgical History:  Procedure Laterality Date   BIOPSY TONGUE     11/11/15 SCC tongue    cancer of tongue  08/2020   removal of cancer   CARDIAC CATHETERIZATION N/A 11/10/2015   Procedure: Left Heart Cath and Coronary Angiography;  Surgeon: Wellington Hampshire, MD;  Location: [place] [facility];  Service: Cardiovascular;  Laterality: N/A;   COLONOSCOPY  05/2006   COLONOSCOPY WITH PROPOFOL N/A 05/24/2017   Procedure: COLONOSCOPY WITH PROPOFOL;  Surgeon: Lollie Sails, MD;  Location: [facility] ENDOSCOPY;  Service: Endoscopy;  Laterality: N/A;   EXCISION OF TONGUE LESION N/A 11/11/2015   Procedure: EXCISION OF TONGUE LESION/ WITH FROZEN SECTION;  Surgeon: Beverly Gust, MD;  Location: [facility] ORS;  Service: ENT;  Laterality: N/A;   REPLACEMENT TOTAL KNEE Left    REPLACEMENT TOTAL KNEE Right 04/2020   [place] [facility]   right tka minimally invasive 05/06/20 Dr. Armandina Gemma     Family  History  Problem Relation Age of Onset   Myelodysplastic syndrome Mother    Arthritis Mother    Emphysema Father    Alcohol abuse Father    COPD Father    Depression Father    Early death Maternal Grandfather    Heart disease Maternal Grandfather    Stroke Maternal Grandfather    Social History   Socioeconomic History   Marital status: Married    Spouse name: Not on file   Number of children: Not on file   Years of education: Not on file   Highest education level: Not on file  Occupational History   Not on file  Tobacco Use   Smoking status: Never   Smokeless tobacco: Never  Vaping Use   Vaping Use: Never used  Substance and Sexual Activity   Alcohol use: Never    Alcohol/week: 0.0 standard drinks of alcohol   Drug use: No   Sexual activity: Yes  Other Topics Concern   Not on file   Social History Narrative   Married    3 sons    Forensic psychologist, 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    Owns guns, wears seat belt, safe in relationship   Social Determinants of Health   Financial Resource Strain: Low Risk  (02/20/2021)   Overall Financial Resource Strain (CARDIA)    Difficulty of Paying Living Expenses: Not hard at all  Food Insecurity: No Food Insecurity (02/20/2021)   Hunger Vital Sign    Worried About Running Out of Food in the Last Year: Never true    No Name in the Last Year: Never true  Transportation Needs: No Transportation Needs (02/20/2021)   PRAPARE - Hydrologist (Medical): No    Lack of Transportation (Non-Medical): No  Physical Activity: Unknown (01/29/2019)   Exercise Vital Sign    Days of Exercise per Week: 0 days    Minutes of Exercise per Session: Not on file  Stress: No Stress Concern Present (02/20/2021)   [place]    Feeling of Stress : Not at all  Social Connections: Unknown (02/20/2021)   Social Connection and Isolation Panel [NHANES]    Frequency of Communication with Friends and Family: More than three times a week    Frequency of Social Gatherings with Friends and Family: Not on file    Attends Religious Services: More than 4 times per year    Active Member of [employer] or Organizations: Yes    Attends Archivist Meetings: Not on file    Marital Status: Not on file  Intimate Partner Violence: Not At Risk (02/20/2021)   Humiliation, Afraid, Rape, and Kick questionnaire    Fear of Current or Ex-Partner: No    Emotionally Abused: No    Physically Abused: No    Sexually Abused: No   Current Meds  Medication Sig   Acetaminophen (TYLENOL PO) Take 1,300 mg by mouth every other day.   atorvastatin (LIPITOR) 20 MG tablet Take 1 tablet (20 mg total) by mouth daily at 6 PM. At night   bisoprolol-hydrochlorothiazide (ZIAC)  2.5-6.25 MG tablet Take 1 tablet by mouth daily.   chlorhexidine (PERIDEX) 0.12 % solution Use as directed in the mouth or throat as needed.   clopidogrel (PLAVIX) 75 MG tablet TAKE ONE TABLET BY MOUTH DAILY   glucose blood test strip E 11.9 qd Once Daily one touch   isosorbide mononitrate (IMDUR) 30 MG 24  hr tablet TAKE ONE TABLET BY MOUTH EVERY MORNING AND TAKE TWO TABLETS BY MOUTH EVERY EVENING   Lancets (ONETOUCH ULTRASOFT) lancets Qd E 11.9 Once daily   levothyroxine (SYNTHROID) 200 MCG tablet Take 1 tablet (200 mcg total) by mouth daily. In am on empty stomach   liothyronine (CYTOMEL) 5 MCG tablet Take 0.5 tablets (2.5 mcg total) by mouth every other day.   Multiple Vitamins-Minerals (CENTRUM SILVER 50+MEN PO) Take 1 tablet by mouth every morning.    pantoprazole (PROTONIX) 40 MG tablet Take 1 tablet (40 mg total) by mouth daily.   [DISCONTINUED] losartan (COZAAR) 25 MG tablet Take 1 tablet (25 mg total) by mouth daily. Stop ramipril 20 mg qd   Allergies  Allergen Reactions   Penicillins Hives, Rash, Other (See Comments) and Swelling    Has patient had a PCN reaction causing immediate rash, facial/tongue/throat swelling, SOB or lightheadedness with hypotension: Yes Has patient had a PCN reaction causing severe rash involving mucus membranes or skin necrosis: No Has patient had a PCN reaction that required hospitalization No Has patient had a PCN reaction occurring within the last 10 years: No If all of the above answers are "NO", then may proceed with Cephalosporin use.    No results found for this or any previous visit (from the past 2160 hour(s)). Objective  Body mass index is 34.64 kg/m. Wt Readings from Last 3 Encounters:  12/29/21 241 lb 6.4 oz (109.5 kg)  09/25/21 242 lb 6.4 oz (110 kg)  07/27/21 241 lb (109.3 kg)   Temp Readings from Last 3 Encounters:  12/29/21 98 F (36.7 C) (Oral)  09/25/21 98.2 F (36.8 C) (Oral)  07/27/21 98.7 F (37.1 C)   BP Readings from  Last 3 Encounters:  12/29/21 (!) 152/68  09/25/21 130/60  07/27/21 126/60   Pulse Readings from Last 3 Encounters:  12/29/21 (!) 54  09/25/21 60  07/27/21 (!) 55    Physical Exam Vitals and nursing note reviewed.  Constitutional:      Appearance: Normal appearance. He is well-developed and well-groomed.  HENT:     Head: Normocephalic and atraumatic.  Eyes:     Conjunctiva/sclera: Conjunctivae normal.     Pupils: Pupils are equal, round, and reactive to light.  Cardiovascular:     Rate and Rhythm: Normal rate and regular rhythm.     Heart sounds: Normal heart sounds.  Pulmonary:     Effort: Pulmonary effort is normal. No respiratory distress.     Breath sounds: Normal breath sounds.  Abdominal:     Tenderness: There is no abdominal tenderness.  Skin:    General: Skin is warm and moist.  Neurological:     General: No focal deficit present.     Mental Status: He is alert and oriented to person, place, and time. Mental status is at baseline.     Sensory: Sensation is intact.     Motor: Motor function is intact.     Coordination: Coordination is intact.     Gait: Gait is intact. Gait normal.  Psychiatric:        Attention and Perception: Attention and perception normal.        Mood and Affect: Mood and affect normal.        Speech: Speech normal.        Behavior: Behavior normal. Behavior is cooperative.        Thought Content: Thought content normal.        Cognition and Memory: Cognition and memory  normal.        Judgment: Judgment normal.     Assessment  Plan  Hypertension associated with diabetes ([place]) 6.8 09/28/21- Plan: losartan (COZAAR) 25 MG tablet qd change to bid  Avoid salt  stopped metformin 09/24/21 x 10 days (due to thought this was a cause but still with diarrhea) then stopped glimeperide x 10 days with some increased sugars then the next 10 days he stopped jardiance 25 mg qd. He never started rybelsus 3 mg qd and I am worried due to diarrhea  As of 01/2022  he is not even taking amaryl  Will refer to endocrine  Diarrhea, unspecified type - Plan: Ambulatory referral to Gastroenterology Chronic diarrhea - Plan: Ambulatory referral to Gastroenterology  Thrombocytopenia with h/o tongue cancer f/u [facility] Will refer to h/o  HM Flu shot rec 2023  prevnar had 02/03/18 Tdap had 08/02/2018  zostervax per pt had in the past  Had 2/2 shigrix  pna 23 utd  covid 19 vx 3/3 consider booster declines h/o cogid 19+   Colonoscopy 05/24/2017 diverticulosis no etiology for diarrhea -repeat in 10 years Dr. Gustavo Lah   PSA 0.90 h/o mild BPH on imaging f/u urology Dr. Bernardo Heater 04/23/20 f/u annually vs prn no further PSA testing rec.    Dr. Sharlet Salina as of 11/14/19 back issues ok    Derm Dr. Evorn Gong h/o BCC forehead and chest saw 9 or 01/2019 ln2 to scalp still unresolved as of 02/28/2019 rec call back for more ln2 before 1 year  Derm 05/2019 ln2 to scalp f/u in 6 months  Residual scalp Aks x 2 f/u dermatology, scc superficial nose tx'ed as of 11/14/19 Fu Q 6 months appt sch 07/28/20    ENT Dr. Bernestine Amass [facility] cancer f/u Q2 months due 11/18/21   Dr. Tami Ribas ENT    [facility] Dr. Merla Riches Dentist Dr. Eugenie Birks Cardiology Dr. Saunders Revel GI Dr. Gustavo Lah  Dr. Nash Mantis ortho right min. Invasive TKA 05/06/20 in [place]   Rec healthy diet and exercise Provider: Dr. Olivia Mackie McLean-Scocuzza-Internal Medicine

## 2021-12-30 DIAGNOSIS — C44719 Basal cell carcinoma of skin of left lower limb, including hip: Secondary | ICD-10-CM | POA: Diagnosis not present

## 2022-01-25 ENCOUNTER — Other Ambulatory Visit (INDEPENDENT_AMBULATORY_CARE_PROVIDER_SITE_OTHER): Payer: Medicare Other

## 2022-01-25 DIAGNOSIS — E039 Hypothyroidism, unspecified: Secondary | ICD-10-CM

## 2022-01-25 DIAGNOSIS — R319 Hematuria, unspecified: Secondary | ICD-10-CM

## 2022-01-25 DIAGNOSIS — I10 Essential (primary) hypertension: Secondary | ICD-10-CM

## 2022-01-25 DIAGNOSIS — I152 Hypertension secondary to endocrine disorders: Secondary | ICD-10-CM | POA: Diagnosis not present

## 2022-01-25 DIAGNOSIS — E1159 Type 2 diabetes mellitus with other circulatory complications: Secondary | ICD-10-CM | POA: Diagnosis not present

## 2022-01-25 LAB — CBC WITH DIFFERENTIAL/PLATELET
Basophils Absolute: 0 10*3/uL (ref 0.0–0.1)
Basophils Relative: 0.5 % (ref 0.0–3.0)
Eosinophils Absolute: 0.1 10*3/uL (ref 0.0–0.7)
Eosinophils Relative: 1.5 % (ref 0.0–5.0)
HCT: 43 % (ref 39.0–52.0)
Hemoglobin: 14.7 g/dL (ref 13.0–17.0)
Lymphocytes Relative: 28 % (ref 12.0–46.0)
Lymphs Abs: 1.9 10*3/uL (ref 0.7–4.0)
MCHC: 34.3 g/dL (ref 30.0–36.0)
MCV: 88.6 fl (ref 78.0–100.0)
Monocytes Absolute: 0.6 10*3/uL (ref 0.1–1.0)
Monocytes Relative: 8.8 % (ref 3.0–12.0)
Neutro Abs: 4.1 10*3/uL (ref 1.4–7.7)
Neutrophils Relative %: 61.2 % (ref 43.0–77.0)
Platelets: 144 10*3/uL — ABNORMAL LOW (ref 150.0–400.0)
RBC: 4.85 Mil/uL (ref 4.22–5.81)
RDW: 15.3 % (ref 11.5–15.5)
WBC: 6.7 10*3/uL (ref 4.0–10.5)

## 2022-01-25 LAB — COMPREHENSIVE METABOLIC PANEL
ALT: 21 U/L (ref 0–53)
AST: 21 U/L (ref 0–37)
Albumin: 3.8 g/dL (ref 3.5–5.2)
Alkaline Phosphatase: 84 U/L (ref 39–117)
BUN: 11 mg/dL (ref 6–23)
CO2: 28 mEq/L (ref 19–32)
Calcium: 8.5 mg/dL (ref 8.4–10.5)
Chloride: 102 mEq/L (ref 96–112)
Creatinine, Ser: 1 mg/dL (ref 0.40–1.50)
GFR: 73.02 mL/min (ref >60.00)
Glucose, Bld: 132 mg/dL — ABNORMAL HIGH (ref 70–99)
Potassium: 4.2 mEq/L (ref 3.5–5.1)
Sodium: 139 mEq/L (ref 135–145)
Total Bilirubin: 0.9 mg/dL (ref 0.2–1.2)
Total Protein: 6.6 g/dL (ref 6.0–8.3)

## 2022-01-25 LAB — LIPID PANEL
Cholesterol: 93 mg/dL (ref 0–200)
HDL: 27.6 mg/dL — ABNORMAL LOW (ref >39.00)
NonHDL: 65.57
Total CHOL/HDL Ratio: 3
Triglycerides: 213 mg/dL — ABNORMAL HIGH (ref 0.0–149.0)
VLDL: 42.6 mg/dL — ABNORMAL HIGH (ref 0.0–40.0)

## 2022-01-25 LAB — LDL CHOLESTEROL, DIRECT: Direct LDL: 31 mg/dL

## 2022-01-25 LAB — HEMOGLOBIN A1C: Hgb A1c MFr Bld: 7.5 % — ABNORMAL HIGH (ref 4.6–6.5)

## 2022-01-25 LAB — TSH: TSH: 1.45 u[IU]/mL (ref 0.35–5.50)

## 2022-01-26 ENCOUNTER — Telehealth: Payer: Self-pay

## 2022-01-26 DIAGNOSIS — K589 Irritable bowel syndrome without diarrhea: Secondary | ICD-10-CM | POA: Diagnosis not present

## 2022-01-26 DIAGNOSIS — Z23 Encounter for immunization: Secondary | ICD-10-CM | POA: Diagnosis not present

## 2022-01-26 LAB — URINALYSIS, ROUTINE W REFLEX MICROSCOPIC
Bacteria, UA: NONE SEEN /HPF
Bilirubin Urine: NEGATIVE
Glucose, UA: NEGATIVE
Hgb urine dipstick: NEGATIVE
Hyaline Cast: NONE SEEN /LPF
Ketones, ur: NEGATIVE
Leukocytes,Ua: NEGATIVE
Nitrite: NEGATIVE
RBC / HPF: NONE SEEN /HPF (ref 0–2)
Specific Gravity, Urine: 1.008 (ref 1.001–1.035)
Squamous Epithelial / HPF: NONE SEEN /HPF (ref <=5)
WBC, UA: NONE SEEN /HPF (ref 0–5)
pH: 6 (ref 5.0–8.0)

## 2022-01-26 LAB — MICROALBUMIN / CREATININE URINE RATIO
Creatinine, Urine: 58 mg/dL (ref 20–320)
Microalb Creat Ratio: 171 mcg/mg creat — ABNORMAL HIGH (ref <30)
Microalb, Ur: 9.9 mg/dL

## 2022-01-26 LAB — MICROSCOPIC MESSAGE

## 2022-01-26 NOTE — Telephone Encounter (Signed)
LMOM for pt to CB in regards to labs 

## 2022-01-26 NOTE — Telephone Encounter (Signed)
Pt returning call

## 2022-01-27 ENCOUNTER — Ambulatory Visit (INDEPENDENT_AMBULATORY_CARE_PROVIDER_SITE_OTHER): Payer: Medicare Other

## 2022-01-27 DIAGNOSIS — G4733 Obstructive sleep apnea (adult) (pediatric): Secondary | ICD-10-CM

## 2022-01-27 DIAGNOSIS — D696 Thrombocytopenia, unspecified: Secondary | ICD-10-CM | POA: Insufficient documentation

## 2022-01-27 HISTORY — DX: Thrombocytopenia, unspecified: D69.6

## 2022-01-27 NOTE — Addendum Note (Signed)
Addended by: Orland Mustard on: 01/27/2022 12:43 PM   Modules accepted: Orders

## 2022-01-27 NOTE — Progress Notes (Signed)
95 percentile pressure 6.6   95th percentile leak 13.1   apnea index 0.7 /hr  apnea-hypopnea index  8.5 /hr   total days used  >4 hr 89 days  total days used <4 hr 1 days  Total compliance 99 percent  Pt changed his cpap pressure on his own to max of 6.6 advised that this is not where he needs he agreed to increase max to 8 cmh2o to see if helps AHI. Pt was seen by Claiborne Billings  RRT/RCP  from [facility]

## 2022-02-02 ENCOUNTER — Inpatient Hospital Stay: Payer: Medicare Other | Attending: Oncology | Admitting: Oncology

## 2022-02-02 ENCOUNTER — Encounter: Payer: Self-pay | Admitting: Oncology

## 2022-02-02 ENCOUNTER — Inpatient Hospital Stay: Payer: Medicare Other

## 2022-02-02 VITALS — BP 159/55 | HR 55 | Temp 98.5°F | Resp 20 | Ht 70.0 in | Wt 239.1 lb

## 2022-02-02 DIAGNOSIS — D696 Thrombocytopenia, unspecified: Secondary | ICD-10-CM | POA: Insufficient documentation

## 2022-02-02 DIAGNOSIS — Z85810 Personal history of malignant neoplasm of tongue: Secondary | ICD-10-CM | POA: Insufficient documentation

## 2022-02-02 DIAGNOSIS — C760 Malignant neoplasm of head, face and neck: Secondary | ICD-10-CM

## 2022-02-02 NOTE — Progress Notes (Signed)
Hematology/Oncology Consult note [facility] [phone] [phone]  Patient Care Team: McLean-Scocuzza, Nino Glow, MD as PCP - General (Internal Medicine) End, Harrell Gave, MD as PCP - Cardiology (Cardiology)   Name of the patient: Tony Lawrence  [id number]  February 22, 1946    Reason for referral-history of tongue cancer and thrombocytopenia   Referring physician-Dr. 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  He was then diagnosed with recurrent disease in May 2022 and underwent left partial glossectomy, left neck dissection at [facility] with Dr. Frazier Butt.  Pathology showed 2.1 cm squamous cell carcinoma with a positive LVI, negative PNI.  Clear margins.  1 out of 32 lymph nodes involved.  No evidence of extra nuclear extension.  He decided to forego adjuvant radiation therapy.  Patient has been referred to me for thrombocytopenia. Most recent CBC from 01/25/2022 showed a white cell count of 6.7, H&H of 14.7/43 and a platelet count of 144.  Prior to that his platelet count was 147 in June 2023.  Historically his platelet counts have been normal between 1 50-200.  ECOG PS- 1  Pain scale- 0   Review of systems- Review of Systems  Constitutional:  Negative for chills, fever, malaise/fatigue and weight loss.  HENT:  Negative for congestion, ear discharge and nosebleeds.   Eyes:  Negative for blurred vision.  Respiratory:  Negative for cough, hemoptysis, sputum production, shortness of breath and wheezing.   Cardiovascular:  Negative for chest pain, palpitations, orthopnea and claudication.  Gastrointestinal:  Negative for abdominal pain, blood in stool, constipation, diarrhea, heartburn, melena, nausea and vomiting.  Genitourinary:  Negative for dysuria, flank pain,  frequency, hematuria and urgency.  Musculoskeletal:  Negative for back pain, joint pain and myalgias.  Skin:  Negative for rash.  Neurological:  Negative for dizziness, tingling, focal weakness, seizures, weakness and headaches.  Endo/Heme/Allergies:  Does not bruise/bleed easily.  Psychiatric/Behavioral:  Negative for depression and suicidal ideas. The patient does not have insomnia.     Allergies  Allergen Reactions   Penicillins Hives, Rash, Other (See Comments) and Swelling    Has patient had a PCN reaction causing immediate rash, facial/tongue/throat swelling, SOB or lightheadedness with hypotension: Yes Has patient had a PCN reaction causing severe rash involving mucus membranes or skin necrosis: No Has patient had a PCN reaction that required hospitalization No Has patient had a PCN reaction occurring within the last 10 years: No If all of the above answers are "NO", then may proceed with Cephalosporin use.     Patient Active Problem List   Diagnosis Date Noted   Thrombocytopenia ([place]) 01/27/2022   Chronic diarrhea 12/29/2021   Valvular heart disease 03/23/2021   Onychomycosis 02/27/2021   Contusion of toenail 02/27/2021   Annual physical exam 02/27/2021   Centrilobular emphysema ([place]) 02/25/2021   Nodule of right lung 02/25/2021   Hypertension associated with diabetes ([place]) 11/18/2020   SCC (squamous cell carcinoma)    Tongue cancer ([place])    Mitral valve insufficiency 12/22/2019   Morbid obesity ([place]) 12/22/2019   Obesity (BMI 30-39.9) 11/15/2019   H/O total knee replacement, 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   Arthritis of right knee 11/15/2019   Benign prostatic hyperplasia with nocturia 11/15/2019   Squamous cell carcinoma in situ    Actinic keratoses    Erectile dysfunction due to arterial insufficiency  04/24/2019   Primary osteoarthritis of left knee 11/22/2018   Gastroesophageal reflux disease without esophagitis 03/21/2018   History of skin cancer 03/21/2018   History of  tongue cancer 03/21/2018   OSA on CPAP 03/21/2018   Diarrhea 03/21/2018   Hypothyroidism 03/21/2018   Chronic low back pain 03/14/2018   Coronary artery disease of native artery of native heart with stable angina pectoris ([place]) 12/22/2017   Essential hypertension 12/22/2017   Hyperlipidemia associated with type 2 diabetes mellitus ([place]) 12/22/2017   Central obesity 03/01/2016   Cancer of ventral surface of tongue ([place]) 11/04/2015   Malignant neoplasm of tongue, tip and lateral border ([place]) 11/04/2015   Primary osteoarthritis of both knees 05/13/2014   Other intervertebral disc displacement, lumbar region 01/16/2014   Neuritis or radiculitis due to rupture of lumbar intervertebral disc 01/16/2014   Parapelvic renal cyst 12/18/2013   Flank pain 11/30/2013   Arthritis, degenerative 03/12/2013   Diabetes mellitus ([place]) 03/12/2013   Increased frequency of urination 03/12/2013   Slowing of urinary stream 03/12/2013   Urinary hesitancy 03/12/2013   Benign prostatic hyperplasia with urinary obstruction 03/06/2012     Past Medical History:  Diagnosis Date   Actinic keratoses    Allergy    Arthritis    low back pain s/p shots and blocks, knee pain   BCC (basal cell carcinoma of skin)    forehead and chest Dr. 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 Ihor Austin excised 08/25/20 surgical exc [facility] ?recurrent vs new primary    Squamous cell carcinoma in situ    nose 2021    Tongue cancer ([place])    Squamous cell CA of tongue 11/11/15 no chemo  or radiation ENT Dr. Tami Ribas, [facility] H[facility]      Past Surgical History:  Procedure Laterality Date   BIOPSY TONGUE     11/11/15 SCC tongue    cancer of tongue  08/2020   removal of cancer   CARDIAC CATHETERIZATION N/A 11/10/2015   Procedure: Left Heart Cath and Coronary Angiography;  Surgeon: Wellington Hampshire, MD;  Location: [place] [facility];  Service: Cardiovascular;  Laterality: N/A;   COLONOSCOPY  05/2006   COLONOSCOPY WITH PROPOFOL N/A 05/24/2017   Procedure: COLONOSCOPY WITH PROPOFOL;  Surgeon: Lollie Sails, MD;  Location: [facility] ENDOSCOPY;  Service: Endoscopy;  Laterality: N/A;   EXCISION OF TONGUE LESION N/A 11/11/2015   Procedure: EXCISION OF TONGUE LESION/ WITH FROZEN SECTION;  Surgeon: Beverly Gust, MD;  Location: [facility] ORS;  Service: ENT;  Laterality: N/A;   REPLACEMENT TOTAL KNEE Left    REPLACEMENT TOTAL KNEE Right 04/2020   [place] [facility]   right tka minimally invasive 05/06/20 Dr. Armandina Gemma      Social History   Socioeconomic History   Marital status: Married    Spouse name: Not on file   Number of children: Not on file   Years  of education: Not on file   Highest education level: Not on file  Occupational History   Not on file  Tobacco Use   Smoking status: Never   Smokeless tobacco: Never  Vaping Use   Vaping Use: Never used  Substance and Sexual Activity   Alcohol use: Never    Alcohol/week: 0.0 standard drinks of alcohol   Drug use: No   Sexual activity: Yes  Other Topics Concern   Not on file  Social History Narrative   Married    3 sons    Forensic psychologist, 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    Owns guns, wears seat belt, safe in relationship   Social Determinants of [place] Strain: [place]  (02/20/2021)   Overall Financial Resource Strain (CARDIA)    Difficulty of Paying Living Expenses: Not hard at all  Food Insecurity: No Food Insecurity (02/20/2021)   Hunger Vital Sign    Worried About  Running Out of Food in the Last Year: Never true    [place] in the Last Year: Never true  Transportation Needs: No Transportation Needs (02/20/2021)   PRAPARE - Hydrologist (Medical): No    Lack of Transportation (Non-Medical): No  Physical Activity: Unknown (01/29/2019)   Exercise Vital Sign    Days of Exercise per Week: 0 days    Minutes of Exercise per Session: Not on file  Stress: No Stress Concern Present (02/20/2021)   [place]    Feeling of Stress : Not at all  Social Connections: Unknown (02/20/2021)   Social Connection and Isolation Panel [NHANES]    Frequency of Communication with Friends and Family: More than three times a week    Frequency of Social Gatherings with Friends and Family: Not on file    Attends Religious Services: More than 4 times per year    Active Member of [employer] or Organizations: Yes    Attends Archivist Meetings: Not on file    Marital Status: Not on file  Intimate Partner Violence: Not At Risk (02/20/2021)   Humiliation, Afraid, Rape, and Kick questionnaire    Fear of Current or Ex-Partner: No    Emotionally Abused: No    Physically Abused: No    Sexually Abused: No     Family History  Problem Relation Age of Onset   Myelodysplastic syndrome Mother    Arthritis Mother    Emphysema Father    Alcohol abuse Father    COPD Father    Depression Father    Early death Maternal Grandfather    Heart disease Maternal Grandfather    Stroke Maternal Grandfather      Current Outpatient Medications:    empagliflozin (JARDIANCE) 25 MG TABS tablet, Take 1 tablet by mouth daily., Disp: , Rfl:    oxyCODONE (OXY IR/ROXICODONE) 5 MG immediate release tablet, Take by mouth., Disp: , Rfl:    Acetaminophen (TYLENOL PO), Take 1,300 mg by mouth every other day., Disp: , Rfl:    atorvastatin (LIPITOR) 20 MG tablet, Take 1 tablet (20 mg total) by mouth  daily at 6 PM. At night, Disp: 90 tablet, Rfl: 3   bisoprolol-hydrochlorothiazide (ZIAC) 2.5-6.25 MG tablet, Take 1 tablet by mouth daily., Disp: 90 tablet, Rfl: 3   clopidogrel (PLAVIX) 75 MG tablet, TAKE ONE TABLET BY MOUTH DAILY, Disp: 90 tablet, Rfl: 0   clopidogrel (PLAVIX) 75 MG tablet, Take by mouth.,  Disp: , Rfl:    glimepiride (AMARYL) 1 MG tablet, Take 1 tablet (1 mg total) by mouth daily with breakfast. (Patient not taking: Reported on 12/29/2021), Disp: 90 tablet, Rfl: 3   glucose blood test strip, E 11.9 qd Once Daily one touch, Disp: 100 each, Rfl: 12   isosorbide mononitrate (IMDUR) 30 MG 24 hr tablet, TAKE ONE TABLET BY MOUTH EVERY MORNING AND TAKE TWO TABLETS BY MOUTH EVERY EVENING, Disp: 270 tablet, Rfl: 0   Lancets (ONETOUCH ULTRASOFT) lancets, Qd E 11.9 Once daily, Disp: 100 each, Rfl: 12   levothyroxine (SYNTHROID) 200 MCG tablet, Take 1 tablet (200 mcg total) by mouth daily. In am on empty stomach, Disp: 90 tablet, Rfl: 3   liothyronine (CYTOMEL) 5 MCG tablet, Take 0.5 tablets (2.5 mcg total) by mouth every other day., Disp: 45 tablet, Rfl: 3   losartan (COZAAR) 25 MG tablet, Take 1 tablet (25 mg total) by mouth in the morning and at bedtime. Stop ramipril 20 mg qd, d/c losartan 25 mg qd, Disp: 90 tablet, Rfl: 3   metFORMIN (GLUCOPHAGE) 1000 MG tablet, Take 1,000 mg by mouth daily., Disp: , Rfl:    Multiple Vitamins-Minerals (CENTRUM SILVER 50+MEN PO), Take 1 tablet by mouth every morning. , Disp: , Rfl:    pantoprazole (PROTONIX) 40 MG tablet, Take 1 tablet (40 mg total) by mouth daily., Disp: 90 tablet, Rfl: 3   Physical exam:  Vitals:   02/02/22 1057  BP: (!) 159/55  Pulse: (!) 55  Resp: 20  Temp: 98.5 F (36.9 C)  SpO2: 98%  Weight: 239 lb 1.6 oz (108.5 kg)  Height: '5\' 10"'$  (1.778 m)   Physical Exam Constitutional:      General: He is not in acute distress. Cardiovascular:     Rate and Rhythm: Normal rate and regular rhythm.     Heart sounds: Normal heart  sounds.  Pulmonary:     Effort: Pulmonary effort is normal.     Breath sounds: Normal breath sounds.  Abdominal:     General: Bowel sounds are normal.     Palpations: Abdomen is soft.  Lymphadenopathy:     Comments: No palpable cervical, supraclavicular, axillary or inguinal adenopathy    Skin:    General: Skin is warm and dry.  Neurological:     Mental Status: He is alert and oriented to person, place, and time.           Latest Ref Rng & Units 01/25/2022    8:04 AM  CMP  Glucose 70 - 99 mg/dL 132   BUN 6 - 23 mg/dL 11   Creatinine 0.40 - 1.50 mg/dL 1.00   Sodium 135 - 145 mEq/L 139   Potassium 3.5 - 5.1 mEq/L 4.2   Chloride 96 - 112 mEq/L 102   CO2 19 - 32 mEq/L 28   Calcium 8.4 - 10.5 mg/dL 8.5   Total Protein 6.0 - 8.3 g/dL 6.6   Total Bilirubin 0.2 - 1.2 mg/dL 0.9   Alkaline Phos 39 - 117 U/L 84   AST 0 - 37 U/L 21   ALT 0 - 53 U/L 21       Latest Ref Rng & Units 01/25/2022    8:04 AM  CBC  WBC 4.0 - 10.5 K/uL 6.7   Hemoglobin 13.0 - 17.0 g/dL 14.7   Hematocrit 39.0 - 52.0 % 43.0   Platelets 150.0 - 400.0 K/uL 144.0     No images are attached to the encounter.  No results found.  Assessment and plan- Patient is a 76 y.o. male who has been referred for following issues:  Squamous cell carcinoma of the left lateral tongue T2 N1 M0 s/p left partial glossectomy and neck dissection at [facility].  He declined adjuvant radiation therapy.  He continues to follow-up with Dr. Ihor Austin at [facility] for this and does not require to see me for the same issue.  As such there is no role for routine surveillance imaging at this time and he would not require periodic ENT exams which will be coordinated by Dr. Frazier Butt  With regards to thrombocytopenia; he has mild thrombocytopenia with a platelet count of 144 and his platelet counts have been historically within normal limits.  No other cytopenias.  I am inclined to monitor this conservatively without any further work-up at this time.   I will repeat his CBC with differential in 6 months along with B12 folate HIV and hepatitis C testing and see him thereafter   Thank you for this kind referral and the opportunity to participate in the care of this patient   Visit Diagnosis 1. Thrombocytopenia ([place])   2. Head and neck cancer [facility])     Dr. Randa Evens, MD, MPH [facility] - Closed [phone] 02/02/2022

## 2022-02-17 DIAGNOSIS — C029 Malignant neoplasm of tongue, unspecified: Secondary | ICD-10-CM | POA: Diagnosis not present

## 2022-02-18 ENCOUNTER — Telehealth: Payer: Self-pay | Admitting: Internal Medicine

## 2022-02-18 NOTE — Telephone Encounter (Signed)
Copied from [place] [phone]. Topic: Medicare AWV >> Feb 18, 2022  2:37 PM Devoria Glassing wrote: Reason for CRM: Left message for patient to schedule Annual Wellness Visit.  Please schedule with Nurse Health Advisor Denisa O'Brien-Blaney, LPN at [facility]. This appt can be telephone or office visit.  Please call [phone] ask for [facility]

## 2022-02-22 ENCOUNTER — Encounter (INDEPENDENT_AMBULATORY_CARE_PROVIDER_SITE_OTHER): Payer: Self-pay

## 2022-03-02 DIAGNOSIS — E1165 Type 2 diabetes mellitus with hyperglycemia: Secondary | ICD-10-CM | POA: Diagnosis not present

## 2022-03-02 DIAGNOSIS — I152 Hypertension secondary to endocrine disorders: Secondary | ICD-10-CM | POA: Diagnosis not present

## 2022-03-02 DIAGNOSIS — E039 Hypothyroidism, unspecified: Secondary | ICD-10-CM | POA: Diagnosis not present

## 2022-03-02 DIAGNOSIS — R809 Proteinuria, unspecified: Secondary | ICD-10-CM | POA: Diagnosis not present

## 2022-03-02 DIAGNOSIS — E1169 Type 2 diabetes mellitus with other specified complication: Secondary | ICD-10-CM | POA: Diagnosis not present

## 2022-03-02 DIAGNOSIS — E1129 Type 2 diabetes mellitus with other diabetic kidney complication: Secondary | ICD-10-CM | POA: Diagnosis not present

## 2022-03-02 DIAGNOSIS — E1159 Type 2 diabetes mellitus with other circulatory complications: Secondary | ICD-10-CM | POA: Diagnosis not present

## 2022-03-02 DIAGNOSIS — E785 Hyperlipidemia, unspecified: Secondary | ICD-10-CM | POA: Diagnosis not present

## 2022-03-02 DIAGNOSIS — E65 Localized adiposity: Secondary | ICD-10-CM | POA: Diagnosis not present

## 2022-03-11 ENCOUNTER — Other Ambulatory Visit: Payer: Self-pay | Admitting: Internal Medicine

## 2022-03-22 ENCOUNTER — Other Ambulatory Visit: Payer: Self-pay | Admitting: Internal Medicine

## 2022-03-25 ENCOUNTER — Ambulatory Visit: Payer: Medicare Other | Attending: Internal Medicine | Admitting: Internal Medicine

## 2022-03-25 ENCOUNTER — Encounter: Payer: Self-pay | Admitting: Internal Medicine

## 2022-03-25 VITALS — BP 144/80 | HR 55 | Ht 70.0 in | Wt 249.8 lb

## 2022-03-25 DIAGNOSIS — E1159 Type 2 diabetes mellitus with other circulatory complications: Secondary | ICD-10-CM | POA: Diagnosis not present

## 2022-03-25 DIAGNOSIS — I7121 Aneurysm of the ascending aorta, without rupture: Secondary | ICD-10-CM | POA: Diagnosis not present

## 2022-03-25 DIAGNOSIS — I25118 Atherosclerotic heart disease of native coronary artery with other forms of angina pectoris: Secondary | ICD-10-CM | POA: Diagnosis not present

## 2022-03-25 DIAGNOSIS — Z79899 Other long term (current) drug therapy: Secondary | ICD-10-CM | POA: Diagnosis not present

## 2022-03-25 DIAGNOSIS — I152 Hypertension secondary to endocrine disorders: Secondary | ICD-10-CM | POA: Diagnosis not present

## 2022-03-25 DIAGNOSIS — I351 Nonrheumatic aortic (valve) insufficiency: Secondary | ICD-10-CM | POA: Diagnosis not present

## 2022-03-25 MED ORDER — LOSARTAN POTASSIUM 50 MG PO TABS: 50.0000 mg | ORAL_TABLET | Freq: Two times a day (BID) | ORAL | 3 refills | Status: DC

## 2022-03-25 NOTE — Progress Notes (Signed)
Follow-up Outpatient Visit Date: 03/25/2022  Primary Care Provider: McLean-Scocuzza, Nino Glow, MD [place] [id number]  Chief Complaint: Follow-up coronary artery disease  HPI:  Tony Lawrence is a 76 y.o. male with history of coronary artery disease with chronic total occlusion of the mid LAD, mild aortic regurgitation, mild mitral valve stenosis, hypertension, hyperlipidemia, diabetes mellitus, obstructive sleep apnea, and hypothyroidism, who presents for follow-up of coronary artery disease.  I last saw him a year ago, which Tony Lawrence was feeling well.  No medication changes or additional testing were pursued.  Today, Tony Lawrence reports that he is feeling well.  He has not had any chest pain, shortness of breath, palpitations, or lightheadedness.  He notes some mild dependent edema with long car rides but otherwise has not had any swelling.  He has experienced some low blood sugars at times as well as side effects related to metformin (GI upset) and empagliflozin (yeast infection).  He is now on glimepiride monotherapy for management of his DM.  He checks his blood pressures occasionally at home and notes that they are often around 160/80.  --------------------------------------------------------------------------------------------------  Past Medical History:  Diagnosis Date   Actinic keratoses    Allergy    Arthritis    low back pain s/p shots and blocks, knee pain   BCC (basal cell carcinoma of skin)    forehead and chest Dr. Evorn Gong q 6 months    CAD (coronary artery disease)    Complication of anesthesia    SEVERE SORE THROAT AFTER BIOPSY 2010   Coronary artery disease    Chronic total occlusion of mid LAD   COVID-19    11/17/20   Diabetes mellitus without complication (HCC)    GERD (gastroesophageal reflux disease)    History of chicken pox    History of shingles    Hyperlipidemia    Hypertension    Hypothyroidism    OSA on CPAP    SCC (squamous cell  carcinoma)    invasive left Post. lateral Tongue [facility] Dr. Ihor Austin excised 08/25/20 surgical exc [facility] ?recurrent vs new primary    Squamous cell carcinoma in situ    nose 2021    Tongue cancer ([place])    Squamous cell CA of tongue 11/11/15 no chemo or radiation ENT Dr. Tami Ribas, [facility] H/o    Past Surgical History:  Procedure Laterality Date   BIOPSY TONGUE     11/11/15 SCC tongue    cancer of tongue  08/2020   removal of cancer   CARDIAC CATHETERIZATION N/A 11/10/2015   Procedure: Left Heart Cath and Coronary Angiography;  Surgeon: Wellington Hampshire, MD;  Location: [place] [facility] LAB;  Service: Cardiovascular;  Laterality: N/A;   COLONOSCOPY  05/2006   COLONOSCOPY WITH PROPOFOL N/A 05/24/2017   Procedure: COLONOSCOPY WITH PROPOFOL;  Surgeon: Lollie Sails, MD;  Location: [facility] ENDOSCOPY;  Service: Endoscopy;  Laterality: N/A;   EXCISION OF TONGUE LESION N/A 11/11/2015   Procedure: EXCISION OF TONGUE LESION/ WITH FROZEN SECTION;  Surgeon: Beverly Gust, MD;  Location: [facility] ORS;  Service: ENT;  Laterality: N/A;   REPLACEMENT TOTAL KNEE Left    REPLACEMENT TOTAL KNEE Right 04/2020   [place] [facility]   right tka minimally invasive 05/06/20 Dr. Armandina Gemma      Current Meds  Medication Sig   Acetaminophen (TYLENOL PO) Take 1,300 mg by mouth every other day.   atorvastatin (LIPITOR) 20 MG tablet Take 1 tablet (20 mg total) by mouth  daily at 6 PM. At night   bisoprolol-hydrochlorothiazide (ZIAC) 2.5-6.25 MG tablet Take 1 tablet by mouth daily.   clopidogrel (PLAVIX) 75 MG tablet TAKE 1 TABLET BY MOUTH DAILY   co-enzyme Q-10 30 MG capsule Take 30 mg by mouth 3 (three) times daily.   glimepiride (AMARYL) 1 MG tablet Take 1 tablet (1 mg total) by mouth daily with breakfast.   glucose blood test strip E 11.9 qd Once Daily one touch   isosorbide mononitrate (IMDUR) 30 MG 24 hr tablet TAKE ONE TABLET BY MOUTH EVERY MORNING AND TAKE TWO TABLETS BY MOUTH EVERY EVENING   Lancets  (ONETOUCH ULTRASOFT) lancets Qd E 11.9 Once daily   levothyroxine (SYNTHROID) 200 MCG tablet Take 1 tablet (200 mcg total) by mouth daily. In am on empty stomach   liothyronine (CYTOMEL) 5 MCG tablet Take 0.5 tablets (2.5 mcg total) by mouth every other day.   losartan (COZAAR) 25 MG tablet Take 1 tablet (25 mg total) by mouth in the morning and at bedtime. Stop ramipril 20 mg qd, d/c losartan 25 mg qd   Multiple Vitamins-Minerals (CENTRUM SILVER 50+MEN PO) Take 1 tablet by mouth every morning.    pantoprazole (PROTONIX) 40 MG tablet Take 1 tablet (40 mg total) by mouth daily.    Allergies: Penicillins  Social History   Tobacco Use   Smoking status: Never   Smokeless tobacco: Never  Vaping Use   Vaping Use: Never used  Substance Use Topics   Alcohol use: Never    Alcohol/week: 0.0 standard drinks of alcohol   Drug use: No    Family History  Problem Relation Age of Onset   Myelodysplastic syndrome Mother    Arthritis Mother    Emphysema Father    Alcohol abuse Father    COPD Father    Depression Father    Early death Maternal Grandfather    Heart disease Maternal Grandfather    Stroke Maternal Grandfather     Review of Systems: A 12-system review of systems was performed and was negative except as noted in the HPI.  --------------------------------------------------------------------------------------------------  Physical Exam: BP (!) 144/80 (BP Location: Left Arm, Patient Position: Sitting, Cuff Size: Normal)   Pulse (!) 55   Ht '5\' 10"'$  (1.778 m)   Wt 249 lb 12.8 oz (113.3 kg)   SpO2 97%   BMI 35.84 kg/m   General:  NAD. Neck: No JVD or HJR. Lungs: Clear to auscultation bilaterally without wheezes or crackles. Heart: Bradycardic but regular with 2/6 systolic and early diastolic murmurs. Abdomen: Soft, nontender, nondistended. Extremities: Trace pretibial edema.    EKG:  Sinus bradycardia with left axis deviation.  Heart rate has decreased since 03/23/2021.   Otherwise, no significant interval change.  Lab Results  Component Value Date   WBC 6.7 01/25/2022   HGB 14.7 01/25/2022   HCT 43.0 01/25/2022   MCV 88.6 01/25/2022   PLT 144.0 (L) 01/25/2022    Lab Results  Component Value Date   NA 139 01/25/2022   K 4.2 01/25/2022   CL 102 01/25/2022   CO2 28 01/25/2022   BUN 11 01/25/2022   CREATININE 1.00 01/25/2022   GLUCOSE 132 (H) 01/25/2022   ALT 21 01/25/2022    Lab Results  Component Value Date   CHOL 93 01/25/2022   HDL 27.60 (L) 01/25/2022   LDLCALC 28 09/28/2021   LDLDIRECT 31.0 01/25/2022   TRIG 213.0 (H) 01/25/2022   CHOLHDL 3 01/25/2022    --------------------------------------------------------------------------------------------------  ASSESSMENT AND PLAN:  Coronary artery disease with stable angina: Tony Lawrence continues to do well without any angina on his current antianginal regimen of bisoprolol and isosorbide mononitrate.  We will continue these medications as well as secondary prevention with atorvastatin and clopidogrel.  Hypertension: Blood pressure suboptimally controlled at home and in the office today.  We have agreed to increase losartan to 50 mg twice daily and continue current doses of bisoprolol-HCTZ and isosorbide mononitrate.  BMP will be rechecked in about 2 weeks.  Hyperlipidemia associated with type 2 diabetes mellitus: LDL well-controlled on last check in October, though triglycerides were mildly elevated.  Triglycerides have been under better control in the past.  We discussed addition of Vascepa for secondary prevention but will try lifestyle modifications first.  We will plan to repeat a lipid panel shortly before our follow-up visit in 6 months.  Thoracic aortic aneurysm and aortic regurgitation: Last echo in 2021 showed mild aortic regurgitation and mild dilation of the ascending aorta.  We will plan to reassess both with an echocardiogram shortly before our follow-up visit in 6 months.   Importance of blood pressure control was reinforced.  Follow-up: Return to clinic in 6 months.  Nelva Bush, MD 03/25/2022 3:05 PM

## 2022-03-25 NOTE — Patient Instructions (Addendum)
Medication Instructions:  - Your physician has recommended you make the following change in your medication:   1) INCREASE Losartan 50 mg: - take 1 tablet by mouth twice daily   *If you need a refill on your cardiac medications before your next appointment, please call your pharmacy*   Lab Work: - Your physician recommends that you return for lab work in: 2 weeks BMP   - Your physician recommends that you return for FASTING lab work in: 6 months (just prior to your follow up with Dr. Saunders Revel)  Lipid panel  DO NOT eat/ drink anything for 8 hours prior to your lab draw except for water/ black coffee  If you have labs (blood work) drawn today and your tests are completely normal, you will receive your results only by: [place] (if you have MyChart) OR A paper copy in the mail If you have any lab test that is abnormal or we need to change your treatment, we will call you to review the results.   Testing/Procedures: 1) Echocardiogram: in 6 months (just prior to you follow up with Dr. Saunders Revel)  - Your physician has requested that you have an echocardiogram. Echocardiography is a painless test that uses sound waves to create images of your heart. It provides your doctor with information about the size and shape of your heart and how well your heart's chambers and valves are working. This procedure takes approximately one hour. There are no restrictions for this procedure. There is a possibility that an IV may need to be started during your test to inject an image enhancing agent. This is done to obtain more optimal pictures of your heart. Therefore we ask that you do at least drink some water prior to coming in to hydrate your veins.   Please do NOT wear cologne, perfume, aftershave, or lotions (deodorant is allowed). Please arrive 15 minutes prior to your appointment time.    Follow-Up: At [facility], you and your health needs are our priority.  As part of our continuing  mission to provide you with exceptional heart care, we have created designated Provider Care Teams.  These Care Teams include your primary Cardiologist (physician) and Advanced Practice Providers (APPs -  Physician Assistants and Nurse Practitioners) who all work together to provide you with the care you need, when you need it.  We recommend signing up for the patient portal called "MyChart".  Sign up information is provided on this After Visit Summary.  MyChart is used to connect with patients for Virtual Visits (Telemedicine).  Patients are able to view lab/test results, encounter notes, upcoming appointments, etc.  Non-urgent messages can be sent to your provider as well.   To learn more about what you can do with MyChart, go to [email or web address].    Your next appointment:   6 month(s)  The format for your next appointment:   In Person  Provider:   Nelva Bush, MD    Other Instructions  Echocardiogram An echocardiogram is a test that uses sound waves (ultrasound) to produce images of the heart. Images from an echocardiogram can provide important information about: Heart size and shape. The size and thickness and movement of your heart's walls. Heart muscle function and strength. Heart valve function or if you have stenosis. Stenosis is when the heart valves are too narrow. If blood is flowing backward through the heart valves (regurgitation). A tumor or infectious growth around the heart valves. Areas of heart muscle that are not working well  because of poor blood flow or injury from a heart attack. Aneurysm detection. An aneurysm is a weak or damaged part of an artery wall. The wall bulges out from the normal force of blood pumping through the body. Tell a health care provider about: Any allergies you have. All medicines you are taking, including vitamins, herbs, eye drops, creams, and over-the-counter medicines. Any blood disorders you have. Any surgeries you have  had. Any medical conditions you have. Whether you are pregnant or may be pregnant. What are the risks? Generally, this is a safe test. However, problems may occur, including an allergic reaction to dye (contrast) that may be used during the test. What happens before the test? No specific preparation is needed. You may eat and drink normally. What happens during the test?  You will take off your clothes from the waist up and put on a hospital gown. Electrodes or electrocardiogram (ECG)patches may be placed on your chest. The electrodes or patches are then connected to a device that monitors your heart rate and rhythm. You will lie down on a table for an ultrasound exam. A gel will be applied to your chest to help sound waves pass through your skin. A handheld device, called a transducer, will be pressed against your chest and moved over your heart. The transducer produces sound waves that travel to your heart and bounce back (or "echo" back) to the transducer. These sound waves will be captured in real-time and changed into images of your heart that can be viewed on a video monitor. The images will be recorded on a computer and reviewed by your health care provider. You may be asked to change positions or hold your breath for a short time. This makes it easier to get different views or better views of your heart. In some cases, you may receive contrast through an IV in one of your veins. This can improve the quality of the pictures from your heart. The procedure may vary among health care providers and hospitals. What can I expect after the test? You may return to your normal, everyday life, including diet, activities, and medicines, unless your health care provider tells you not to do that. Follow these instructions at home: It is up to you to get the results of your test. Ask your health care provider, or the department that is doing the test, when your results will be ready. Keep all follow-up  visits. This is important. Summary An echocardiogram is a test that uses sound waves (ultrasound) to produce images of the heart. Images from an echocardiogram can provide important information about the size and shape of your heart, heart muscle function, heart valve function, and other possible heart problems. You do not need to do anything to prepare before this test. You may eat and drink normally. After the echocardiogram is completed, you may return to your normal, everyday life, unless your health care provider tells you not to do that. This information is not intended to replace advice given to you by your health care provider. Make sure you discuss any questions you have with your health care provider. Document Revised: 12/24/2020 Document Reviewed: 12/04/2019 Elsevier Patient Education  [place].

## 2022-03-27 ENCOUNTER — Encounter: Payer: Self-pay | Admitting: Internal Medicine

## 2022-03-27 DIAGNOSIS — I351 Nonrheumatic aortic (valve) insufficiency: Secondary | ICD-10-CM

## 2022-03-27 DIAGNOSIS — I7121 Aneurysm of the ascending aorta, without rupture: Secondary | ICD-10-CM | POA: Insufficient documentation

## 2022-03-27 HISTORY — DX: Nonrheumatic aortic (valve) insufficiency: I35.1

## 2022-03-27 HISTORY — DX: Aneurysm of the ascending aorta, without rupture: I71.21

## 2022-03-28 ENCOUNTER — Emergency Department: Payer: Medicare Other

## 2022-03-28 ENCOUNTER — Other Ambulatory Visit: Payer: Self-pay

## 2022-03-28 ENCOUNTER — Observation Stay: Payer: Medicare Other

## 2022-03-28 ENCOUNTER — Observation Stay
Admission: EM | Admit: 2022-03-28 | Discharge: 2022-03-29 | Disposition: A | Discharge status: Home / Self Care | Payer: Medicare Other | Attending: Internal Medicine | Admitting: Internal Medicine

## 2022-03-28 DIAGNOSIS — I5032 Chronic diastolic (congestive) heart failure: Secondary | ICD-10-CM | POA: Diagnosis present

## 2022-03-28 DIAGNOSIS — Z8616 Personal history of COVID-19: Secondary | ICD-10-CM | POA: Insufficient documentation

## 2022-03-28 DIAGNOSIS — I160 Hypertensive urgency: Secondary | ICD-10-CM | POA: Diagnosis not present

## 2022-03-28 DIAGNOSIS — Z96653 Presence of artificial knee joint, bilateral: Secondary | ICD-10-CM | POA: Diagnosis not present

## 2022-03-28 DIAGNOSIS — E118 Type 2 diabetes mellitus with unspecified complications: Secondary | ICD-10-CM

## 2022-03-28 DIAGNOSIS — R1310 Dysphagia, unspecified: Secondary | ICD-10-CM | POA: Diagnosis not present

## 2022-03-28 DIAGNOSIS — E119 Type 2 diabetes mellitus without complications: Secondary | ICD-10-CM | POA: Diagnosis not present

## 2022-03-28 DIAGNOSIS — M47812 Spondylosis without myelopathy or radiculopathy, cervical region: Secondary | ICD-10-CM | POA: Diagnosis not present

## 2022-03-28 DIAGNOSIS — G4733 Obstructive sleep apnea (adult) (pediatric): Secondary | ICD-10-CM | POA: Diagnosis not present

## 2022-03-28 DIAGNOSIS — I6523 Occlusion and stenosis of bilateral carotid arteries: Secondary | ICD-10-CM | POA: Diagnosis not present

## 2022-03-28 DIAGNOSIS — M4802 Spinal stenosis, cervical region: Secondary | ICD-10-CM | POA: Diagnosis not present

## 2022-03-28 DIAGNOSIS — I10 Essential (primary) hypertension: Secondary | ICD-10-CM | POA: Diagnosis not present

## 2022-03-28 DIAGNOSIS — Z7984 Long term (current) use of oral hypoglycemic drugs: Secondary | ICD-10-CM | POA: Insufficient documentation

## 2022-03-28 DIAGNOSIS — G459 Transient cerebral ischemic attack, unspecified: Secondary | ICD-10-CM | POA: Diagnosis not present

## 2022-03-28 DIAGNOSIS — Z79899 Other long term (current) drug therapy: Secondary | ICD-10-CM | POA: Insufficient documentation

## 2022-03-28 DIAGNOSIS — I672 Cerebral atherosclerosis: Secondary | ICD-10-CM | POA: Diagnosis not present

## 2022-03-28 DIAGNOSIS — Z85828 Personal history of other malignant neoplasm of skin: Secondary | ICD-10-CM | POA: Insufficient documentation

## 2022-03-28 DIAGNOSIS — E1165 Type 2 diabetes mellitus with hyperglycemia: Secondary | ICD-10-CM | POA: Insufficient documentation

## 2022-03-28 DIAGNOSIS — R739 Hyperglycemia, unspecified: Secondary | ICD-10-CM

## 2022-03-28 DIAGNOSIS — R4781 Slurred speech: Secondary | ICD-10-CM | POA: Diagnosis not present

## 2022-03-28 DIAGNOSIS — I110 Hypertensive heart disease with heart failure: Secondary | ICD-10-CM | POA: Insufficient documentation

## 2022-03-28 DIAGNOSIS — Z85810 Personal history of malignant neoplasm of tongue: Secondary | ICD-10-CM | POA: Insufficient documentation

## 2022-03-28 DIAGNOSIS — I2510 Atherosclerotic heart disease of native coronary artery without angina pectoris: Secondary | ICD-10-CM | POA: Diagnosis present

## 2022-03-28 DIAGNOSIS — I739 Peripheral vascular disease, unspecified: Secondary | ICD-10-CM | POA: Diagnosis not present

## 2022-03-28 DIAGNOSIS — R4701 Aphasia: Secondary | ICD-10-CM | POA: Diagnosis not present

## 2022-03-28 DIAGNOSIS — E785 Hyperlipidemia, unspecified: Secondary | ICD-10-CM | POA: Diagnosis not present

## 2022-03-28 DIAGNOSIS — R29818 Other symptoms and signs involving the nervous system: Secondary | ICD-10-CM | POA: Diagnosis not present

## 2022-03-28 DIAGNOSIS — Z7902 Long term (current) use of antithrombotics/antiplatelets: Secondary | ICD-10-CM | POA: Diagnosis not present

## 2022-03-28 DIAGNOSIS — E669 Obesity, unspecified: Secondary | ICD-10-CM | POA: Diagnosis not present

## 2022-03-28 DIAGNOSIS — E039 Hypothyroidism, unspecified: Secondary | ICD-10-CM | POA: Diagnosis present

## 2022-03-28 HISTORY — DX: Hyperlipidemia, unspecified: E78.5

## 2022-03-28 HISTORY — DX: Atherosclerotic heart disease of native coronary artery without angina pectoris: I25.10

## 2022-03-28 LAB — CBC
HCT: 48.2 % (ref 39.0–52.0)
Hemoglobin: 15.6 g/dL (ref 13.0–17.0)
MCH: 30.1 pg (ref 26.0–34.0)
MCHC: 32.4 g/dL (ref 30.0–36.0)
MCV: 92.9 fL (ref 80.0–100.0)
Platelets: 162 10*3/uL (ref 150–400)
RBC: 5.19 MIL/uL (ref 4.22–5.81)
RDW: 13.6 % (ref 11.5–15.5)
WBC: 7.5 10*3/uL (ref 4.0–10.5)
nRBC: 0 % (ref 0.0–0.2)

## 2022-03-28 LAB — PROTIME-INR
INR: 1.2 (ref 0.8–1.2)
Prothrombin Time: 14.6 seconds (ref 11.4–15.2)

## 2022-03-28 LAB — CBG MONITORING, ED
Glucose-Capillary: 226 mg/dL — ABNORMAL HIGH (ref 70–99)
Glucose-Capillary: 198 mg/dL — ABNORMAL HIGH (ref 70–99)
Glucose-Capillary: 128 mg/dL — ABNORMAL HIGH (ref 70–99)
Glucose-Capillary: 139 mg/dL — ABNORMAL HIGH (ref 70–99)

## 2022-03-28 LAB — COMPREHENSIVE METABOLIC PANEL
ALT: 26 U/L (ref 0–44)
AST: 31 U/L (ref 15–41)
Albumin: 4 g/dL (ref 3.5–5.0)
Alkaline Phosphatase: 92 U/L (ref 38–126)
Anion gap: 7 (ref 5–15)
BUN: 16 mg/dL (ref 8–23)
CO2: 27 mmol/L (ref 22–32)
Calcium: 8.9 mg/dL (ref 8.9–10.3)
Chloride: 103 mmol/L (ref 98–111)
Creatinine, Ser: 1.06 mg/dL (ref 0.61–1.24)
GFR, Estimated: >60 mL/min (ref >60)
Glucose, Bld: 265 mg/dL — ABNORMAL HIGH (ref 70–99)
Potassium: 4.2 mmol/L (ref 3.5–5.1)
Sodium: 137 mmol/L (ref 135–145)
Total Bilirubin: 0.9 mg/dL (ref 0.3–1.2)
Total Protein: 7.5 g/dL (ref 6.5–8.1)

## 2022-03-28 LAB — DIFFERENTIAL
Abs Immature Granulocytes: 0.02 10*3/uL (ref 0.00–0.07)
Basophils Absolute: 0 10*3/uL (ref 0.0–0.1)
Basophils Relative: 0 %
Eosinophils Absolute: 0.1 10*3/uL (ref 0.0–0.5)
Eosinophils Relative: 2 %
Immature Granulocytes: 0 %
Lymphocytes Relative: 29 %
Lymphs Abs: 2.2 10*3/uL (ref 0.7–4.0)
Monocytes Absolute: 0.6 10*3/uL (ref 0.1–1.0)
Monocytes Relative: 8 %
Neutro Abs: 4.6 10*3/uL (ref 1.7–7.7)
Neutrophils Relative %: 61 %

## 2022-03-28 LAB — BRAIN NATRIURETIC PEPTIDE: B Natriuretic Peptide: 183.7 pg/mL — ABNORMAL HIGH (ref 0.0–100.0)

## 2022-03-28 LAB — APTT: aPTT: 32 seconds (ref 24–36)

## 2022-03-28 LAB — ETHANOL: Alcohol, Ethyl (B): <10 mg/dL (ref <10)

## 2022-03-28 MED ORDER — ATORVASTATIN CALCIUM 20 MG PO TABS
20.0000 mg | ORAL_TABLET | Freq: Every day | ORAL | Status: DC
  Administered 2022-03-28: 20 mg via ORAL
  Filled 2022-03-28: qty 1

## 2022-03-28 MED ORDER — SODIUM CHLORIDE 0.9% FLUSH: 3.0000 mL | Freq: Once | INTRAVENOUS | Status: DC

## 2022-03-28 MED ORDER — INSULIN ASPART 100 UNIT/ML IJ SOLN: 7.0000 [IU] | Freq: Once | INTRAMUSCULAR | Status: DC

## 2022-03-28 MED ORDER — HYDRALAZINE HCL 20 MG/ML IJ SOLN: 5.0000 mg | INTRAMUSCULAR | Status: DC | PRN

## 2022-03-28 MED ORDER — ENOXAPARIN SODIUM 40 MG/0.4ML IJ SOSY: 40.0000 mg | PREFILLED_SYRINGE | INTRAMUSCULAR | Status: DC

## 2022-03-28 MED ORDER — PANTOPRAZOLE SODIUM 40 MG PO TBEC
40.0000 mg | DELAYED_RELEASE_TABLET | Freq: Every day | ORAL | Status: DC
  Administered 2022-03-29: 40 mg via ORAL
  Filled 2022-03-28: qty 1

## 2022-03-28 MED ORDER — SENNOSIDES-DOCUSATE SODIUM 8.6-50 MG PO TABS: 1.0000 | ORAL_TABLET | Freq: Every evening | ORAL | Status: DC | PRN

## 2022-03-28 MED ORDER — COENZYME Q10 30 MG PO CAPS: 30.0000 mg | ORAL_CAPSULE | Freq: Three times a day (TID) | ORAL | Status: DC

## 2022-03-28 MED ORDER — ENOXAPARIN SODIUM 60 MG/0.6ML IJ SOSY
0.5000 mg/kg | PREFILLED_SYRINGE | INTRAMUSCULAR | Status: DC
  Administered 2022-03-28: 55 mg via SUBCUTANEOUS
  Filled 2022-03-28: qty 0.6

## 2022-03-28 MED ORDER — ADULT MULTIVITAMIN W/MINERALS CH
1.0000 | ORAL_TABLET | Freq: Every morning | ORAL | Status: DC
  Administered 2022-03-29: 1 via ORAL
  Filled 2022-03-28: qty 1

## 2022-03-28 MED ORDER — ACETAMINOPHEN 650 MG RE SUPP: 650.0000 mg | RECTAL | Status: DC | PRN

## 2022-03-28 MED ORDER — INSULIN ASPART 100 UNIT/ML IJ SOLN
5.0000 [IU] | Freq: Once | INTRAMUSCULAR | Status: AC
  Administered 2022-03-28: 5 [IU] via SUBCUTANEOUS
  Filled 2022-03-28: qty 1

## 2022-03-28 MED ORDER — CLOPIDOGREL BISULFATE 75 MG PO TABS
75.0000 mg | ORAL_TABLET | Freq: Every day | ORAL | Status: DC
  Administered 2022-03-29: 75 mg via ORAL
  Filled 2022-03-28: qty 1

## 2022-03-28 MED ORDER — STROKE: EARLY STAGES OF RECOVERY BOOK: Freq: Once | Status: DC

## 2022-03-28 MED ORDER — ONDANSETRON HCL 4 MG/2ML IJ SOLN: 4.0000 mg | Freq: Three times a day (TID) | INTRAMUSCULAR | Status: DC | PRN

## 2022-03-28 MED ORDER — ACETAMINOPHEN 160 MG/5ML PO SOLN: 650.0000 mg | ORAL | Status: DC | PRN

## 2022-03-28 MED ORDER — LEVOTHYROXINE SODIUM 100 MCG PO TABS
200.0000 ug | ORAL_TABLET | Freq: Every day | ORAL | Status: DC
  Administered 2022-03-29: 200 ug via ORAL
  Filled 2022-03-28: qty 4

## 2022-03-28 MED ORDER — LIOTHYRONINE SODIUM 5 MCG PO TABS: 2.5000 ug | ORAL_TABLET | ORAL | Status: DC

## 2022-03-28 MED ORDER — INSULIN ASPART 100 UNIT/ML IJ SOLN
0.0000 [IU] | Freq: Three times a day (TID) | INTRAMUSCULAR | Status: DC
  Administered 2022-03-28 – 2022-03-29 (×2): 1 [IU] via SUBCUTANEOUS
  Filled 2022-03-28 (×2): qty 1

## 2022-03-28 MED ORDER — ACETAMINOPHEN 325 MG PO TABS
650.0000 mg | ORAL_TABLET | ORAL | Status: DC | PRN
  Administered 2022-03-28: 650 mg via ORAL
  Filled 2022-03-28: qty 2

## 2022-03-28 MED ORDER — IOHEXOL 350 MG/ML SOLN
75.0000 mL | Freq: Once | INTRAVENOUS | Status: AC | PRN
  Administered 2022-03-28: 75 mL via INTRAVENOUS

## 2022-03-28 MED ORDER — INSULIN ASPART 100 UNIT/ML IJ SOLN: 0.0000 [IU] | Freq: Every day | INTRAMUSCULAR | Status: DC

## 2022-03-28 NOTE — Consult Note (Signed)
Stroke cart activated at 1204 Per note, Dr Rory Percy at bedside at 1158 in [facility] paged by TS RN at [phone]

## 2022-03-28 NOTE — ED Notes (Signed)
Patient to MRI with this RN.

## 2022-03-28 NOTE — ED Notes (Signed)
Patient to CT with this RN.

## 2022-03-28 NOTE — ED Notes (Signed)
This RN now assuming care of pt.

## 2022-03-28 NOTE — ED Provider Notes (Signed)
[facility] Provider Note    Event Date/Time   First MD Initiated Contact with Patient 03/28/22 1209     (approximate)   History   Aphasia   HPI  Tony Lawrence is a 76 y.o. male with a history of hypertension hyperlipidemia CAD presents to the ER for evaluation of difficulty with his speech as well as trouble doing simple math writing checks this morning around 1120.  States he called for his wife at that time they are both at home and she noted that he was having trouble speaking.  Patient states that he was having trouble expressing words but knew what he wanted to say.  They came immediately to the ER.  Code stroke was called on arrival.     Physical Exam   Triage Vital Signs: ED Triage Vitals  Enc Vitals Group     BP 03/28/22 1152 (!) 219/80     Pulse Rate 03/28/22 1152 64     Resp 03/28/22 1152 18     Temp 03/28/22 1152 98.3 F (36.8 C)     Temp Source 03/28/22 1152 Oral     SpO2 03/28/22 1152 97 %     Weight 03/28/22 1149 245 lb 9.5 oz (111.4 kg)     Height 03/28/22 1149 '5\' 10"'$  (1.778 m)     Head Circumference --      Peak Flow --      Pain Score --      Pain Loc --      Pain Edu? --      Excl. in [place]? --     Most recent vital signs: Vitals:   03/28/22 1211 03/28/22 1241  BP: (!) 208/55 (!) 172/63  Pulse:  (!) 58  Resp:  17  Temp:    SpO2:  98%     Constitutional: Alert  Eyes: Conjunctivae are normal.  Head: Atraumatic. Nose: No congestion/rhinnorhea. Mouth/Throat: Mucous membranes are moist.   Neck: Painless ROM.  Cardiovascular:   Good peripheral circulation. Respiratory: Normal respiratory effort.  No retractions.  Gastrointestinal: Soft and nontender.  Musculoskeletal:  no deformity Neurologic:  CN- intact.  No facial droop, Normal FNF.  Normal heel to shin.  Sensation intact bilaterally. Normal speech and language. No gross focal neurologic deficits are appreciated. Slight dysarthric speech Skin:  Skin is warm, dry  and intact. No rash noted. Psychiatric: Mood and affect are normal. Speech and behavior are normal.    ED Results / Procedures / Treatments   Labs (all labs ordered are listed, but only abnormal results are displayed) Labs Reviewed  COMPREHENSIVE METABOLIC PANEL - Abnormal; Notable for the following components:      Result Value   Glucose, Bld 265 (*)    All other components within normal limits  CBG MONITORING, ED - Abnormal; Notable for the following components:   Glucose-Capillary 226 (*)    All other components within normal limits  PROTIME-INR  APTT  CBC  DIFFERENTIAL  ETHANOL  CBG MONITORING, ED     EKG  ED ECG REPORT I, Merlyn Lot, the attending physician, personally viewed and interpreted this ECG.   Date: 03/28/2022  EKG Time: 12:41  Rate: 60  Rhythm: sinus  Axis: left  Intervals:lbbb  ST&T Change: nonspecific st abn, no stemi    RADIOLOGY Please see ED Course for my review and interpretation.  I personally reviewed all radiographic images ordered to evaluate for the above acute complaints and reviewed radiology reports and findings.  These findings were personally discussed with the patient.  Please see medical record for radiology report.    PROCEDURES:  Critical Care performed: No  Procedures   MEDICATIONS ORDERED IN ED: Medications  sodium chloride flush (NS) 0.9 % injection 3 mL (0 mLs Intravenous Hold 03/28/22 1233)  insulin aspart (novoLOG) injection 5 Units (has no administration in time range)     IMPRESSION / MDM / ASSESSMENT AND PLAN / ED COURSE  I reviewed the triage vital signs and the nursing notes.                              Differential diagnosis includes, but is not limited to, cva, tia, hypoglycemia, dehydration, electrolyte abnormality, dissection, sepsis  Patient presenting to the ER for evaluation of symptoms as described above.  Based on symptoms, risk factors and considered above differential, this presenting  complaint could reflect a potentially life-threatening illness therefore the patient will be placed on continuous pulse oximetry and telemetry for monitoring.  Laboratory evaluation will be sent to evaluate for the above complaints.  Code stroke called upon arrival.  Neuro evaluated patient emergently in CT.    Clinical Course as of 03/28/22 1259  Sun Mar 28, 2022  1212 CT head on my review and interpretation does not show any evidence of SDH or IPH. [PR]  1251 Stat MRI was ordered by neurology which does not show any evidence of acute finding.  On reassessment he feels like his symptoms are improving but given his age risk factors hypertension hyperglycemia patient felt to be high enough risk TIA symptoms that would require admission for further monitoring and medical work-up.  Will consult hospitalist for admission. [PR]    Clinical Course User Index [PR] Merlyn Lot, MD     FINAL CLINICAL IMPRESSION(S) / ED DIAGNOSES   Final diagnoses:  TIA (transient ischemic attack)  Hypertensive urgency  Hyperglycemia     Rx / DC Orders   ED Discharge Orders     None        Note:  This document was prepared using Dragon voice recognition software and may include unintentional dictation errors.    Merlyn Lot, MD 03/28/22 1259

## 2022-03-28 NOTE — ED Triage Notes (Signed)
Pt via POV from home. Pt c/o slurred speech and difficulty writing with his R hand. LKW 1120. States that speech is still slurred and states it still garbled. Denies numbness or tingling. Denies weakness. Pt is A&Ox4 and NAD

## 2022-03-28 NOTE — Progress Notes (Signed)
PHARMACIST - PHYSICIAN ORDER COMMUNICATION  CONCERNING: P&T Medication Policy on Herbal Medications  DESCRIPTION:  This patient's order for:  co-enzyme Q-10 capsule 30 mg   has been noted.  This product(s) is classified as an "herbal" or natural product. Due to a lack of definitive safety studies or FDA approval, nonstandard manufacturing practices, plus the potential risk of unknown drug-drug interactions while on inpatient medications, the Pharmacy and Therapeutics Committee does not permit the use of "herbal" or natural products of this type within [facility].   ACTION TAKEN: The pharmacy department is unable to verify this order at this time.  Please reevaluate patient's clinical condition at discharge and address if the herbal or natural product(s) should be resumed at that time.  Pernell Dupre, PharmD, BCPS Clinical Pharmacist 03/28/2022 2:11 PM

## 2022-03-28 NOTE — Progress Notes (Signed)
PHARMACIST - PHYSICIAN COMMUNICATION  CONCERNING:  Enoxaparin (Lovenox) for DVT Prophylaxis    RECOMMENDATION: Patient was prescribed enoxaprin '40mg'$  q24 hours for VTE prophylaxis.   Filed Weights   03/28/22 1149  Weight: 111.4 kg (245 lb 9.5 oz)    Body mass index is 35.24 kg/m.  Estimated Creatinine Clearance: 74.1 mL/min (by C-G formula based on SCr of 1.06 mg/dL).   Based on [place] patient is candidate for enoxaparin 0.'5mg'$ /kg TBW SQ every 24 hours based on BMI being >30.   DESCRIPTION: Pharmacy has adjusted enoxaparin dose per [facility] policy.  Patient is now receiving enoxaparin 55 mg every 24 hours    Pernell Dupre, PharmD, BCPS Clinical Pharmacist 03/28/2022 1:32 PM

## 2022-03-28 NOTE — H&P (Signed)
History and Physical    Tony Lawrence:[id number] DOB: [birth year] DOA: 03/28/2022  Referring MD/NP/PA:   PCP: McLean-Scocuzza, Nino Glow, MD   Patient coming from:  The patient is coming from home.  At baseline, pt is independent for most of ADL.        Chief Complaint: Difficulty speaking   HPI: Tony Lawrence is a 76 y.o. male with medical history significant of hypertension, hyperlipidemia, diabetes mellitus, hypothyroidism, GERD, OSA on CPAP, CAD, diastolic CHF, obesity with BMI 35.24, squamous cell tongue cancer, who presents with difficulty speaking  Pt was last known normal at around 112, but suddenly developed difficulty speaking with garbled speech. Pt also has difficulty calculating simple math when writing a check per his wife.  He denies unilateral numbness or weakness in the extremities.  No facial droop.  No vision loss. Does not have chest pain, cough, shortness breath.  No nausea vomiting, diarrhea or abdominal pain.  No symptoms of UTI. When I saw pt in ED, he still has garbled speech and difficult speaking.  Data reviewed independently and ED Course: pt was found to have WBC 7.5, INR 1.2, PTT 32, alcohol level less than 10, electrolytes and renal function okay, temperature normal, blood pressure 219/80, 172/63, heart rate 58, RR 17, oxygen saturation 97% on room air.  CT of head is negative.  MRI for brain is negative for acute stroke.  Patient is placed on telemetry bed for position, Dr. Rory Percy of neurology is consulted.  EKG: I have personally reviewed.  Sinus rhythm, QTc 446, LAD, poor R wave progression   Review of Systems:   General: no fevers, chills, no body weight gain, fatigue HEENT: no blurry vision, hearing changes or sore throat Respiratory: no dyspnea, coughing, wheezing CV: no chest pain, no palpitations GI: no nausea, vomiting, abdominal pain, diarrhea, constipation GU: no dysuria, burning on urination, increased urinary frequency, hematuria  Ext: no  leg edema Neuro: no unilateral weakness, numbness, or tingling, no vision change or hearing loss.  Has difficulty speaking Skin: no rash, no skin tear. MSK: No muscle spasm, no deformity, no limitation of range of movement in spin Heme: No easy bruising.  Travel history: No recent long distant travel.   Allergy:  Allergies  Allergen Reactions   Penicillins Hives, Rash, Other (See Comments) and Swelling    Has patient had a PCN reaction causing immediate rash, facial/tongue/throat swelling, SOB or lightheadedness with hypotension: Yes Has patient had a PCN reaction causing severe rash involving mucus membranes or skin necrosis: No Has patient had a PCN reaction that required hospitalization No Has patient had a PCN reaction occurring within the last 10 years: No If all of the above answers are "NO", then may proceed with Cephalosporin use.     Past Medical History:  Diagnosis Date   Actinic keratoses    Allergy    Arthritis    low back pain s/p shots and blocks, knee pain   BCC (basal cell carcinoma of skin)    forehead and chest Dr. Evorn Gong q 6 months    CAD (coronary artery disease)    Complication of anesthesia    SEVERE SORE THROAT AFTER BIOPSY 2010   Coronary artery disease    Chronic total occlusion of mid LAD   COVID-19    11/17/20   Diabetes mellitus without complication (HCC)    GERD (gastroesophageal reflux disease)    History of chicken pox    History of shingles    Hyperlipidemia  Hypertension    Hypothyroidism    OSA on CPAP    SCC (squamous cell carcinoma)    invasive left Post. lateral Tongue [facility] Dr. Ihor Austin excised 08/25/20 surgical exc unc ?recurrent vs new primary    Squamous cell carcinoma in situ    nose 2021    Tongue cancer ([place])    Squamous cell CA of tongue 11/11/15 no chemo or radiation ENT Dr. Tami Ribas, [facility] H/o     Past Surgical History:  Procedure Laterality Date   BIOPSY TONGUE     11/11/15 SCC tongue    cancer of tongue  08/2020    removal of cancer   CARDIAC CATHETERIZATION N/A 11/10/2015   Procedure: Left Heart Cath and Coronary Angiography;  Surgeon: Wellington Hampshire, MD;  Location: [place] [facility];  Service: Cardiovascular;  Laterality: N/A;   COLONOSCOPY  05/2006   COLONOSCOPY WITH PROPOFOL N/A 05/24/2017   Procedure: COLONOSCOPY WITH PROPOFOL;  Surgeon: Lollie Sails, MD;  Location: [facility] ENDOSCOPY;  Service: Endoscopy;  Laterality: N/A;   EXCISION OF TONGUE LESION N/A 11/11/2015   Procedure: EXCISION OF TONGUE LESION/ WITH FROZEN SECTION;  Surgeon: Beverly Gust, MD;  Location: [facility] ORS;  Service: ENT;  Laterality: N/A;   REPLACEMENT TOTAL KNEE Left    REPLACEMENT TOTAL KNEE Right 04/2020   [place] [facility] [place] [facility]   right tka minimally invasive 05/06/20 Dr. Armandina Gemma      Social History:  reports that he has never smoked. He has never used smokeless tobacco. He reports that he does not drink alcohol and does not use drugs.  Family History:  Family History  Problem Relation Age of Onset   Myelodysplastic syndrome Mother    Arthritis Mother    Emphysema Father    Alcohol abuse Father    COPD Father    Depression Father    Early death Maternal Grandfather    Heart disease Maternal Grandfather    Stroke Maternal Grandfather      Prior to Admission medications   Medication Sig Start Date End Date Taking? Authorizing Provider  Acetaminophen (TYLENOL PO) Take 1,300 mg by mouth every other day.    [provider]  atorvastatin (LIPITOR) 20 MG tablet Take 1 tablet (20 mg total) by mouth daily at 6 PM. At night 09/25/21   McLean-Scocuzza, Nino Glow, MD  bisoprolol-hydrochlorothiazide (ZIAC) 2.5-6.25 MG tablet Take 1 tablet by mouth daily. 09/25/21   McLean-Scocuzza, Nino Glow, MD  clopidogrel (PLAVIX) 75 MG tablet TAKE 1 TABLET BY MOUTH DAILY 03/11/22   End, Harrell Gave, MD  co-enzyme Q-10 30 MG capsule Take 30 mg by mouth 3 (three) times daily.    [provider]  glimepiride  (AMARYL) 1 MG tablet Take 1 tablet (1 mg total) by mouth daily with breakfast. 09/25/21   McLean-Scocuzza, Nino Glow, MD  glucose blood test strip E 11.9 qd Once Daily one touch 09/25/21   McLean-Scocuzza, Nino Glow, MD  isosorbide mononitrate (IMDUR) 30 MG 24 hr tablet TAKE ONE TABLET BY MOUTH EVERY MORNING AND TAKE TWO TABLETS BY MOUTH EVERY EVENING 03/22/22   End, Harrell Gave, MD  Lancets [facility] ULTRASOFT) lancets Qd E 11.9 Once daily 09/25/21   McLean-Scocuzza, Nino Glow, MD  levothyroxine (SYNTHROID) 200 MCG tablet Take 1 tablet (200 mcg total) by mouth daily. In am on empty stomach 09/25/21   McLean-Scocuzza, Nino Glow, MD  liothyronine (CYTOMEL) 5 MCG tablet Take 0.5 tablets (2.5 mcg total) by mouth every other day. 09/25/21   McLean-Scocuzza, Olivia Mackie  N, MD  losartan (COZAAR) 50 MG tablet Take 1 tablet (50 mg total) by mouth in the morning and at bedtime. Stop ramipril 20 mg qd, d/c losartan 25 mg qd 03/25/22   End, Harrell Gave, MD  Multiple Vitamins-Minerals (CENTRUM SILVER 50+MEN PO) Take 1 tablet by mouth every morning.     [provider]  pantoprazole (PROTONIX) 40 MG tablet Take 1 tablet (40 mg total) by mouth daily. 09/25/21   McLean-Scocuzza, Nino Glow, MD    Physical Exam: Vitals:   03/28/22 1241 03/28/22 1245 03/28/22 1300 03/28/22 1315  BP: (!) 172/63 (!) 176/64 (!) 169/67 (!) 144/63  Pulse: (!) 58 (!) 59 (!) 58 (!) 54  Resp: '17 15 13 11  '$ Temp:      TempSrc:      SpO2: 98% 98% 97% 96%  Weight:      Height:       General: Not in acute distress HEENT:       Eyes: PERRL, EOMI, no scleral icterus.       ENT: No discharge from the ears and nose, no pharynx injection, no tonsillar enlargement.        Neck: No JVD, no bruit, no mass felt. Heme: No neck lymph node enlargement. Cardiac: S1/S2, RRR, No murmurs, No gallops or rubs. Respiratory: No rales, wheezing, rhonchi or rubs. GI: Soft, nondistended, nontender, no rebound pain, no organomegaly, BS present. GU: No hematuria Ext: No  pitting leg edema bilaterally. 1+DP/PT pulse bilaterally. Musculoskeletal: No joint deformities, No joint redness or warmth, no limitation of ROM in spin. Skin: No rashes.  Neuro: Alert, oriented X3, cranial nerves II-XII grossly intact. Muscle strength 5/5 in all extremities, sensation to light touch intact. Brachial reflex 2+ bilaterally.  Has difficulty speaking Psych: Patient is not psychotic, no suicidal or hemocidal ideation.  Labs on Admission: I have personally reviewed following labs and imaging studies  CBC: Recent Labs  Lab 03/28/22 1154  WBC 7.5  NEUTROABS 4.6  HGB 15.6  HCT 48.2  MCV 92.9  PLT 532   Basic Metabolic Panel: Recent Labs  Lab 03/28/22 1154  NA 137  K 4.2  CL 103  CO2 27  GLUCOSE 265*  BUN 16  CREATININE 1.06  CALCIUM 8.9   GFR: Estimated Creatinine Clearance: 74.1 mL/min (by C-G formula based on SCr of 1.06 mg/dL). Liver Function Tests: Recent Labs  Lab 03/28/22 1154  AST 31  ALT 26  ALKPHOS 92  BILITOT 0.9  PROT 7.5  ALBUMIN 4.0   No results for input(s): "LIPASE", "AMYLASE" in the last 168 hours. No results for input(s): "AMMONIA" in the last 168 hours. Coagulation Profile: Recent Labs  Lab 03/28/22 1154  INR 1.2   Cardiac Enzymes: No results for input(s): "CKTOTAL", "CKMB", "CKMBINDEX", "TROPONINI" in the last 168 hours. BNP (last 3 results) No results for input(s): "PROBNP" in the last 8760 hours. HbA1C: No results for input(s): "HGBA1C" in the last 72 hours. CBG: Recent Labs  Lab 03/28/22 1150 03/28/22 1349  GLUCAP 226* 198*   Lipid Profile: No results for input(s): "CHOL", "HDL", "LDLCALC", "TRIG", "CHOLHDL", "LDLDIRECT" in the last 72 hours. Thyroid Function Tests: No results for input(s): "TSH", "T4TOTAL", "FREET4", "T3FREE", "THYROIDAB" in the last 72 hours. Anemia Panel: No results for input(s): "VITAMINB12", "FOLATE", "FERRITIN", "TIBC", "IRON", "RETICCTPCT" in the last 72 hours. Urine analysis:     Component Value Date/Time   COLORURINE YELLOW 01/25/2022 0803   APPEARANCEUR CLEAR 01/25/2022 0803   APPEARANCEUR Clear 04/23/2020 1311  LABSPEC 1.008 01/25/2022 0803   PHURINE 6.0 01/25/2022 0803   GLUCOSEU NEGATIVE 01/25/2022 0803   GLUCOSEU >=1000 (A) 03/21/2018 1355   HGBUR NEGATIVE 01/25/2022 0803   BILIRUBINUR Negative 04/23/2020 1311   KETONESUR NEGATIVE 01/25/2022 0803   PROTEINUR TRACE (A) 01/25/2022 0803   UROBILINOGEN 0.2 03/21/2018 1355   NITRITE NEGATIVE 01/25/2022 0803   LEUKOCYTESUR NEGATIVE 01/25/2022 0803   Sepsis Labs: '@LABRCNTIP'$ (procalcitonin:4,lacticidven:4) )No results found for this or any previous visit (from the past 240 hour(s)).   Radiological Exams on Admission: MR BRAIN WO CONTRAST  Result Date: 03/28/2022 CLINICAL DATA:  Neuro deficit, acute, stroke suspected. Sudden onset of dysphagia. EXAM: MRI HEAD WITHOUT CONTRAST TECHNIQUE: Multiplanar, multiecho pulse sequences of the brain and surrounding structures were obtained without intravenous contrast. COMPARISON:  CT head without contrast 03/28/2022. MR head without contrast 07/03/2014 FINDINGS: Brain: No acute infarct, hemorrhage, or mass lesion is present. Mild periventricular white matter changes are similar the prior exam. Deep brain nuclei are within normal limits. The brainstem and cerebellum are within normal limits. Vascular: Flow is present in the major intracranial arteries. Skull and upper cervical spine: The craniocervical junction is normal. Upper cervical spine is within normal limits. Marrow signal is unremarkable. Sinuses/Orbits: The paranasal sinuses and mastoid air cells are clear. The globes and orbits are within normal limits. IMPRESSION: 1. No acute intracranial abnormality or significant interval change. 2. Stable mild periventricular white matter disease. This likely reflects the sequela of chronic microvascular ischemia. Electronically Signed   By: San Morelle M.D.   On: 03/28/2022  12:39   CT HEAD CODE STROKE WO CONTRAST  Result Date: 03/28/2022 CLINICAL DATA:  Code stroke. Slurred speech and difficulty writing with right hand. EXAM: CT HEAD WITHOUT CONTRAST TECHNIQUE: Contiguous axial images were obtained from the base of the skull through the vertex without intravenous contrast. RADIATION DOSE REDUCTION: This exam was performed according to the departmental dose-optimization program which includes automated exposure control, adjustment of the mA and/or kV according to patient size and/or use of iterative reconstruction technique. COMPARISON:  Brain MRI 07/03/2014 FINDINGS: Brain: No evidence of acute infarction, hemorrhage, hydrocephalus, extra-axial collection or mass lesion/mass effect. Mild generalized cerebral volume loss Vascular: No focal vessel hyperdensity based on reformats. Skull: Normal. Negative for fracture or focal lesion. Sinuses/Orbits: No acute finding. Other: These results were communicated to Dr. Rory Percy at 12:15 pm on 03/28/2022 by text page via the [facility] messaging system. ASPECTS [facility] Stroke Program Early CT Score) - Ganglionic level infarction (caudate, lentiform nuclei, internal capsule, insula, M1-M3 cortex): 7 - Supraganglionic infarction (M4-M6 cortex): 3 Total score (0-10 with 10 being normal): 10 IMPRESSION: No acute finding.  ASPECTS is 10. Electronically Signed   By: Jorje Guild M.D.   On: 03/28/2022 12:15      Assessment/Plan Principal Problem:   TIA (transient ischemic attack) Active Problems:   Diabetes mellitus without complication (HCC)   CAD (coronary artery disease)   Essential hypertension   Hypothyroidism   Chronic diastolic CHF (congestive heart failure) (HCC)   HLD (hyperlipidemia)   OSA on CPAP   Obesity (BMI 30-39.9)   Assessment and Plan:  TIA (transient ischemic attack): CT-head and MRI-brain negative.  Likely due to TIA. Consulted Dr. Rory Percy.  - Placed on tele bed for observation - will hold oral Bp meds to allow  permissive HTN. Dr. Rory Percy recommend a modified permissive hypertension where treat on a as needed basis if SBP> 180. - swallowing screen. If fails, will get SLP - PT/OT  consult - Continue home Plavix - Continue home statin - CT angiography head and neck - 2D echocardiogram - Hemoglobin A1c, Fasting lipid panel - PT, OT  Diabetes mellitus without complication ([place]): Recent A1c 7.5, poorly controlled.  Patient taking glipizide at home -SSI  CAD (coronary artery disease): No chest pain -Aspirin and Plavix, Lipitor  Essential hypertension -Hold Ziac, Cozaar, Imdur -IV hydralazine as needed for SBP> 180  Hypothyroidism -Daily Synthroid -Every other day liothyroxine  Chronic diastolic CHF (congestive heart failure) ([place]): 2D echo on 02/07/2020 showed EF of 60 to 65% with grade 2 diastolic dysfunction.  Patient does not have leg edema.  No shortness of breath.  No signs of CHF exacerbation -Check BNP  HLD (hyperlipidemia) -Lipitor  OSA - on CPAP  Obesity (BMI 30-39.9): BMI 35.24, body weight 111.4 kg -Exercise, healthy diet -Encourage losing weight        DVT ppx: SQ Lovenox  Code Status: Full code  Family Communication:  Yes, patient's wife   at bed side.  Disposition Plan:  Anticipate discharge back to previous environment  Consults called:  Dr. Rory Percy of neuro  Admission status and Level of care: Telemetry Medical:   for obs    Dispo: The patient is from: Home              Anticipated d/c is to: Home              Anticipated d/c date is: 1 day              Patient currently is not medically stable to d/c.    Severity of Illness:  The appropriate patient status for this patient is OBSERVATION. Observation status is judged to be reasonable and necessary in order to provide the required intensity of service to ensure the patient's safety. The patient's presenting symptoms, physical exam findings, and initial radiographic and laboratory data in the context of  their medical condition is felt to place them at decreased risk for further clinical deterioration. Furthermore, it is anticipated that the patient will be medically stable for discharge from the hospital within 2 midnights of admission.        Date of Service 03/28/2022    Ivor Costa [facility]   If 7PM-7AM, please contact night-coverage [email or web address] 03/28/2022, 2:20 PM

## 2022-03-28 NOTE — Consult Note (Signed)
Neurology Consultation  Reason for Consult: code s stroke Referring Physician: Dr. Quentin Cornwall  CC: Code stroke for word finding difficulty  History is obtained from: Chart, patient  HPI: Tony Lawrence is a 76 y.o. 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  He was last known well at around 1120 this morning when he noticed that he was having a hard time writing a check and when he tried to talk to his wife his speech seemed garbled.  He knew what he wanted to say but the words did not come out.  His wife drove him to the ER.  Code stroke was activated. On my evaluation-see my detailed exam below. No recent illnesses or sicknesses.  Recent change-hypertensive medications adjusted to a higher dose by Dr. Harrell Gave End, who is his cardiologist.  No preceding illnesses or sicknesses   LKW: 11:20 AM IV thrombolysis given?: no, no stroke on MRI, likely hypertensive urgency/emergency EVT: No stroke Premorbid modified Rankin scale (mRS):0  ROS: Full ROS was performed and is negative except as noted in the HPI  Past Medical History:  Diagnosis Date   Actinic keratoses    Allergy    Arthritis    low back pain s/p shots and blocks, knee pain   BCC (basal cell carcinoma of skin)    forehead and chest Dr. Evorn Gong q 6 months    CAD (coronary artery disease)    Complication of anesthesia    SEVERE SORE THROAT AFTER BIOPSY 2010   Coronary artery disease    Chronic total occlusion of mid LAD   COVID-19    11/17/20   Diabetes mellitus without complication (HCC)    GERD (gastroesophageal reflux disease)    History of chicken pox    History of shingles    Hyperlipidemia    Hypertension    Hypothyroidism     OSA on CPAP    SCC (squamous cell carcinoma)    invasive left Post. lateral Tongue [facility] Dr. Ihor Austin excised 08/25/20 surgical exc [facility] ?recurrent vs new primary    Squamous cell carcinoma in situ    nose 2021    Tongue cancer ([place])    Squamous cell CA of tongue 11/11/15 no chemo or radiation ENT Dr. Tami Ribas, [facility] H/[facility]      Family History  Problem Relation Age of Onset   Myelodysplastic syndrome Mother    Arthritis Mother    Emphysema Father    Alcohol abuse Father    COPD Father    Depression Father    Early death Maternal Grandfather    Heart disease Maternal Grandfather    Stroke Maternal Grandfather      Social History:   reports that he has never smoked. He has never used smokeless tobacco. He reports that he does not drink alcohol and does not use drugs.  Medications  Current Facility-Administered Medications:    sodium chloride flush (NS) 0.9 % injection 3 mL, 3 mL, Intravenous, Once, Merlyn Lot, MD  Current Outpatient Medications:    Acetaminophen (TYLENOL PO), Take 1,300 mg by mouth every other day., Disp: , Rfl:    atorvastatin (LIPITOR) 20 MG tablet, Take 1 tablet (20 mg total) by mouth daily at 6 PM. At night, Disp: 90 tablet, Rfl: 3  bisoprolol-hydrochlorothiazide (ZIAC) 2.5-6.25 MG tablet, Take 1 tablet by mouth daily., Disp: 90 tablet, Rfl: 3   clopidogrel (PLAVIX) 75 MG tablet, TAKE 1 TABLET BY MOUTH DAILY, Disp: 90 tablet, Rfl: 0   co-enzyme Q-10 30 MG capsule, Take 30 mg by mouth 3 (three) times daily., Disp: , Rfl:    glimepiride (AMARYL) 1 MG tablet, Take 1 tablet (1 mg total) by mouth daily with breakfast., Disp: 90 tablet, Rfl: 3   glucose blood test strip, E 11.9 qd Once Daily one touch, Disp: 100 each, Rfl: 12   isosorbide mononitrate (IMDUR) 30 MG 24 hr tablet, TAKE ONE TABLET BY MOUTH EVERY MORNING AND TAKE TWO TABLETS BY MOUTH EVERY EVENING, Disp: 270 tablet, Rfl: 0   Lancets (ONETOUCH ULTRASOFT) lancets, Qd E 11.9 Once daily, Disp: 100 each, Rfl:  12   levothyroxine (SYNTHROID) 200 MCG tablet, Take 1 tablet (200 mcg total) by mouth daily. In am on empty stomach, Disp: 90 tablet, Rfl: 3   liothyronine (CYTOMEL) 5 MCG tablet, Take 0.5 tablets (2.5 mcg total) by mouth every other day., Disp: 45 tablet, Rfl: 3   losartan (COZAAR) 50 MG tablet, Take 1 tablet (50 mg total) by mouth in the morning and at bedtime. Stop ramipril 20 mg qd, d/c losartan 25 mg qd, Disp: 180 tablet, Rfl: 3   Multiple Vitamins-Minerals (CENTRUM SILVER 50+MEN PO), Take 1 tablet by mouth every morning. , Disp: , Rfl:    pantoprazole (PROTONIX) 40 MG tablet, Take 1 tablet (40 mg total) by mouth daily., Disp: 90 tablet, Rfl: 3  Exam: Current vital signs: BP (!) 208/55 (BP Location: Right Arm)   Pulse 64   Temp 98.3 F (36.8 C) (Oral)   Resp 18   Ht '5\' 10"'$  (1.778 m)   Wt 111.4 kg   SpO2 97%   BMI 35.24 kg/m  Vital signs in last 24 hours: Temp:  [98.3 F (36.8 C)] 98.3 F (36.8 C) (12/03 1152) Pulse Rate:  [64] 64 (12/03 1152) Resp:  [18] 18 (12/03 1152) BP: (208-219)/(55-80) 208/55 (12/03 1211) SpO2:  [97 %] 97 % (12/03 1152) Weight:  [111.4 kg] 111.4 kg (12/03 1149)  GENERAL: Awake, alert in NAD HEENT: - Normocephalic and atraumatic, dry mm, no LN++, no Thyromegally LUNGS - Clear to auscultation bilaterally with no wheezes CV - S1S2 RRR, no m/r/g, equal pulses bilaterally. ABDOMEN - Soft, nontender, nondistended with normoactive BS Ext: warm, well perfused, intact peripheral pulses, no edema  NEURO:  Mental Status: AA&Ox3  Language: speech is nondysarthric.naming comprehension repetition is intact but on longer sentences he does have 1 or 2 paraphasic errors/word substitutions. Cranial Nerves: PERRL EOMI, visual fields full, no facial asymmetry, facial sensation intact, hearing intact, tongue/uvula/soft palate midline, normal sternocleidomastoid and trapezius muscle strength. No evidence of tongue atrophy or fibrillations Motor: 5/5 without drift in all  fours Tone: is normal and bulk is normal Sensation- Intact to light touch bilaterally Coordination: FTN intact bilaterally, no ataxia in BLE. Gait- deferred  NIHSS-1 for aphasia  Labs I have reviewed labs in epic and the results pertinent to this consultation are:  CBC    Component Value Date/Time   WBC 7.5 03/28/2022 1154   RBC 5.19 03/28/2022 1154   HGB 15.6 03/28/2022 1154   HGB 16.2 01/11/2018 1116   HCT 48.2 03/28/2022 1154   HCT 49.0 01/11/2018 1116   PLT 162 03/28/2022 1154   PLT 199 01/11/2018 1116   MCV 92.9 03/28/2022 1154   MCV 89 01/11/2018 1116  MCH 30.1 03/28/2022 1154   MCHC 32.4 03/28/2022 1154   RDW 13.6 03/28/2022 1154   RDW 13.0 01/11/2018 1116   LYMPHSABS 2.2 03/28/2022 1154   LYMPHSABS 2.3 01/11/2018 1116   MONOABS 0.6 03/28/2022 1154   EOSABS 0.1 03/28/2022 1154   EOSABS 0.1 01/11/2018 1116   BASOSABS 0.0 03/28/2022 1154   BASOSABS 0.0 01/11/2018 1116    CMP     Component Value Date/Time   NA 139 01/25/2022 0804   NA 142 12/21/2017 1144   K 4.2 01/25/2022 0804   CL 102 01/25/2022 0804   CO2 28 01/25/2022 0804   GLUCOSE 132 (H) 01/25/2022 0804   BUN 11 01/25/2022 0804   BUN 17 12/21/2017 1144   CREATININE 1.00 01/25/2022 0804   CREATININE 1.17 12/10/2020 1400   CALCIUM 8.5 01/25/2022 0804   PROT 6.6 01/25/2022 0804   ALBUMIN 3.8 01/25/2022 0804   AST 21 01/25/2022 0804   ALT 21 01/25/2022 0804   ALKPHOS 84 01/25/2022 0804   BILITOT 0.9 01/25/2022 0804   GFRNONAA >60 01/05/2018 0810   GFRAA >60 01/05/2018 0810    Lipid Panel     Component Value Date/Time   CHOL 93 01/25/2022 0804   TRIG 213.0 (H) 01/25/2022 0804   HDL 27.60 (L) 01/25/2022 0804   CHOLHDL 3 01/25/2022 0804   VLDL 42.6 (H) 01/25/2022 0804   LDLCALC 28 09/28/2021 0924   LDLDIRECT 31.0 01/25/2022 0804     Imaging I have reviewed the images obtained:  CT-head-aspects 10. Stat MRI of the brain: Preliminary review of DWI images negative for acute stroke.   Formal read pending  Assessment:  76 year old presents to the emergency room for sudden onset of word finding difficulty, and difficulty writing checks.  This started suddenly at 11:20 AM.  He is within the window for IV intervention.  I discussed with him after the head CT was negative for acute process that we can either give him IV TNKase in case this is a small stroke versus do an MRI to ensure that it truly is a stroke versus hypertensive urgency because he also came in with systolics of over 540J.  He opted for the latter, we went for the stat MRI.  Preliminary review of DWI images negative for acute stroke. I still think his symptoms although in the setting of hypertension were very cortical and could reflect a left hemispheric TIA and he would benefit from further workup.   Impression Hypertensive emergency/urgency Evaluate for TIA  Recommendations: Admit to hospitalist Frequent neurochecks Continue home Plavix Continue home statin Continue telemetry CT angiography head and neck 2D echocardiogram Hemoglobin A1c Fasting lipid panel PT OT Speech therapy Blood pressure goal: Given that both hypertensive urgency as well as TIA are in the differentials, I would recommend a modified permissive hypertension where treat on a as needed basis if his systolics are greater than 180 at this time. Plan was discussed with Dr. Quentin Cornwall Inpatient neurology will follow the above test with you. Please do not history to call with questions.  -- Amie Portland, MD Neurologist [facility] Pager: [phone]

## 2022-03-29 ENCOUNTER — Encounter: Payer: Self-pay | Admitting: Internal Medicine

## 2022-03-29 ENCOUNTER — Observation Stay (HOSPITAL_BASED_OUTPATIENT_CLINIC_OR_DEPARTMENT_OTHER)
Admit: 2022-03-29 | Discharge: 2022-03-29 | Disposition: A | Discharge status: Home / Self Care | Payer: Medicare Other | Attending: Internal Medicine | Admitting: Internal Medicine

## 2022-03-29 DIAGNOSIS — G459 Transient cerebral ischemic attack, unspecified: Secondary | ICD-10-CM | POA: Diagnosis not present

## 2022-03-29 LAB — ECHOCARDIOGRAM COMPLETE
AR max vel: 3.26 cm2
AV Area VTI: 3.97 cm2
AV Area mean vel: 3.31 cm2
AV Mean grad: 3 mmHg
AV Peak grad: 5.7 mmHg
Ao pk vel: 1.19 m/s
Area-P 1/2: 2.14 cm2
Height: 70 in
S' Lateral: 2.1 cm
Weight: 3929.48 oz

## 2022-03-29 LAB — LIPID PANEL
Cholesterol: 96 mg/dL (ref 0–200)
HDL: 24 mg/dL — ABNORMAL LOW (ref >40)
LDL Cholesterol: 31 mg/dL (ref 0–99)
Total CHOL/HDL Ratio: 4 RATIO
Triglycerides: 203 mg/dL — ABNORMAL HIGH (ref <150)
VLDL: 41 mg/dL — ABNORMAL HIGH (ref 0–40)

## 2022-03-29 LAB — CBG MONITORING, ED: Glucose-Capillary: 141 mg/dL — ABNORMAL HIGH (ref 70–99)

## 2022-03-29 LAB — GLUCOSE, CAPILLARY: Glucose-Capillary: 208 mg/dL — ABNORMAL HIGH (ref 70–99)

## 2022-03-29 MED ORDER — ASPIRIN 81 MG PO TBEC
81.0000 mg | DELAYED_RELEASE_TABLET | Freq: Every day | ORAL | Status: DC
  Administered 2022-03-29: 81 mg via ORAL
  Filled 2022-03-29: qty 1

## 2022-03-29 MED ORDER — ASPIRIN 81 MG PO TBEC: 81.0000 mg | DELAYED_RELEASE_TABLET | Freq: Every day | ORAL | 0 refills | Status: AC

## 2022-03-29 NOTE — ED Notes (Signed)
Informed RN bed assigned 

## 2022-03-29 NOTE — ED Notes (Signed)
1000 meds given and tolerated well. Pt vitals stable. Will continue to monitor.

## 2022-03-29 NOTE — Progress Notes (Signed)
SLP Cancellation Note  Patient Details Name: Tony Lawrence MRN: [id number] DOB: 30-Oct-1945   Cancelled treatment:       Reason Eval/Treat Not Completed: SLP screened, no needs identified, will sign off (chart reviewed; consulted NSG then met w/ pt and Wife in room) Pt denied any difficulty swallowing and is currently on a regular diet; tolerates swallowing pills w/ water per NSG. Pt conversed in conversation w/out expressive/receptive deficits noted; pt denied any speech-language deficits. Speech clear, fully intelligible. Good sense of humor; A/O x4. No further skilled ST services indicated as pt appears at his baseline. Pt agreed. NSG to reconsult if any change in status while admitted.      Orinda Kenner, MS, CCC-SLP Speech Language Pathologist Rehab Services; [place] [phone] (ascom) Tiara Maultsby 03/29/2022, 12:44 PM

## 2022-03-29 NOTE — Progress Notes (Signed)
*  PRELIMINARY RESULTS* Echocardiogram 2D Echocardiogram has been performed.  Tony Lawrence 03/29/2022, 9:26 AM

## 2022-03-29 NOTE — Evaluation (Signed)
Physical Therapy Evaluation Patient Details Name: Tony Lawrence MRN: [id number] DOB: 05-Jul-1945 Today's Date: 03/29/2022  History of Present Illness  Pt is a 76 y.o. male presenting to hospital 03/28/22 with c/o sudden onset difficulty with speech and difficulty writing checks.  Pt admitted with TIA (CT of head and MRI of brain negative).  PMH includes BCC of skin, SCC of tongue s/p radiation and sx, htn, HLD, CAD, sleep apnea on CPAP, LBP, knee pain.  Clinical Impression  Prior to hospital admission, pt was independent with functional mobility; lives with his wife (can stay on main level).  No word finding difficulty noted during session.  Intact B LE strength, light touch, heel to shin coordination, and proprioception.  Currently pt is modified independent with bed mobility; independent with transfers; and independent to ambulate 200 feet (no AD use).  No loss of balance noted with dynamic standing activities.  No acute PT needs identified; PT will sign off at this time.    Recommendations for follow up therapy are one component of a multi-disciplinary discharge planning process, led by the attending physician.  Recommendations may be updated based on patient status, additional functional criteria and insurance authorization.  Follow Up Recommendations No PT follow up      Assistance Recommended at Discharge None  Patient can return home with the following       Equipment Recommendations None recommended by PT  Recommendations for Other Services       Functional Status Assessment Patient has not had a recent decline in their functional status     Precautions / Restrictions Precautions Precautions: None Restrictions Weight Bearing Restrictions: No      Mobility  Bed Mobility Overal bed mobility: Modified Independent             General bed mobility comments: HOB elevated; no difficulties noted semi-supine to/from sitting    Transfers Overall transfer level:  Independent Equipment used: None               General transfer comment: steady safe transfers noted    Ambulation/Gait Ambulation/Gait assistance: Independent Gait Distance (Feet): 200 Feet Assistive device: None Gait Pattern/deviations: WFL(Within Functional Limits)       General Gait Details: steady ambulation  Stairs Stairs:  (Deferred d/t being in ED but no concerns anticipated)          Wheelchair Mobility    Modified Rankin (Stroke Patients Only)       Balance Overall balance assessment: Needs assistance, Independent Sitting-balance support: No upper extremity supported, Feet supported Sitting balance-Leahy Scale: Normal Sitting balance - Comments: steady sitting reaching outside BOS   Standing balance support: No upper extremity supported, During functional activity Standing balance-Leahy Scale: Normal Standing balance comment: no loss of balance with ambulation and head turns R/L/up/down, increasing/decreasing speed, and turning 180 degrees and stopping                             Pertinent Vitals/Pain Pain Assessment Pain Assessment: No/denies pain Vitals stable and WFL throughout treatment session.    Home Living Family/patient expects to be discharged to:: Private residence Living Arrangements: Spouse/significant other Available Help at Discharge: Family;Available PRN/intermittently Type of Home: House Home Access: Stairs to enter Entrance Stairs-Rails: Right Entrance Stairs-Number of Steps: 5   Home Layout: Two level;Able to live on main level with bedroom/bathroom Home Equipment: Rolling Walker (2 wheels);Cane - single point;Toilet riser      Prior Function  Prior Level of Function : Independent/Modified Independent             Mobility Comments: No recent falls.       Hand Dominance        Extremity/Trunk Assessment   Upper Extremity Assessment Upper Extremity Assessment: Defer to OT evaluation (strong B hand  grip strength; at least 4+/5 B elbow flexion/extension and shoulder flexion)    Lower Extremity Assessment Lower Extremity Assessment:  (at least 4+/5 B hip flexion, knee flexion/extension, and DF/PF AROM; intact B LE light touch, proprioception, tone, and heel to shin coordination)    Cervical / Trunk Assessment Cervical / Trunk Assessment: Normal  Communication   Communication: No difficulties  Cognition Arousal/Alertness: Awake/alert Behavior During Therapy: WFL for tasks assessed/performed Overall Cognitive Status: Within Functional Limits for tasks assessed                                          General Comments  Nursing cleared pt for participation in physical therapy.  Pt agreeable to PT session.  Pt's wife arrived during session.    Exercises     Assessment/Plan    PT Assessment Patient does not need any further PT services  PT Problem List         PT Treatment Interventions      PT Goals (Current goals can be found in the Care Plan section)  Acute Rehab PT Goals Patient Stated Goal: to go home when medically ready PT Goal Formulation: With patient Time For Goal Achievement: 04/12/22 Potential to Achieve Goals: Good    Frequency       Co-evaluation               AM-PAC PT "6 Clicks" Mobility  Outcome Measure Help needed turning from your back to your side while in a flat bed without using bedrails?: None Help needed moving from lying on your back to sitting on the side of a flat bed without using bedrails?: None Help needed moving to and from a bed to a chair (including a wheelchair)?: None Help needed standing up from a chair using your arms (e.g., wheelchair or bedside chair)?: None Help needed to walk in hospital room?: None Help needed climbing 3-5 steps with a railing? : None 6 Click Score: 24    End of Session   Activity Tolerance: Patient tolerated treatment well Patient left: in bed;with family/visitor present;Other  (comment);with call bell/phone within reach (Pt's wife and neurologist present; ECHO staff also present.) Nurse Communication: Mobility status;Precautions PT Visit Diagnosis: Other symptoms and signs involving the nervous system (V77.939)    Time: 0300-9233 PT Time Calculation (min) (ACUTE ONLY): 20 min   Charges:   PT Evaluation $PT Eval Low Complexity: 1 Low          Pulpotio Bareas, PT 03/29/22, 11:09 AM

## 2022-03-29 NOTE — Progress Notes (Signed)
Subjective: Feels back to normal.   Exam: Vitals:   03/29/22 0622 03/29/22 0714  BP:  (!) 140/58  Pulse:  (!) 52  Resp:  16  Temp: 97.8 F (36.6 C)   SpO2:  95%   Gen: In bed, NAD Resp: non-labored breathing, no acute distress Abd: soft, nt  Neuro: MS: awake, alert, interactive and appropriate.  AX:ENMM, VFF, face symmetric Motor: no drift Sensory:intact to LT  Pertinent Labs: LDL 31 Glucose 141 MRI nregative CTA head and Neck - No significant stenosis  Impression: 76 yo M with transient confusion and speech changes concerning for TIA. With no significant stenosis on CTA and no stroke on MRI, normal BP management with goal normotension. Hypertensive emergency is also possible as opposed to TIA, and so BP control would also be important from this standpoint.   Recommendations: 1) Continue home plavix.  2) add asa '81mg'$  daily x 3 weeks, then resume plavix monotherapy 3) Continue current lipid management(LDL at goal) 4) DM management with goal A1C < 7 5) BP control, goal normotension 6) Continue tele, echo 7) If no embolic source on echo/tele, can follow up with PCP  Roland Rack, MD [facility] [phone]  If 7pm- 7am, please page neurology on call as listed in [place].

## 2022-03-29 NOTE — Evaluation (Signed)
Occupational Therapy Evaluation Patient Details Name: Tony Lawrence MRN: [id number] DOB: 11-08-1945 Today's Date: 03/29/2022   History of Present Illness Pt is a 76 y.o. male presenting to hospital 03/28/22 with c/o sudden onset difficulty with speech and difficulty writing checks.  Pt admitted with TIA (CT of head and MRI of brain negative).  PMH includes BCC of skin, SCC of tongue s/p radiation and sx, htn, HLD, CAD, sleep apnea on CPAP, LBP, knee pain.   Clinical Impression   Upon entering the room, pt supine in bed and agreeable to OT assessment. Pt's wife also present in room. Patient reports being Ind at baseline and being active in the yard and community. He expressed looking forward to Christmas because his children and grandchildren would be there.  Patient currently functioning Ind for self care and mobility. Fine motor coordination was WNLs and pt demonstrates ability to write answers to orientation questions and utilize cell phone without issue. Pt and wife report feeling that he has returned to baseline at this time. Pt with no skilled need for OT intervention. OT to complete order. Thank you.     Recommendations for follow up therapy are one component of a multi-disciplinary discharge planning process, led by the attending physician.  Recommendations may be updated based on patient status, additional functional criteria and insurance authorization.   Follow Up Recommendations  No OT follow up     Assistance Recommended at Discharge None     Functional Status Assessment  Patient has not had a recent decline in their functional status  Equipment Recommendations  None recommended by OT    Recommendations for Other Services       Precautions / Restrictions Precautions Precautions: None Restrictions Weight Bearing Restrictions: No      Mobility Bed Mobility Overal bed mobility: Modified Independent                  Transfers Overall transfer level:  Independent Equipment used: None                      Balance Overall balance assessment: Independent Sitting-balance support: No upper extremity supported, Feet supported Sitting balance-Leahy Scale: Normal     Standing balance support: No upper extremity supported, During functional activity Standing balance-Leahy Scale: Normal                             ADL either performed or assessed with clinical judgement   ADL Overall ADL's : At baseline;Independent                                             Vision Patient Visual Report: No change from baseline              Pertinent Vitals/Pain Pain Assessment Pain Assessment: No/denies pain     Hand Dominance Right   Extremity/Trunk Assessment Upper Extremity Assessment Upper Extremity Assessment: Overall WFL for tasks assessed   Lower Extremity Assessment Lower Extremity Assessment: Overall WFL for tasks assessed   Cervical / Trunk Assessment Cervical / Trunk Assessment: Normal   Communication Communication Communication: No difficulties   Cognition Arousal/Alertness: Awake/alert Behavior During Therapy: WFL for tasks assessed/performed Overall Cognitive Status: Within Functional Limits for tasks assessed  Home Living Family/patient expects to be discharged to:: Private residence Living Arrangements: Spouse/significant other Available Help at Discharge: Family;Available PRN/intermittently Type of Home: House Home Access: Stairs to enter [employer] of Steps: 5 Entrance Stairs-Rails: Right Home Layout: Two level;Able to live on main level with bedroom/bathroom     Bathroom Shower/Tub: Tub/shower unit         Home Equipment: Conservation officer, nature (2 wheels);Cane - single point;Toilet riser          Prior Functioning/Environment Prior Level of Function : Independent/Modified Independent              Mobility Comments: No recent falls.                   OT Goals(Current goals can be found in the care plan section) Acute Rehab OT Goals Patient Stated Goal: to return home OT Goal Formulation: With patient/family Time For Goal Achievement: 03/29/22 Potential to Achieve Goals: Good   AM-PAC OT "6 Clicks" Daily Activity     Outcome Measure Help from another person eating meals?: None Help from another person taking care of personal grooming?: None Help from another person toileting, which includes using toliet, bedpan, or urinal?: None Help from another person bathing (including washing, rinsing, drying)?: None Help from another person to put on and taking off regular upper body clothing?: None Help from another person to put on and taking off regular lower body clothing?: None 6 Click Score: 24   End of Session Nurse Communication: Mobility status  Activity Tolerance: Patient tolerated treatment well Patient left: in bed;with call bell/phone within reach;with family/visitor present                   Time: 2202-5427 OT Time Calculation (min): 12 min Charges:  OT General Charges $OT Visit: 1 Visit OT Evaluation $OT Eval Low Complexity: 1 Low Darleen Crocker, MS, OTR/L , CBIS ascom [phone]  03/29/22, 1:38 PM

## 2022-03-29 NOTE — ED Notes (Signed)
Pt A&Ox4. Pt speech is clear and not mumbled or slurred. R hand movement normal and grip normal.

## 2022-03-29 NOTE — TOC Initial Note (Signed)
Transition of Care [facility]) - Initial/Assessment Note    Patient Details  Name: Tony Lawrence MRN: [id number] Date of Birth: 1945/10/08  Transition of Care [facility]) CM/SW Contact:    Shelbie Hutching, RN Phone Number: 03/29/2022, 11:24 AM  Clinical Narrative:                  Transition of Care (TOC) Screening Note   Patient Details  Name: Tony Lawrence Date of Birth: 09-28-1945   Transition of Care [facility]) CM/SW Contact:    Shelbie Hutching, RN Phone Number: 03/29/2022, 11:25 AM    Transition of Care Department [facility]) has reviewed patient and no TOC needs have been identified at this time. We will continue to monitor patient advancement through interdisciplinary progression rounds. If new patient transition needs arise, please place a TOC consult.          Patient Goals and CMS Choice        Expected Discharge Plan and Services                                                Prior Living Arrangements/Services                       Activities of Daily Living      Permission Sought/Granted                  Emotional Assessment              Admission diagnosis:  TIA (transient ischemic attack) [G45.9] Patient Active Problem List   Diagnosis Date Noted   TIA (transient ischemic attack) 03/28/2022   Diabetes mellitus without complication ([place]) 61/44/3154   Chronic diastolic CHF (congestive heart failure) ([place]) 03/28/2022   HLD (hyperlipidemia) 03/28/2022   CAD (coronary artery disease) 03/28/2022   Aortic valve regurgitation 03/27/2022   Aneurysm of ascending aorta without rupture ([place]) 03/27/2022   Thrombocytopenia ([place]) 01/27/2022   Chronic diarrhea 12/29/2021   Valvular heart disease 03/23/2021   Onychomycosis 02/27/2021   Contusion of toenail 02/27/2021   Annual physical exam 02/27/2021   Centrilobular emphysema ([place]) 02/25/2021   Nodule of right lung 02/25/2021   Hypertension associated with diabetes ([place]) 11/18/2020    SCC (squamous cell carcinoma)    Tongue cancer ([place])    Mitral valve insufficiency 12/22/2019   Morbid obesity ([place]) 12/22/2019   Obesity (BMI 30-39.9) 11/15/2019   H/O total knee replacement, 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   Arthritis of right knee 11/15/2019   Benign prostatic hyperplasia with nocturia 11/15/2019   Squamous cell carcinoma in situ    Actinic keratoses    Erectile dysfunction due to arterial insufficiency 04/24/2019   Primary osteoarthritis of left knee 11/22/2018   Gastroesophageal reflux disease without esophagitis 03/21/2018   History of skin cancer 03/21/2018   History of tongue cancer 03/21/2018   OSA on CPAP 03/21/2018   Diarrhea 03/21/2018   Hypothyroidism 03/21/2018   Chronic low back pain 03/14/2018   Coronary artery disease of native artery of native heart with stable angina pectoris ([place]) 12/22/2017   Essential hypertension 12/22/2017   Hyperlipidemia associated with type 2 diabetes mellitus ([place]) 12/22/2017   Central obesity 03/01/2016   Cancer of ventral surface of tongue ([place]) 11/04/2015   Malignant neoplasm of tongue, tip and lateral border ([place]) 11/04/2015   Primary osteoarthritis  of both knees 05/13/2014   Other intervertebral disc displacement, lumbar region 01/16/2014   Neuritis or radiculitis due to rupture of lumbar intervertebral disc 01/16/2014   Parapelvic renal cyst 12/18/2013   Flank pain 11/30/2013   Arthritis, degenerative 03/12/2013   Diabetes mellitus ([place]) 03/12/2013   Increased frequency of urination 03/12/2013   Slowing of urinary stream 03/12/2013   Urinary hesitancy 03/12/2013   Benign prostatic hyperplasia with urinary obstruction 03/06/2012   PCP:  McLean-Scocuzza, Nino Glow, MD Pharmacy:   [facility] [id number], [place] [address] Phone: [phone] Fax: [phone]     Social Determinants of Health (SDOH) Interventions    Readmission Risk Interventions     No  data to display

## 2022-03-29 NOTE — Care Management Obs Status (Signed)
[place] NOTIFICATION   Patient Details  Name: Tony Lawrence MRN: [id number] Date of Birth: 1945-07-09   Medicare Observation Status Notification Given:  Yes    Shelbie Hutching, RN 03/29/2022, 11:18 AM

## 2022-03-29 NOTE — Assessment & Plan Note (Deleted)
Recent diagnosis of type 2 diabetes, with A1c of 6.5% in August.  Not yet started on medications. --Pt wants to start metformin at low dose --Started metformin 500 mg BID WC --Close PCP follow up recommended --Dietitian consulted at patient's request --Mild hyperglycemia on BMP due to steroids --Defer insulin coverage unless fasting glucose > 180 consistently

## 2022-03-29 NOTE — ED Notes (Signed)
Pt 0800 meds given and tolerated well. Physical Therapy at bedside.

## 2022-03-29 NOTE — Discharge Summary (Signed)
Physician Discharge Summary   Patient: Tony Lawrence MRN: [id number] DOB: [birth year]  Admit date:     03/28/2022  Discharge date: 03/29/22  Discharge Physician: Ezekiel Slocumb   PCP: McLean-Scocuzza, Nino Glow, MD   Recommendations at discharge:  {Tip this will not be part of the note when signed- Example include specific recommendations for outpatient follow-up, pending tests to follow-up on. (Optional):26781} Follow up with Primary care in 1-2 weeks Follow up with Cardiology as scheduled Continue Aspirin + Plavix x 21 days, then resume Plavix alone Monitor BP with goal of normo-tension.  BP on admission was systolic above 127.    Discharge Diagnoses: Principal Problem:   TIA (transient ischemic attack) Active Problems:   Diabetes mellitus without complication (HCC)   CAD (coronary artery disease)   Essential hypertension   Hypothyroidism   Chronic diastolic CHF (congestive heart failure) (HCC)   HLD (hyperlipidemia)   OSA on CPAP   Obesity (BMI 30-39.9)  Resolved Problems:   * No resolved hospital problems. [facility] Course: No notes on file  Assessment and Plan: No notes have been filed under this hospital service. Service: Hospitalist     {Tip this will not be part of the note when signed Body mass index is 35.24 kg/m. , ,  (Optional):26781}  {(NOTE) Pain control PDMP Statment (Optional):26782} Consultants: *** Procedures performed: ***  Disposition: {Plan; Disposition:26390} Diet recommendation:  Discharge Diet Orders (From admission, onward)     Start     Ordered   03/29/22 0000  Diet - low sodium heart healthy        03/29/22 1321           {Diet_Plan:26776} DISCHARGE MEDICATION: Allergies as of 03/29/2022       Reactions   Penicillins Hives, Rash, Other (See Comments), Swelling   Has patient had a PCN reaction causing immediate rash, facial/tongue/throat swelling, SOB or lightheadedness with hypotension: Yes Has patient had a PCN  reaction causing severe rash involving mucus membranes or skin necrosis: No Has patient had a PCN reaction that required hospitalization No Has patient had a PCN reaction occurring within the last 10 years: No If all of the above answers are "NO", then may proceed with Cephalosporin use.        Medication List     TAKE these medications    aspirin EC 81 MG tablet Take 1 tablet (81 mg total) by mouth daily for 21 days. Swallow whole. Start taking on: March 30, 2022   atorvastatin 20 MG tablet Commonly known as: LIPITOR Take 1 tablet (20 mg total) by mouth daily at 6 PM. At night   bisoprolol-hydrochlorothiazide 2.5-6.25 MG tablet Commonly known as: ZIAC Take 1 tablet by mouth daily.   CENTRUM SILVER 50+MEN PO Take 1 tablet by mouth every morning.   clopidogrel 75 MG tablet Commonly known as: PLAVIX TAKE 1 TABLET BY MOUTH DAILY   co-enzyme Q-10 30 MG capsule Take 30 mg by mouth 3 (three) times daily.   glimepiride 1 MG tablet Commonly known as: AMARYL Take 1 tablet (1 mg total) by mouth daily with breakfast.   glucose blood test strip E 11.9 qd Once Daily one touch   isosorbide mononitrate 30 MG 24 hr tablet Commonly known as: IMDUR TAKE ONE TABLET BY MOUTH EVERY MORNING AND TAKE TWO TABLETS BY MOUTH EVERY EVENING   levothyroxine 200 MCG tablet Commonly known as: SYNTHROID Take 1 tablet (200 mcg total) by mouth daily. In am on empty stomach  liothyronine 5 MCG tablet Commonly known as: CYTOMEL Take 0.5 tablets (2.5 mcg total) by mouth every other day.   losartan 50 MG tablet Commonly known as: COZAAR Take 1 tablet (50 mg total) by mouth in the morning and at bedtime. Stop ramipril 20 mg qd, d/c losartan 25 mg qd   onetouch ultrasoft lancets Qd E 11.9 Once daily   pantoprazole 40 MG tablet Commonly known as: PROTONIX Take 1 tablet (40 mg total) by mouth daily.   TYLENOL PO Take 1,300 mg by mouth every other day.        Discharge Exam: Filed  Weights   03/28/22 1149  Weight: 111.4 kg   ***  Condition at discharge: {DC Condition:26389}  The results of significant diagnostics from this hospitalization (including imaging, microbiology, ancillary and laboratory) are listed below for reference.   Imaging Studies: ECHOCARDIOGRAM COMPLETE  Result Date: 03/29/2022    ECHOCARDIOGRAM REPORT   Patient Name:   Tony Lawrence Date of Exam: 03/29/2022 Medical Rec #:  [id number]       Height:       70.0 in Accession #:    [id number]      Weight:       245.6 lb Date of Birth:  19-Sep-1945       BSA:          2.278 m Patient Age:    76 years        BP:           148/63 mmHg Patient Gender: M               HR:           52 bpm. Exam Location:  [facility] Procedure: 2D Echo, Cardiac Doppler and Color Doppler Indications:     TIA G45.9  History:         Patient has prior history of Echocardiogram examinations, most                  recent 02/07/2020. CAD; Risk Factors:Hypertension and Diabetes.  Sonographer:     Sherrie Sport Referring Phys:  [place] Diagnosing Phys: Kathlyn Sacramento MD IMPRESSIONS  1. Left ventricular ejection fraction, by estimation, is 55 to 60%. The left ventricle has normal function. The left ventricle has no regional wall motion abnormalities. There is mild left ventricular hypertrophy. Left ventricular diastolic parameters are consistent with Grade I diastolic dysfunction (impaired relaxation).  2. Right ventricular systolic function is normal. The right ventricular size is normal. Tricuspid regurgitation signal is inadequate for assessing PA pressure.  3. Left atrial size was mildly dilated.  4. The mitral valve is normal in structure. No evidence of mitral valve regurgitation. No evidence of mitral stenosis. Moderate mitral annular calcification.  5. The aortic valve is normal in structure. Aortic valve regurgitation is trivial. Aortic valve sclerosis/calcification is present, without any evidence of aortic stenosis. FINDINGS  Left  Ventricle: Left ventricular ejection fraction, by estimation, is 55 to 60%. The left ventricle has normal function. The left ventricle has no regional wall motion abnormalities. The left ventricular internal cavity size was normal in size. There is  mild left ventricular hypertrophy. Left ventricular diastolic parameters are consistent with Grade I diastolic dysfunction (impaired relaxation). Right Ventricle: The right ventricular size is normal. No increase in right ventricular wall thickness. Right ventricular systolic function is normal. Tricuspid regurgitation signal is inadequate for assessing PA pressure. Left Atrium: Left atrial size was mildly dilated. Right Atrium: Right atrial  size was normal in size. Pericardium: There is no evidence of pericardial effusion. Mitral Valve: The mitral valve is normal in structure. Moderate mitral annular calcification. No evidence of mitral valve regurgitation. No evidence of mitral valve stenosis. Tricuspid Valve: The tricuspid valve is normal in structure. Tricuspid valve regurgitation is not demonstrated. No evidence of tricuspid stenosis. Aortic Valve: The aortic valve is normal in structure. Aortic valve regurgitation is trivial. Aortic valve sclerosis/calcification is present, without any evidence of aortic stenosis. Aortic valve mean gradient measures 3.0 mmHg. Aortic valve peak gradient measures 5.7 mmHg. Aortic valve area, by VTI measures 3.97 cm. Pulmonic Valve: The pulmonic valve was normal in structure. Pulmonic valve regurgitation is not visualized. No evidence of pulmonic stenosis. Aorta: The aortic root is normal in size and structure. Venous: The inferior vena cava was not well visualized. IAS/Shunts: No atrial level shunt detected by color flow Doppler.  LEFT VENTRICLE PLAX 2D LVIDd:         2.90 cm   Diastology LVIDs:         2.10 cm   LV e' medial:    6.96 cm/s LV PW:         1.00 cm   LV E/e' medial:  13.1 LV IVS:        1.30 cm   LV e' lateral:   9.14  cm/s LVOT diam:     2.20 cm   LV E/e' lateral: 10.0 LV SV:         97 LV SV Index:   43 LVOT Area:     3.80 cm  RIGHT VENTRICLE RV Basal diam:  2.90 cm RV Mid diam:    3.00 cm RV S prime:     12.50 cm/s TAPSE (M-mode): 2.8 cm LEFT ATRIUM           Index        RIGHT ATRIUM           Index LA diam:      1.90 cm 0.83 cm/m   RA Area:     19.40 cm LA Vol (A2C): 65.9 ml 28.93 ml/m  RA Volume:   50.30 ml  22.08 ml/m LA Vol (A4C): 42.9 ml 18.83 ml/m  AORTIC VALVE AV Area (Vmax):    3.26 cm AV Area (Vmean):   3.31 cm AV Area (VTI):     3.97 cm AV Vmax:           119.00 cm/s AV Vmean:          82.800 cm/s AV VTI:            0.244 m AV Peak Grad:      5.7 mmHg AV Mean Grad:      3.0 mmHg LVOT Vmax:         102.00 cm/s LVOT Vmean:        72.200 cm/s LVOT VTI:          0.255 m LVOT/AV VTI ratio: 1.05  AORTA Ao Root diam: 3.40 cm MITRAL VALVE                TRICUSPID VALVE MV Area (PHT): 2.14 cm     TR Peak grad:   11.8 mmHg MV Decel Time: 354 msec     TR Vmax:        172.00 cm/s MV E velocity: 91.20 cm/s MV A velocity: 138.00 cm/s  SHUNTS MV E/A ratio:  0.66         Systemic VTI:  0.26 m                             Systemic Diam: 2.20 cm Kathlyn Sacramento MD Electronically signed by Kathlyn Sacramento MD Signature Date/Time: 03/29/2022/1:14:48 PM    Final    CT ANGIO HEAD NECK W WO CM  Result Date: 03/28/2022 CLINICAL DATA:  Abnormal speech in simple math beginning at 11:20 a.m. today. Expressive aphasia EXAM: CT ANGIOGRAPHY HEAD AND NECK TECHNIQUE: Multidetector CT imaging of the head and neck was performed using the standard protocol during bolus administration of intravenous contrast. Multiplanar CT image reconstructions and MIPs were obtained to evaluate the vascular anatomy. Carotid stenosis measurements (when applicable) are obtained utilizing NASCET criteria, using the distal internal carotid diameter as the denominator. RADIATION DOSE REDUCTION: This exam was performed according to the departmental  dose-optimization program which includes automated exposure control, adjustment of the mA and/or kV according to patient size and/or use of iterative reconstruction technique. CONTRAST:  70m OMNIPAQUE IOHEXOL 350 MG/ML SOLN COMPARISON:  CT head without contrast and MR head without contrast 03/28/2022. FINDINGS: CTA NECK FINDINGS Aortic arch: The 3 vessel arch configuration is present. Mild atherosclerotic changes present in the distal arch and at the origin of the left subclavian artery without significant stenosis or aneurysm. Right carotid system: Right common carotid artery is within normal limits. Atherosclerotic calcifications are present bifurcation proximal right ICA without a significant stenosis of greater than 50% relative to the more distal vessel. Left carotid system: The left common carotid artery is within normal limits. Calcifications are present in the proximal left ICA without a significant stenosis. The cervical left ICA is otherwise normal. Vertebral arteries: The left vertebral artery is dominant vessel. Both vertebral arteries originate the subclavian arteries without significant stenosis. No significant stenosis is present in either vertebral artery in the neck. Skeleton: Ankylosis noted at C2-3 and C3-4 preservation of vertebral body heights. Degenerative changes result in foraminal stenosis, greatest C5-6 and to lesser extent at C6-7. Other neck: Soft tissues the neck are otherwise unremarkable. Salivary glands are within normal limits. Thyroid is normal. No significant adenopathy is present. No focal mucosal or submucosal lesions are present. Upper chest: Centrilobular emphysematous changes are present. No nodule or mass lesion is present. Review of the MIP images confirms the above findings CTA HEAD FINDINGS Anterior circulation: Atherosclerotic calcifications are present within the cavernous internal carotid arteries bilaterally. No significant stenosis is present through the ICA termini.  The left A1 segment is dominant. A1 and M1 segments are normal bilaterally. MCA bifurcations are intact. Mild irregularity is present within distal ACA and MCA branch vessels without a significant proximal stenosis or occlusion. Posterior circulation: Atherosclerotic changes are present at the dural margin of the vertebral arteries bilaterally without definite stenosis. Vertebrobasilar junction and basilar artery are normal. The left posterior cerebral artery originates from basilar tip. The right posterior cerebral artery is of fetal type. The PCA branch vessels demonstrate some distal irregularity without a significant proximal stenosis or occlusion. Venous sinuses: The dural sinuses are patent. The straight sinus and deep cerebral veins are intact. Cortical veins are within normal limits. No significant vascular malformation is evident. Anatomic variants: Fetal type right posterior cerebral artery. Review of the MIP images confirms the above findings IMPRESSION: 1. Atherosclerotic changes at the carotid bifurcations bilaterally and cavernous internal carotid arteries bilaterally without significant stenosis of greater than 50% relative to the more distal vessels. 2. Mild distal small  vessel disease without a significant proximal stenosis or occlusion within the Circle of Willis. 3. Fetal type right posterior cerebral artery. 4. Ankylosis at C2-3 and C3-4 preservation of vertebral body heights. 5. Degenerative changes in the cervical spine result in foraminal stenosis, greatest C5-6 and to lesser extent at C6-7. 6.  Emphysema (ICD10-J43.9). Electronically Signed   By: San Morelle M.D.   On: 03/28/2022 14:44   MR BRAIN WO CONTRAST  Result Date: 03/28/2022 CLINICAL DATA:  Neuro deficit, acute, stroke suspected. Sudden onset of dysphagia. EXAM: MRI HEAD WITHOUT CONTRAST TECHNIQUE: Multiplanar, multiecho pulse sequences of the brain and surrounding structures were obtained without intravenous contrast.  COMPARISON:  CT head without contrast 03/28/2022. MR head without contrast 07/03/2014 FINDINGS: Brain: No acute infarct, hemorrhage, or mass lesion is present. Mild periventricular white matter changes are similar the prior exam. Deep brain nuclei are within normal limits. The brainstem and cerebellum are within normal limits. Vascular: Flow is present in the major intracranial arteries. Skull and upper cervical spine: The craniocervical junction is normal. Upper cervical spine is within normal limits. Marrow signal is unremarkable. Sinuses/Orbits: The paranasal sinuses and mastoid air cells are clear. The globes and orbits are within normal limits. IMPRESSION: 1. No acute intracranial abnormality or significant interval change. 2. Stable mild periventricular white matter disease. This likely reflects the sequela of chronic microvascular ischemia. Electronically Signed   By: San Morelle M.D.   On: 03/28/2022 12:39   CT HEAD CODE STROKE WO CONTRAST  Result Date: 03/28/2022 CLINICAL DATA:  Code stroke. Slurred speech and difficulty writing with right hand. EXAM: CT HEAD WITHOUT CONTRAST TECHNIQUE: Contiguous axial images were obtained from the base of the skull through the vertex without intravenous contrast. RADIATION DOSE REDUCTION: This exam was performed according to the departmental dose-optimization program which includes automated exposure control, adjustment of the mA and/or kV according to patient size and/or use of iterative reconstruction technique. COMPARISON:  Brain MRI 07/03/2014 FINDINGS: Brain: No evidence of acute infarction, hemorrhage, hydrocephalus, extra-axial collection or mass lesion/mass effect. Mild generalized cerebral volume loss Vascular: No focal vessel hyperdensity based on reformats. Skull: Normal. Negative for fracture or focal lesion. Sinuses/Orbits: No acute finding. Other: These results were communicated to Dr. Rory Percy at 12:15 pm on 03/28/2022 by text page via the [facility]  messaging system. ASPECTS [facility] Stroke Program Early CT Score) - Ganglionic level infarction (caudate, lentiform nuclei, internal capsule, insula, M1-M3 cortex): 7 - Supraganglionic infarction (M4-M6 cortex): 3 Total score (0-10 with 10 being normal): 10 IMPRESSION: No acute finding.  ASPECTS is 10. Electronically Signed   By: Jorje Guild M.D.   On: 03/28/2022 12:15    Microbiology: Results for orders placed or performed in visit on 01/25/22  MICROSCOPIC MESSAGE     Status: None   Collection Time: 01/25/22  8:03 AM  Result Value Ref Range Status   Note   Final    Comment: This urine was analyzed for the presence of WBC,  RBC, bacteria, casts, and other formed elements.  Only those elements seen were reported. . .     Labs: CBC: Recent Labs  Lab 03/28/22 1154  WBC 7.5  NEUTROABS 4.6  HGB 15.6  HCT 48.2  MCV 92.9  PLT 578   Basic Metabolic Panel: Recent Labs  Lab 03/28/22 1154  NA 137  K 4.2  CL 103  CO2 27  GLUCOSE 265*  BUN 16  CREATININE 1.06  CALCIUM 8.9   Liver Function Tests: Recent Labs  Lab 03/28/22 1154  AST 31  ALT 26  ALKPHOS 92  BILITOT 0.9  PROT 7.5  ALBUMIN 4.0   CBG: Recent Labs  Lab 03/28/22 1150 03/28/22 1349 03/28/22 1702 03/28/22 2219 03/29/22 0744  GLUCAP 226* 198* 128* 139* 141*    Discharge time spent: {LESS THAN/GREATER THAN:26388} 30 minutes.  Signed: Ezekiel Slocumb, DO [facility] 03/29/2022

## 2022-03-30 LAB — HEMOGLOBIN A1C
Hgb A1c MFr Bld: 7.3 % — ABNORMAL HIGH (ref 4.8–5.6)
Mean Plasma Glucose: 163 mg/dL

## 2022-04-02 DIAGNOSIS — D3131 Benign neoplasm of right choroid: Secondary | ICD-10-CM | POA: Diagnosis not present

## 2022-04-06 ENCOUNTER — Ambulatory Visit (INDEPENDENT_AMBULATORY_CARE_PROVIDER_SITE_OTHER): Payer: Medicare Other | Admitting: Family Medicine

## 2022-04-06 ENCOUNTER — Encounter: Payer: Self-pay | Admitting: Family Medicine

## 2022-04-06 ENCOUNTER — Telehealth: Payer: Self-pay | Admitting: Internal Medicine

## 2022-04-06 VITALS — BP 126/76 | HR 64 | Temp 97.5°F | Ht 70.0 in | Wt 248.0 lb

## 2022-04-06 DIAGNOSIS — E1169 Type 2 diabetes mellitus with other specified complication: Secondary | ICD-10-CM | POA: Diagnosis not present

## 2022-04-06 DIAGNOSIS — I152 Hypertension secondary to endocrine disorders: Secondary | ICD-10-CM

## 2022-04-06 DIAGNOSIS — E119 Type 2 diabetes mellitus without complications: Secondary | ICD-10-CM | POA: Diagnosis not present

## 2022-04-06 DIAGNOSIS — G459 Transient cerebral ischemic attack, unspecified: Secondary | ICD-10-CM | POA: Diagnosis not present

## 2022-04-06 DIAGNOSIS — I25118 Atherosclerotic heart disease of native coronary artery with other forms of angina pectoris: Secondary | ICD-10-CM | POA: Diagnosis not present

## 2022-04-06 DIAGNOSIS — E039 Hypothyroidism, unspecified: Secondary | ICD-10-CM

## 2022-04-06 DIAGNOSIS — E785 Hyperlipidemia, unspecified: Secondary | ICD-10-CM | POA: Diagnosis not present

## 2022-04-06 DIAGNOSIS — J432 Centrilobular emphysema: Secondary | ICD-10-CM | POA: Diagnosis not present

## 2022-04-06 DIAGNOSIS — E1159 Type 2 diabetes mellitus with other circulatory complications: Secondary | ICD-10-CM | POA: Diagnosis not present

## 2022-04-06 MED ORDER — ONETOUCH DELICA LANCETS 33G MISC: 1 refills | Status: AC

## 2022-04-06 NOTE — Telephone Encounter (Signed)
He does not need a repeat lipid panel.  However, I would suggest that he have a BMP done about 2 weeks after we increased losartan.  Labs drawn in the ED were done just 3-4 days after our office visit and may not reflect changes to his potassium and kidney function from the medication change that we made.  Nelva Bush, MD [facility]

## 2022-04-06 NOTE — Progress Notes (Signed)
   SUBJECTIVE:   Chief Complaint  Patient presents with  . Establish Care    Transfer of Care- Dr. Olivia Mackie   HPI Patient presents to clinic to transfer care  No acute concerns today  Diabetes type 2 Asymptomatic.  Currently on glimepiride 1 mg daily.  Follows with Dr. Norma Fredrickson at [place] clinic.  Hypothyroid Asymptomatic.  Currently on levothyroxine 200 mg daily and liothyronine 2.5 mcg every other day.  Has not followed with endocrinology.  Hypertension Asymptomatic.  Takes Losartan 50 mg daily and Ziac 2.5-6.25 mg daily.    TIA. Reports recent admission at [facility] on 12/03 for TIA.  Presented with difficulty speaking.  He was hypertensive in the ED, BP 219/80 and 172/63 and was bradycardic with HR 58.  Labs reassuring.  CT head negative for acute hemorrhage.  PERTINENT PMH / PSH: Diabetes type 2 Hypertension Hypothyroidism HFpEF Hyperlipidemia TIA  OBJECTIVE:  BP 126/76   Pulse 64   Temp (!) 97.5 F (36.4 C)   Ht '5\' 10"'$  (1.778 m)   Wt 248 lb (112.5 kg)   SpO2 97%   BMI 35.58 kg/m    Physical Exam Constitutional:      General: He is not in acute distress.    Appearance: He is normal weight. He is not ill-appearing.  HENT:     Head: Normocephalic.     Mouth/Throat:     Mouth: Mucous membranes are dry.  Eyes:     Conjunctiva/sclera: Conjunctivae normal.  Cardiovascular:     Rate and Rhythm: Normal rate and regular rhythm.     Pulses: Normal pulses.  Pulmonary:     Effort: Pulmonary effort is normal.     Breath sounds: Normal breath sounds.  Abdominal:     General: Bowel sounds are normal.  Musculoskeletal:        General: Normal range of motion.     Cervical back: Normal range of motion.  Lymphadenopathy:     Cervical: No cervical adenopathy.  Neurological:     Mental Status: He is alert. Mental status is at baseline.  Psychiatric:        Mood and Affect: Mood normal.        Behavior: Behavior normal.        Thought Content: Thought content normal.         Judgment: Judgment normal.    ASSESSMENT/PLAN:  Type 2 diabetes mellitus without complication, without long-term current use of insulin (HCC)   PDMP reviewed***  No follow-ups on file.  Carollee Leitz, MD

## 2022-04-06 NOTE — Telephone Encounter (Signed)
The patient has been made aware and will come back in two weeks for the BMET.

## 2022-04-06 NOTE — Telephone Encounter (Signed)
Pt would like to know if lab work is still needed being that he had a recent ED visit on 12/4 and had blood work done while there. Please advise

## 2022-04-06 NOTE — Patient Instructions (Addendum)
It was a pleasure meeting you today. Thank you for allowing me to take part in your health care.  Our goals for today as we discussed include:  For your diabetes Check your prescription of glimepiride.  I need to know if you are taking a 1 mg tablet daily or 2 mg tablet daily.  You can MyChart me and let me know.  If you are currently taking glimepiride 1 mg daily I would recommend you start the Rybelsus 3 mg daily.  For your blood pressure Continue losartan 50 mg twice daily Continue Ziac 2.5-6.25 mg daily.  Extended refill in for your lancets.  Continue aspirin 81 mg daily Continue Plavix 75 mg daily  Follow up with Cardiology and Endocrinology as scheduled  If you have any signs of headaches, visual changes, slurred speech, weakness, numbness/tingling please call 911 or have someone take you to the emergency department.   If you have any questions or concerns, please do not hesitate to call the office at [phone].  I look forward to our next visit and until then take care and stay safe.  Regards,   Carollee Leitz, MD   [facility]

## 2022-04-07 DIAGNOSIS — D2272 Melanocytic nevi of left lower limb, including hip: Secondary | ICD-10-CM | POA: Diagnosis not present

## 2022-04-07 DIAGNOSIS — R208 Other disturbances of skin sensation: Secondary | ICD-10-CM | POA: Diagnosis not present

## 2022-04-07 DIAGNOSIS — Z85828 Personal history of other malignant neoplasm of skin: Secondary | ICD-10-CM | POA: Diagnosis not present

## 2022-04-07 DIAGNOSIS — D2271 Melanocytic nevi of right lower limb, including hip: Secondary | ICD-10-CM | POA: Diagnosis not present

## 2022-04-07 DIAGNOSIS — L820 Inflamed seborrheic keratosis: Secondary | ICD-10-CM | POA: Diagnosis not present

## 2022-04-07 DIAGNOSIS — D2261 Melanocytic nevi of right upper limb, including shoulder: Secondary | ICD-10-CM | POA: Diagnosis not present

## 2022-04-07 DIAGNOSIS — L538 Other specified erythematous conditions: Secondary | ICD-10-CM | POA: Diagnosis not present

## 2022-04-07 DIAGNOSIS — L570 Actinic keratosis: Secondary | ICD-10-CM | POA: Diagnosis not present

## 2022-04-07 DIAGNOSIS — X32XXXA Exposure to sunlight, initial encounter: Secondary | ICD-10-CM | POA: Diagnosis not present

## 2022-04-09 ENCOUNTER — Encounter: Payer: Self-pay | Admitting: Family Medicine

## 2022-04-25 ENCOUNTER — Encounter: Payer: Self-pay | Admitting: Family Medicine

## 2022-04-25 NOTE — Assessment & Plan Note (Addendum)
Acute.  Symptoms resolved. Continue DAPT x 3 weeks then followed by Plavix 75 mg monotherapy Hypertension control Continue statin  Strict return precautions provided

## 2022-04-25 NOTE — Assessment & Plan Note (Signed)
Chronic.  Stable.  At goal LDL less than 70. Continue Lipitor 20 mg daily

## 2022-04-25 NOTE — Assessment & Plan Note (Signed)
Chronic.  Stable.  Recent A1c 7.3 Continue glimepiride 1 mg Follows with Dr. Norma Fredrickson at [place] [facility]

## 2022-04-25 NOTE — Assessment & Plan Note (Signed)
Chronic.  Stable.  Not followed by endocrinology.  Currently takes Synthroid 200 mcg daily and liothyronine 2.5 mg daily.  Recent TSH 1.45 Repeat TSH annually

## 2022-04-26 ENCOUNTER — Encounter: Payer: Self-pay | Admitting: Family Medicine

## 2022-04-26 NOTE — Assessment & Plan Note (Signed)
Chronic.  Stable. Follows with pulmonology 

## 2022-04-26 NOTE — Assessment & Plan Note (Signed)
Chronic.  Stable. Continue Imdur 30 mg a.m. and 60 mg p.m. Continue statin Follows with cardiology.

## 2022-04-26 NOTE — Assessment & Plan Note (Signed)
Chronic.  Stable.  Well-controlled. Continue losartan 50 mg daily Continue Ziac 2.5-6.25 mg daily

## 2022-04-28 ENCOUNTER — Ambulatory Visit (INDEPENDENT_AMBULATORY_CARE_PROVIDER_SITE_OTHER): Payer: Medicare Other

## 2022-04-28 VITALS — Ht 70.0 in | Wt 235.0 lb

## 2022-04-28 DIAGNOSIS — Z0000 Encounter for general adult medical examination without abnormal findings: Secondary | ICD-10-CM

## 2022-04-28 NOTE — Progress Notes (Signed)
Subjective:   Tony Lawrence is a 77 y.o. male who presents for Medicare Annual/Subsequent preventive examination. I connected with  Tony Lawrence on 04/28/22 by a audio enabled telemedicine application and verified that I am speaking with the correct person using two identifiers.  Patient Location: Home  Provider Location: Home Office  I discussed the limitations of evaluation and management by telemedicine. The patient expressed understanding and agreed to proceed.  Review of Systems     Cardiac Risk Factors include: advanced age (>62mn, >>18women);male gender;hypertension;dyslipidemia;diabetes mellitus     Objective:    Today's Vitals   04/28/22 1404  Weight: 235 lb (106.6 kg)  Height: _0  (1.778 m)   Body mass index is 33.72 kg/m.     04/28/2022    2:08 PM 03/29/2022   11:36 AM 02/02/2022   10:54 AM 02/20/2021    1:29 PM 02/20/2020    1:36 PM 07/04/2019    4:31 PM 01/29/2019   12:23 PM  Advanced Directives  Does Patient Have a Medical Advance Directive? _1  No Yes  Type of AParamedicof [place]Living will [place]Living will HWest [place]Living will [place]Living will HNorth LauderdaleLiving will  [place]Living will  Does patient want to make changes to medical advance directive?  No - Patient declined   No - Patient declined  No - Patient declined  Copy of HA[place] Chart? No - copy requested   Yes - validated most recent copy scanned in chart (See row information) Yes - validated most recent copy scanned in chart (See row information)  Yes - validated most recent copy scanned in chart (See row information)    Current Medications (verified) Outpatient Encounter Medications as of 04/28/2022  Medication Sig   Acetaminophen (TYLENOL PO) Take 1,300 mg by mouth every other day.   atorvastatin (LIPITOR) 20 MG tablet  Take 1 tablet (20 mg total) by mouth daily at 6 PM. At night   bisoprolol-hydrochlorothiazide (ZIAC) 2.5-6.25 MG tablet Take 1 tablet by mouth daily.   clopidogrel (PLAVIX) 75 MG tablet TAKE 1 TABLET BY MOUTH DAILY   co-enzyme Q-10 30 MG capsule Take 30 mg by mouth 3 (three) times daily.   glimepiride (AMARYL) 1 MG tablet Take 1 tablet (1 mg total) by mouth daily with breakfast.   glucose blood test strip E 11.9 qd Once Daily one touch   isosorbide mononitrate (IMDUR) 30 MG 24 hr tablet TAKE ONE TABLET BY MOUTH EVERY MORNING AND TAKE TWO TABLETS BY MOUTH EVERY EVENING   Lancets (ONETOUCH ULTRASOFT) lancets Qd E 11.9 Once daily   levothyroxine (SYNTHROID) 200 MCG tablet Take 1 tablet (200 mcg total) by mouth daily. In am on empty stomach   liothyronine (CYTOMEL) 5 MCG tablet Take 0.5 tablets (2.5 mcg total) by mouth every other day.   losartan (COZAAR) 50 MG tablet Take 1 tablet (50 mg total) by mouth in the morning and at bedtime. Stop ramipril 20 mg qd, d/c losartan 25 mg qd   Multiple Vitamins-Minerals (CENTRUM SILVER 50+MEN PO) Take 1 tablet by mouth every morning.    OneTouch Delica Lancets 381EMISC Check blood sugars twice weekly   pantoprazole (PROTONIX) 40 MG tablet Take 1 tablet (40 mg total) by mouth daily.   No facility-administered encounter medications on file as of 04/28/2022.    Allergies (verified) Penicillins   History: Past Medical History:  Diagnosis Date   Actinic keratoses    Actinic keratoses    Allergy    Aneurysm of ascending aorta without rupture ([place]) 03/27/2022   Annual physical exam 02/27/2021   Aortic valve regurgitation 03/27/2022   Arthritis    low back pain s/p shots and blocks, knee pain   Arthritis of right knee 11/15/2019   BCC (basal cell carcinoma of skin)    forehead and chest Dr. Evorn Lawrence q 6 months    Benign prostatic hyperplasia with urinary obstruction 03/06/2012   CAD (coronary artery disease)    CAD (coronary artery disease) 03/28/2022    Cancer of ventral surface of tongue ([place]) 11/04/2015   Chronic diarrhea 62/06/5595   Complication of anesthesia    SEVERE SORE THROAT AFTER BIOPSY 2010   Contusion of toenail 02/27/2021   Coronary artery disease    Chronic total occlusion of mid LAD   COVID-19    11/17/20   Diabetes mellitus ([place]) 03/12/2013   Diabetes mellitus without complication ([place])    Diarrhea 03/21/2018   Erectile dysfunction due to arterial insufficiency 04/24/2019   Essential hypertension 12/22/2017   Flank pain 11/30/2013   GERD (gastroesophageal reflux disease)    H/O total knee replacement, 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   History of chicken pox    History of shingles    History of skin cancer 03/21/2018   History of tongue cancer 03/21/2018   HLD (hyperlipidemia) 03/28/2022   Hyperlipidemia    Hypertension    Hypothyroidism    Increased frequency of urination 03/12/2013   Malignant neoplasm of tongue, tip and lateral border ([place]) 11/04/2015   Mitral valve insufficiency 12/22/2019   Neuritis or radiculitis due to rupture of lumbar intervertebral disc 01/16/2014   Onychomycosis 02/27/2021   OSA on CPAP    Other intervertebral disc displacement, lumbar region 01/16/2014   Parapelvic renal cyst 12/18/2013   Primary osteoarthritis of both knees 05/13/2014   Primary osteoarthritis of left knee 11/22/2018   SCC (squamous cell carcinoma)    invasive left Post. lateral Tongue [facility] Dr. Ihor Lawrence excised 08/25/20 surgical exc unc ?recurrent vs new primary    Slowing of urinary stream 03/12/2013   Squamous cell carcinoma in situ    nose 2021    Squamous cell carcinoma in situ    nose 2021    Stroke (HCC)    Thrombocytopenia ([place]) 01/27/2022   Tongue cancer ([place])    Squamous cell CA of tongue 11/11/15 no chemo or radiation ENT Dr. Tami Lawrence, [facility] H/o    Tongue cancer [facility])    Squamous cell CA of tongue 11/11/15 no chemo or radiation ENT Dr. Tami Lawrence, [facility] H/o    Urinary hesitancy 03/12/2013   Valvular heart disease 03/23/2021    Past Surgical History:  Procedure Laterality Date   BIOPSY TONGUE     11/11/15 SCC tongue    cancer of tongue  08/2020   removal of cancer   CARDIAC CATHETERIZATION N/A 11/10/2015   Procedure: Left Heart Cath and Coronary Angiography;  Surgeon: Wellington Hampshire, MD;  Location: [place] [facility] LAB;  Service: Cardiovascular;  Laterality: N/A;   COLONOSCOPY  05/2006   COLONOSCOPY WITH PROPOFOL N/A 05/24/2017   Procedure: COLONOSCOPY WITH PROPOFOL;  Surgeon: Lollie Sails, MD;  Location: [facility] ENDOSCOPY;  Service: Endoscopy;  Laterality: N/A;   EXCISION OF TONGUE LESION N/A 11/11/2015   Procedure: EXCISION OF TONGUE LESION/ WITH FROZEN SECTION;  Surgeon: Beverly Gust, MD;  Location: [facility] ORS;  Service: ENT;  Laterality: N/A;  REPLACEMENT TOTAL KNEE Left    REPLACEMENT TOTAL KNEE Right 04/2020   [place]   right tka minimally invasive 05/06/20 Dr. Armandina Gemma     Family History  Problem Relation Age of Onset   Myelodysplastic syndrome Mother    Arthritis Mother    Emphysema Father    Alcohol abuse Father    COPD Father    Depression Father    Early death Maternal Grandfather    Heart disease Maternal Grandfather    Stroke Maternal Grandfather    Social History   Socioeconomic History   Marital status: Married    Spouse name: Not on file   Number of children: Not on file   Years of education: Not on file   Highest education level: Not on file  Occupational History   Not on file  Tobacco Use   Smoking status: Never   Smokeless tobacco: Never  Vaping Use   Vaping Use: Never used  Substance and Sexual Activity   Alcohol use: Never    Alcohol/week: 0.0 standard drinks of alcohol   Drug use: No   Sexual activity: Yes  Other Topics Concern   Not on file  Social History Narrative   Married    3 sons    Forensic psychologist, 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    Owns guns, wears seat belt, safe in relationship   Social Determinants of  Health   Financial Resource Strain: Low Risk  (04/28/2022)   Overall Financial Resource Strain (CARDIA)    Difficulty of Paying Living Expenses: Not hard at all  Food Insecurity: No [place] (04/28/2022)   Hunger Vital Sign    Worried About Running Out of Food in the Last Year: Never true    [place] in the Last Year: Never true  Transportation Needs: No Transportation Needs (04/28/2022)   PRAPARE - Hydrologist (Medical): No    Lack of Transportation (Non-Medical): No  Physical Activity: Insufficiently Active (04/28/2022)   Exercise Vital Sign    Days of Exercise per Week: 3 days    Minutes of Exercise per Session: 30 min  Stress: No Stress Concern Present (04/28/2022)   [place]    Feeling of Stress : Not at all  Social Connections: Moderately Integrated (04/28/2022)   Social Connection and Isolation Panel [NHANES]    Frequency of Communication with Friends and Family: More than three times a week    Frequency of Social Gatherings with Friends and Family: More than three times a week    Attends Religious Services: More than 4 times per year    Active Member of [employer] or Organizations: No    Attends Music therapist: Never    Marital Status: Married    Tobacco Counseling Counseling given: Not Answered   Clinical Intake:  Pre-visit preparation completed: Yes  Pain : No/denies pain     Nutritional Risks: None Diabetes: Yes CBG done?: No Did pt. bring in CBG monitor from home?: No  How often do you need to have someone help you when you read instructions, pamphlets, or other written materials from your doctor or pharmacy?: 1 - Never  Diabetic?yes Nutrition Risk Assessment:  Has the patient had any N/V/D within the last 2 months?  No  Does the patient have any non-healing wounds?  No  Has the patient had any unintentional weight loss or weight gain?  No  Diabetes:  Is the patient diabetic?  Yes  If diabetic, was a CBG obtained today?  No  Did the patient bring in their glucometer from home?  No  How often do you monitor your CBG's? 3 x week .   Financial Strains and Diabetes Management:  Are you having any financial strains with the device, your supplies or your medication? No .  Does the patient want to be seen by Chronic Care Management for management of their diabetes?  No  Would the patient like to be referred to a Nutritionist or for Diabetic Management?  No   Diabetic Exams:  Diabetic Eye Exam: Completed 02/2022 Diabetic Foot Exam: Overdue, Pt has been advised about the importance in completing this exam. Pt is scheduled for diabetic foot exam on next office visit .   Interpreter Needed?: No  Information entered by :: Jadene Pierini, LPN   Activities of Daily Living    04/28/2022    2:08 PM 03/29/2022   11:25 AM  In your present state of health, do you have any difficulty performing the following activities:  Hearing? 0 0  Vision? 0 0  Difficulty concentrating or making decisions? 0 0  Walking or climbing stairs? 0 0  Dressing or bathing? 0 0  Doing errands, shopping? 0 0  Preparing Food and eating ? N   Using the Toilet? N   In the past six months, have you accidently leaked urine? N   Do you have problems with loss of bowel control? N   Managing your Medications? N   Managing your Finances? N   Housekeeping or managing your Housekeeping? N     Patient Care Team: Carollee Leitz, MD as PCP - General (Family Medicine) End, Harrell Gave, MD as PCP - Cardiology (Cardiology)  Indicate any recent Medical Services you may have received from other than [facility] providers in the past year (date may be approximate).     Assessment:   This is a routine wellness examination for Taylin.  Hearing/Vision screen Vision Screening - Comments:: Wears rx glasses - up to date with routine eye exams with  Dr.Profillio  Dietary issues  and exercise activities discussed: Current Exercise Habits: Home exercise routine, Type of exercise: walking, Time (Minutes): 30, Frequency (Times/Week): 3, Weekly Exercise (Minutes/Week): 90, Intensity: Mild, Exercise limited by: None identified   Goals Addressed   None    Depression Screen    04/28/2022    2:07 PM 04/06/2022    2:47 PM 12/29/2021    2:21 PM 09/25/2021    2:10 PM 02/20/2021    1:32 PM 02/20/2020    1:37 PM 11/14/2019    3:35 PM  PHQ 2/9 Scores  PHQ - 2 Score 0 0 0 0 0 0 0    Fall Risk    04/28/2022    2:04 PM 04/06/2022    2:46 PM 12/29/2021    2:21 PM 09/25/2021    2:10 PM 02/25/2021    2:37 PM  Fall Risk   Falls in the past year? 0 0 0 0 0  Number falls in past yr: 0 0 0 0 0  Injury with Fall? 0 0 0 0 0  Risk for fall due to : _0   Follow up Falls prevention discussed Falls evaluation completed Falls evaluation completed Falls evaluation completed Falls evaluation completed    [place]:  Any stairs in or  around the home? Yes  If so, are there any without handrails? No  Home free of loose throw rugs in walkways, pet beds, electrical cords, etc? Yes  Adequate lighting in your home to reduce risk of falls? Yes   ASSISTIVE DEVICES UTILIZED TO PREVENT FALLS:  Life alert? No  Use of a cane, walker or w/c? No  Grab bars in the bathroom? No  Shower chair or bench in shower? No  Elevated toilet seat or a handicapped toilet? No       01/11/2018   10:20 AM  MMSE - Mini Mental State Exam  Orientation to time 5  Orientation to Place 5  Registration 3  Attention/ Calculation 5  Recall 3  Language- name 2 objects 2  Language- repeat 1  Language- follow 3 step command 3  Language- read & follow direction 1  Write a sentence 1  Copy design 1  Total score 30        04/28/2022    2:08 PM 02/20/2021    1:33 PM 01/29/2019   12:24 PM  6CIT Screen  What Year? 0  points 0 points 0 points  What month? 0 points 0 points 0 points  What time? 0 points 0 points 0 points  Count back from 20 0 points 0 points 0 points  Months in reverse 0 points 0 points 0 points  Repeat phrase 0 points 0 points 0 points  Total Score 0 points 0 points 0 points    Immunizations Immunization History  Administered Date(s) Administered   Influenza Inj Mdck Quad Pf 01/11/2018   Influenza, High Dose Seasonal PF 03/25/2020, 01/19/2021   Influenza,inj,quad, With Preservative 01/11/2018   Influenza-Unspecified 02/10/2016, 01/25/2019, 02/23/2019   PFIZER(Purple Top)SARS-COV-2 Vaccination 06/09/2019, 07/03/2019, 03/25/2020   Pneumococcal Polysaccharide-23 03/01/2019   Tdap 08/02/2018   Zoster Recombinat (Shingrix) 12/01/2018, 02/07/2019    TDAP status: Up to date  Flu Vaccine status: Up to date  Pneumococcal vaccine status: Up to date  Covid-19 vaccine status: Completed vaccines  Qualifies for Shingles Vaccine? Yes   Zostavax completed Yes   Shingrix Completed?: Yes  Screening Tests Health Maintenance  Topic Date Due   FOOT EXAM  02/28/2020   COVID-19 Vaccine (4 - 2023-24 season) 12/25/2021   OPHTHALMOLOGY EXAM  02/17/2022   INFLUENZA VACCINE  07/25/2022 (Originally 11/24/2021)   HEMOGLOBIN A1C  09/28/2022   Diabetic kidney evaluation - Urine ACR  01/26/2023   Diabetic kidney evaluation - eGFR measurement  03/29/2023   Medicare Annual Wellness (AWV)  04/29/2023   DTaP/Tdap/Td (2 - Td or Tdap) 08/01/2028   Pneumonia Vaccine 64+ Years old  Completed   Hepatitis C Screening  Completed   Zoster Vaccines- Shingrix  Completed   HPV VACCINES  Aged Out   COLONOSCOPY (Pts 45-41yr Insurance coverage will need to be confirmed)  DK[place] GrantMaintenance Due  Topic Date Due   FOOT EXAM  02/28/2020   COVID-19 Vaccine (4 - 2023-24 season) 12/25/2021   OPHTHALMOLOGY EXAM  02/17/2022    Colorectal cancer screening: No longer required.    Lung Cancer Screening: (Low Dose CT Chest recommended if Age 77-80years, 30 pack-year currently smoking OR have quit w/in 15years.) does not qualify.   Lung Cancer Screening Referral: n/a  Additional Screening:  Hepatitis C Screening: does not qualify; Completed 08/17/2018  Vision Screening: Recommended annual ophthalmology exams for early detection of glaucoma and other disorders of the eye. Is the patient up to date with  their annual eye exam?  Yes  Who is the provider or what is the name of the office in which the patient attends annual eye exams? Dr.Profillio If pt is not established with a provider, would they like to be referred to a provider to establish care? No .   Dental Screening: Recommended annual dental exams for proper oral hygiene  Community Resource Referral / Chronic Care Management: CRR required this visit?  No   CCM required this visit?  No      Plan:     I have personally reviewed and noted the following in the patient's chart:   Medical and social history Use of alcohol, tobacco or illicit drugs  Current medications and supplements including opioid prescriptions. Patient is not currently taking opioid prescriptions. Functional ability and status Nutritional status Physical activity Advanced directives List of other physicians Hospitalizations, surgeries, and ER visits in previous 12 months Vitals Screenings to include cognitive, depression, and falls Referrals and appointments  In addition, I have reviewed and discussed with patient certain preventive protocols, quality metrics, and best practice recommendations. A written personalized care plan for preventive services as well as general preventive health recommendations were provided to patient.     Daphane Shepherd, LPN   11/02/4799   Nurse Notes: none

## 2022-04-28 NOTE — Patient Instructions (Signed)
Tony Lawrence , Thank you for taking time to come for your Medicare Wellness Visit. I appreciate your ongoing commitment to your health goals. Please review the following plan we discussed and let me know if I can assist you in the future.   These are the goals we discussed:  Goals      Follow up with Primary Care Provider     As needed        This is a list of the screening recommended for you and due dates:  Health Maintenance  Topic Date Due   Complete foot exam   02/28/2020   COVID-19 Vaccine (4 - 2023-24 season) 12/25/2021   Eye exam for diabetics  02/17/2022   Flu Shot  07/25/2022*   Hemoglobin A1C  09/28/2022   Yearly kidney health urinalysis for diabetes  01/26/2023   Yearly kidney function blood test for diabetes  03/29/2023   Medicare Annual Wellness Visit  04/29/2023   DTaP/Tdap/Td vaccine (2 - Td or Tdap) 08/01/2028   Pneumonia Vaccine  Completed   Hepatitis C Screening: USPSTF Recommendation to screen - Ages 77-79 yo.  Completed   Zoster (Shingles) Vaccine  Completed   HPV Vaccine  Aged Out   Colon Cancer Screening  Discontinued  *Topic was postponed. The date shown is not the original due date.    Advanced directives: Please bring a copy of your health care power of attorney and living will to the office to be added to your chart at your convenience.   Conditions/risks identified: Aim for 30 minutes of exercise or brisk walking, 6-8 glasses of water, and 5 servings of fruits and vegetables each day.  Next appointment: Follow up in one year for your annual wellness visit.   Preventive Care 77 Years and Older, Male  Preventive care refers to lifestyle choices and visits with your health care provider that can promote health and wellness. What does preventive care include? A yearly physical exam. This is also called an annual well check. Dental exams once or twice a year. Routine eye exams. Ask your health care provider how often you should have your eyes  checked. Personal lifestyle choices, including: Daily care of your teeth and gums. Regular physical activity. Eating a healthy diet. Avoiding tobacco and drug use. Limiting alcohol use. Practicing safe sex. Taking low doses of aspirin every day. Taking vitamin and mineral supplements as recommended by your health care provider. What happens during an annual well check? The services and screenings done by your health care provider during your annual well check will depend on your age, overall health, lifestyle risk factors, and family history of disease. Counseling  Your health care provider may ask you questions about your: Alcohol use. Tobacco use. Drug use. Emotional well-being. Home and relationship well-being. Sexual activity. Eating habits. History of falls. Memory and ability to understand (cognition). Work and work Statistician. Screening  You may have the following tests or measurements: Height, weight, and BMI. Blood pressure. Lipid and cholesterol levels. These may be checked every 5 years, or more frequently if you are over 43 years old. Skin check. Lung cancer screening. You may have this screening every year starting at age 77 if you have a 30-pack-year history of smoking and currently smoke or have quit within the past 15 years. Fecal occult blood test (FOBT) of the stool. You may have this test every year starting at age 77. Flexible sigmoidoscopy or colonoscopy. You may have a sigmoidoscopy every 5 years or a colonoscopy  every 10 years starting at age 77. Prostate cancer screening. Recommendations will vary depending on your family history and other risks. Hepatitis C blood test. Hepatitis B blood test. Sexually transmitted disease (STD) testing. Diabetes screening. This is done by checking your blood sugar (glucose) after you have not eaten for a while (fasting). You may have this done every 1-3 years. Abdominal aortic aneurysm (AAA) screening. You may need this  if you are a current or former smoker. Osteoporosis. You may be screened starting at age 77 if you are at high risk. Talk with your health care provider about your test results, treatment options, and if necessary, the need for more tests. Vaccines  Your health care provider may recommend certain vaccines, such as: Influenza vaccine. This is recommended every year. Tetanus, diphtheria, and acellular pertussis (Tdap, Td) vaccine. You may need a Td booster every 10 years. Zoster vaccine. You may need this after age 77. Pneumococcal 13-valent conjugate (PCV13) vaccine. One dose is recommended after age 77. Pneumococcal polysaccharide (PPSV23) vaccine. One dose is recommended after age 77. Talk to your health care provider about which screenings and vaccines you need and how often you need them. This information is not intended to replace advice given to you by your health care provider. Make sure you discuss any questions you have with your health care provider. Document Released: 05/09/2015 Document Revised: 12/31/2015 Document Reviewed: 02/11/2015 Elsevier Interactive Patient Education  2017 [place] Prevention in the Home Falls can cause injuries. They can happen to people of all ages. There are many things you can do to make your home safe and to help prevent falls. What can I do on the outside of my home? Regularly fix the edges of walkways and driveways and fix any cracks. Remove anything that might make you trip as you walk through a door, such as a raised step or threshold. Trim any bushes or trees on the path to your home. Use bright outdoor lighting. Clear any walking paths of anything that might make someone trip, such as rocks or tools. Regularly check to see if handrails are loose or broken. Make sure that both sides of any steps have handrails. Any raised decks and porches should have guardrails on the edges. Have any leaves, snow, or ice cleared regularly. Use sand  or salt on walking paths during winter. Clean up any spills in your garage right away. This includes oil or grease spills. What can I do in the bathroom? Use night lights. Install grab bars by the toilet and in the tub and shower. Do not use towel bars as grab bars. Use non-skid mats or decals in the tub or shower. If you need to sit down in the shower, use a plastic, non-slip stool. Keep the floor dry. Clean up any water that spills on the floor as soon as it happens. Remove soap buildup in the tub or shower regularly. Attach bath mats securely with double-sided non-slip rug tape. Do not have throw rugs and other things on the floor that can make you trip. What can I do in the bedroom? Use night lights. Make sure that you have a light by your bed that is easy to reach. Do not use any sheets or blankets that are too big for your bed. They should not hang down onto the floor. Have a firm chair that has side arms. You can use this for support while you get dressed. Do not have throw rugs and other things on  the floor that can make you trip. What can I do in the kitchen? Clean up any spills right away. Avoid walking on wet floors. Keep items that you use a lot in easy-to-reach places. If you need to reach something above you, use a strong step stool that has a grab bar. Keep electrical cords out of the way. Do not use floor polish or wax that makes floors slippery. If you must use wax, use non-skid floor wax. Do not have throw rugs and other things on the floor that can make you trip. What can I do with my stairs? Do not leave any items on the stairs. Make sure that there are handrails on both sides of the stairs and use them. Fix handrails that are broken or loose. Make sure that handrails are as long as the stairways. Check any carpeting to make sure that it is firmly attached to the stairs. Fix any carpet that is loose or worn. Avoid having throw rugs at the top or bottom of the stairs.  If you do have throw rugs, attach them to the floor with carpet tape. Make sure that you have a light switch at the top of the stairs and the bottom of the stairs. If you do not have them, ask someone to add them for you. What else can I do to help prevent falls? Wear shoes that: Do not have high heels. Have rubber bottoms. Are comfortable and fit you well. Are closed at the toe. Do not wear sandals. If you use a stepladder: Make sure that it is fully opened. Do not climb a closed stepladder. Make sure that both sides of the stepladder are locked into place. Ask someone to hold it for you, if possible. Clearly mark and make sure that you can see: Any grab bars or handrails. First and last steps. Where the edge of each step is. Use tools that help you move around (mobility aids) if they are needed. These include: Canes. Walkers. Scooters. Crutches. Turn on the lights when you go into a dark area. Replace any light bulbs as soon as they burn out. Set up your furniture so you have a clear path. Avoid moving your furniture around. If any of your floors are uneven, fix them. If there are any pets around you, be aware of where they are. Review your medicines with your doctor. Some medicines can make you feel dizzy. This can increase your chance of falling. Ask your doctor what other things that you can do to help prevent falls. This information is not intended to replace advice given to you by your health care provider. Make sure you discuss any questions you have with your health care provider. Document Released: 02/06/2009 Document Revised: 09/18/2015 Document Reviewed: 05/17/2014 Elsevier Interactive Patient Education  2017 [employer].

## 2022-05-03 ENCOUNTER — Other Ambulatory Visit
Admission: RE | Admit: 2022-05-03 | Discharge: 2022-05-03 | Disposition: A | Discharge status: Home / Self Care | Payer: Medicare Other | Attending: Internal Medicine | Admitting: Internal Medicine

## 2022-05-03 DIAGNOSIS — Z79899 Other long term (current) drug therapy: Secondary | ICD-10-CM

## 2022-05-03 DIAGNOSIS — H2511 Age-related nuclear cataract, right eye: Secondary | ICD-10-CM | POA: Diagnosis not present

## 2022-05-03 DIAGNOSIS — H2512 Age-related nuclear cataract, left eye: Secondary | ICD-10-CM | POA: Diagnosis not present

## 2022-05-03 DIAGNOSIS — E119 Type 2 diabetes mellitus without complications: Secondary | ICD-10-CM | POA: Diagnosis not present

## 2022-05-03 LAB — BASIC METABOLIC PANEL
Anion gap: 7 (ref 5–15)
BUN: 14 mg/dL (ref 8–23)
CO2: 29 mmol/L (ref 22–32)
Calcium: 8.6 mg/dL — ABNORMAL LOW (ref 8.9–10.3)
Chloride: 100 mmol/L (ref 98–111)
Creatinine, Ser: 1 mg/dL (ref 0.61–1.24)
GFR, Estimated: >60 mL/min (ref >60)
Glucose, Bld: 168 mg/dL — ABNORMAL HIGH (ref 70–99)
Potassium: 3.8 mmol/L (ref 3.5–5.1)
Sodium: 136 mmol/L (ref 135–145)

## 2022-05-03 LAB — LIPID PANEL
Cholesterol: 101 mg/dL (ref 0–200)
HDL: 25 mg/dL — ABNORMAL LOW (ref >40)
LDL Cholesterol: 26 mg/dL (ref 0–99)
Total CHOL/HDL Ratio: 4 RATIO
Triglycerides: 251 mg/dL — ABNORMAL HIGH (ref <150)
VLDL: 50 mg/dL — ABNORMAL HIGH (ref 0–40)

## 2022-05-03 LAB — HM DIABETES EYE EXAM

## 2022-05-05 ENCOUNTER — Telehealth: Payer: Self-pay | Admitting: Internal Medicine

## 2022-05-05 ENCOUNTER — Other Ambulatory Visit: Payer: Self-pay

## 2022-05-05 DIAGNOSIS — Z79899 Other long term (current) drug therapy: Secondary | ICD-10-CM

## 2022-05-05 MED ORDER — ICOSAPENT ETHYL 1 G PO CAPS: 2.0000 g | ORAL_CAPSULE | Freq: Two times a day (BID) | ORAL | 3 refills | Status: DC

## 2022-05-05 NOTE — Telephone Encounter (Signed)
Mr. Kinsella can try taking OTC omega-3 (fish oil) 2 gm BID with repeat lipid panel in ~3 months.  While the evidence behind this regimen is not as good as Vascepa, it may allow [place] to get his triglycerides to goal.  Reducing his carbohydrate intake can also help.  Nelva Bush, MD [facility]

## 2022-05-05 NOTE — Telephone Encounter (Signed)
Pt c/o medication issue:  1. Name of Medication:   icosapent Ethyl (VASCEPA) 1 g capsule    2. How are you currently taking this medication (dosage and times per day)?   Take 2 capsules (2 g total) by mouth 2 (two) times daily.    3. Are you having a reaction (difficulty breathing--STAT)? no  4. What is your medication issue? Medication is too expensive calling to see other options. Please advise

## 2022-05-06 DIAGNOSIS — M5414 Radiculopathy, thoracic region: Secondary | ICD-10-CM | POA: Diagnosis not present

## 2022-05-06 DIAGNOSIS — R0781 Pleurodynia: Secondary | ICD-10-CM | POA: Diagnosis not present

## 2022-05-06 DIAGNOSIS — M40204 Unspecified kyphosis, thoracic region: Secondary | ICD-10-CM | POA: Diagnosis not present

## 2022-05-06 DIAGNOSIS — M5134 Other intervertebral disc degeneration, thoracic region: Secondary | ICD-10-CM | POA: Diagnosis not present

## 2022-05-06 NOTE — Telephone Encounter (Signed)
Left message to call back  

## 2022-05-07 ENCOUNTER — Telehealth: Payer: Self-pay | Admitting: Family Medicine

## 2022-05-07 MED ORDER — OMEGA-3 1000 MG PO CAPS: 2000.0000 mg | ORAL_CAPSULE | Freq: Two times a day (BID) | ORAL | 0 refills | Status: DC

## 2022-05-07 NOTE — Telephone Encounter (Signed)
Patient was returning call. Please advise ?

## 2022-05-07 NOTE — Telephone Encounter (Signed)
Pt called in staying that he would like to speak to Dr. Volanda Napoleon or her assistant concerning med Rybelsus '3mg'$ . He said he's sugar been from 108-130, and he said he would like to know if he has to increase Rybelsus to '7mg'$ ? He would like to call back.

## 2022-05-07 NOTE — Telephone Encounter (Signed)
Pt made aware of MD's recommendations and verbalized understanding. Medication list updated and repeat lab order placed.

## 2022-05-10 NOTE — Telephone Encounter (Signed)
Spoke to Patient he would like to know if he should still be taking the Rybelsus? He ran out of Rybelsus 3 mg on 05/08/22 or 05/09/22. Patient states if so he needs a prescription for it. Patient states he would like to stay on the 3 mg if possible instead of moving up to the 7 mg. Patient will be leaving town for 2 weeks at the end of this week. Please advise. Patient verbalized understanding to your note.

## 2022-05-10 NOTE — Telephone Encounter (Signed)
Per fax received from [employer] of [place], Icosapent Ethyl 1 gm caps have been approved at Tier 1.. Authorization for the drug will end on 05/06/2023 provided member remains enrolled in the current plan.

## 2022-05-11 DIAGNOSIS — R809 Proteinuria, unspecified: Secondary | ICD-10-CM | POA: Diagnosis not present

## 2022-05-11 DIAGNOSIS — E1129 Type 2 diabetes mellitus with other diabetic kidney complication: Secondary | ICD-10-CM | POA: Diagnosis not present

## 2022-05-11 DIAGNOSIS — E785 Hyperlipidemia, unspecified: Secondary | ICD-10-CM | POA: Diagnosis not present

## 2022-05-11 DIAGNOSIS — E1159 Type 2 diabetes mellitus with other circulatory complications: Secondary | ICD-10-CM | POA: Diagnosis not present

## 2022-05-11 DIAGNOSIS — E1169 Type 2 diabetes mellitus with other specified complication: Secondary | ICD-10-CM | POA: Diagnosis not present

## 2022-05-11 DIAGNOSIS — E1165 Type 2 diabetes mellitus with hyperglycemia: Secondary | ICD-10-CM | POA: Diagnosis not present

## 2022-05-11 DIAGNOSIS — I152 Hypertension secondary to endocrine disorders: Secondary | ICD-10-CM | POA: Diagnosis not present

## 2022-05-12 ENCOUNTER — Other Ambulatory Visit: Payer: Self-pay | Admitting: Family Medicine

## 2022-05-12 ENCOUNTER — Telehealth: Payer: Self-pay

## 2022-05-12 MED ORDER — ICOSAPENT ETHYL 1 G PO CAPS: 2.0000 g | ORAL_CAPSULE | Freq: Two times a day (BID) | ORAL | 1 refills | Status: DC

## 2022-05-12 MED ORDER — RYBELSUS 7 MG PO TABS: 7.0000 mg | ORAL_TABLET | Freq: Every day | ORAL | 3 refills | Status: DC

## 2022-05-12 NOTE — Telephone Encounter (Signed)
Patient returned office phone call. 

## 2022-05-12 NOTE — Telephone Encounter (Signed)
Spoke to Patient regarding the Rybelsus. Patient states he saw the Endocrinologist on 05/11/22 and she recommend that he start the Rybelsus 7 mg but did not say anything about the Amaryl. Patient would like to think about weaning off of the Amaryl or coming in to discuss weaning off the Amaryl and will let us know what he decides. Patient verbalized understanding and is agreeable to starting the Rybelsus 7 mg.

## 2022-05-12 NOTE — Telephone Encounter (Signed)
Spoke to Patient.

## 2022-05-12 NOTE — Telephone Encounter (Signed)
Spoke with patient and informed him that icosapent ethyl was approved by his insurance company for a Tier 1 copay. His copay will be $15 per Tony Lawrence pharmacist. Patient expressed gratitude for the call and would like to have the Rx sent in for a 90 day supply.

## 2022-05-12 NOTE — Progress Notes (Signed)
Continue Rybelsus 7 mg daily  Would recommend discontinuing Glimepiride in future Recent A1c 7.3  Carollee Leitz, MD

## 2022-05-12 NOTE — Addendum Note (Signed)
Addended by: Raelene Bott, Aynslee Mulhall L on: 05/12/2022 02:58 PM   Modules accepted: Orders

## 2022-05-12 NOTE — Telephone Encounter (Signed)
Left message to call office back. 

## 2022-05-12 NOTE — Telephone Encounter (Signed)
LMTCB

## 2022-05-31 DIAGNOSIS — M546 Pain in thoracic spine: Secondary | ICD-10-CM | POA: Diagnosis not present

## 2022-06-02 DIAGNOSIS — C029 Malignant neoplasm of tongue, unspecified: Secondary | ICD-10-CM | POA: Diagnosis not present

## 2022-06-02 DIAGNOSIS — M546 Pain in thoracic spine: Secondary | ICD-10-CM | POA: Diagnosis not present

## 2022-06-07 DIAGNOSIS — M546 Pain in thoracic spine: Secondary | ICD-10-CM | POA: Diagnosis not present

## 2022-06-11 ENCOUNTER — Other Ambulatory Visit: Payer: Self-pay | Admitting: Internal Medicine

## 2022-06-14 DIAGNOSIS — M546 Pain in thoracic spine: Secondary | ICD-10-CM | POA: Diagnosis not present

## 2022-06-16 DIAGNOSIS — C01 Malignant neoplasm of base of tongue: Secondary | ICD-10-CM | POA: Diagnosis not present

## 2022-06-16 DIAGNOSIS — Z85810 Personal history of malignant neoplasm of tongue: Secondary | ICD-10-CM | POA: Diagnosis not present

## 2022-06-16 DIAGNOSIS — C029 Malignant neoplasm of tongue, unspecified: Secondary | ICD-10-CM | POA: Diagnosis not present

## 2022-06-17 ENCOUNTER — Other Ambulatory Visit: Payer: Self-pay | Admitting: Internal Medicine

## 2022-06-21 DIAGNOSIS — M546 Pain in thoracic spine: Secondary | ICD-10-CM | POA: Diagnosis not present

## 2022-06-23 DIAGNOSIS — D0000 Carcinoma in situ of oral cavity, unspecified site: Secondary | ICD-10-CM | POA: Insufficient documentation

## 2022-06-23 DIAGNOSIS — M546 Pain in thoracic spine: Secondary | ICD-10-CM | POA: Diagnosis not present

## 2022-06-30 ENCOUNTER — Telehealth: Payer: Self-pay | Admitting: Cardiovascular Disease

## 2022-06-30 NOTE — Telephone Encounter (Signed)
Pt c/o medication issue:  1. Name of Medication: clopidogrel (PLAVIX) 75 MG tablet   2. How are you currently taking this medication (dosage and times per day)?  TAKE 1 TABLET BY MOUTH DAILY   3. Are you having a reaction (difficulty breathing--STAT)? no  4. What is your medication issue? Patient called stating his is having oral surgery tomorrow.  He states he is having a lesion removed from his gum. Patient wants to know if he needs to stop his Plavis. His surgeon is Dr. Vira Browns at [facility], phone number [phone].

## 2022-07-01 ENCOUNTER — Telehealth: Payer: Self-pay | Admitting: Cardiovascular Disease

## 2022-07-01 NOTE — Telephone Encounter (Signed)
Unless Mr. Bigner dentist feels that he is at high risk for bleeding complications, I would recommend continuation of clopidogrel 75 mg daily throughout the periprocedural period.  Nelva Bush, MD [facility]

## 2022-07-01 NOTE — Telephone Encounter (Signed)
   Pre-operative Risk Assessment    Patient Name: Tony Lawrence  DOB: 1945-10-08 MRN: [id number]      Request for Surgical Clearance    Procedure:   excision of oral lesion  Date of Surgery:  Clearance 07/08/22                                 Surgeon:  Eustace Moore Surgeon's Group or Practice Name:  [facility] Phone number:  [phone] Fax number:  [phone]   Type of Clearance Requested:   - Medical    Type of Anesthesia:  Not Indicated   Additional requests/questions:    Manfred Arch   07/01/2022, 4:23 PM

## 2022-07-01 NOTE — Telephone Encounter (Signed)
Spoke to patient and informed him of Dr. Darnelle Bos recommendations as follows:  Unless Tony Lawrence dentist feels that he is at high risk for bleeding complications, I would recommend continuation of clopidogrel 75 mg daily throughout the periprocedural period.   Tony Bush, MD [facility]  Patient understood with read back

## 2022-07-02 ENCOUNTER — Telehealth: Payer: Self-pay | Admitting: *Deleted

## 2022-07-02 DIAGNOSIS — M5414 Radiculopathy, thoracic region: Secondary | ICD-10-CM | POA: Diagnosis not present

## 2022-07-02 DIAGNOSIS — M5134 Other intervertebral disc degeneration, thoracic region: Secondary | ICD-10-CM | POA: Diagnosis not present

## 2022-07-02 NOTE — Telephone Encounter (Signed)
Pt has been scheduled for tele pre op appt per pre op APP today to add on 07/05/22. Pt has appt 07/05/22 @ 1:40. Med rec and consent are done.     Patient Consent for Virtual Visit        [name] has provided verbal consent on 07/02/2022 for a virtual visit (video or telephone).   CONSENT FOR VIRTUAL VISIT FOR:  Tony Lawrence  By participating in this virtual visit I agree to the following:  I hereby voluntarily request, consent and authorize [place] and its employed or contracted physicians, physician assistants, nurse practitioners or other licensed health care professionals (the Practitioner), to provide me with telemedicine health care services (the "Services") as deemed necessary by the treating Practitioner. I acknowledge and consent to receive the Services by the Practitioner via telemedicine. I understand that the telemedicine visit will involve communicating with the Practitioner through live audiovisual communication technology and the disclosure of certain medical information by electronic transmission. I acknowledge that I have been given the opportunity to request an in-person assessment or other available alternative prior to the telemedicine visit and am voluntarily participating in the telemedicine visit.  I understand that I have the right to withhold or withdraw my consent to the use of telemedicine in the course of my care at any time, without affecting my right to future care or treatment, and that the Practitioner or I may terminate the telemedicine visit at any time. I understand that I have the right to inspect all information obtained and/or recorded in the course of the telemedicine visit and may receive copies of available information for a reasonable fee.  I understand that some of the potential risks of receiving the Services via telemedicine include:  Delay or interruption in medical evaluation due to technological equipment failure or  disruption; Information transmitted may not be sufficient (e.g. poor resolution of images) to allow for appropriate medical decision making by the Practitioner; and/or  In rare instances, security protocols could fail, causing a breach of personal health information.  Furthermore, I acknowledge that it is my responsibility to provide information about my medical history, conditions and care that is complete and accurate to the best of my ability. I acknowledge that Practitioner's advice, recommendations, and/or decision may be based on factors not within their control, such as incomplete or inaccurate data provided by me or distortions of diagnostic images or specimens that may result from electronic transmissions. I understand that the practice of medicine is not an exact science and that Practitioner makes no warranties or guarantees regarding treatment outcomes. I acknowledge that a copy of this consent can be made available to me via my patient portal ([facility]), or I can request a printed copy by calling the office of [place].    I understand that my insurance will be billed for this visit.   I have read or had this consent read to me. I understand the contents of this consent, which adequately explains the benefits and risks of the Services being provided via telemedicine.  I have been provided ample opportunity to ask questions regarding this consent and the Services and have had my questions answered to my satisfaction. I give my informed consent for the services to be provided through the use of telemedicine in my medical care

## 2022-07-02 NOTE — Telephone Encounter (Signed)
Pt has been scheduled for tele pre op appt per pre op APP today to add on 07/05/22. Pt has appt 07/05/22 @ 1:40. Med rec and consent are done.

## 2022-07-02 NOTE — Telephone Encounter (Signed)
Primary Cardiologist:Tony End, MD   Preoperative team, please contact this patient and set up a phone call appointment for further preoperative risk assessment. Please obtain consent and complete medication review. Thank you for your help.   Unless [name] dentist feels that he is at high risk for bleeding complications, I would recommend continuation of clopidogrel 75 mg daily throughout the periprocedural period.   Tony Bush, MD [place], NP-C  07/02/2022, 8:15 AM [address], [address] Office [phone] Fax [phone]

## 2022-07-05 ENCOUNTER — Ambulatory Visit: Payer: Medicare Other | Attending: Cardiovascular Disease

## 2022-07-05 DIAGNOSIS — Z01810 Encounter for preprocedural cardiovascular examination: Secondary | ICD-10-CM | POA: Diagnosis not present

## 2022-07-05 NOTE — Progress Notes (Signed)
Virtual Visit via Telephone Note   Because of Tony Lawrence's co-morbid illnesses, he is at least at moderate risk for complications without adequate follow up.  This format is felt to be most appropriate for this patient at this time.  The patient did not have access to video technology/had technical difficulties with video requiring transitioning to audio format only (telephone).  All issues noted in this document were discussed and addressed.  No physical exam could be performed with this format.  Please refer to the patient's chart for his consent to telehealth for [facility].  Evaluation Performed:  Preoperative cardiovascular risk assessment _____________   Date:  07/05/2022   Patient ID:  Tony Lawrence, DOB 21-Jan-1946, MRN [id number] Patient Location:  Home Provider location:   Office  Primary Care Provider:  Carollee Leitz, MD Primary Cardiologist:  Nelva Bush, MD  Chief Complaint / Patient Profile   77 y.o. y/o male with a h/o coronary artery disease with chronic total occlusion of the mid LAD, mild aortic regurgitation, mild mitral valve stenosis, hypertension, hyperlipidemia, diabetes mellitus, obstructive sleep apnea, and hypothyroidism  who is pending excision of oral lesion and presents today for telephonic preoperative cardiovascular risk assessment.  History of Present Illness    Tony Lawrence is a 77 y.o. male who presents via audio/video conferencing for a telehealth visit today.  Pt was last seen in cardiology clinic on 03/25/2022 by Dr. Saunders Revel.  At that time Tony Lawrence was doing well some mild edema with long car rides and occasional hypoglycemic episodes.  The patient is now pending procedure as outlined above. Since his last visit, he has been doing well with no new cardiac complaints since his previous visit.  He denies chest pain, shortness of breath, lower extremity edema, fatigue, palpitations, melena, hematuria, hemoptysis, diaphoresis,  weakness, presyncope, syncope, orthopnea, and PND.     Past Medical History    Past Medical History:  Diagnosis Date   Actinic keratoses    Actinic keratoses    Allergy    Aneurysm of ascending aorta without rupture ([place]) 03/27/2022   Annual physical exam 02/27/2021   Aortic valve regurgitation 03/27/2022   Arthritis    low back pain s/p shots and blocks, knee pain   Arthritis of right knee 11/15/2019   BCC (basal cell carcinoma of skin)    forehead and chest Dr. Evorn Gong q 6 months    Benign prostatic hyperplasia with urinary obstruction 03/06/2012   CAD (coronary artery disease)    CAD (coronary artery disease) 03/28/2022   Cancer of ventral surface of tongue ([place]) 11/04/2015   Chronic diarrhea [id number]   Complication of anesthesia    SEVERE SORE THROAT AFTER BIOPSY 2010   Contusion of toenail 02/27/2021   Coronary artery disease    Chronic total occlusion of mid LAD   COVID-19    11/17/20   Diabetes mellitus ([place]) 03/12/2013   Diabetes mellitus without complication ([place])    Diarrhea 03/21/2018   Erectile dysfunction due to arterial insufficiency 04/24/2019   Essential hypertension 12/22/2017   Flank pain 11/30/2013   GERD (gastroesophageal reflux disease)    H/O total knee replacement, 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   History of chicken pox    History of shingles    History of skin cancer 03/21/2018   History of tongue cancer 03/21/2018   HLD (hyperlipidemia) 03/28/2022   Hyperlipidemia    Hypertension    Hypothyroidism    Increased frequency of urination 03/12/2013   Malignant  neoplasm of tongue, tip and lateral border ([place]) 11/04/2015   Mitral valve insufficiency 12/22/2019   Neuritis or radiculitis due to rupture of lumbar intervertebral disc 01/16/2014   Onychomycosis 02/27/2021   OSA on CPAP    Other intervertebral disc displacement, lumbar region 01/16/2014   Parapelvic renal cyst 12/18/2013   Primary osteoarthritis of both knees 05/13/2014   Primary osteoarthritis of  left knee 11/22/2018   SCC (squamous cell carcinoma)    invasive left Post. lateral Tongue [facility] Dr. Ihor Austin excised 08/25/20 surgical exc unc ?recurrent vs new primary    Slowing of urinary stream 03/12/2013   Squamous cell carcinoma in situ    nose 2021    Squamous cell carcinoma in situ    nose 2021    Stroke ([place])    Thrombocytopenia ([place]) 01/27/2022   Tongue cancer ([place])    Squamous cell CA of tongue 11/11/15 no chemo or radiation ENT Dr. Tami Ribas, [facility] H/o    Tongue cancer [facility])    Squamous cell CA of tongue 11/11/15 no chemo or radiation ENT Dr. Tami Ribas, [facility] H/o    Urinary hesitancy 03/12/2013   Valvular heart disease 03/23/2021   Past Surgical History:  Procedure Laterality Date   BIOPSY TONGUE     11/11/15 SCC tongue    cancer of tongue  08/2020   removal of cancer   CARDIAC CATHETERIZATION N/A 11/10/2015   Procedure: Left Heart Cath and Coronary Angiography;  Surgeon: Wellington Hampshire, MD;  Location: [place] CV LAB;  Service: Cardiovascular;  Laterality: N/A;   COLONOSCOPY  05/2006   COLONOSCOPY WITH PROPOFOL N/A 05/24/2017   Procedure: COLONOSCOPY WITH PROPOFOL;  Surgeon: Lollie Sails, MD;  Location: [facility] ENDOSCOPY;  Service: Endoscopy;  Laterality: N/A;   EXCISION OF TONGUE LESION N/A 11/11/2015   Procedure: EXCISION OF TONGUE LESION/ WITH FROZEN SECTION;  Surgeon: Beverly Gust, MD;  Location: [facility] ORS;  Service: ENT;  Laterality: N/A;   REPLACEMENT TOTAL KNEE Left    REPLACEMENT TOTAL KNEE Right 04/2020   [place] [facility] [place] [facility]   right tka minimally invasive 05/06/20 Dr. Armandina Gemma      Allergies  Allergies  Allergen Reactions   Penicillins Hives, Rash, Other (See Comments) and Swelling    Has patient had a PCN reaction causing immediate rash, facial/tongue/throat swelling, SOB or lightheadedness with hypotension: Yes Has patient had a PCN reaction causing severe rash involving mucus membranes or skin necrosis: No Has patient had a PCN  reaction that required hospitalization No Has patient had a PCN reaction occurring within the last 10 years: No If all of the above answers are "NO", then may proceed with Cephalosporin use.     Home Medications    Prior to Admission medications   Medication Sig Start Date End Date Taking? Authorizing Provider  Acetaminophen (TYLENOL PO) Take 1,300 mg by mouth every other day.    [provider]  atorvastatin (LIPITOR) 20 MG tablet Take 1 tablet (20 mg total) by mouth daily at 6 PM. At night 09/25/21   McLean-Scocuzza, Nino Glow, MD  bisoprolol-hydrochlorothiazide (ZIAC) 2.5-6.25 MG tablet Take 1 tablet by mouth daily. 09/25/21   McLean-Scocuzza, Nino Glow, MD  clopidogrel (PLAVIX) 75 MG tablet TAKE 1 TABLET BY MOUTH DAILY 06/11/22   End, Harrell Gave, MD  co-enzyme Q-10 30 MG capsule Take 30 mg by mouth 3 (three) times daily.    [provider]  glimepiride (AMARYL) 1 MG tablet Take 1 tablet (1 mg total) by mouth daily with  breakfast. 09/25/21   McLean-Scocuzza, Nino Glow, MD  glucose blood test strip E 11.9 qd Once Daily one touch 09/25/21   McLean-Scocuzza, Nino Glow, MD  icosapent Ethyl (VASCEPA) 1 g capsule Take 2 capsules (2 g total) by mouth 2 (two) times daily. 05/12/22   End, Harrell Gave, MD  isosorbide mononitrate (IMDUR) 30 MG 24 hr tablet TAKE ONE TABLET BY MOUTH EVERY MORNING AND TAKE TWO TABLETS BY MOUTH EVERY EVENING 06/17/22   End, Harrell Gave, MD  Lancets [facility] ULTRASOFT) lancets Qd E 11.9 Once daily 09/25/21   McLean-Scocuzza, Nino Glow, MD  levothyroxine (SYNTHROID) 200 MCG tablet Take 1 tablet (200 mcg total) by mouth daily. In am on empty stomach 09/25/21   McLean-Scocuzza, Nino Glow, MD  liothyronine (CYTOMEL) 5 MCG tablet Take 0.5 tablets (2.5 mcg total) by mouth every other day. 09/25/21   McLean-Scocuzza, Nino Glow, MD  losartan (COZAAR) 50 MG tablet Take 1 tablet (50 mg total) by mouth in the morning and at bedtime. Stop ramipril 20 mg qd, d/c losartan 25 mg qd 03/25/22   End,  Harrell Gave, MD  Multiple Vitamins-Minerals (CENTRUM SILVER 50+MEN PO) Take 1 tablet by mouth every morning.     [provider]  OneTouch Delica Lancets [id number] MISC Check blood sugars twice weekly 04/06/22   Carollee Leitz, MD  pantoprazole (PROTONIX) 40 MG tablet Take 1 tablet (40 mg total) by mouth daily. 09/25/21   McLean-Scocuzza, Nino Glow, MD  Semaglutide (RYBELSUS) 7 MG TABS Take 7 mg by mouth daily. Patient not taking: Reported on 07/02/2022 05/12/22   Carollee Leitz, MD    Physical Exam    Vital Signs:  Traver Fazzini Hayworth does not have vital signs available for review today.132/64  Given telephonic nature of communication, physical exam is limited. AAOx3. NAD. Normal affect.  Speech and respirations are unlabored.  Accessory Clinical Findings    None  Assessment & Plan    1.  Preoperative Cardiovascular Risk Assessment:   Mr. Vanlenten perioperative risk of a major cardiac event is 0.9% according to the Revised Cardiac Risk Index (RCRI).  Therefore, he is at low risk for perioperative complications.   His functional capacity is fair at 4.31 METs according to the [place] Activity Status Index (DASI). Recommendations: According to ACC/AHA guidelines, no further cardiovascular testing needed.  The patient may proceed to surgery at acceptable risk.   Antiplatelet and/or Anticoagulation Recommendations: Clopidogrel (Plavix) can be held for 5 days prior to his surgery and resumed as soon as possible post op.  Advised that the recommendation would be to continue however he stated that the surgeon would like for the medication to be held.  I advised him based on our protocol to hold the Plavix as noted above.    The patient was advised that if he develops new symptoms prior to surgery to contact our office to arrange for a follow-up visit, and he verbalized understanding.  A copy of this note will be routed to requesting surgeon.  Time:   Today, I have spent 6 minutes with the patient with  telehealth technology discussing medical history, symptoms, and management plan.     Mable Fill, Marissa Nestle, NP  07/05/2022, 7:48 AM

## 2022-07-07 DIAGNOSIS — M546 Pain in thoracic spine: Secondary | ICD-10-CM | POA: Diagnosis not present

## 2022-07-08 ENCOUNTER — Ambulatory Visit: Payer: Medicare Other | Admitting: Family Medicine

## 2022-07-08 DIAGNOSIS — G4730 Sleep apnea, unspecified: Secondary | ICD-10-CM | POA: Diagnosis not present

## 2022-07-08 DIAGNOSIS — Z7902 Long term (current) use of antithrombotics/antiplatelets: Secondary | ICD-10-CM | POA: Diagnosis not present

## 2022-07-08 DIAGNOSIS — Z79899 Other long term (current) drug therapy: Secondary | ICD-10-CM | POA: Diagnosis not present

## 2022-07-08 DIAGNOSIS — N189 Chronic kidney disease, unspecified: Secondary | ICD-10-CM | POA: Diagnosis not present

## 2022-07-08 DIAGNOSIS — E1122 Type 2 diabetes mellitus with diabetic chronic kidney disease: Secondary | ICD-10-CM | POA: Diagnosis not present

## 2022-07-08 DIAGNOSIS — D0000 Carcinoma in situ of oral cavity, unspecified site: Secondary | ICD-10-CM | POA: Diagnosis not present

## 2022-07-08 DIAGNOSIS — Z6833 Body mass index (BMI) 33.0-33.9, adult: Secondary | ICD-10-CM | POA: Diagnosis not present

## 2022-07-08 DIAGNOSIS — K1329 Other disturbances of oral epithelium, including tongue: Secondary | ICD-10-CM | POA: Diagnosis not present

## 2022-07-08 DIAGNOSIS — Z79890 Hormone replacement therapy (postmenopausal): Secondary | ICD-10-CM | POA: Diagnosis not present

## 2022-07-08 DIAGNOSIS — E785 Hyperlipidemia, unspecified: Secondary | ICD-10-CM | POA: Diagnosis not present

## 2022-07-08 DIAGNOSIS — E039 Hypothyroidism, unspecified: Secondary | ICD-10-CM | POA: Diagnosis not present

## 2022-07-08 DIAGNOSIS — I129 Hypertensive chronic kidney disease with stage 1 through stage 4 chronic kidney disease, or unspecified chronic kidney disease: Secondary | ICD-10-CM | POA: Diagnosis not present

## 2022-07-08 DIAGNOSIS — K1379 Other lesions of oral mucosa: Secondary | ICD-10-CM | POA: Diagnosis not present

## 2022-07-08 DIAGNOSIS — E669 Obesity, unspecified: Secondary | ICD-10-CM | POA: Diagnosis not present

## 2022-07-08 DIAGNOSIS — G459 Transient cerebral ischemic attack, unspecified: Secondary | ICD-10-CM | POA: Diagnosis not present

## 2022-07-12 DIAGNOSIS — M546 Pain in thoracic spine: Secondary | ICD-10-CM | POA: Diagnosis not present

## 2022-07-14 ENCOUNTER — Ambulatory Visit (INDEPENDENT_AMBULATORY_CARE_PROVIDER_SITE_OTHER): Payer: Medicare Other | Admitting: Family Medicine

## 2022-07-14 VITALS — BP 138/60 | HR 56 | Temp 97.7°F | Ht 70.0 in | Wt 245.6 lb

## 2022-07-14 DIAGNOSIS — I152 Hypertension secondary to endocrine disorders: Secondary | ICD-10-CM

## 2022-07-14 DIAGNOSIS — E785 Hyperlipidemia, unspecified: Secondary | ICD-10-CM | POA: Diagnosis not present

## 2022-07-14 DIAGNOSIS — E1159 Type 2 diabetes mellitus with other circulatory complications: Secondary | ICD-10-CM | POA: Diagnosis not present

## 2022-07-14 DIAGNOSIS — M546 Pain in thoracic spine: Secondary | ICD-10-CM | POA: Diagnosis not present

## 2022-07-14 DIAGNOSIS — E039 Hypothyroidism, unspecified: Secondary | ICD-10-CM | POA: Diagnosis not present

## 2022-07-14 DIAGNOSIS — E118 Type 2 diabetes mellitus with unspecified complications: Secondary | ICD-10-CM

## 2022-07-14 DIAGNOSIS — E1169 Type 2 diabetes mellitus with other specified complication: Secondary | ICD-10-CM | POA: Diagnosis not present

## 2022-07-14 DIAGNOSIS — C029 Malignant neoplasm of tongue, unspecified: Secondary | ICD-10-CM

## 2022-07-14 NOTE — Patient Instructions (Addendum)
It was a pleasure meeting you today. Thank you for allowing me to take part in your health care.  Our goals for today as we discussed include:  Foot exam completed today.  Due again annually.  Follow-up with endocrinology for your diabetes management. Continue to monitor your sugars at home. Continue healthy eating habits. Continue to remain active daily.  Follow-up with your oral surgeon to discuss results of recent biopsy.  Continue physical therapy for back pain.  Glad to hear that this is improved since starting physical therapy.  Blood pressure good. Continue current medication. If any chest pain, shortness of breath, dizziness or weakness please follow-up with cardiology or PCP.  We will repeat thyroid level in October 2024. Please schedule appointment 6 to 7 months for follow up with PCP  If you have any questions or concerns, please do not hesitate to call the office at [phone].  I look forward to our next visit and until then take care and stay safe.  Regards,   Carollee Leitz, MD   [facility]

## 2022-07-14 NOTE — Progress Notes (Signed)
SUBJECTIVE:   Chief Complaint  Patient presents with   Medical Management of Chronic Issues    3 month follow up    HPI Patient presents for 36-month follow-up disease management  No acute concerns today.  Diabetes type 2 Follows with endocrinology, Dr. Patsey Berthold.  Reports CBGs at home fasting between 110-128.  Postmeal less than 180.  Has stopped Rybelsus about 1 month ago secondary to financial cost. Currently on Amaryl 1 mg daily.  Reports has been portion controlling his meals and activity has increased.  Reports thinks weight has remained stable.  On statin therapy and ARB.  Hypothyroid Asymptomatic.  Currently on levothyroxine 200 mg daily and liothyronine 5 mcg every other day.  Recent TSH normal.  Hypertension Asymptomatic.  Currently on Ziac 2.5-6.25 mg daily and losartan 50 mg twice daily.  Tolerating well.  PERTINENT PMH / PSH: Hypothyroid Diabetes type 2 Hypertension Hyperlipidemia   OBJECTIVE:  BP 138/60   Pulse (!) 56   Temp 97.7 F (36.5 C) (Oral)   Ht 5\' 10"  (1.778 m)   Wt 245 lb 9.6 oz (111.4 kg)   SpO2 96%   BMI 35.24 kg/m    Physical Exam Vitals reviewed.  Constitutional:      General: He is not in acute distress.    Appearance: Normal appearance. He is obese. He is not ill-appearing, toxic-appearing or diaphoretic.  Eyes:     General:        Right eye: No discharge.        Left eye: No discharge.  Cardiovascular:     Rate and Rhythm: Normal rate and regular rhythm.     Heart sounds: Normal heart sounds.  Pulmonary:     Effort: Pulmonary effort is normal.     Breath sounds: Normal breath sounds.  Musculoskeletal:        General: Normal range of motion.     Cervical back: Normal range of motion.  Skin:    General: Skin is warm and dry.  Neurological:     Mental Status: He is alert and oriented to person, place, and time. Mental status is at baseline.  Psychiatric:        Mood and Affect: Mood normal.        Behavior: Behavior normal.         Thought Content: Thought content normal.        Judgment: Judgment normal.     ASSESSMENT/PLAN:  Hypertension associated with diabetes ([place]) Assessment & Plan: Chronic.  Stable.  Well-controlled. Continue losartan 50 mg twice daily Continue Ziac 2.5-6.25 mg daily CMET at next visit   Hyperlipidemia associated with type 2 diabetes mellitus ([place]) Assessment & Plan: Chronic.  Stable.  At goal LDL less than 70. Continue Lipitor 20 mg daily Check lipids annually   Tongue cancer ([place]) Assessment & Plan: Recent biopsy of tongue.  Patient awaiting follow-up for discussion of report with Dr. Frazier Butt at [place]   Diabetes mellitus with complication [facility]) Assessment & Plan: Chronic.  Stable.  Recently stopped Rybelsus 1 month ago.  Weight has increased 10 pounds since last visit.  Reports taking glimepiride 1 mg daily. Per recent note from endocrinology patient taking glimepiride 2 mg daily. Can follow up PA Rodriguez-Gonzalez at [facility] clinic clarify dose of medication and further refills.    Hypothyroidism, unspecified type Assessment & Plan: Chronic.  Stable.  Not followed by endocrinology.  Currently takes Synthroid 200 mcg daily and liothyronine 2.5 mg daily.  Recent TSH 1.45 TSH  due in October.  Would prefer to switch to monotherapy with Synthroid.  Will discuss with patient at next visit.        PDMP reviewed  Return in about 7 months (around 02/13/2023) for PCP.  Carollee Leitz, MD

## 2022-07-16 DIAGNOSIS — C029 Malignant neoplasm of tongue, unspecified: Secondary | ICD-10-CM | POA: Diagnosis not present

## 2022-07-16 DIAGNOSIS — K122 Cellulitis and abscess of mouth: Secondary | ICD-10-CM | POA: Diagnosis not present

## 2022-07-16 DIAGNOSIS — K0889 Other specified disorders of teeth and supporting structures: Secondary | ICD-10-CM | POA: Diagnosis not present

## 2022-07-17 ENCOUNTER — Encounter: Payer: Self-pay | Admitting: Family Medicine

## 2022-07-17 NOTE — Assessment & Plan Note (Signed)
Chronic.  Stable.  Well-controlled. Continue losartan 50 mg twice daily Continue Ziac 2.5-6.25 mg daily CMET at next visit

## 2022-07-17 NOTE — Assessment & Plan Note (Signed)
Chronic.  Stable.  Not followed by endocrinology.  Currently takes Synthroid 200 mcg daily and liothyronine 2.5 mg daily.  Recent TSH 1.45 TSH due in October.  Would prefer to switch to monotherapy with Synthroid.  Will discuss with patient at next visit.

## 2022-07-17 NOTE — Assessment & Plan Note (Signed)
Recent biopsy of tongue.  Patient awaiting follow-up for discussion of report with Dr. Frazier Butt at [place]

## 2022-07-17 NOTE — Assessment & Plan Note (Signed)
Chronic.  Stable.  At goal LDL less than 70. Continue Lipitor 20 mg daily Check lipids annually

## 2022-07-17 NOTE — Assessment & Plan Note (Addendum)
Chronic.  Stable.  Recently stopped Rybelsus 1 month ago.  Weight has increased 10 pounds since last visit.  Reports taking glimepiride 1 mg daily. Per recent note from endocrinology patient taking glimepiride 2 mg daily. Can follow up PA Rodriguez-Gonzalez at [facility] clinic clarify dose of medication and further refills.

## 2022-07-19 DIAGNOSIS — M546 Pain in thoracic spine: Secondary | ICD-10-CM | POA: Diagnosis not present

## 2022-07-21 DIAGNOSIS — M546 Pain in thoracic spine: Secondary | ICD-10-CM | POA: Diagnosis not present

## 2022-07-26 ENCOUNTER — Ambulatory Visit: Payer: Medicare Other | Admitting: Internal Medicine

## 2022-07-29 ENCOUNTER — Telehealth: Payer: Self-pay

## 2022-07-29 NOTE — Telephone Encounter (Signed)
Lab reminder letter mailed to pt 

## 2022-07-29 NOTE — Telephone Encounter (Signed)
Opened in error

## 2022-08-04 DIAGNOSIS — M546 Pain in thoracic spine: Secondary | ICD-10-CM | POA: Diagnosis not present

## 2022-08-06 DIAGNOSIS — D0000 Carcinoma in situ of oral cavity, unspecified site: Secondary | ICD-10-CM | POA: Diagnosis not present

## 2022-08-06 DIAGNOSIS — C029 Malignant neoplasm of tongue, unspecified: Secondary | ICD-10-CM | POA: Diagnosis not present

## 2022-08-06 DIAGNOSIS — Z09 Encounter for follow-up examination after completed treatment for conditions other than malignant neoplasm: Secondary | ICD-10-CM | POA: Diagnosis not present

## 2022-08-09 ENCOUNTER — Other Ambulatory Visit: Payer: Self-pay | Admitting: *Deleted

## 2022-08-09 ENCOUNTER — Inpatient Hospital Stay: Payer: Medicare Other | Attending: Oncology

## 2022-08-09 ENCOUNTER — Encounter: Payer: Self-pay | Admitting: Oncology

## 2022-08-09 ENCOUNTER — Inpatient Hospital Stay (HOSPITAL_BASED_OUTPATIENT_CLINIC_OR_DEPARTMENT_OTHER): Payer: Medicare Other | Admitting: Oncology

## 2022-08-09 VITALS — BP 132/64 | HR 55 | Temp 98.3°F | Resp 18 | Ht 70.0 in | Wt 244.0 lb

## 2022-08-09 DIAGNOSIS — D696 Thrombocytopenia, unspecified: Secondary | ICD-10-CM | POA: Insufficient documentation

## 2022-08-09 DIAGNOSIS — C760 Malignant neoplasm of head, face and neck: Secondary | ICD-10-CM

## 2022-08-09 DIAGNOSIS — Z85810 Personal history of malignant neoplasm of tongue: Secondary | ICD-10-CM | POA: Insufficient documentation

## 2022-08-09 LAB — CBC WITH DIFFERENTIAL/PLATELET
Abs Immature Granulocytes: 0.04 10*3/uL (ref 0.00–0.07)
Basophils Absolute: 0 10*3/uL (ref 0.0–0.1)
Basophils Relative: 1 %
Eosinophils Absolute: 0.1 10*3/uL (ref 0.0–0.5)
Eosinophils Relative: 1 %
HCT: 44 % (ref 39.0–52.0)
Hemoglobin: 14.9 g/dL (ref 13.0–17.0)
Immature Granulocytes: 1 %
Lymphocytes Relative: 28 %
Lymphs Abs: 2.1 10*3/uL (ref 0.7–4.0)
MCH: 31 pg (ref 26.0–34.0)
MCHC: 33.9 g/dL (ref 30.0–36.0)
MCV: 91.7 fL (ref 80.0–100.0)
Monocytes Absolute: 0.6 10*3/uL (ref 0.1–1.0)
Monocytes Relative: 8 %
Neutro Abs: 4.7 10*3/uL (ref 1.7–7.7)
Neutrophils Relative %: 61 %
Platelets: 173 10*3/uL (ref 150–400)
RBC: 4.8 MIL/uL (ref 4.22–5.81)
RDW: 13.9 % (ref 11.5–15.5)
WBC: 7.5 10*3/uL (ref 4.0–10.5)
nRBC: 0 % (ref 0.0–0.2)

## 2022-08-09 LAB — FOLATE: Folate: 25 ng/mL (ref >5.9)

## 2022-08-09 LAB — HIV ANTIBODY (ROUTINE TESTING W REFLEX): HIV Screen 4th Generation wRfx: NONREACTIVE

## 2022-08-09 LAB — VITAMIN B12: Vitamin B-12: 249 pg/mL (ref 180–914)

## 2022-08-09 LAB — HEPATITIS C ANTIBODY: HCV Ab: NONREACTIVE

## 2022-08-09 NOTE — Progress Notes (Signed)
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  He was then diagnosed with recurrent disease in May 2022 and underwent left partial glossectomy, left neck dissection at [facility] with Dr. Augusto Garbe.  Pathology showed 2.1 cm squamous cell carcinoma with a positive LVI, negative PNI.  Clear margins.  1 out of 32 lymph nodes involved.  No evidence of extra nuclear extension.  He decided to forego adjuvant radiation therapy.   Patient noted to have mild thrombocytopenia with a platelet count in the 140s on 2 occasions in October 2023.  Preceding that patient's platelet counts white count and hemoglobin have been normal.  Interval history-patient has been following up with [facility] closely with his history of tongue cancer.  He was found to have evidence of dysplasia and underwent excision for the same.  ECOG PS- 1 Pain scale- 0   Review of systems- Review of Systems  Constitutional:  Negative for chills, fever, malaise/fatigue and weight loss.  HENT:  Negative for congestion, ear discharge and nosebleeds.   Eyes:  Negative for blurred vision.  Respiratory:  Negative for cough, hemoptysis, sputum production, shortness of breath and wheezing.   Cardiovascular:  Negative for chest pain, palpitations, orthopnea and claudication.  Gastrointestinal:  Negative for abdominal pain, blood in stool, constipation, diarrhea, heartburn, melena,  nausea and vomiting.  Genitourinary:  Negative for dysuria, flank pain, frequency, hematuria and urgency.  Musculoskeletal:  Negative for back pain, joint pain and myalgias.  Skin:  Negative for rash.  Neurological:  Negative for dizziness, tingling, focal weakness, seizures, weakness and headaches.  Endo/Heme/Allergies:  Does not bruise/bleed easily.  Psychiatric/Behavioral:  Negative for depression and suicidal ideas. The patient does not have insomnia.       Allergies  Allergen Reactions   Penicillins Hives, Rash, Other (See Comments) and Swelling    Has patient had a PCN reaction causing immediate rash, facial/tongue/throat swelling, SOB or lightheadedness with hypotension: Yes Has patient had a PCN reaction causing severe rash involving mucus membranes or skin necrosis: No Has patient had a PCN reaction that required hospitalization No Has patient had a PCN reaction occurring within the last 10 years: No If all of the above answers are "NO", then may proceed with Cephalosporin use.      Past Medical History:  Diagnosis Date   Actinic keratoses    Actinic keratoses    Allergy    Aneurysm of ascending aorta without rupture 03/27/2022   Annual physical exam 02/27/2021   Aortic valve regurgitation 03/27/2022   Arthritis    low back pain s/p shots and blocks, knee pain   Arthritis of right knee 11/15/2019   BCC (basal cell carcinoma of skin)    forehead and chest Dr. Adolphus Birchwood q 6 months    Benign prostatic hyperplasia with urinary obstruction 03/06/2012   CAD (coronary artery disease)    CAD (coronary artery disease) 03/28/2022   Cancer of ventral surface of tongue 11/04/2015   Chronic diarrhea 12/29/2021   Complication  of anesthesia    SEVERE SORE THROAT AFTER BIOPSY 2010   Contusion of toenail 02/27/2021   Coronary artery disease    Chronic total occlusion of mid LAD   COVID-19    11/17/20   Diabetes mellitus 03/12/2013   Diabetes mellitus without complication     Diarrhea 03/21/2018   Erectile dysfunction due to arterial insufficiency 04/24/2019   Essential hypertension 12/22/2017   Flank pain 11/30/2013   GERD (gastroesophageal reflux disease)    H/O total knee replacement, 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   History of chicken pox    History of shingles    History of skin cancer 03/21/2018   History of tongue cancer 03/21/2018   HLD (hyperlipidemia) 03/28/2022   Hyperlipidemia    Hypertension    Hypothyroidism    Increased frequency of urination 03/12/2013   Malignant neoplasm of tongue, tip and lateral border 11/04/2015   Mitral valve insufficiency 12/22/2019   Neuritis or radiculitis due to rupture of lumbar intervertebral disc 01/16/2014   Onychomycosis 02/27/2021   OSA on CPAP    Other intervertebral disc displacement, lumbar region 01/16/2014   Parapelvic renal cyst 12/18/2013   Primary osteoarthritis of both knees 05/13/2014   Primary osteoarthritis of left knee 11/22/2018   SCC (squamous cell carcinoma)    invasive left Post. lateral Tongue [facility] Dr. Roma Schanz excised 08/25/20 surgical exc unc ?recurrent vs new primary    Slowing of urinary stream 03/12/2013   Squamous cell carcinoma in situ    nose 2021    Squamous cell carcinoma in situ    nose 2021    Stroke    Thrombocytopenia 01/27/2022   Tongue cancer    Squamous cell CA of tongue 11/11/15 no chemo or radiation ENT Dr. Jenne Campus, [facility] H/o    Tongue cancer    Squamous cell CA of tongue 11/11/15 no chemo or radiation ENT Dr. Jenne Campus, [facility] H/o    Urinary hesitancy 03/12/2013   Valvular heart disease 03/23/2021     Past Surgical History:  Procedure Laterality Date   BIOPSY TONGUE     11/11/15 SCC tongue    cancer of tongue  08/2020   removal of cancer   CARDIAC CATHETERIZATION N/A 11/10/2015   Procedure: Left Heart Cath and Coronary Angiography;  Surgeon: Iran Ouch, MD;  Location: [facility] INVASIVE CV LAB;  Service: Cardiovascular;  Laterality: N/A;   COLONOSCOPY  05/2006   COLONOSCOPY WITH  PROPOFOL N/A 05/24/2017   Procedure: COLONOSCOPY WITH PROPOFOL;  Surgeon: Christena Deem, MD;  Location: [facility] ENDOSCOPY;  Service: Endoscopy;  Laterality: N/A;   EXCISION OF TONGUE LESION N/A 11/11/2015   Procedure: EXCISION OF TONGUE LESION/ WITH FROZEN SECTION;  Surgeon: Linus Salmons, MD;  Location: [facility] ORS;  Service: ENT;  Laterality: N/A;   REPLACEMENT TOTAL KNEE Left    REPLACEMENT TOTAL KNEE Right 04/2020   [facility]   right tka minimally invasive 05/06/20 Dr. Laren Everts      Social History   Socioeconomic History   Marital status: Married    Spouse name: Not on file   Number of children: Not on file   Years of education: Not on file   Highest education level: Not on file  Occupational History   Not on file  Tobacco Use   Smoking status: Never   Smokeless tobacco: Never  Vaping Use   Vaping Use: Never used  Substance and Sexual Activity   Alcohol use: Never    Alcohol/week: 0.0 standard drinks of alcohol  Drug use: No   Sexual activity: Yes  Other Topics Concern   Not on file  Social History Narrative   Married    3 sons    Engineer, maintenance (IT), former businessman in Designer, fashion/clothing retired    Owns guns, wears seat belt, safe in relationship   Social Determinants of Health   Financial Resource Strain: Low Risk  (04/28/2022)   Overall Financial Resource Strain (CARDIA)    Difficulty of Paying Living Expenses: Not hard at all  Food Insecurity: No Food Insecurity (04/28/2022)   Hunger Vital Sign    Worried About Running Out of Food in the Last Year: Never true    Ran Out of Food in the Last Year: Never true  Transportation Needs: No Transportation Needs (04/28/2022)   PRAPARE - Administrator, Civil Service (Medical): No    Lack of Transportation (Non-Medical): No  Physical Activity: Insufficiently Active (04/28/2022)   Exercise Vital Sign    Days of Exercise per Week: 3 days    Minutes of Exercise per Session: 30 min  Stress: No  Stress Concern Present (04/28/2022)   [employer] of Occupational Health - Occupational Stress Questionnaire    Feeling of Stress : Not at all  Social Connections: Moderately Integrated (04/28/2022)   Social Connection and Isolation Panel [NHANES]    Frequency of Communication with Friends and Family: More than three times a week    Frequency of Social Gatherings with Friends and Family: More than three times a week    Attends Religious Services: More than 4 times per year    Active Member of [employer] or Organizations: No    Attends Banker Meetings: Never    Marital Status: Married  Catering manager Violence: Not At Risk (04/28/2022)   Humiliation, Afraid, Rape, and Kick questionnaire    Fear of Current or Ex-Partner: No    Emotionally Abused: No    Physically Abused: No    Sexually Abused: No    Family History  Problem Relation Age of Onset   Myelodysplastic syndrome Mother    Arthritis Mother    Emphysema Father    Alcohol abuse Father    COPD Father    Depression Father    Early death Maternal Grandfather    Heart disease Maternal Grandfather    Stroke Maternal Grandfather      Current Outpatient Medications:    Acetaminophen (TYLENOL PO), Take 1,300 mg by mouth every other day., Disp: , Rfl:    atorvastatin (LIPITOR) 20 MG tablet, Take 1 tablet (20 mg total) by mouth daily at 6 PM. At night, Disp: 90 tablet, Rfl: 3   bisoprolol-hydrochlorothiazide (ZIAC) 2.5-6.25 MG tablet, Take 1 tablet by mouth daily., Disp: 90 tablet, Rfl: 3   clopidogrel (PLAVIX) 75 MG tablet, TAKE 1 TABLET BY MOUTH DAILY, Disp: 90 tablet, Rfl: 0   co-enzyme Q-10 30 MG capsule, Take 30 mg by mouth 3 (three) times daily., Disp: , Rfl:    gabapentin (NEURONTIN) 100 MG capsule, Take 100 mg by mouth 3 (three) times daily., Disp: , Rfl:    glimepiride (AMARYL) 1 MG tablet, Take 1 tablet (1 mg total) by mouth daily with breakfast., Disp: 90 tablet, Rfl: 3   glucose blood test strip, E 11.9 qd  Once Daily one touch, Disp: 100 each, Rfl: 12   icosapent Ethyl (VASCEPA) 1 g capsule, Take 2 capsules (2 g total) by mouth 2 (two) times daily., Disp: 360 capsule, Rfl: 1   isosorbide mononitrate (  IMDUR) 30 MG 24 hr tablet, TAKE ONE TABLET BY MOUTH EVERY MORNING AND TAKE TWO TABLETS BY MOUTH EVERY EVENING, Disp: 270 tablet, Rfl: 0   Lancets (ONETOUCH ULTRASOFT) lancets, Qd E 11.9 Once daily, Disp: 100 each, Rfl: 12   levothyroxine (SYNTHROID) 200 MCG tablet, Take 1 tablet (200 mcg total) by mouth daily. In am on empty stomach, Disp: 90 tablet, Rfl: 3   liothyronine (CYTOMEL) 5 MCG tablet, Take 0.5 tablets (2.5 mcg total) by mouth every other day., Disp: 45 tablet, Rfl: 3   losartan (COZAAR) 50 MG tablet, Take 1 tablet (50 mg total) by mouth in the morning and at bedtime. Stop ramipril 20 mg qd, d/c losartan 25 mg qd, Disp: 180 tablet, Rfl: 3   Multiple Vitamins-Minerals (CENTRUM SILVER 50+MEN PO), Take 1 tablet by mouth every morning. , Disp: , Rfl:    OneTouch Delica Lancets 33G MISC, Check blood sugars twice weekly, Disp: 100 each, Rfl: 1   oxyCODONE (OXY IR/ROXICODONE) 5 MG immediate release tablet, Take by mouth., Disp: , Rfl:    pantoprazole (PROTONIX) 40 MG tablet, Take 1 tablet (40 mg total) by mouth daily., Disp: 90 tablet, Rfl: 3   traMADol (ULTRAM) 50 MG tablet, Take by mouth every 6 (six) hours as needed., Disp: , Rfl:   Physical exam:  Vitals:   08/09/22 1152  BP: 132/64  Pulse: (!) 55  Resp: 18  Temp: 98.3 F (36.8 C)  TempSrc: Tympanic  SpO2: 99%  Weight: 244 lb (110.7 kg)  Height:  (1.778 m)   Physical Exam Cardiovascular:     Rate and Rhythm: Normal rate and regular rhythm.     Heart sounds: Normal heart sounds.  Pulmonary:     Effort: Pulmonary effort is normal.  Skin:    General: Skin is warm and dry.  Neurological:     Mental Status: He is alert and oriented to person, place, and time.         Latest Ref Rng & Units 05/03/2022    2:19 PM  CMP   Glucose 70 - 99 mg/dL 161   BUN 8 - 23 mg/dL 14   Creatinine 0.96 - 1.24 mg/dL 0.45   Sodium 409 - 811 mmol/L 136   Potassium 3.5 - 5.1 mmol/L 3.8   Chloride 98 - 111 mmol/L 100   CO2 22 - 32 mmol/L 29   Calcium 8.9 - 10.3 mg/dL 8.6       Latest Ref Rng & Units 08/09/2022   11:19 AM  CBC  WBC 4.0 - 10.5 K/uL 7.5   Hemoglobin 13.0 - 17.0 g/dL 91.4   Hematocrit 78.2 - 52.0 % 44.0   Platelets 150 - 400 K/uL 173      Assessment and plan- Patient is a 77 y.o. male referred for thrombocytopenia  Patient's platelet counts have been normal on most occasions except on 2 occasions when it was mildly low in the 140s.  This does not require any follow up with hematology and patient can continue to follow-up with his primary care provider.  We have checked HIV hep C folate levels today which are still pending.  Overall in the absence of other cytopenias and normal platelet counts he does not require follow-up with me   Visit Diagnosis 1. Thrombocytopenia      Dr. Owens Shark, MD, MPH [facility] [phone] 08/09/2022 12:49 PM

## 2022-08-10 ENCOUNTER — Encounter: Payer: Self-pay | Admitting: Oncology

## 2022-08-11 ENCOUNTER — Ambulatory Visit (INDEPENDENT_AMBULATORY_CARE_PROVIDER_SITE_OTHER): Payer: Medicare Other

## 2022-08-11 DIAGNOSIS — G4733 Obstructive sleep apnea (adult) (pediatric): Secondary | ICD-10-CM

## 2022-08-11 NOTE — Progress Notes (Signed)
95 percentile pressure 7.9   95th percentile leak 21.6   apnea index 0.6 /hr  apnea-hypopnea index  6.0 /hr   total days used  >4 hr 90 days  total days used <4 hr 0 days  Total compliance 100 percent  He is doing good but feels he is opening mouth when lays on his back would like to try a full face mask. He will need a new order from Dr. Welton Flakes   Pt was seen by Tresa Endo  RRT/RCP  from [facility]

## 2022-08-12 ENCOUNTER — Ambulatory Visit: Payer: Medicare Other | Admitting: Internal Medicine

## 2022-08-16 DIAGNOSIS — E1169 Type 2 diabetes mellitus with other specified complication: Secondary | ICD-10-CM | POA: Diagnosis not present

## 2022-08-16 DIAGNOSIS — R809 Proteinuria, unspecified: Secondary | ICD-10-CM | POA: Diagnosis not present

## 2022-08-16 DIAGNOSIS — E1165 Type 2 diabetes mellitus with hyperglycemia: Secondary | ICD-10-CM | POA: Diagnosis not present

## 2022-08-16 DIAGNOSIS — I152 Hypertension secondary to endocrine disorders: Secondary | ICD-10-CM | POA: Diagnosis not present

## 2022-08-16 DIAGNOSIS — E785 Hyperlipidemia, unspecified: Secondary | ICD-10-CM | POA: Diagnosis not present

## 2022-08-16 DIAGNOSIS — E1159 Type 2 diabetes mellitus with other circulatory complications: Secondary | ICD-10-CM | POA: Diagnosis not present

## 2022-08-16 DIAGNOSIS — E1129 Type 2 diabetes mellitus with other diabetic kidney complication: Secondary | ICD-10-CM | POA: Diagnosis not present

## 2022-08-18 DIAGNOSIS — M546 Pain in thoracic spine: Secondary | ICD-10-CM | POA: Diagnosis not present

## 2022-08-25 DIAGNOSIS — M546 Pain in thoracic spine: Secondary | ICD-10-CM | POA: Diagnosis not present

## 2022-08-30 DIAGNOSIS — M546 Pain in thoracic spine: Secondary | ICD-10-CM | POA: Diagnosis not present

## 2022-09-07 ENCOUNTER — Ambulatory Visit: Payer: Medicare Other | Admitting: Internal Medicine

## 2022-09-08 ENCOUNTER — Other Ambulatory Visit: Payer: Self-pay | Admitting: Internal Medicine

## 2022-09-08 DIAGNOSIS — C029 Malignant neoplasm of tongue, unspecified: Secondary | ICD-10-CM | POA: Diagnosis not present

## 2022-09-08 DIAGNOSIS — M546 Pain in thoracic spine: Secondary | ICD-10-CM | POA: Diagnosis not present

## 2022-09-08 DIAGNOSIS — D0000 Carcinoma in situ of oral cavity, unspecified site: Secondary | ICD-10-CM | POA: Diagnosis not present

## 2022-09-09 ENCOUNTER — Encounter: Payer: Self-pay | Admitting: Nurse Practitioner

## 2022-09-09 ENCOUNTER — Ambulatory Visit (INDEPENDENT_AMBULATORY_CARE_PROVIDER_SITE_OTHER): Payer: Medicare Other | Admitting: Nurse Practitioner

## 2022-09-09 ENCOUNTER — Other Ambulatory Visit
Admission: RE | Admit: 2022-09-09 | Discharge: 2022-09-09 | Disposition: A | Discharge status: Home / Self Care | Payer: Medicare Other | Source: Ambulatory Visit | Attending: Internal Medicine | Admitting: Internal Medicine

## 2022-09-09 VITALS — BP 155/88 | HR 64 | Temp 98.1°F | Resp 16 | Ht 70.0 in | Wt 247.2 lb

## 2022-09-09 DIAGNOSIS — Z79899 Other long term (current) drug therapy: Secondary | ICD-10-CM

## 2022-09-09 DIAGNOSIS — Z7189 Other specified counseling: Secondary | ICD-10-CM

## 2022-09-09 DIAGNOSIS — Z85819 Personal history of malignant neoplasm of unspecified site of lip, oral cavity, and pharynx: Secondary | ICD-10-CM

## 2022-09-09 DIAGNOSIS — K219 Gastro-esophageal reflux disease without esophagitis: Secondary | ICD-10-CM

## 2022-09-09 DIAGNOSIS — G4733 Obstructive sleep apnea (adult) (pediatric): Secondary | ICD-10-CM | POA: Diagnosis not present

## 2022-09-09 LAB — LIPID PANEL
Cholesterol: 92 mg/dL (ref 0–200)
HDL: 26 mg/dL — ABNORMAL LOW (ref >40)
LDL Cholesterol: 36 mg/dL (ref 0–99)
Total CHOL/HDL Ratio: 3.5 RATIO
Triglycerides: 150 mg/dL — ABNORMAL HIGH (ref <150)
VLDL: 30 mg/dL (ref 0–40)

## 2022-09-09 LAB — ALT: ALT: 28 U/L (ref 0–44)

## 2022-09-09 NOTE — Progress Notes (Signed)
[facility] [address] [place], [place] [address]  Internal MEDICINE  Office Visit Note  Patient Name: Tony Lawrence  [id number]  [id number]  Date of Service: 09/09/2022  Chief Complaint  Patient presents with   Follow-up    HPI Tony Lawrence presents for a follow-up visit for OSA on CPAP OSA on CPAP Waking up feeling pretty rested but is having nerve pain in his back that makes him restless at night.  Wear CPAP for at least 7-8 hours per night.  Just wants to try out a full face mask, will need a medium or large size. Due for supplies as well. Has a history of oral cancer. Takes omeprazole for GERD.   CPAP Download 95 percentile pressure 7.9 95th percentile leak 21.6 apnea index 0.6 /hr apnea-hypopnea index  6.0 /hr total days used  >4 hr 90 days total days used <4 hr 0 days Total compliance 100 percent   He is doing good but feels he is opening mouth when lays on his back would like to try a full face mask.  Pt was seen by Tresa Endo  RRT/RCP  from [facility]  EPWORTH SLEEPINESS SCALE: Scale: (0)= no chance of dozing; (1)= slight chance of dozing; (2)= moderate chance of dozing; (3)= high chance of dozing Chance  Situtation Sitting and reading: 0 Watching TV: 1 Sitting Inactive in public: 0 As a passenger in car: 0   Lying down to rest: 1 Sitting and talking: 0 Sitting quielty after lunch: 0 In a car, stopped in traffic: 0 TOTAL SCORE:   2 out of 24   Current Medication: Outpatient Encounter Medications as of 09/09/2022  Medication Sig   Acetaminophen (TYLENOL PO) Take 1,300 mg by mouth every other day.   atorvastatin (LIPITOR) 20 MG tablet Take 1 tablet (20 mg total) by mouth daily at 6 PM. At night   bisoprolol-hydrochlorothiazide (ZIAC) 2.5-6.25 MG tablet Take 1 tablet by mouth daily.   clopidogrel (PLAVIX) 75 MG tablet TAKE 1 TABLET BY MOUTH DAILY   co-enzyme Q-10 30 MG capsule Take 30 mg by mouth 3 (three) times daily.   gabapentin (NEURONTIN) 100 MG capsule  Take 100 mg by mouth 3 (three) times daily.   glimepiride (AMARYL) 1 MG tablet Take 1 tablet (1 mg total) by mouth daily with breakfast.   glucose blood test strip E 11.9 qd Once Daily one touch   icosapent Ethyl (VASCEPA) 1 g capsule Take 2 capsules (2 g total) by mouth 2 (two) times daily.   isosorbide mononitrate (IMDUR) 30 MG 24 hr tablet TAKE ONE TABLET BY MOUTH EVERY MORNING AND TAKE TWO TABLETS BY MOUTH EVERY EVENING   Lancets (ONETOUCH ULTRASOFT) lancets Qd E 11.9 Once daily   levothyroxine (SYNTHROID) 200 MCG tablet Take 1 tablet (200 mcg total) by mouth daily. In am on empty stomach   liothyronine (CYTOMEL) 5 MCG tablet Take 0.5 tablets (2.5 mcg total) by mouth every other day.   losartan (COZAAR) 50 MG tablet Take 1 tablet (50 mg total) by mouth in the morning and at bedtime. Stop ramipril 20 mg qd, d/c losartan 25 mg qd   Multiple Vitamins-Minerals (CENTRUM SILVER 50+MEN PO) Take 1 tablet by mouth every morning.    OneTouch Delica Lancets 33G MISC Check blood sugars twice weekly   oxyCODONE (OXY IR/ROXICODONE) 5 MG immediate release tablet Take by mouth.   pantoprazole (PROTONIX) 40 MG tablet Take 1 tablet (40 mg total) by mouth daily.   traMADol (ULTRAM) 50 MG tablet  Take by mouth every 6 (six) hours as needed.   No facility-administered encounter medications on file as of 09/09/2022.    Surgical History: Past Surgical History:  Procedure Laterality Date   BIOPSY TONGUE     11/11/15 SCC tongue    cancer of tongue  08/2020   removal of cancer   CARDIAC CATHETERIZATION N/A 11/10/2015   Procedure: Left Heart Cath and Coronary Angiography;  Surgeon: Iran Ouch, MD;  Location: [facility] INVASIVE CV LAB;  Service: Cardiovascular;  Laterality: N/A;   COLONOSCOPY  05/2006   COLONOSCOPY WITH PROPOFOL N/A 05/24/2017   Procedure: COLONOSCOPY WITH PROPOFOL;  Surgeon: Christena Deem, MD;  Location: [facility] ENDOSCOPY;  Service: Endoscopy;  Laterality: N/A;   EXCISION OF TONGUE LESION N/A  11/11/2015   Procedure: EXCISION OF TONGUE LESION/ WITH FROZEN SECTION;  Surgeon: Linus Salmons, MD;  Location: [facility] ORS;  Service: ENT;  Laterality: N/A;   REPLACEMENT TOTAL KNEE Left    REPLACEMENT TOTAL KNEE Right 04/2020   [facility]   right tka minimally invasive 05/06/20 Dr. Laren Everts      Medical History: Past Medical History:  Diagnosis Date   Actinic keratoses    Actinic keratoses    Allergy    Aneurysm of ascending aorta without rupture (HCC) 03/27/2022   Annual physical exam 02/27/2021   Aortic valve regurgitation 03/27/2022   Arthritis    low back pain s/p shots and blocks, knee pain   Arthritis of right knee 11/15/2019   BCC (basal cell carcinoma of skin)    forehead and chest Dr. 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) 11/04/2015   Chronic diarrhea 12/29/2021   Complication of anesthesia    SEVERE SORE THROAT AFTER BIOPSY 2010   Contusion of toenail 02/27/2021   Coronary artery disease    Chronic total occlusion of mid LAD   COVID-19    11/17/20   Diabetes mellitus (HCC) 03/12/2013   Diabetes mellitus without complication (HCC)    Diarrhea 03/21/2018   Erectile dysfunction due to arterial insufficiency 04/24/2019   Essential hypertension 12/22/2017   Flank pain 11/30/2013   GERD (gastroesophageal reflux disease)    H/O total knee replacement, 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   History of chicken pox    History of shingles    History of skin cancer 03/21/2018   History of tongue cancer 03/21/2018   HLD (hyperlipidemia) 03/28/2022   Hyperlipidemia    Hypertension    Hypothyroidism    Increased frequency of urination 03/12/2013   Malignant neoplasm of tongue, tip and lateral border (HCC) 11/04/2015   Mitral valve insufficiency 12/22/2019   Neuritis or radiculitis due to rupture of  lumbar intervertebral disc 01/16/2014   Onychomycosis 02/27/2021   OSA on CPAP    Other intervertebral disc displacement, lumbar region 01/16/2014   Parapelvic renal cyst 12/18/2013   Primary osteoarthritis of both knees 05/13/2014   Primary osteoarthritis of left knee 11/22/2018   SCC (squamous cell carcinoma)    invasive left Post. lateral Tongue [facility] Dr. Roma Schanz excised 08/25/20 surgical exc [facility] ?recurrent vs new primary    Slowing of urinary stream 03/12/2013   Squamous cell carcinoma in situ    nose 2021    Squamous cell carcinoma in situ    nose 2021    Stroke (HCC)  Thrombocytopenia (HCC) 01/27/2022   Tongue cancer (HCC)    Squamous cell CA of tongue 11/11/15 no chemo or radiation ENT Dr. Jenne Campus, [facility] H/o    Tongue cancer [facility])    Squamous cell CA of tongue 11/11/15 no chemo or radiation ENT Dr. Jenne Campus, [facility] H/o    Urinary hesitancy 03/12/2013   Valvular heart disease 03/23/2021    Family History: Family History  Problem Relation Age of Onset   Myelodysplastic syndrome Mother    Arthritis Mother    Emphysema Father    Alcohol abuse Father    COPD Father    Depression Father    Early death Maternal Grandfather    Heart disease Maternal Grandfather    Stroke Maternal Grandfather     Social History   Socioeconomic History   Marital status: Married    Spouse name: Not on file   Number of children: Not on file   Years of education: Not on file   Highest education level: Not on file  Occupational History   Not on file  Tobacco Use   Smoking status: Never   Smokeless tobacco: Never  Vaping Use   Vaping Use: Never used  Substance and Sexual Activity   Alcohol use: Never    Alcohol/week: 0.0 standard drinks of alcohol   Drug use: No   Sexual activity: Yes  Other Topics Concern   Not on file  Social History Narrative   Married    3 sons    Engineer, maintenance (IT), former businessman in Designer, fashion/clothing retired    Owns guns, wears seat belt, safe in relationship   Social  Determinants of Health   Financial Resource Strain: Low Risk  (04/28/2022)   Overall Financial Resource Strain (CARDIA)    Difficulty of Paying Living Expenses: Not hard at all  Food Insecurity: No Food Insecurity (04/28/2022)   Hunger Vital Sign    Worried About Running Out of Food in the Last Year: Never true    Ran Out of Food in the Last Year: Never true  Transportation Needs: No Transportation Needs (04/28/2022)   PRAPARE - Administrator, Civil Service (Medical): No    Lack of Transportation (Non-Medical): No  Physical Activity: Insufficiently Active (04/28/2022)   Exercise Vital Sign    Days of Exercise per Week: 3 days    Minutes of Exercise per Session: 30 min  Stress: No Stress Concern Present (04/28/2022)   [employer] of Occupational Health - Occupational Stress Questionnaire    Feeling of Stress : Not at all  Social Connections: Moderately Integrated (04/28/2022)   Social Connection and Isolation Panel [NHANES]    Frequency of Communication with Friends and Family: More than three times a week    Frequency of Social Gatherings with Friends and Family: More than three times a week    Attends Religious Services: More than 4 times per year    Active Member of [employer] or Organizations: No    Attends Banker Meetings: Never    Marital Status: Married  Catering manager Violence: Not At Risk (04/28/2022)   Humiliation, Afraid, Rape, and Kick questionnaire    Fear of Current or Ex-Partner: No    Emotionally Abused: No    Physically Abused: No    Sexually Abused: No      Review of Systems  Constitutional:  Negative for chills, fatigue and unexpected weight change.  HENT:  Negative for congestion, postnasal drip, rhinorrhea, sneezing and sore throat.   Respiratory: Negative.  Negative for cough, chest tightness and shortness of breath.   Cardiovascular: Negative.  Negative for chest pain and palpitations.  Gastrointestinal:  Negative for abdominal pain,  constipation, diarrhea, nausea and vomiting.  Musculoskeletal:  Negative for arthralgias, back pain, joint swelling and neck pain.  Skin:  Negative for rash.  Neurological: Negative.   Psychiatric/Behavioral:  Negative for behavioral problems (Depression), sleep disturbance and suicidal ideas. The patient is not nervous/anxious.     Vital Signs: BP (!) 155/88 Comment: 180/76  Pulse 64   Temp 98.1 F (36.7 C)   Resp 16   Ht 5\' 10"  (1.778 m)   Wt 247 lb 3.2 oz (112.1 kg)   SpO2 97%   BMI 35.47 kg/m    Physical Exam Vitals reviewed.  Constitutional:      General: He is not in acute distress.    Appearance: Normal appearance. He is obese. He is not ill-appearing.  HENT:     Head: Normocephalic and atraumatic.  Eyes:     Pupils: Pupils are equal, round, and reactive to light.  Cardiovascular:     Rate and Rhythm: Normal rate and regular rhythm.  Pulmonary:     Effort: Pulmonary effort is normal. No respiratory distress.  Neurological:     Mental Status: He is alert and oriented to person, place, and time.  Psychiatric:        Mood and Affect: Mood normal.        Behavior: Behavior normal.        Assessment/Plan: 1. OSA on CPAP New face mask ordered  - For home use only DME continuous positive airway pressure (CPAP)  2. CPAP use counseling New face mask ordered - For home use only DME continuous positive airway pressure (CPAP)  3. Gastroesophageal reflux disease without esophagitis Controlled, continue omeprazole as prescribed.   4. History of oral cancer Noted, and he is a mouth breather which is why he wants a full face mask   General Counseling: Delroy verbalizes understanding of the findings of todays visit and agrees with plan of treatment. I have discussed any further diagnostic evaluation that may be needed or ordered today. We also reviewed his medications today. he has been encouraged to call the office with any questions or concerns that should arise  related to todays visit.    Orders Placed This Encounter  Procedures   For home use only DME continuous positive airway pressure (CPAP)    No orders of the defined types were placed in this encounter.   Return in about 6 months (around 03/12/2023) for F/U, pulmonary/sleep with DSK or Timisha Mondry, please make sure the appt is after his appt with Tresa Endo RT.   Total time spent:30 Minutes Time spent includes review of chart, medications, test results, and follow up plan with the patient.   [place] Controlled Substance Database was reviewed by me.  This patient was seen by Sallyanne Kuster, FNP-C in collaboration with Dr. Beverely Risen as a part of collaborative care agreement.   Lyfe Monger R. Tedd Sias, MSN, FNP-C Internal medicine

## 2022-09-17 ENCOUNTER — Telehealth: Payer: Self-pay | Admitting: Internal Medicine

## 2022-09-17 ENCOUNTER — Other Ambulatory Visit: Payer: Medicare Other

## 2022-09-17 NOTE — Telephone Encounter (Signed)
Spoke to patient and informed him of providers recommendations from lab results as follows:  "Lipids are adequately controlled.  Continue current medications and follow-up as scheduled"  Patient understood with read back

## 2022-09-17 NOTE — Telephone Encounter (Signed)
Patient is returning call in regards to results. Requesting return call.  

## 2022-09-21 ENCOUNTER — Other Ambulatory Visit: Payer: Self-pay | Admitting: Internal Medicine

## 2022-09-23 ENCOUNTER — Ambulatory Visit: Payer: Medicare Other | Attending: Internal Medicine

## 2022-09-23 DIAGNOSIS — I25118 Atherosclerotic heart disease of native coronary artery with other forms of angina pectoris: Secondary | ICD-10-CM | POA: Diagnosis not present

## 2022-09-23 DIAGNOSIS — I351 Nonrheumatic aortic (valve) insufficiency: Secondary | ICD-10-CM

## 2022-09-23 LAB — ECHOCARDIOGRAM COMPLETE
AR max vel: 2.67 cm2
AV Area VTI: 2.87 cm2
AV Area mean vel: 2.73 cm2
AV Mean grad: 9 mmHg
AV Peak grad: 16.8 mmHg
Ao pk vel: 2.05 m/s
Area-P 1/2: 1.94 cm2
MV VTI: 2.72 cm2
P 1/2 time: 616 msec
S' Lateral: 2.4 cm

## 2022-09-28 DIAGNOSIS — M546 Pain in thoracic spine: Secondary | ICD-10-CM | POA: Diagnosis not present

## 2022-09-29 ENCOUNTER — Ambulatory Visit: Payer: Medicare Other | Attending: Internal Medicine | Admitting: Internal Medicine

## 2022-09-29 ENCOUNTER — Encounter: Payer: Self-pay | Admitting: Internal Medicine

## 2022-09-29 VITALS — BP 158/64 | HR 50 | Ht 70.0 in | Wt 247.0 lb

## 2022-09-29 DIAGNOSIS — E1169 Type 2 diabetes mellitus with other specified complication: Secondary | ICD-10-CM | POA: Insufficient documentation

## 2022-09-29 DIAGNOSIS — I77810 Thoracic aortic ectasia: Secondary | ICD-10-CM | POA: Insufficient documentation

## 2022-09-29 DIAGNOSIS — I25118 Atherosclerotic heart disease of native coronary artery with other forms of angina pectoris: Secondary | ICD-10-CM | POA: Diagnosis not present

## 2022-09-29 DIAGNOSIS — Z7984 Long term (current) use of oral hypoglycemic drugs: Secondary | ICD-10-CM | POA: Diagnosis not present

## 2022-09-29 DIAGNOSIS — I351 Nonrheumatic aortic (valve) insufficiency: Secondary | ICD-10-CM | POA: Diagnosis not present

## 2022-09-29 DIAGNOSIS — E785 Hyperlipidemia, unspecified: Secondary | ICD-10-CM | POA: Insufficient documentation

## 2022-09-29 DIAGNOSIS — Z79899 Other long term (current) drug therapy: Secondary | ICD-10-CM

## 2022-09-29 DIAGNOSIS — I10 Essential (primary) hypertension: Secondary | ICD-10-CM | POA: Insufficient documentation

## 2022-09-29 MED ORDER — HYDROCHLOROTHIAZIDE 25 MG PO TABS: 25.0000 mg | ORAL_TABLET | Freq: Every day | ORAL | 3 refills | Status: DC

## 2022-09-29 NOTE — Patient Instructions (Signed)
Medication Instructions:  Your physician recommends the following medication changes.  STOP TAKING: Bisoprolol-HTCZ   START TAKING: Hydrochlorothiazide 25 mg twice a day    *If you need a refill on your cardiac medications before your next appointment, please call your pharmacy*   Lab Work: Your provider would like for you to return in 1 month to have the following labs drawn: BMP.   Please go to the [facility] entrance and check in at the front desk.  You do not need an appointment.  They are open from 7am-6 pm.  You don't need to be fasting.  If you have labs (blood work) drawn today and your tests are completely normal, you will receive your results only by: MyChart Message (if you have MyChart) OR A paper copy in the mail If you have any lab test that is abnormal or we need to change your treatment, we will call you to review the results.   Testing/Procedures: No test   Follow-Up: At [facility], you and your health needs are our priority.  As part of our continuing mission to provide you with exceptional heart care, we have created designated Provider Care Teams.  These Care Teams include your primary Cardiologist (physician) and Advanced Practice Providers (APPs -  Physician Assistants and Nurse Practitioners) who all work together to provide you with the care you need, when you need it.  We recommend signing up for the patient portal called "MyChart".  Sign up information is provided on this After Visit Summary.  MyChart is used to connect with patients for Virtual Visits (Telemedicine).  Patients are able to view lab/test results, encounter notes, upcoming appointments, etc.  Non-urgent messages can be sent to your provider as well.   To learn more about what you can do with MyChart, go to [email or web address].    Your next appointment:   2 month(s)  Provider:   You may see Yvonne Kendall, MD or one of the following Advanced Practice Providers on  your designated Care Team:   Nicolasa Ducking, NP Eula Listen, PA-C Cadence Fransico Michael, PA-C Charlsie Quest, NP

## 2022-09-29 NOTE — Progress Notes (Signed)
Follow-up Outpatient Visit Date: 09/29/2022  Primary Care Provider: Dana Allan, MD [address] [place] [address]  Chief Complaint: Follow-up coronary artery disease  HPI:  Tony Lawrence is a 77 y.o. male with history of coronary artery disease with chronic total occlusion of the mid LAD, mild aortic regurgitation, mild mitral valve stenosis, hypertension, hyperlipidemia, diabetes mellitus, obstructive sleep apnea, and hypothyroidism, who presents for follow-up of coronary artery disease.  I last saw him in November, at which time he was feeling well without chest pain or shortness of breath.  Due to suboptimal blood pressure control, we agreed to increase losartan to 50 mg twice daily.  Today, Tony Lawrence reports that he is feeling fairly well other than chronic back pain with nerve pain radiating around the right side of his chest towards the sternum.  He does not have any exertional/anginal chest pain nor shortness of breath, palpitations, lightheadedness, or edema.  He also complains of neuropathy in his feet for which she is taking gabapentin and quite a bit of acetaminophen (up to 3,900 mg/day).  Home blood pressures are typically 140-160/70-80.  He is taking losartan 50 mg twice daily as well as his other antihypertensives but has not noticed much improvement over the last few months.  --------------------------------------------------------------------------------------------------  Past Medical History:  Diagnosis Date   Actinic keratoses    Actinic keratoses    Allergy    Aneurysm of ascending aorta without rupture (HCC) 03/27/2022   Annual physical exam 02/27/2021   Aortic valve regurgitation 03/27/2022   Arthritis    low back pain s/p shots and blocks, knee pain   Arthritis of right knee 11/15/2019   BCC (basal cell carcinoma of skin)    forehead and chest Dr. 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) 11/04/2015   Chronic diarrhea 12/29/2021   Complication of anesthesia    SEVERE SORE THROAT AFTER BIOPSY 2010   Contusion of toenail 02/27/2021   Coronary artery disease    Chronic total occlusion of mid LAD   COVID-19    11/17/20   Diabetes mellitus (HCC) 03/12/2013   Diabetes mellitus without complication (HCC)    Diarrhea 03/21/2018   Erectile dysfunction due to arterial insufficiency 04/24/2019   Essential hypertension 12/22/2017   Flank pain 11/30/2013   GERD (gastroesophageal reflux disease)    H/O total knee replacement, 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   History of chicken pox    History of shingles    History of skin cancer 03/21/2018   History of tongue cancer 03/21/2018   HLD (hyperlipidemia) 03/28/2022   Hyperlipidemia    Hypertension    Hypothyroidism    Increased frequency of urination 03/12/2013   Malignant neoplasm of tongue, tip and lateral border (HCC) 11/04/2015   Mitral valve insufficiency 12/22/2019   Neuritis or radiculitis due to rupture of lumbar intervertebral disc 01/16/2014   Onychomycosis 02/27/2021   OSA on CPAP    Other intervertebral disc displacement, lumbar region 01/16/2014   Parapelvic renal cyst 12/18/2013   Primary osteoarthritis of both knees 05/13/2014   Primary osteoarthritis of left knee 11/22/2018   SCC (squamous cell carcinoma)    invasive left Post. lateral Tongue [facility] Dr. Roma Schanz excised 08/25/20 surgical exc [facility] ?recurrent vs new primary    Slowing of urinary stream 03/12/2013   Squamous cell carcinoma in situ  nose 2021    Squamous cell carcinoma in situ    nose 2021    Stroke (HCC)    Thrombocytopenia (HCC) 01/27/2022   Tongue cancer (HCC)    Squamous cell CA of tongue 11/11/15 no chemo or radiation ENT Dr. Jenne Campus, [facility] H/o    Tongue cancer [facility])    Squamous cell CA of tongue 11/11/15 no chemo or radiation ENT Dr. Jenne Campus, [facility] H/o     Urinary hesitancy 03/12/2013   Valvular heart disease 03/23/2021   Past Surgical History:  Procedure Laterality Date   BIOPSY TONGUE     11/11/15 SCC tongue    cancer of tongue  08/2020   removal of cancer   CARDIAC CATHETERIZATION N/A 11/10/2015   Procedure: Left Heart Cath and Coronary Angiography;  Surgeon: Iran Ouch, MD;  Location: [facility] INVASIVE CV LAB;  Service: Cardiovascular;  Laterality: N/A;   COLONOSCOPY  05/2006   COLONOSCOPY WITH PROPOFOL N/A 05/24/2017   Procedure: COLONOSCOPY WITH PROPOFOL;  Surgeon: Christena Deem, MD;  Location: [facility] ENDOSCOPY;  Service: Endoscopy;  Laterality: N/A;   EXCISION OF TONGUE LESION N/A 11/11/2015   Procedure: EXCISION OF TONGUE LESION/ WITH FROZEN SECTION;  Surgeon: Linus Salmons, MD;  Location: [facility] ORS;  Service: ENT;  Laterality: N/A;   REPLACEMENT TOTAL KNEE Left    REPLACEMENT TOTAL KNEE Right 04/2020   [facility]   right tka minimally invasive 05/06/20 Dr. Laren Everts      Current Meds  Medication Sig   Acetaminophen (TYLENOL PO) Take 1,300 mg by mouth every other day.   atorvastatin (LIPITOR) 20 MG tablet Take 1 tablet (20 mg total) by mouth daily at 6 PM. At night   bisoprolol-hydrochlorothiazide (ZIAC) 2.5-6.25 MG tablet Take 1 tablet by mouth daily.   clopidogrel (PLAVIX) 75 MG tablet TAKE 1 TABLET BY MOUTH DAILY   co-enzyme Q-10 30 MG capsule Take 30 mg by mouth 3 (three) times daily.   Cyanocobalamin 1000 MCG/ML LIQD Take by mouth daily.   gabapentin (NEURONTIN) 100 MG capsule Take 100 mg by mouth 3 (three) times daily.   glimepiride (AMARYL) 1 MG tablet Take 1 tablet (1 mg total) by mouth daily with breakfast.   glucose blood test strip E 11.9 qd Once Daily one touch   icosapent Ethyl (VASCEPA) 1 g capsule Take 2 capsules (2 g total) by mouth 2 (two) times daily.   isosorbide mononitrate (IMDUR) 30 MG 24 hr tablet TAKE 1 TABLET BY MOUTH EVERY MORNING AND TAKE TWO TABLETS BY MOUTH EVERY  EVENING   levothyroxine (SYNTHROID) 200 MCG tablet Take 1 tablet (200 mcg total) by mouth daily. In am on empty stomach   liothyronine (CYTOMEL) 5 MCG tablet Take 0.5 tablets (2.5 mcg total) by mouth every other day.   losartan (COZAAR) 50 MG tablet Take 1 tablet (50 mg total) by mouth in the morning and at bedtime. Stop ramipril 20 mg qd, d/c losartan 25 mg qd   Multiple Vitamins-Minerals (CENTRUM SILVER 50+MEN PO) Take 1 tablet by mouth every morning.    OneTouch Delica Lancets 33G MISC Check blood sugars twice weekly   pantoprazole (PROTONIX) 40 MG tablet Take 1 tablet (40 mg total) by mouth daily.    Allergies: Penicillins  Social History   Tobacco Use   Smoking status: Never   Smokeless tobacco: Never  Vaping Use   Vaping Use: Never used  Substance Use Topics   Alcohol use: Never    Alcohol/week: 0.0 standard drinks of  alcohol   Drug use: No    Family History  Problem Relation Age of Onset   Myelodysplastic syndrome Mother    Arthritis Mother    Emphysema Father    Alcohol abuse Father    COPD Father    Depression Father    Early death Maternal Grandfather    Heart disease Maternal Grandfather    Stroke Maternal Grandfather     Review of Systems: A 12-system review of systems was performed and was negative except as noted in the HPI.  --------------------------------------------------------------------------------------------------  Physical Exam: BP (!) 158/64   Pulse (!) 50   Ht 5\' 10"  (1.778 m)   Wt 247 lb (112 kg)   SpO2 97%   BMI 35.44 kg/m  Repeat BP: 158/64  General:  NAD. Neck: No JVD or HJR. Lungs: Clear to auscultation bilaterally without wheezes or crackles. Heart: Bradycardic but regular without murmurs. Abdomen: Soft, nontender, nondistended. Extremities: No lower extremity edema.  EKG: Sinus bradycardia with left axis deviation and septal infarct.  Heart rate is slightly lower compared to 03/28/2022.  Otherwise, no significant interval  change.  Lab Results  Component Value Date   WBC 7.5 08/09/2022   HGB 14.9 08/09/2022   HCT 44.0 08/09/2022   MCV 91.7 08/09/2022   PLT 173 08/09/2022    Lab Results  Component Value Date   NA 136 05/03/2022   K 3.8 05/03/2022   CL 100 05/03/2022   CO2 29 05/03/2022   BUN 14 05/03/2022   CREATININE 1.00 05/03/2022   GLUCOSE 168 (H) 05/03/2022   ALT 28 09/09/2022    Lab Results  Component Value Date   CHOL 92 09/09/2022   HDL 26 (L) 09/09/2022   LDLCALC 36 09/09/2022   LDLDIRECT 31.0 01/25/2022   TRIG 150 (H) 09/09/2022   CHOLHDL 3.5 09/09/2022    --------------------------------------------------------------------------------------------------  ASSESSMENT AND PLAN: Coronary artery disease with stable angina: No chest pain reported.  We will continue aggressive secondary prevention with clopidogrel as well as atorvastatin and Vascepa.  Lipids well-controlled on last check other than borderline elevated triglycerides.  Given baseline bradycardia, we have agreed to discontinue bisoprolol.  Hypertension: Blood pressure remains suboptimally controlled today.  We will discontinue bisoprolol-HCTZ due to baseline bradycardia and start HCTZ 25 mg daily.  Continue losartan 50 mg twice daily.  If blood pressure remains suboptimally controlled, we will need to consider adding amlodipine or spironolactone at her next visit.  We will plan to check a BMP in about a month.  Hyperlipidemia associated with type 2 diabetes mellitus: Lipids reasonably well-controlled with excellent LDL and borderline triglycerides.  Continue current doses of atorvastatin and Vascepa.  Ongoing management of diabetes mellitus per Dr. Clent Ridges.  Aortic regurgitation and dilated aortic root: Recent echo shows normal LVEF with mild to moderate aortic regurgitation.  Mild dilation of the aortic root is stable at 4 cm since 01/2020.  Continue to work on blood pressure control.  Follow-up: Return to clinic in 2  months.  Yvonne Kendall, MD 09/29/2022 11:43 AM

## 2022-09-30 ENCOUNTER — Encounter: Payer: Self-pay | Admitting: Internal Medicine

## 2022-09-30 DIAGNOSIS — I77810 Thoracic aortic ectasia: Secondary | ICD-10-CM | POA: Insufficient documentation

## 2022-10-05 DIAGNOSIS — M5136 Other intervertebral disc degeneration, lumbar region: Secondary | ICD-10-CM | POA: Diagnosis not present

## 2022-10-05 DIAGNOSIS — M5414 Radiculopathy, thoracic region: Secondary | ICD-10-CM | POA: Diagnosis not present

## 2022-10-05 DIAGNOSIS — M5416 Radiculopathy, lumbar region: Secondary | ICD-10-CM | POA: Diagnosis not present

## 2022-10-05 DIAGNOSIS — M545 Low back pain, unspecified: Secondary | ICD-10-CM | POA: Diagnosis not present

## 2022-10-06 DIAGNOSIS — M48062 Spinal stenosis, lumbar region with neurogenic claudication: Secondary | ICD-10-CM | POA: Diagnosis not present

## 2022-10-06 DIAGNOSIS — M5416 Radiculopathy, lumbar region: Secondary | ICD-10-CM | POA: Diagnosis not present

## 2022-10-12 ENCOUNTER — Other Ambulatory Visit: Payer: Self-pay | Admitting: Family Medicine

## 2022-10-12 DIAGNOSIS — M5414 Radiculopathy, thoracic region: Secondary | ICD-10-CM

## 2022-10-15 ENCOUNTER — Other Ambulatory Visit: Payer: Self-pay | Admitting: Family Medicine

## 2022-10-15 DIAGNOSIS — M5416 Radiculopathy, lumbar region: Secondary | ICD-10-CM

## 2022-10-22 ENCOUNTER — Encounter: Payer: Self-pay | Admitting: Family Medicine

## 2022-10-22 DIAGNOSIS — I2510 Atherosclerotic heart disease of native coronary artery without angina pectoris: Secondary | ICD-10-CM

## 2022-10-22 DIAGNOSIS — E119 Type 2 diabetes mellitus without complications: Secondary | ICD-10-CM

## 2022-10-22 MED ORDER — ATORVASTATIN CALCIUM 20 MG PO TABS
20.0000 mg | ORAL_TABLET | Freq: Every day | ORAL | 3 refills | Status: DC
Start: 2022-10-22 — End: 2023-10-17

## 2022-10-22 MED ORDER — GLIMEPIRIDE 1 MG PO TABS
1.0000 mg | ORAL_TABLET | Freq: Every day | ORAL | 3 refills | Status: DC
Start: 2022-10-22 — End: 2023-11-04

## 2022-10-22 NOTE — Telephone Encounter (Signed)
Prescription Request  10/22/2022  LOV: 07/14/2022  What is the name of the medication or equipment? atorvastatin (LIPITOR) 20 MG tablet and glimepiride (AMARYL) 1 MG tablet  Have you contacted your pharmacy to request a refill? Yes   Which pharmacy would you like this sent to?   [facility] [id number] [place], [place] - [address] [place] [address] Phone: [phone] Fax: [phone]    Patient notified that their request is being sent to the clinical staff for review and that they should receive a response within 2 business days.   Please advise at Mobile [phone] (mobile)

## 2022-10-23 ENCOUNTER — Ambulatory Visit
Admission: RE | Admit: 2022-10-23 | Discharge: 2022-10-23 | Disposition: A | Discharge status: Home / Self Care | Payer: Medicare Other | Source: Ambulatory Visit | Attending: Family Medicine | Admitting: Family Medicine

## 2022-10-23 DIAGNOSIS — M2578 Osteophyte, vertebrae: Secondary | ICD-10-CM | POA: Diagnosis not present

## 2022-10-23 DIAGNOSIS — M5116 Intervertebral disc disorders with radiculopathy, lumbar region: Secondary | ICD-10-CM | POA: Diagnosis not present

## 2022-10-23 DIAGNOSIS — M4724 Other spondylosis with radiculopathy, thoracic region: Secondary | ICD-10-CM | POA: Diagnosis not present

## 2022-10-23 DIAGNOSIS — M5414 Radiculopathy, thoracic region: Secondary | ICD-10-CM

## 2022-10-23 DIAGNOSIS — M5416 Radiculopathy, lumbar region: Secondary | ICD-10-CM

## 2022-10-31 ENCOUNTER — Other Ambulatory Visit: Payer: Medicare Other

## 2022-11-08 DIAGNOSIS — M5416 Radiculopathy, lumbar region: Secondary | ICD-10-CM | POA: Diagnosis not present

## 2022-11-08 DIAGNOSIS — M5414 Radiculopathy, thoracic region: Secondary | ICD-10-CM | POA: Diagnosis not present

## 2022-11-08 DIAGNOSIS — M5134 Other intervertebral disc degeneration, thoracic region: Secondary | ICD-10-CM | POA: Diagnosis not present

## 2022-11-08 DIAGNOSIS — M5136 Other intervertebral disc degeneration, lumbar region: Secondary | ICD-10-CM | POA: Diagnosis not present

## 2022-11-17 DIAGNOSIS — C029 Malignant neoplasm of tongue, unspecified: Secondary | ICD-10-CM | POA: Diagnosis not present

## 2022-11-22 ENCOUNTER — Other Ambulatory Visit
Admission: RE | Admit: 2022-11-22 | Discharge: 2022-11-22 | Disposition: A | Discharge status: Home / Self Care | Payer: Medicare Other | Attending: Internal Medicine | Admitting: Internal Medicine

## 2022-11-22 DIAGNOSIS — Z79899 Other long term (current) drug therapy: Secondary | ICD-10-CM | POA: Insufficient documentation

## 2022-11-22 DIAGNOSIS — E1159 Type 2 diabetes mellitus with other circulatory complications: Secondary | ICD-10-CM | POA: Diagnosis not present

## 2022-11-22 DIAGNOSIS — E1165 Type 2 diabetes mellitus with hyperglycemia: Secondary | ICD-10-CM | POA: Diagnosis not present

## 2022-11-22 DIAGNOSIS — I25118 Atherosclerotic heart disease of native coronary artery with other forms of angina pectoris: Secondary | ICD-10-CM | POA: Diagnosis not present

## 2022-11-22 LAB — BASIC METABOLIC PANEL
Anion gap: 11 (ref 5–15)
BUN: 19 mg/dL (ref 8–23)
CO2: 26 mmol/L (ref 22–32)
Calcium: 9.1 mg/dL (ref 8.9–10.3)
Chloride: 97 mmol/L — ABNORMAL LOW (ref 98–111)
Creatinine, Ser: 1.25 mg/dL — ABNORMAL HIGH (ref 0.61–1.24)
GFR, Estimated: 59 mL/min — ABNORMAL LOW (ref >60)
Glucose, Bld: 239 mg/dL — ABNORMAL HIGH (ref 70–99)
Potassium: 4.7 mmol/L (ref 3.5–5.1)
Sodium: 134 mmol/L — ABNORMAL LOW (ref 135–145)

## 2022-11-23 ENCOUNTER — Other Ambulatory Visit: Payer: Self-pay

## 2022-11-23 DIAGNOSIS — M47816 Spondylosis without myelopathy or radiculopathy, lumbar region: Secondary | ICD-10-CM | POA: Diagnosis not present

## 2022-11-23 DIAGNOSIS — M5136 Other intervertebral disc degeneration, lumbar region: Secondary | ICD-10-CM | POA: Diagnosis not present

## 2022-11-23 MED ORDER — HYDROCHLOROTHIAZIDE 12.5 MG PO TABS: 12.5000 mg | ORAL_TABLET | Freq: Every day | ORAL | 3 refills | Status: DC

## 2022-11-29 ENCOUNTER — Other Ambulatory Visit: Payer: Self-pay

## 2022-11-29 DIAGNOSIS — E039 Hypothyroidism, unspecified: Secondary | ICD-10-CM

## 2022-11-29 MED ORDER — LIOTHYRONINE SODIUM 5 MCG PO TABS
2.5000 ug | ORAL_TABLET | ORAL | 3 refills | Status: DC
Start: 2022-11-29 — End: 2023-11-04

## 2022-11-30 ENCOUNTER — Other Ambulatory Visit: Payer: Self-pay | Admitting: Internal Medicine

## 2022-12-08 ENCOUNTER — Ambulatory Visit: Payer: Medicare Other | Admitting: Nurse Practitioner

## 2022-12-09 ENCOUNTER — Telehealth: Payer: Self-pay | Admitting: Family Medicine

## 2022-12-09 NOTE — Telephone Encounter (Signed)
Pt scheduled with Evelene Croon on 12/10/2022

## 2022-12-09 NOTE — Telephone Encounter (Signed)
Pt called stating he need a referral for  pain management doctor because he is having nerve and leg pain since may

## 2022-12-10 ENCOUNTER — Ambulatory Visit (INDEPENDENT_AMBULATORY_CARE_PROVIDER_SITE_OTHER): Payer: Medicare Other | Admitting: Nurse Practitioner

## 2022-12-10 ENCOUNTER — Encounter: Payer: Self-pay | Admitting: Nurse Practitioner

## 2022-12-10 VITALS — BP 124/64 | HR 93 | Temp 97.7°F | Resp 16 | Ht 70.0 in | Wt 228.0 lb

## 2022-12-10 DIAGNOSIS — M549 Dorsalgia, unspecified: Secondary | ICD-10-CM | POA: Diagnosis not present

## 2022-12-10 DIAGNOSIS — M5430 Sciatica, unspecified side: Secondary | ICD-10-CM

## 2022-12-10 NOTE — Progress Notes (Unsigned)
Established Patient Office Visit  Subjective:  Patient ID: Tony Lawrence, male    DOB: 09-19-1945  Age: 77 y.o. MRN: [id number]  CC:  Chief Complaint  Patient presents with   Referral    To pain clinic for back pain from sciatic nerve. Dr. Gwenyth Bender recommend pain clinic. Pt has had pain since middle of May.    HPI  Tony Lawrence presents for back pain that radiates to right foot. Has been     HPI   Past Medical History:  Diagnosis Date   Actinic keratoses    Actinic keratoses    Allergy    Aneurysm of ascending aorta without rupture (HCC) 03/27/2022   Annual physical exam 02/27/2021   Aortic valve regurgitation 03/27/2022   Arthritis    low back pain s/p shots and blocks, knee pain   Arthritis of right knee 11/15/2019   BCC (basal cell carcinoma of skin)    forehead and chest Dr. 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) 11/04/2015   Chronic diarrhea 12/29/2021   Complication of anesthesia    SEVERE SORE THROAT AFTER BIOPSY 2010   Contusion of toenail 02/27/2021   Coronary artery disease    Chronic total occlusion of mid LAD   COVID-19    11/17/20   Diabetes mellitus (HCC) 03/12/2013   Diabetes mellitus without complication (HCC)    Diarrhea 03/21/2018   Erectile dysfunction due to arterial insufficiency 04/24/2019   Essential hypertension 12/22/2017   Flank pain 11/30/2013   GERD (gastroesophageal reflux disease)    H/O total knee replacement, 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   History of chicken pox    History of shingles    History of skin cancer 03/21/2018   History of tongue cancer 03/21/2018   HLD (hyperlipidemia) 03/28/2022   Hyperlipidemia    Hypertension    Hypothyroidism    Increased frequency of urination 03/12/2013   Malignant neoplasm of tongue, tip and lateral border (HCC) 11/04/2015    Mitral valve insufficiency 12/22/2019   Neuritis or radiculitis due to rupture of lumbar intervertebral disc 01/16/2014   Onychomycosis 02/27/2021   OSA on CPAP    Other intervertebral disc displacement, lumbar region 01/16/2014   Parapelvic renal cyst 12/18/2013   Primary osteoarthritis of both knees 05/13/2014   Primary osteoarthritis of left knee 11/22/2018   SCC (squamous cell carcinoma)    invasive left Post. lateral Tongue [facility] Dr. Roma Schanz excised 08/25/20 surgical exc [facility] ?recurrent vs new primary    Slowing of urinary stream 03/12/2013   Squamous cell carcinoma in situ    nose 2021    Squamous cell carcinoma in situ    nose 2021    Stroke (HCC)    Thrombocytopenia (HCC) 01/27/2022   Tongue cancer (HCC)    Squamous cell CA of tongue 11/11/15 no chemo or radiation ENT Dr. Jenne Campus, [facility] H/o    Tongue cancer [facility])    Squamous cell CA of tongue 11/11/15 no chemo or radiation ENT Dr. Jenne Campus, [facility] H/o    Urinary hesitancy 03/12/2013   Valvular heart disease 03/23/2021    Past Surgical History:  Procedure Laterality Date   BIOPSY TONGUE     11/11/15 SCC tongue    cancer of tongue  08/2020   removal of cancer   CARDIAC CATHETERIZATION N/A 11/10/2015   Procedure:  Left Heart Cath and Coronary Angiography;  Surgeon: Iran Ouch, MD;  Location: [facility] INVASIVE CV LAB;  Service: Cardiovascular;  Laterality: N/A;   COLONOSCOPY  05/2006   COLONOSCOPY WITH PROPOFOL N/A 05/24/2017   Procedure: COLONOSCOPY WITH PROPOFOL;  Surgeon: Christena Deem, MD;  Location: [facility] ENDOSCOPY;  Service: Endoscopy;  Laterality: N/A;   EXCISION OF TONGUE LESION N/A 11/11/2015   Procedure: EXCISION OF TONGUE LESION/ WITH FROZEN SECTION;  Surgeon: Linus Salmons, MD;  Location: [facility] ORS;  Service: ENT;  Laterality: N/A;   REPLACEMENT TOTAL KNEE Left    REPLACEMENT TOTAL KNEE Right 04/2020   [facility]   right tka minimally invasive 05/06/20 Dr. Laren Everts      Family History   Problem Relation Age of Onset   Myelodysplastic syndrome Mother    Arthritis Mother    Emphysema Father    Alcohol abuse Father    COPD Father    Depression Father    Early death Maternal Grandfather    Heart disease Maternal Grandfather    Stroke Maternal Grandfather     Social History   Socioeconomic History   Marital status: Married    Spouse name: Not on file   Number of children: Not on file   Years of education: Not on file   Highest education level: Not on file  Occupational History   Not on file  Tobacco Use   Smoking status: Never   Smokeless tobacco: Never  Vaping Use   Vaping status: Never Used  Substance and Sexual Activity   Alcohol use: Never    Alcohol/week: 0.0 standard drinks of alcohol   Drug use: No   Sexual activity: Yes  Other Topics Concern   Not on file  Social History Narrative   Married    3 sons    Engineer, maintenance (IT), former businessman in Designer, fashion/clothing retired    Owns guns, wears seat belt, safe in relationship   Social Determinants of Health   Financial Resource Strain: Low Risk  (04/28/2022)   Overall Financial Resource Strain (CARDIA)    Difficulty of Paying Living Expenses: Not hard at all  Food Insecurity: No Food Insecurity (04/28/2022)   Hunger Vital Sign    Worried About Running Out of Food in the Last Year: Never true    Ran Out of Food in the Last Year: Never true  Transportation Needs: No Transportation Needs (04/28/2022)   PRAPARE - Administrator, Civil Service (Medical): No    Lack of Transportation (Non-Medical): No  Physical Activity: Insufficiently Active (04/28/2022)   Exercise Vital Sign    Days of Exercise per Week: 3 days    Minutes of Exercise per Session: 30 min  Stress: No Stress Concern Present (04/28/2022)   [employer] of Occupational Health - Occupational Stress Questionnaire    Feeling of Stress : Not at all  Social Connections: Moderately Integrated (04/28/2022)   Social Connection and Isolation  Panel [NHANES]    Frequency of Communication with Friends and Family: More than three times a week    Frequency of Social Gatherings with Friends and Family: More than three times a week    Attends Religious Services: More than 4 times per year    Active Member of [employer] or Organizations: No    Attends Banker Meetings: Never    Marital Status: Married  Catering manager Violence: Not At Risk (04/28/2022)   Humiliation, Afraid, Rape, and Kick questionnaire    Fear  of Current or Ex-Partner: No    Emotionally Abused: No    Physically Abused: No    Sexually Abused: No     Outpatient Medications Prior to Visit  Medication Sig Dispense Refill   Acetaminophen (TYLENOL PO) Take 1,300 mg by mouth every other day.     atorvastatin (LIPITOR) 20 MG tablet Take 1 tablet (20 mg total) by mouth daily at 6 PM. At night 90 tablet 3   clopidogrel (PLAVIX) 75 MG tablet TAKE 1 TABLET BY MOUTH DAILY 90 tablet 0   co-enzyme Q-10 30 MG capsule Take 30 mg by mouth 3 (three) times daily.     glimepiride (AMARYL) 1 MG tablet Take 1 tablet (1 mg total) by mouth daily with breakfast. 90 tablet 3   glucose blood test strip E 11.9 qd Once Daily one touch 100 each 12   hydrochlorothiazide (HYDRODIURIL) 12.5 MG tablet Take 1 tablet (12.5 mg total) by mouth daily. 90 tablet 3   isosorbide mononitrate (IMDUR) 30 MG 24 hr tablet TAKE 1 TABLET BY MOUTH EVERY MORNING AND TAKE TWO TABLETS BY MOUTH EVERY EVENING 270 tablet 0   JANUVIA 100 MG tablet Take 100 mg by mouth daily.     levothyroxine (SYNTHROID) 200 MCG tablet Take 1 tablet (200 mcg total) by mouth daily. In am on empty stomach 90 tablet 3   liothyronine (CYTOMEL) 5 MCG tablet Take 0.5 tablets (2.5 mcg total) by mouth every other day. 45 tablet 3   losartan (COZAAR) 50 MG tablet Take 1 tablet (50 mg total) by mouth in the morning and at bedtime. Stop ramipril 20 mg qd, d/c losartan 25 mg qd 180 tablet 3   Multiple Vitamins-Minerals (CENTRUM SILVER 50+MEN  PO) Take 1 tablet by mouth every morning.      OneTouch Delica Lancets 33G MISC Check blood sugars twice weekly 100 each 1   pantoprazole (PROTONIX) 40 MG tablet Take 1 tablet (40 mg total) by mouth daily. 90 tablet 3   traMADol (ULTRAM) 50 MG tablet Take 50 mg by mouth 3 (three) times daily.     Cyanocobalamin 1000 MCG/ML LIQD Take by mouth daily. (Patient not taking: Reported on 12/10/2022)     gabapentin (NEURONTIN) 100 MG capsule Take 100 mg by mouth 3 (three) times daily. (Patient not taking: Reported on 12/10/2022)     icosapent Ethyl (VASCEPA) 1 g capsule Take 2 capsules (2 g total) by mouth 2 (two) times daily. (Patient not taking: Reported on 12/10/2022) 360 capsule 1   No facility-administered medications prior to visit.    Allergies  Allergen Reactions   Penicillins Hives, Rash, Other (See Comments) and Swelling    Has patient had a PCN reaction causing immediate rash, facial/tongue/throat swelling, SOB or lightheadedness with hypotension: Yes Has patient had a PCN reaction causing severe rash involving mucus membranes or skin necrosis: No Has patient had a PCN reaction that required hospitalization No Has patient had a PCN reaction occurring within the last 10 years: No If all of the above answers are "NO", then may proceed with Cephalosporin use.     ROS Review of Systems Negative unless indicated in HPI.    Objective:    Physical Exam  BP 124/64   Pulse 93   Temp 97.7 F (36.5 C)   Resp 16   Ht 5\' 10"  (1.778 m)   Wt 228 lb (103.4 kg)   SpO2 95%   BMI 32.71 kg/m  Wt Readings from Last 3 Encounters:  12/10/22 228  lb (103.4 kg)  09/29/22 247 lb (112 kg)  09/09/22 247 lb 3.2 oz (112.1 kg)     Health Maintenance  Topic Date Due   COVID-19 Vaccine (4 - 2023-24 season) 12/25/2021   HEMOGLOBIN A1C  09/28/2022   INFLUENZA VACCINE  11/25/2022   Diabetic kidney evaluation - Urine ACR  01/26/2023   Medicare Annual Wellness (AWV)  04/29/2023   OPHTHALMOLOGY EXAM   05/04/2023   FOOT EXAM  07/14/2023   Diabetic kidney evaluation - eGFR measurement  11/22/2023   DTaP/Tdap/Td (2 - Td or Tdap) 08/01/2028   Pneumonia Vaccine 75+ Years old  Completed   Hepatitis C Screening  Completed   Zoster Vaccines- Shingrix  Completed   HPV VACCINES  Aged Out   Colonoscopy  Discontinued    There are no preventive care reminders to display for this patient.  Lab Results  Component Value Date   TSH 1.45 01/25/2022   Lab Results  Component Value Date   WBC 7.5 08/09/2022   HGB 14.9 08/09/2022   HCT 44.0 08/09/2022   MCV 91.7 08/09/2022   PLT 173 08/09/2022   Lab Results  Component Value Date   NA 134 (L) 11/22/2022   K 4.7 11/22/2022   CO2 26 11/22/2022   GLUCOSE 239 (H) 11/22/2022   BUN 19 11/22/2022   CREATININE 1.25 (H) 11/22/2022   BILITOT 0.9 03/28/2022   ALKPHOS 92 03/28/2022   AST 31 03/28/2022   ALT 28 09/09/2022   PROT 7.5 03/28/2022   ALBUMIN 4.0 03/28/2022   CALCIUM 9.1 11/22/2022   ANIONGAP 11 11/22/2022   GFR 73.02 01/25/2022   Lab Results  Component Value Date   CHOL 92 09/09/2022   Lab Results  Component Value Date   HDL 26 (L) 09/09/2022   Lab Results  Component Value Date   LDLCALC 36 09/09/2022   Lab Results  Component Value Date   TRIG 150 (H) 09/09/2022   Lab Results  Component Value Date   CHOLHDL 3.5 09/09/2022   Lab Results  Component Value Date   HGBA1C 7.3 (H) 03/29/2022      Assessment & Plan:  There are no diagnoses linked to this encounter.  Follow-up: No follow-ups on file.   Kara Dies, NP

## 2022-12-13 ENCOUNTER — Other Ambulatory Visit: Payer: Self-pay | Admitting: Internal Medicine

## 2022-12-13 ENCOUNTER — Other Ambulatory Visit: Payer: Self-pay

## 2022-12-13 DIAGNOSIS — E039 Hypothyroidism, unspecified: Secondary | ICD-10-CM

## 2022-12-13 MED ORDER — LEVOTHYROXINE SODIUM 200 MCG PO TABS
200.0000 ug | ORAL_TABLET | Freq: Every day | ORAL | 3 refills | Status: DC
Start: 2022-12-13 — End: 2023-11-04

## 2022-12-13 NOTE — Telephone Encounter (Signed)
levothyroxine (SYNTHROID) 200 MCG tablet LOV 12/10/2022 NOV 02/14/2023

## 2022-12-15 NOTE — Assessment & Plan Note (Signed)
Right-sided low back pain with sciatica. Referral sent to pain management.

## 2022-12-16 DIAGNOSIS — X32XXXA Exposure to sunlight, initial encounter: Secondary | ICD-10-CM | POA: Diagnosis not present

## 2022-12-16 DIAGNOSIS — D485 Neoplasm of uncertain behavior of skin: Secondary | ICD-10-CM | POA: Diagnosis not present

## 2022-12-16 DIAGNOSIS — D2272 Melanocytic nevi of left lower limb, including hip: Secondary | ICD-10-CM | POA: Diagnosis not present

## 2022-12-16 DIAGNOSIS — D2271 Melanocytic nevi of right lower limb, including hip: Secondary | ICD-10-CM | POA: Diagnosis not present

## 2022-12-16 DIAGNOSIS — D2261 Melanocytic nevi of right upper limb, including shoulder: Secondary | ICD-10-CM | POA: Diagnosis not present

## 2022-12-16 DIAGNOSIS — L570 Actinic keratosis: Secondary | ICD-10-CM | POA: Diagnosis not present

## 2022-12-16 DIAGNOSIS — Z85828 Personal history of other malignant neoplasm of skin: Secondary | ICD-10-CM | POA: Diagnosis not present

## 2022-12-16 DIAGNOSIS — D044 Carcinoma in situ of skin of scalp and neck: Secondary | ICD-10-CM | POA: Diagnosis not present

## 2022-12-16 DIAGNOSIS — D2262 Melanocytic nevi of left upper limb, including shoulder: Secondary | ICD-10-CM | POA: Diagnosis not present

## 2022-12-17 ENCOUNTER — Telehealth: Payer: Self-pay | Admitting: Family Medicine

## 2022-12-17 DIAGNOSIS — K219 Gastro-esophageal reflux disease without esophagitis: Secondary | ICD-10-CM

## 2022-12-17 MED ORDER — PANTOPRAZOLE SODIUM 40 MG PO TBEC
40.0000 mg | DELAYED_RELEASE_TABLET | Freq: Every day | ORAL | 3 refills | Status: DC
Start: 2022-12-17 — End: 2023-11-04

## 2022-12-17 NOTE — Telephone Encounter (Signed)
Prescription Request  12/17/2022  LOV: 07/14/2022  What is the name of the medication or equipment? pantoprazole (PROTONIX) 40 MG tablet  Have you contacted your pharmacy to request a refill? Yes   Which pharmacy would you like this sent to?   [facility] [id number] [place], [place] - [address] [place] [address] Phone: [phone] Fax: [phone]    Patient notified that their request is being sent to the clinical staff for review and that they should receive a response within 2 business days.   Please advise at Mobile [phone] (mobile)

## 2022-12-17 NOTE — Telephone Encounter (Signed)
refilled 

## 2022-12-23 DIAGNOSIS — D044 Carcinoma in situ of skin of scalp and neck: Secondary | ICD-10-CM | POA: Diagnosis not present

## 2022-12-28 ENCOUNTER — Ambulatory Visit: Payer: Medicare Other | Attending: Anesthesiology | Admitting: Anesthesiology

## 2022-12-28 ENCOUNTER — Encounter: Payer: Self-pay | Admitting: Anesthesiology

## 2022-12-28 VITALS — BP 135/70 | HR 97 | Temp 98.4°F | Resp 18 | Ht 70.0 in | Wt 225.0 lb

## 2022-12-28 DIAGNOSIS — M47816 Spondylosis without myelopathy or radiculopathy, lumbar region: Secondary | ICD-10-CM | POA: Diagnosis not present

## 2022-12-28 DIAGNOSIS — M48062 Spinal stenosis, lumbar region with neurogenic claudication: Secondary | ICD-10-CM

## 2022-12-28 DIAGNOSIS — M5136 Other intervertebral disc degeneration, lumbar region: Secondary | ICD-10-CM

## 2022-12-28 DIAGNOSIS — R29898 Other symptoms and signs involving the musculoskeletal system: Secondary | ICD-10-CM

## 2022-12-28 DIAGNOSIS — M48061 Spinal stenosis, lumbar region without neurogenic claudication: Secondary | ICD-10-CM | POA: Insufficient documentation

## 2022-12-28 MED ORDER — TRAMADOL HCL 50 MG PO TABS: 100.0000 mg | ORAL_TABLET | ORAL | 1 refills | Status: DC

## 2022-12-28 NOTE — Progress Notes (Signed)
Subjective:  Patient ID: Tony Lawrence, male    DOB: 1945/10/18  Age: 77 y.o. MRN: [id number]  CC: Back Pain and Leg Pain (Right )     PROCEDURE: None  HPI Tony Lawrence presents for new patient evaluation [name] is a 77 year old white male with a longstanding history of low back pain and spinal stenosis.  He maintains that the pain has been been markedly worse following a steroid injection for his low back pain back in June.  Though he has had some chronic right lower extremity weakness it seems to been worse since that time.  He maintains that he is occasionally following under certain conditions where the leg gives out on him.  He maintains that his maximum pain score is a 9.5 when it is it is worse and at present of 4 but most the time of 4-5.  Following this most recent injection he was not able to get more than 2 hours of relief.  He takes tramadol up to 3 times a day and this gives him about 50 to 70% relief but slowly over the past several weeks the pain intensity has diminished but it is still not back to baseline from his history.  It is worse with prolonged standing or walking better with cold pack hot pack medication management or setting.  He has associated weakness in the right knee and the leg and describes the pain as aching burning constant sharp and shooting.  He has had previous MRI scans and previous neurosurgical evaluation with Dr. Lovell Sheehan most recently in [place].  He was determined to be a nonsurgical candidate at that point.  He also maintains that epidural steroids generally have helped him but this last 1 seemed to aggravate the pain.  Physical therapy has been ineffective.  The tramadol taken 3 times a day works well but he frequently needs 1/4 tablet at bedtime to help with sleep.  No side effects of the medication are noted.  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) (11/04/2015), Chronic diarrhea (12/29/2021), Complication of anesthesia, Contusion of toenail (02/27/2021), Coronary artery disease, COVID-19, Diabetes mellitus (HCC) (03/12/2013), Diabetes mellitus without complication (HCC), Diarrhea (03/21/2018), Erectile dysfunction due to arterial insufficiency (04/24/2019), Essential hypertension (12/22/2017), Flank pain (11/30/2013), GERD (gastroesophageal reflux disease), H/O total knee replacement, left (11/15/2019), History of chicken pox, History of shingles, History of skin cancer (03/21/2018), History of tongue cancer (03/21/2018), HLD (hyperlipidemia) (03/28/2022), Hyperlipidemia, Hypertension, Hypothyroidism, Increased frequency of urination (03/12/2013), Malignant neoplasm of tongue, tip and lateral border (HCC) (11/04/2015), Mitral valve insufficiency (12/22/2019), Neuritis or radiculitis due to rupture of lumbar intervertebral disc (01/16/2014), Onychomycosis (02/27/2021), OSA on CPAP, Other intervertebral disc displacement, lumbar region (01/16/2014), Parapelvic renal cyst (12/18/2013), Primary osteoarthritis of both knees (05/13/2014), Primary osteoarthritis of left knee (11/22/2018), SCC (squamous cell carcinoma), Slowing of urinary stream (03/12/2013), Squamous cell carcinoma in situ, Squamous cell carcinoma in situ, Stroke (HCC), Thrombocytopenia (HCC) (01/27/2022), Tongue cancer (HCC), Tongue cancer (HCC), Urinary hesitancy (03/12/2013), and Valvular heart disease (03/23/2021).   He has a past surgical history that includes Colonoscopy (05/2006); Biopsy tongue; Cardiac catheterization (N/A,  11/10/2015); Excision of tongue lesion (N/A, 11/11/2015); Colonoscopy  with propofol (N/A, 05/24/2017); Replacement total knee (Left); right tka minimally invasive 05/06/20 Dr. Laren Everts; cancer of tongue (08/2020); and Replacement total knee (Right, 04/2020).   His family history includes Alcohol abuse in his father; Arthritis in his mother; COPD in his father; Depression in his father; Early death in his maternal grandfather; Emphysema in his father; Heart disease in his maternal grandfather; Myelodysplastic syndrome in his mother; Stroke in his maternal grandfather.He reports that he has never smoked. He has never used smokeless tobacco. He reports that he does not drink alcohol and does not use drugs.  No valid procedures specified.  No results found for: "TOXASSSELUR"  Outpatient Medications Prior to Visit  Medication Sig Dispense Refill   Acetaminophen (TYLENOL PO) Take 1,300 mg by mouth every other day.     atorvastatin (LIPITOR) 20 MG tablet Take 1 tablet (20 mg total) by mouth daily at 6 PM. At night 90 tablet 3   clopidogrel (PLAVIX) 75 MG tablet TAKE 1 TABLET BY MOUTH DAILY 90 tablet 0   co-enzyme Q-10 30 MG capsule Take 30 mg by mouth 3 (three) times daily.     glimepiride (AMARYL) 1 MG tablet Take 1 tablet (1 mg total) by mouth daily with breakfast. 90 tablet 3   glucose blood test strip E 11.9 qd Once Daily one touch 100 each 12   hydrochlorothiazide (HYDRODIURIL) 12.5 MG tablet Take 1 tablet (12.5 mg total) by mouth daily. 90 tablet 3   isosorbide mononitrate (IMDUR) 30 MG 24 hr tablet TAKE 1 TABLET BY MOUTH EVERY MORNING AND TAKE TWO TABLETS BY MOUTH EVERY EVENING 270 tablet 0   JANUVIA 100 MG tablet Take 100 mg by mouth daily.     levothyroxine (SYNTHROID) 200 MCG tablet Take 1 tablet (200 mcg total) by mouth daily. In am on empty stomach 90 tablet 3   liothyronine (CYTOMEL) 5 MCG tablet Take 0.5 tablets (2.5 mcg total) by mouth every other day. 45 tablet 3   losartan  (COZAAR) 50 MG tablet Take 1 tablet (50 mg total) by mouth in the morning and at bedtime. Stop ramipril 20 mg qd, d/c losartan 25 mg qd 180 tablet 3   Multiple Vitamins-Minerals (CENTRUM SILVER 50+MEN PO) Take 1 tablet by mouth every morning.      OneTouch Delica Lancets 33G MISC Check blood sugars twice weekly 100 each 1   pantoprazole (PROTONIX) 40 MG tablet Take 1 tablet (40 mg total) by mouth daily. 90 tablet 3   traMADol (ULTRAM) 50 MG tablet Take 50 mg by mouth 3 (three) times daily.     No facility-administered medications prior to visit.   Lab Results  Component Value Date   WBC 7.5 08/09/2022   HGB 14.9 08/09/2022   HCT 44.0 08/09/2022   PLT 173 08/09/2022   GLUCOSE 239 (H) 11/22/2022   CHOL 92 09/09/2022   TRIG 150 (H) 09/09/2022   HDL 26 (L) 09/09/2022   LDLDIRECT 31.0 01/25/2022   LDLCALC 36 09/09/2022   ALT 28 09/09/2022   AST 31 03/28/2022   NA 134 (L) 11/22/2022   K 4.7 11/22/2022   CL 97 (L) 11/22/2022   CREATININE 1.25 (H) 11/22/2022   BUN 19 11/22/2022   CO2 26 11/22/2022   TSH 1.45 01/25/2022   PSA 0.90 03/01/2019   INR 1.2 03/28/2022   HGBA1C 7.3 (H) 03/29/2022   MICROALBUR 9.9 01/25/2022    --------------------------------------------------------------------------------------------------------------------- No results found.     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) 11/04/2015   Chronic diarrhea 12/29/2021   Complication of anesthesia    SEVERE SORE THROAT AFTER BIOPSY 2010   Contusion of toenail 02/27/2021   Coronary artery disease    Chronic total occlusion of mid LAD   COVID-19    11/17/20   Diabetes mellitus (HCC) 03/12/2013   Diabetes mellitus without complication (HCC)    Diarrhea 03/21/2018   Erectile dysfunction due to arterial insufficiency 04/24/2019   Essential hypertension 12/22/2017   Flank pain 11/30/2013   GERD (gastroesophageal reflux disease)    H/O total knee replacement, 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   History of chicken pox    History of shingles    History of skin cancer 03/21/2018   History of tongue cancer 03/21/2018   HLD (hyperlipidemia) 03/28/2022   Hyperlipidemia    Hypertension    Hypothyroidism    Increased frequency of urination 03/12/2013   Malignant neoplasm of tongue, tip and lateral border (HCC) 11/04/2015   Mitral valve insufficiency 12/22/2019   Neuritis or radiculitis due to rupture of lumbar intervertebral disc 01/16/2014   Onychomycosis 02/27/2021   OSA on CPAP    Other intervertebral disc displacement, lumbar region 01/16/2014   Parapelvic renal cyst 12/18/2013   Primary osteoarthritis of both knees 05/13/2014   Primary osteoarthritis of left knee 11/22/2018   SCC (squamous cell carcinoma)    invasive left Post. lateral Tongue [facility] Dr. Roma Schanz excised 08/25/20 surgical exc [facility] ?recurrent vs new primary    Slowing of urinary stream 03/12/2013   Squamous cell carcinoma in situ    nose 2021    Squamous cell carcinoma in situ    nose 2021    Stroke (HCC)    Thrombocytopenia (HCC) 01/27/2022   Tongue cancer (HCC)    Squamous cell CA of tongue 11/11/15 no chemo or radiation ENT Dr. Jenne Campus, [facility] H/o    Tongue cancer [facility])    Squamous cell CA of tongue 11/11/15 no chemo or radiation ENT Dr. Jenne Campus, [facility] H/o    Urinary  hesitancy 03/12/2013   Valvular heart disease 03/23/2021    Past Surgical History:  Procedure Laterality Date   BIOPSY TONGUE     11/11/15 SCC tongue    cancer of tongue  08/2020   removal of cancer   CARDIAC CATHETERIZATION N/A 11/10/2015   Procedure: Left Heart Cath and Coronary Angiography;  Surgeon: Iran Ouch, MD;  Location: [facility] INVASIVE CV LAB;  Service: Cardiovascular;  Laterality: N/A;   COLONOSCOPY  05/2006   COLONOSCOPY WITH PROPOFOL N/A 05/24/2017   Procedure: COLONOSCOPY WITH PROPOFOL;  Surgeon: Christena Deem, MD;  Location: [facility] ENDOSCOPY;  Service: Endoscopy;  Laterality: N/A;   EXCISION OF TONGUE LESION N/A 11/11/2015   Procedure: EXCISION OF TONGUE LESION/ WITH FROZEN SECTION;  Surgeon: Linus Salmons, MD;  Location: [facility] ORS;  Service: ENT;  Laterality: N/A;   REPLACEMENT TOTAL KNEE  Left    REPLACEMENT TOTAL KNEE Right 04/2020   [facility]   right tka minimally invasive 05/06/20 Dr. Laren Everts      Family History  Problem Relation Age of Onset   Myelodysplastic syndrome Mother    Arthritis Mother    Emphysema Father    Alcohol abuse Father    COPD Father    Depression Father    Early death Maternal Grandfather    Heart disease Maternal Grandfather    Stroke Maternal Grandfather     Social History   Tobacco Use   Smoking status: Never   Smokeless tobacco: Never  Substance Use Topics   Alcohol use: Never    Alcohol/week: 0.0 standard drinks of alcohol    ---------------------------------------------------------------------------------------------------------------------  Scheduled Meds: Continuous Infusions: PRN Meds:.   BP 135/70   Pulse 97   Temp 98.4 F (36.9 C) (Temporal)   Resp 18   Ht 5\' 10"  (1.778 m)   Wt 225 lb (102.1 kg)   SpO2 99%   BMI 32.28 kg/m    BP Readings from Last 3 Encounters:  12/28/22 135/70  12/10/22 124/64  09/29/22 (!) 158/64     Wt Readings from Last 3 Encounters:   12/28/22 225 lb (102.1 kg)  12/10/22 228 lb (103.4 kg)  09/29/22 247 lb (112 kg)     ----------------------------------------------------------------------------------------------------------------------  ROS Review of Systems CNS: No headaches or confusion Cardiac: No angina or palpitations Lungs: No shortness of breath or wheezing GI: No constipation or obstipation or diarrhea Musculoskeletal as above.  Objective:  BP 135/70   Pulse 97   Temp 98.4 F (36.9 C) (Temporal)   Resp 18   Ht 5\' 10"  (1.778 m)   Wt 225 lb (102.1 kg)   SpO2 99%   BMI 32.28 kg/m   Physical Exam Patient is alert oriented cooperative compliant and good historian. He presents today with his wife. CNS: Pupils are equally round reactive to light extraocular muscles are intact Heart is regular rate and rhythm Inspection of the low back reveals some paraspinous muscle tenderness but no overt trigger points.  He does have 4/5 strength with extension of the right knee 5/5 with flexion at the right knee otherwise lower extremity strength appears to be fully intact.  Muscle tone and bulk is good.  He does have difficulty going from seated to standing and during evaluation for gastroc flexion he nearly fell.  He maintains this is secondary to giveaway weakness at the right knee.     Assessment & Plan:   Dymere was seen today for back pain and leg pain.  Diagnoses and all orders for this visit:  Spinal stenosis of lumbar region with neurogenic claudication -     Ambulatory referral to Neurosurgery  Weakness of right leg -     Ambulatory referral to Neurosurgery  DDD (degenerative disc disease), lumbar  Facet arthritis of lumbar region  Other orders -     traMADol (ULTRAM) 50 MG tablet; Take 2 tablets (100 mg total) by mouth every 4 (four) hours.     ----------------------------------------------------------------------------------------------------------------------  Problem List Items  Addressed This Visit       Unprioritized   DDD (degenerative disc disease), lumbar   Relevant Medications   traMADol (ULTRAM) 50 MG tablet (Start on 12/31/2022)   Facet arthritis of lumbar region   Relevant Medications   traMADol (ULTRAM) 50 MG tablet (Start on 12/31/2022)   Spinal stenosis of lumbar region - Primary   Relevant Orders  Ambulatory referral to Neurosurgery   Weakness of right leg   Relevant Orders   Ambulatory referral to Neurosurgery    ----------------------------------------------------------------------------------------------------------------------  1. Spinal stenosis of lumbar region with neurogenic claudication Unfortunately Mr. Pirrello continues to have chronic back pain which is very limiting to the point where his exercise capacity is diminished and he is requiring a cane for assistance and he can no longer drive according to him.  I would like to refer him to Dr. Marcell Barlow for evaluation and consideration for interventional spinal surgery.  At this point I do not feel he is a good repeat injections.  Ultimately he may be a candidate for dorsal column stimulator. - Ambulatory referral to Neurosurgery  2. Weakness of right leg As above - Ambulatory referral to Neurosurgery  3. DDD (degenerative disc disease), lumbar As above  4. Facet arthritis of lumbar region Additionally, he has responded favorably to tramadol and I think it is appropriate to increase this dose to enable him better pain control throughout the day.  I am going to prescribe every 4 hour to every 6 hour usage to initially go to 4 tablets/day.  Ultimately he could use 2 tablets in the morning for total of 5 tablets during the day to be taken 3 times daily dosing as need.  Will schedule him for a 1 month return to clinic.  I have encouraged him to continue follow-up with his primary care physician for baseline medical care.  Furthermore he has physical therapy already established and I have  reaffirmed the need to follow-up with that as this has not been initiated at this point.    ----------------------------------------------------------------------------------------------------------------------  I am having Matheo J. Freiermuth start on traMADol. I am also having him maintain his Multiple Vitamins-Minerals (CENTRUM SILVER 50+MEN PO), Acetaminophen (TYLENOL PO), glucose blood, co-enzyme Q-10, losartan, OneTouch Delica Lancets 33G, atorvastatin, glimepiride, hydrochlorothiazide, liothyronine, clopidogrel, Januvia, traMADol, isosorbide mononitrate, levothyroxine, and pantoprazole.   Meds ordered this encounter  Medications   traMADol (ULTRAM) 50 MG tablet    Sig: Take 2 tablets (100 mg total) by mouth every 4 (four) hours.    Dispense:  150 tablet    Refill:  1       Follow-up: Return in about 1 month (around 01/27/2023) for evaluation, med refill.    Yevette Edwards, MD 3:24 PM  The [facility] practitioner database for opioid medications on this patient has been reviewed by me and my staff   Greater than 50% of the total encounter time was spent in counseling and / or coordination of care.     This dictation was performed utilizing Conservation officer, historic buildings.  Please excuse any unintentional or mistaken typographical errors as a result.

## 2022-12-28 NOTE — Progress Notes (Signed)
Safety precautions to be maintained throughout the outpatient stay will include: orient to surroundings, keep bed in low position, maintain call bell within reach at all times, provide assistance with transfer out of bed and ambulation.  

## 2023-01-06 DIAGNOSIS — M5416 Radiculopathy, lumbar region: Secondary | ICD-10-CM | POA: Diagnosis not present

## 2023-01-06 DIAGNOSIS — R262 Difficulty in walking, not elsewhere classified: Secondary | ICD-10-CM | POA: Diagnosis not present

## 2023-01-10 DIAGNOSIS — R262 Difficulty in walking, not elsewhere classified: Secondary | ICD-10-CM | POA: Diagnosis not present

## 2023-01-10 DIAGNOSIS — M5416 Radiculopathy, lumbar region: Secondary | ICD-10-CM | POA: Diagnosis not present

## 2023-01-12 DIAGNOSIS — R262 Difficulty in walking, not elsewhere classified: Secondary | ICD-10-CM | POA: Diagnosis not present

## 2023-01-12 DIAGNOSIS — M5416 Radiculopathy, lumbar region: Secondary | ICD-10-CM | POA: Diagnosis not present

## 2023-01-17 DIAGNOSIS — R262 Difficulty in walking, not elsewhere classified: Secondary | ICD-10-CM | POA: Diagnosis not present

## 2023-01-17 DIAGNOSIS — M5416 Radiculopathy, lumbar region: Secondary | ICD-10-CM | POA: Diagnosis not present

## 2023-01-19 DIAGNOSIS — C029 Malignant neoplasm of tongue, unspecified: Secondary | ICD-10-CM | POA: Diagnosis not present

## 2023-01-20 DIAGNOSIS — M5416 Radiculopathy, lumbar region: Secondary | ICD-10-CM | POA: Diagnosis not present

## 2023-01-20 DIAGNOSIS — R262 Difficulty in walking, not elsewhere classified: Secondary | ICD-10-CM | POA: Diagnosis not present

## 2023-01-21 ENCOUNTER — Encounter: Payer: Self-pay | Admitting: Nurse Practitioner

## 2023-01-21 ENCOUNTER — Ambulatory Visit: Payer: Medicare Other | Attending: Nurse Practitioner | Admitting: Cardiology

## 2023-01-21 VITALS — BP 118/74 | HR 82 | Ht 70.0 in | Wt 228.4 lb

## 2023-01-21 DIAGNOSIS — E785 Hyperlipidemia, unspecified: Secondary | ICD-10-CM | POA: Diagnosis not present

## 2023-01-21 DIAGNOSIS — I351 Nonrheumatic aortic (valve) insufficiency: Secondary | ICD-10-CM | POA: Insufficient documentation

## 2023-01-21 DIAGNOSIS — G459 Transient cerebral ischemic attack, unspecified: Secondary | ICD-10-CM | POA: Insufficient documentation

## 2023-01-21 DIAGNOSIS — I77810 Thoracic aortic ectasia: Secondary | ICD-10-CM | POA: Insufficient documentation

## 2023-01-21 DIAGNOSIS — I25118 Atherosclerotic heart disease of native coronary artery with other forms of angina pectoris: Secondary | ICD-10-CM | POA: Insufficient documentation

## 2023-01-21 DIAGNOSIS — I10 Essential (primary) hypertension: Secondary | ICD-10-CM | POA: Insufficient documentation

## 2023-01-21 DIAGNOSIS — E1169 Type 2 diabetes mellitus with other specified complication: Secondary | ICD-10-CM | POA: Insufficient documentation

## 2023-01-21 NOTE — Patient Instructions (Signed)
Medication Instructions:  No changes *If you need a refill on your cardiac medications before your next appointment, please call your pharmacy*   Lab Work: None ordered If you have labs (blood work) drawn today and your tests are completely normal, you will receive your results only by: MyChart Message (if you have MyChart) OR A paper copy in the mail If you have any lab test that is abnormal or we need to change your treatment, we will call you to review the results.   Testing/Procedures: None ordered   Follow-Up: At [facility], you and your health needs are our priority.  As part of our continuing mission to provide you with exceptional heart care, we have created designated Provider Care Teams.  These Care Teams include your primary Cardiologist (physician) and Advanced Practice Providers (APPs -  Physician Assistants and Nurse Practitioners) who all work together to provide you with the care you need, when you need it.  We recommend signing up for the patient portal called "MyChart".  Sign up information is provided on this After Visit Summary.  MyChart is used to connect with patients for Virtual Visits (Telemedicine).  Patients are able to view lab/test results, encounter notes, upcoming appointments, etc.  Non-urgent messages can be sent to your provider as well.   To learn more about what you can do with MyChart, go to [email or web address].    Your next appointment:   6 month(s)  Provider:   Dr. Okey Dupre

## 2023-01-24 DIAGNOSIS — M5416 Radiculopathy, lumbar region: Secondary | ICD-10-CM | POA: Diagnosis not present

## 2023-01-24 DIAGNOSIS — R262 Difficulty in walking, not elsewhere classified: Secondary | ICD-10-CM | POA: Diagnosis not present

## 2023-01-26 DIAGNOSIS — M5416 Radiculopathy, lumbar region: Secondary | ICD-10-CM | POA: Diagnosis not present

## 2023-01-26 DIAGNOSIS — R262 Difficulty in walking, not elsewhere classified: Secondary | ICD-10-CM | POA: Diagnosis not present

## 2023-01-31 DIAGNOSIS — R262 Difficulty in walking, not elsewhere classified: Secondary | ICD-10-CM | POA: Diagnosis not present

## 2023-01-31 DIAGNOSIS — M5416 Radiculopathy, lumbar region: Secondary | ICD-10-CM | POA: Diagnosis not present

## 2023-02-01 ENCOUNTER — Encounter: Payer: Self-pay | Admitting: Anesthesiology

## 2023-02-01 ENCOUNTER — Ambulatory Visit: Payer: Medicare Other | Attending: Anesthesiology | Admitting: Anesthesiology

## 2023-02-01 DIAGNOSIS — M47816 Spondylosis without myelopathy or radiculopathy, lumbar region: Secondary | ICD-10-CM | POA: Diagnosis not present

## 2023-02-01 DIAGNOSIS — M5136 Other intervertebral disc degeneration, lumbar region with discogenic back pain only: Secondary | ICD-10-CM

## 2023-02-01 DIAGNOSIS — M48062 Spinal stenosis, lumbar region with neurogenic claudication: Secondary | ICD-10-CM | POA: Diagnosis not present

## 2023-02-01 DIAGNOSIS — R29898 Other symptoms and signs involving the musculoskeletal system: Secondary | ICD-10-CM | POA: Diagnosis not present

## 2023-02-01 NOTE — Progress Notes (Signed)
Virtual Visit via Telephone Note  I connected with Tony Lawrence on 02/01/23 at  1:40 PM EDT by telephone and verified that I am speaking with the correct person using two identifiers.  Location: Patient: Home Provider: [facility]   I discussed the limitations, risks, security and privacy concerns of performing an evaluation and management service by telephone and the availability of in person appointments. I also discussed with the patient that there may be a patient responsible charge related to this service. The patient expressed understanding and agreed to proceed.   History of Present Illness: I was able to reach [facility] today via telephone as we were unable to link for the video portion of the conference.  He reports that the tramadol is working well for his low back pain.  He takes 1 pill early in the morning 1 in the afternoon and 1 just after dinner and 1 or 2 tablets at bedtime or just upon awakening in the early morning to help with pain relief.  This is generally giving him 50 to 75% relief with no associated side effects.  The quality characteristic and distribution of this pain is remained stable.  No other changes in lower extremity strength function bowel or bladder function are noted.  No side effects reported.  He is due to follow-up with Dr. Osborne Oman office as he has not established a clinic appointment.  I have encouraged him to contact their office for evaluation.  Review of systems: General: No fevers or chills Pulmonary: No shortness of breath or dyspnea Cardiac: No angina or palpitations or lightheadedness GI: No abdominal pain or constipation Psych: No depression    Observations/Objective:  Current Outpatient Medications:    Acetaminophen (TYLENOL PO), Take 1,300 mg by mouth every other day., Disp: , Rfl:    atorvastatin (LIPITOR) 20 MG tablet, Take 1 tablet (20 mg total) by mouth daily at 6 PM. At night, Disp: 90 tablet, Rfl: 3   clopidogrel  (PLAVIX) 75 MG tablet, TAKE 1 TABLET BY MOUTH DAILY, Disp: 90 tablet, Rfl: 0   co-enzyme Q-10 30 MG capsule, Take 30 mg by mouth 3 (three) times daily., Disp: , Rfl:    glimepiride (AMARYL) 1 MG tablet, Take 1 tablet (1 mg total) by mouth daily with breakfast., Disp: 90 tablet, Rfl: 3   glucose blood test strip, E 11.9 qd Once Daily one touch, Disp: 100 each, Rfl: 12   hydrochlorothiazide (HYDRODIURIL) 12.5 MG tablet, Take 1 tablet (12.5 mg total) by mouth daily., Disp: 90 tablet, Rfl: 3   isosorbide mononitrate (IMDUR) 30 MG 24 hr tablet, TAKE 1 TABLET BY MOUTH EVERY MORNING AND TAKE TWO TABLETS BY MOUTH EVERY EVENING, Disp: 270 tablet, Rfl: 0   JANUVIA 100 MG tablet, Take 100 mg by mouth daily., Disp: , Rfl:    levothyroxine (SYNTHROID) 200 MCG tablet, Take 1 tablet (200 mcg total) by mouth daily. In am on empty stomach, Disp: 90 tablet, Rfl: 3   liothyronine (CYTOMEL) 5 MCG tablet, Take 0.5 tablets (2.5 mcg total) by mouth every other day., Disp: 45 tablet, Rfl: 3   losartan (COZAAR) 50 MG tablet, Take 1 tablet (50 mg total) by mouth in the morning and at bedtime. Stop ramipril 20 mg qd, d/c losartan 25 mg qd, Disp: 180 tablet, Rfl: 3   Multiple Vitamins-Minerals (CENTRUM SILVER 50+MEN PO), Take 1 tablet by mouth every morning. , Disp: , Rfl:    OneTouch Delica Lancets 33G MISC, Check blood sugars twice weekly,  Disp: 100 each, Rfl: 1   pantoprazole (PROTONIX) 40 MG tablet, Take 1 tablet (40 mg total) by mouth daily., Disp: 90 tablet, Rfl: 3   traMADol (ULTRAM) 50 MG tablet, Take 50 mg by mouth 3 (three) times daily., Disp: , Rfl:    Past Medical History:  Diagnosis Date   Actinic keratoses    Actinic keratoses    Allergy    Aneurysm of ascending aorta without rupture (HCC) 03/27/2022   Annual physical exam 02/27/2021   Aortic valve regurgitation 03/27/2022   Arthritis    low back pain s/p shots and blocks, knee pain   Arthritis of right knee 11/15/2019   BCC (basal cell carcinoma of  skin)    forehead and chest Dr. 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) 11/04/2015   Chronic diarrhea 12/29/2021   Complication of anesthesia    SEVERE SORE THROAT AFTER BIOPSY 2010   Contusion of toenail 02/27/2021   Coronary artery disease    Chronic total occlusion of mid LAD   COVID-19    11/17/20   Diabetes mellitus (HCC) 03/12/2013   Diabetes mellitus without complication (HCC)    Diarrhea 03/21/2018   Erectile dysfunction due to arterial insufficiency 04/24/2019   Essential hypertension 12/22/2017   Flank pain 11/30/2013   GERD (gastroesophageal reflux disease)    H/O total knee replacement, 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   History of chicken pox    History of shingles    History of skin cancer 03/21/2018   History of tongue cancer 03/21/2018   HLD (hyperlipidemia) 03/28/2022   Hyperlipidemia    Hypertension    Hypothyroidism    Increased frequency of urination 03/12/2013   Malignant neoplasm of tongue, tip and lateral border (HCC) 11/04/2015   Mitral valve insufficiency 12/22/2019   Neuritis or radiculitis due to rupture of lumbar intervertebral disc 01/16/2014   Onychomycosis 02/27/2021   OSA on CPAP    Other intervertebral disc displacement, lumbar region 01/16/2014   Parapelvic renal cyst 12/18/2013   Primary osteoarthritis of both knees 05/13/2014   Primary osteoarthritis of left knee 11/22/2018   SCC (squamous cell carcinoma)    invasive left Post. lateral Tongue [facility] Dr. Roma Schanz excised 08/25/20 surgical exc [facility] ?recurrent vs new primary    Slowing of urinary stream 03/12/2013   Squamous cell carcinoma in situ    nose 2021    Squamous cell carcinoma in situ    nose 2021    Stroke (HCC)    Thrombocytopenia (HCC) 01/27/2022   Tongue cancer (HCC)    Squamous cell CA of tongue 11/11/15 no chemo or radiation ENT  Dr. Jenne Campus, [facility] H/o    Tongue cancer [facility])    Squamous cell CA of tongue 11/11/15 no chemo or radiation ENT Dr. Jenne Campus, [facility] H/o    Urinary hesitancy 03/12/2013   Valvular heart disease 03/23/2021     Assessment and Plan: 1. Spinal stenosis of lumbar region with neurogenic claudication   2. Weakness of right leg   3. Degeneration of intervertebral disc of lumbar region with discogenic back pain   4. Facet arthritis of lumbar region   Based on our conversation it is appropriate to continue with his current medication regimen.  I am pleased that he is getting such good relief with the tramadol.  The safety profile is good with no  side effects reported.  Will have him continue this with no other changes noted.  He is scheduled for a 26-month return to clinic.  Continue core stretching strengthening exercises as reviewed.  Continue follow-up with Dr. Marcell Barlow once established for evaluation.  Continue follow-up with his primary care physicians for baseline medical care.  Follow Up Instructions:    I discussed the assessment and treatment plan with the patient. The patient was provided an opportunity to ask questions and all were answered. The patient agreed with the plan and demonstrated an understanding of the instructions.   The patient was advised to call back or seek an in-person evaluation if the symptoms worsen or if the condition fails to improve as anticipated.  I provided 30 minutes of non-face-to-face time during this encounter.   Yevette Edwards, MD

## 2023-02-02 DIAGNOSIS — C029 Malignant neoplasm of tongue, unspecified: Secondary | ICD-10-CM | POA: Diagnosis not present

## 2023-02-02 DIAGNOSIS — R599 Enlarged lymph nodes, unspecified: Secondary | ICD-10-CM | POA: Diagnosis not present

## 2023-02-02 DIAGNOSIS — M5416 Radiculopathy, lumbar region: Secondary | ICD-10-CM | POA: Diagnosis not present

## 2023-02-02 DIAGNOSIS — R262 Difficulty in walking, not elsewhere classified: Secondary | ICD-10-CM | POA: Diagnosis not present

## 2023-02-07 DIAGNOSIS — M5416 Radiculopathy, lumbar region: Secondary | ICD-10-CM | POA: Diagnosis not present

## 2023-02-07 DIAGNOSIS — R262 Difficulty in walking, not elsewhere classified: Secondary | ICD-10-CM | POA: Diagnosis not present

## 2023-02-09 ENCOUNTER — Ambulatory Visit: Payer: Medicare Other

## 2023-02-09 DIAGNOSIS — M5416 Radiculopathy, lumbar region: Secondary | ICD-10-CM | POA: Diagnosis not present

## 2023-02-09 DIAGNOSIS — R262 Difficulty in walking, not elsewhere classified: Secondary | ICD-10-CM | POA: Diagnosis not present

## 2023-02-11 ENCOUNTER — Other Ambulatory Visit: Payer: Self-pay | Admitting: Otolaryngology

## 2023-02-11 DIAGNOSIS — R591 Generalized enlarged lymph nodes: Secondary | ICD-10-CM

## 2023-02-14 ENCOUNTER — Ambulatory Visit (INDEPENDENT_AMBULATORY_CARE_PROVIDER_SITE_OTHER): Payer: Medicare Other | Admitting: Family Medicine

## 2023-02-14 ENCOUNTER — Encounter: Payer: Self-pay | Admitting: Family Medicine

## 2023-02-14 VITALS — BP 120/72 | HR 95 | Temp 98.2°F | Resp 16 | Ht 70.0 in | Wt 226.4 lb

## 2023-02-14 DIAGNOSIS — M549 Dorsalgia, unspecified: Secondary | ICD-10-CM | POA: Diagnosis not present

## 2023-02-14 DIAGNOSIS — E118 Type 2 diabetes mellitus with unspecified complications: Secondary | ICD-10-CM | POA: Diagnosis not present

## 2023-02-14 DIAGNOSIS — Z7984 Long term (current) use of oral hypoglycemic drugs: Secondary | ICD-10-CM

## 2023-02-14 DIAGNOSIS — M5430 Sciatica, unspecified side: Secondary | ICD-10-CM

## 2023-02-14 DIAGNOSIS — E1159 Type 2 diabetes mellitus with other circulatory complications: Secondary | ICD-10-CM

## 2023-02-14 DIAGNOSIS — E119 Type 2 diabetes mellitus without complications: Secondary | ICD-10-CM

## 2023-02-14 DIAGNOSIS — I152 Hypertension secondary to endocrine disorders: Secondary | ICD-10-CM | POA: Diagnosis not present

## 2023-02-14 DIAGNOSIS — E039 Hypothyroidism, unspecified: Secondary | ICD-10-CM

## 2023-02-14 LAB — COMPREHENSIVE METABOLIC PANEL
ALT: 21 U/L (ref 0–53)
AST: 25 U/L (ref 0–37)
Albumin: 4.2 g/dL (ref 3.5–5.2)
Alkaline Phosphatase: 70 U/L (ref 39–117)
BUN: 16 mg/dL (ref 6–23)
CO2: 29 meq/L (ref 19–32)
Calcium: 9.5 mg/dL (ref 8.4–10.5)
Chloride: 99 meq/L (ref 96–112)
Creatinine, Ser: 1.2 mg/dL (ref 0.40–1.50)
GFR: 58.24 mL/min — ABNORMAL LOW (ref >60.00)
Glucose, Bld: 168 mg/dL — ABNORMAL HIGH (ref 70–99)
Potassium: 4.8 meq/L (ref 3.5–5.1)
Sodium: 136 meq/L (ref 135–145)
Total Bilirubin: 0.9 mg/dL (ref 0.2–1.2)
Total Protein: 7.3 g/dL (ref 6.0–8.3)

## 2023-02-14 LAB — TSH: TSH: 1.22 u[IU]/mL (ref 0.35–5.50)

## 2023-02-14 LAB — MICROALBUMIN / CREATININE URINE RATIO
Creatinine,U: 91.1 mg/dL
Microalb Creat Ratio: 3.4 mg/g (ref 0.0–30.0)
Microalb, Ur: 3.1 mg/dL — ABNORMAL HIGH (ref 0.0–1.9)

## 2023-02-14 LAB — POCT GLYCOSYLATED HEMOGLOBIN (HGB A1C)
HbA1c POC (<> result, manual entry): 6.1 % (ref 4.0–5.6)
HbA1c, POC (controlled diabetic range): 6.1 % (ref 0.0–7.0)
HbA1c, POC (prediabetic range): 6.1 % (ref 5.7–6.4)
Hemoglobin A1C: 6.1 % — AB (ref 4.0–5.6)

## 2023-02-14 NOTE — Patient Instructions (Addendum)
It was a pleasure meeting you today. Thank you for allowing me to take part in your health care.  Our goals for today as we discussed include:  We will get some labs today.  If they are abnormal or we need to do something about them, I will call you.  If they are normal, I will send you a message on MyChart (if it is active) or a letter in the mail.  If you don't hear from [place] in 2 weeks, please call the office at the number below.   Follow up with Endocrinology as scheduled  Follow up with PT as scheduled  Follow up with PCP as needed  If you have any questions or concerns, please do not hesitate to call the office at [phone].  I look forward to our next visit and until then take care and stay safe.  Regards,   Dana Allan, MD   [facility]

## 2023-02-14 NOTE — Progress Notes (Unsigned)
SUBJECTIVE:   Chief Complaint  Patient presents with   Medical Management of Chronic Issues   HPI Presents to clinic for follow up chronic disease management  Discussed the use of AI scribe software for clinical note transcription with the patient, who gave verbal consent to proceed.  History of Present Illness The patient, with a history of diabetes, hypertension, and back pain, presents for a follow-up visit. She reports an increase in her A1c levels, which she attributes to a series of prednisone treatments for upper back pain and sciatic nerve pain. She has been monitoring her blood sugar levels daily and reports that she has returned to normal.  The patient's back pain has been a significant issue. She underwent physical therapy and received epidural injections, which she has had in the past with some relief. However, the most recent injection reportedly went wrong, causing severe pain and sleep disturbances. The patient was prescribed OxyContin for the pain, which allowed her to sleep for about two hours a night. Over time, the pain began to subside, but she experienced a loss of strength in her leg, leading to three falls. She has since transitioned from OxyContin to hydrocodone and then to tramadol for pain management.  The patient's diabetes is managed by an endocrinologist, and she is currently on Amaryl and Januvia. She reports that her blood sugar levels have been good. She also takes Synthroid for a thyroid condition, and her current regimen includes Cozaar for hypertension and Imdur for heart disease, managed by a cardiologist. The patient denies any chest pain, shortness of breath, or issues with bowel or bladder function. She does report some balance issues, which she attributes to the tramadol.    PERTINENT PMH / PSH: As above  OBJECTIVE:  BP 120/72   Pulse 95   Temp 98.2 F (36.8 C)   Resp 16   Ht 5\' 10"  (1.778 m)   Wt 226 lb 6 oz (102.7 kg)   SpO2 95%   BMI 32.48  kg/m    Physical Exam Vitals reviewed.  Constitutional:      General: He is not in acute distress.    Appearance: Normal appearance. He is obese. He is not ill-appearing, toxic-appearing or diaphoretic.  Eyes:     General:        Right eye: No discharge.        Left eye: No discharge.  Cardiovascular:     Rate and Rhythm: Normal rate and regular rhythm.     Heart sounds: Normal heart sounds.  Pulmonary:     Effort: Pulmonary effort is normal.     Breath sounds: Normal breath sounds.  Abdominal:     General: Bowel sounds are normal.  Musculoskeletal:        General: Normal range of motion.     Cervical back: Normal range of motion.  Skin:    General: Skin is warm and dry.  Neurological:     Mental Status: He is alert and oriented to person, place, and time. Mental status is at baseline.  Psychiatric:        Mood and Affect: Mood normal.        Behavior: Behavior normal.        Thought Content: Thought content normal.        Judgment: Judgment normal.        02/14/2023   11:41 AM 12/28/2022    1:03 PM 12/10/2022   11:04 AM 07/14/2022    9:57 AM 04/28/2022  2:07 PM  Depression screen PHQ 2/9  Decreased Interest 0 0 1 0 0  Down, Depressed, Hopeless 0 0 1 0 0  PHQ - 2 Score 0 0 2 0 0  Altered sleeping 0  2    Tired, decreased energy 0  1    Change in appetite 0  1    Feeling bad or failure about yourself  0  0    Trouble concentrating 0  0    Moving slowly or fidgety/restless 0  0    Suicidal thoughts 0  0    PHQ-9 Score 0  6    Difficult doing work/chores Not difficult at all  Somewhat difficult        02/14/2023   11:41 AM 12/10/2022   11:04 AM 07/14/2022    9:57 AM 11/14/2019    3:35 PM  GAD 7 : Generalized Anxiety Score  Nervous, Anxious, on Edge 0 0 0 0  Control/stop worrying 0 0 0 0  Worry too much - different things 0 0 0 0  Trouble relaxing 0 0 0 0  Restless 0 0 0 0  Easily annoyed or irritable 1 1 0 0  Afraid - awful might happen 0 0 0 0  Total GAD 7  Score 1 1 0 0  Anxiety Difficulty Not difficult at all Somewhat difficult Very difficult Not difficult at all    ASSESSMENT/PLAN:  Type 2 diabetes mellitus without complication, without long-term current use of insulin (HCC) -     POCT glycosylated hemoglobin (Hb A1C) -     Microalbumin / creatinine urine ratio -     Comprehensive metabolic panel  Hypothyroidism, unspecified type Assessment & Plan: Patient is on Synthroid and Cytomel 5mg  (half tablet). TSH has not been checked recently. -Order TSH level today. -Consider discontinuing Cytomel in the future, pending discussion with patient and review of TSH levels.  Orders: -     TSH  Back pain with sciatica Assessment & Plan: Severe pain following epidural injection, requiring opioid medication (OxyContin, hydrocodone, tramadol). Pain has improved and patient is currently weaning off tramadol. -Continue current pain management plan with Dr. Pernell Dupre at the pain management clinic. -Consider surgical consultation for potential spinal fusion as a last resort.   Diabetes mellitus with complication (HCC) Assessment & Plan: A1c increased due to prednisone use, but has since improved (current A1c 6.1). Patient is monitoring blood glucose levels daily and is scheduled to see endocrinologist in two weeks. -Continue current management with Glimepiride 1mg  and Januvia 100mg  daily under the care of endocrinologist.   Hypertension associated with diabetes [facility]) Assessment & Plan: Managed by cardiologist Dr. Okey Dupre. -Continue current management with Losartan and Hydrochlorothiazide as prescribed by cardiologist.     General Health Maintenance -Flu vaccine already administered at [facility]   PDMP reviewed  Return if symptoms worsen or fail to improve, for PCP.  Dana Allan, MD

## 2023-02-15 ENCOUNTER — Ambulatory Visit
Admission: RE | Admit: 2023-02-15 | Discharge: 2023-02-15 | Disposition: A | Discharge status: Home / Self Care | Payer: Medicare Other | Source: Ambulatory Visit | Attending: Otolaryngology | Admitting: Otolaryngology

## 2023-02-15 DIAGNOSIS — R591 Generalized enlarged lymph nodes: Secondary | ICD-10-CM | POA: Diagnosis not present

## 2023-02-15 DIAGNOSIS — R221 Localized swelling, mass and lump, neck: Secondary | ICD-10-CM | POA: Diagnosis not present

## 2023-02-16 ENCOUNTER — Encounter: Payer: Self-pay | Admitting: Family Medicine

## 2023-02-16 ENCOUNTER — Ambulatory Visit: Payer: Medicare Other | Admitting: Nurse Practitioner

## 2023-02-16 ENCOUNTER — Telehealth: Payer: Self-pay

## 2023-02-16 ENCOUNTER — Telehealth: Payer: Self-pay | Admitting: Nurse Practitioner

## 2023-02-16 ENCOUNTER — Encounter: Payer: Self-pay | Admitting: Nurse Practitioner

## 2023-02-16 VITALS — BP 124/70 | HR 91 | Temp 98.1°F | Resp 16 | Ht 70.0 in | Wt 228.4 lb

## 2023-02-16 DIAGNOSIS — G4733 Obstructive sleep apnea (adult) (pediatric): Secondary | ICD-10-CM

## 2023-02-16 DIAGNOSIS — Z7189 Other specified counseling: Secondary | ICD-10-CM | POA: Diagnosis not present

## 2023-02-16 NOTE — Telephone Encounter (Signed)
Lmom that pt need to bring  cpap  SD card for download

## 2023-02-16 NOTE — Assessment & Plan Note (Signed)
Severe pain following epidural injection, requiring opioid medication (OxyContin, hydrocodone, tramadol). Pain has improved and patient is currently weaning off tramadol. -Continue current pain management plan with Dr. Pernell Dupre at the pain management clinic. -Consider surgical consultation for potential spinal fusion as a last resort.

## 2023-02-16 NOTE — Telephone Encounter (Signed)
 Notified [facility] [employer] [facility] of cpap supply order-Toni

## 2023-02-16 NOTE — Assessment & Plan Note (Signed)
Managed by cardiologist Dr. Okey Dupre. -Continue current management with Losartan and Hydrochlorothiazide as prescribed by cardiologist.

## 2023-02-16 NOTE — Progress Notes (Signed)
[facility] [address] [place], [place] [address]  Internal MEDICINE  Office Visit Note  Patient Name: Tony Lawrence  [id number]  [id number]  Date of Service: 02/16/2023  Chief Complaint  Patient presents with   Follow-up    HPI Tony Lawrence presents for a follow-up visit for OSA on CPAP OSA on CPAP Waking up feeling rested Wear CPAP for at least 7-8 hours per night.  Doing well with full face mask. Except there there is an increased leak which has correlated with a slight increase in AHI. Needing additional supplies for the full face mask   CPAP Download Min pressure 6 cmH2O Max pressure 9 cmH2O 95 percentile pressure 9.0 95th percentile leak 7.2 apnea index 0.3 /hr Hypopnea index 3.4 /hr apnea-hypopnea index  3.7 /hr total days used  >4 hr 89 days total days used <4 hr 1 days Total compliance 99 percent    EPWORTH SLEEPINESS SCALE: Scale: (0)= no chance of dozing; (1)= slight chance of dozing; (2)= moderate chance of dozing; (3)= high chance of dozing Chance  Situtation Sitting and reading: 0 Watching TV: 1 Sitting Inactive in public: 0 As a passenger in car: 0   Lying down to rest: 1 Sitting and talking: 0 Sitting quielty after lunch: 0 In a car, stopped in traffic: 0 TOTAL SCORE:   2 out of 24      Current Medication: Outpatient Encounter Medications as of 02/16/2023  Medication Sig   atorvastatin (LIPITOR) 20 MG tablet Take 1 tablet (20 mg total) by mouth daily at 6 PM. At night   clopidogrel (PLAVIX) 75 MG tablet TAKE 1 TABLET BY MOUTH DAILY   co-enzyme Q-10 30 MG capsule Take 30 mg by mouth 3 (three) times daily.   glimepiride (AMARYL) 1 MG tablet Take 1 tablet (1 mg total) by mouth daily with breakfast.   glucose blood test strip E 11.9 qd Once Daily one touch   hydrochlorothiazide (HYDRODIURIL) 12.5 MG tablet Take 1 tablet (12.5 mg total) by mouth daily.   isosorbide mononitrate (IMDUR) 30 MG 24 hr tablet TAKE 1 TABLET BY MOUTH EVERY  MORNING AND TAKE TWO TABLETS BY MOUTH EVERY EVENING   JANUVIA 100 MG tablet Take 100 mg by mouth daily.   levothyroxine (SYNTHROID) 200 MCG tablet Take 1 tablet (200 mcg total) by mouth daily. In am on empty stomach   liothyronine (CYTOMEL) 5 MCG tablet Take 0.5 tablets (2.5 mcg total) by mouth every other day.   losartan (COZAAR) 50 MG tablet Take 1 tablet (50 mg total) by mouth in the morning and at bedtime. Stop ramipril 20 mg qd, d/c losartan 25 mg qd   Multiple Vitamins-Minerals (CENTRUM SILVER 50+MEN PO) Take 1 tablet by mouth every morning.    OneTouch Delica Lancets 33G MISC Check blood sugars twice weekly   pantoprazole (PROTONIX) 40 MG tablet Take 1 tablet (40 mg total) by mouth daily.   traMADol (ULTRAM) 50 MG tablet Take 50 mg by mouth 3 (three) times daily.   No facility-administered encounter medications on file as of 02/16/2023.    Surgical History: Past Surgical History:  Procedure Laterality Date   BIOPSY TONGUE     11/11/15 SCC tongue    cancer of tongue  08/2020   removal of cancer   CARDIAC CATHETERIZATION N/A 11/10/2015   Procedure: Left Heart Cath and Coronary Angiography;  Surgeon: Tony Ouch, MD;  Location: [facility] INVASIVE CV LAB;  Service: Cardiovascular;  Laterality: N/A;   COLONOSCOPY  05/2006  COLONOSCOPY WITH PROPOFOL N/A 05/24/2017   Procedure: COLONOSCOPY WITH PROPOFOL;  Surgeon: Tony Deem, MD;  Location: [facility] ENDOSCOPY;  Service: Endoscopy;  Laterality: N/A;   EXCISION OF TONGUE LESION N/A 11/11/2015   Procedure: EXCISION OF TONGUE LESION/ WITH FROZEN SECTION;  Surgeon: Tony Salmons, MD;  Location: [facility] ORS;  Service: ENT;  Laterality: N/A;   REPLACEMENT TOTAL KNEE Left    REPLACEMENT TOTAL KNEE Right 04/2020   [facility]   right tka minimally invasive 05/06/20 Dr. Laren Lawrence      Medical History: Past Medical History:  Diagnosis Date   Actinic keratoses    Actinic keratoses    Allergy    Aneurysm of  ascending aorta without rupture (HCC) 03/27/2022   Annual physical exam 02/27/2021   Aortic valve regurgitation 03/27/2022   Arthritis    low back pain s/p shots and blocks, knee pain   Arthritis of right knee 11/15/2019   BCC (basal cell carcinoma of skin)    forehead and chest Dr. 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   Chronic diarrhea 12/29/2021   Complication of anesthesia    SEVERE SORE THROAT AFTER BIOPSY 2010   Contusion of toenail 02/27/2021   Coronary artery disease    Chronic total occlusion of mid LAD   COVID-19    11/17/20   Diabetes mellitus (HCC) 03/12/2013   Diabetes mellitus without complication (HCC)    Diarrhea 03/21/2018   Erectile dysfunction due to arterial insufficiency 04/24/2019   Essential hypertension 12/22/2017   Flank pain 11/30/2013   GERD (gastroesophageal reflux disease)    H/O total knee replacement, 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   History of chicken pox    History of shingles    History of skin cancer 03/21/2018   History of tongue cancer 03/21/2018   HLD (hyperlipidemia) 03/28/2022   Hyperlipidemia    Hypertension    Hypothyroidism    Increased frequency of urination 03/12/2013   Malignant neoplasm of tongue, tip and lateral border (HCC) 11/04/2015   Mitral valve insufficiency 12/22/2019   Neuritis or radiculitis due to rupture of lumbar intervertebral disc 01/16/2014   Onychomycosis 02/27/2021   OSA on CPAP    Other intervertebral disc displacement, lumbar region 01/16/2014   Parapelvic renal cyst 12/18/2013   Primary osteoarthritis of both knees 05/13/2014   Primary osteoarthritis of left knee 11/22/2018   SCC (squamous cell carcinoma)    invasive left Post. lateral Tongue [facility] Tony Lawrence excised 08/25/20 surgical exc [facility] ?recurrent vs new primary    Slowing of urinary  stream 03/12/2013   Squamous cell carcinoma in situ    nose 2021    Squamous cell carcinoma in situ    nose 2021    Stroke (HCC)    Thrombocytopenia (HCC) 01/27/2022   Tongue cancer (HCC)    Squamous cell CA of tongue 11/11/15 no chemo or radiation ENT Dr. Jenne Campus, [facility] H/o    Tongue cancer [facility])    Squamous cell CA of tongue 11/11/15 no chemo or radiation ENT Dr. Jenne Campus, [facility] H/o    Urinary hesitancy 03/12/2013   Valvular heart disease 03/23/2021    Family History: Family History  Problem Relation Age of Onset   Myelodysplastic syndrome Mother    Arthritis Mother    Emphysema Father    Alcohol abuse Father  COPD Father    Depression Father    Early death Maternal Grandfather    Heart disease Maternal Grandfather    Stroke Maternal Grandfather     Social History   Socioeconomic History   Marital status: Married    Spouse name: Not on file   Number of children: Not on file   Years of education: Not on file   Highest education level: Bachelor's degree (e.g., BA, AB, BS)  Occupational History   Not on file  Tobacco Use   Smoking status: Never   Smokeless tobacco: Never  Vaping Use   Vaping status: Never Used  Substance and Sexual Activity   Alcohol use: Never    Alcohol/week: 0.0 standard drinks of alcohol   Drug use: No   Sexual activity: Yes  Other Topics Concern   Not on file  Social History Narrative   Married    3 sons    Engineer, maintenance (IT), former businessman in Designer, fashion/clothing retired    Owns guns, wears seat belt, safe in relationship   Social Determinants of Health   Financial Resource Strain: Low Risk  (02/10/2023)   Overall Financial Resource Strain (CARDIA)    Difficulty of Paying Living Expenses: Not very hard  Food Insecurity: No Food Insecurity (02/10/2023)   Hunger Vital Sign    Worried About Running Out of Food in the Last Year: Never true    Ran Out of Food in the Last Year: Never true  Transportation Needs: No Transportation Needs (02/10/2023)    PRAPARE - Administrator, Civil Service (Medical): No    Lack of Transportation (Non-Medical): No  Physical Activity: Insufficiently Active (02/10/2023)   Exercise Vital Sign    Days of Exercise per Week: 2 days    Minutes of Exercise per Session: 20 min  Stress: No Stress Concern Present (02/10/2023)   [employer] of Occupational Health - Occupational Stress Questionnaire    Feeling of Stress : Not at all  Social Connections: Socially Integrated (02/10/2023)   Social Connection and Isolation Panel [NHANES]    Frequency of Communication with Friends and Family: More than three times a week    Frequency of Social Gatherings with Friends and Family: Once a week    Attends Religious Services: More than 4 times per year    Active Member of [employer] or Organizations: Yes    Attends Engineer, structural: More than 4 times per year    Marital Status: Married  Catering manager Violence: Not At Risk (04/28/2022)   Humiliation, Afraid, Rape, and Kick questionnaire    Fear of Current or Ex-Partner: No    Emotionally Abused: No    Physically Abused: No    Sexually Abused: No      Review of Systems  Constitutional:  Negative for chills, fatigue and unexpected weight change.  HENT:  Negative for congestion, postnasal drip, rhinorrhea, sneezing and sore throat.   Respiratory: Negative.  Negative for cough, chest tightness and shortness of breath.   Cardiovascular: Negative.  Negative for chest pain and palpitations.  Gastrointestinal:  Negative for abdominal pain, constipation, diarrhea, nausea and vomiting.  Musculoskeletal:  Negative for arthralgias, back pain, joint swelling and neck pain.  Skin:  Negative for rash.  Neurological: Negative.   Psychiatric/Behavioral:  Negative for behavioral problems (Depression), sleep disturbance and suicidal ideas. The patient is not nervous/anxious.     Vital Signs: BP 124/70   Pulse 91   Temp 98.1 F (36.7 C)   Resp  16    Ht 5\' 10"  (1.778 m)   Wt 228 lb 6.4 oz (103.6 kg)   SpO2 97%   BMI 32.77 kg/m    Physical Exam Vitals reviewed.  Constitutional:      General: He is not in acute distress.    Appearance: Normal appearance. He is obese. He is not ill-appearing.  HENT:     Head: Normocephalic and atraumatic.  Eyes:     Pupils: Pupils are equal, round, and reactive to light.  Cardiovascular:     Rate and Rhythm: Normal rate and regular rhythm.  Pulmonary:     Effort: Pulmonary effort is normal. No respiratory distress.  Neurological:     Mental Status: He is alert and oriented to person, place, and time.  Psychiatric:        Mood and Affect: Mood normal.        Behavior: Behavior normal.        Assessment/Plan: 1. OSA on CPAP (Primary) Supplies ordered. Continue CPAP use as instructed. Follow up in 6 months  - For home use only DME continuous positive airway pressure (CPAP)  2. CPAP use counseling No issues with use or maintenance other than what was discussed about the face mask, supplies ordered. No other concerns regarding use or maintenance of CPAP machine - For home use only DME continuous positive airway pressure (CPAP)   General Counseling: Jaevion verbalizes understanding of the findings of todays visit and agrees with plan of treatment. I have discussed any further diagnostic evaluation that may be needed or ordered today. We also reviewed his medications today. he has been encouraged to call the office with any questions or concerns that should arise related to todays visit.    Orders Placed This Encounter  Procedures   For home use only DME continuous positive airway pressure (CPAP)    No orders of the defined types were placed in this encounter.   Return in about 6 months (around 08/17/2023) for F/U, pulmonary/sleep, Ayomide Zuleta or DSK.   Total time spent:20 Minutes Time spent includes review of chart, medications, test results, and follow up plan with the patient.   [place]  Controlled Substance Database was reviewed by me.  This patient was seen by Sallyanne Kuster, FNP-C in collaboration with Dr. Beverely Risen as a part of collaborative care agreement.   Keoni Risinger R. Tedd Sias, MSN, FNP-C Internal medicine

## 2023-02-16 NOTE — Assessment & Plan Note (Signed)
Patient is on Synthroid and Cytomel 5mg  (half tablet). TSH has not been checked recently. -Order TSH level today. -Consider discontinuing Cytomel in the future, pending discussion with patient and review of TSH levels.

## 2023-02-16 NOTE — Assessment & Plan Note (Signed)
A1c increased due to prednisone use, but has since improved (current A1c 6.1). Patient is monitoring blood glucose levels daily and is scheduled to see endocrinologist in two weeks. -Continue current management with Glimepiride 1mg  and Januvia 100mg  daily under the care of endocrinologist.

## 2023-02-18 ENCOUNTER — Other Ambulatory Visit: Payer: Self-pay | Admitting: Otolaryngology

## 2023-02-18 DIAGNOSIS — R591 Generalized enlarged lymph nodes: Secondary | ICD-10-CM

## 2023-02-22 ENCOUNTER — Telehealth: Payer: Self-pay | Admitting: Anesthesiology

## 2023-02-22 DIAGNOSIS — M5416 Radiculopathy, lumbar region: Secondary | ICD-10-CM | POA: Diagnosis not present

## 2023-02-22 DIAGNOSIS — R262 Difficulty in walking, not elsewhere classified: Secondary | ICD-10-CM | POA: Diagnosis not present

## 2023-02-22 NOTE — Telephone Encounter (Signed)
PT called stated that he needed a refill on his tramadol prescription. Please give patient a call. TY

## 2023-02-22 NOTE — Progress Notes (Signed)
Mir, Al Corpus, MD sent to [facility] PROCEDURE / BIOPSY REVIEW Date: 02/18/23  Requested Biopsy site: Left neck LN Reason for request: Prior hx of oral ca Imaging review: Best seen on US neck 02/15/2023  Decision: Approved Imaging modality to perform: Ultrasound Schedule with: No sedation / Local anesthetic Schedule for: Any VIR  Additional comments: @Schedulers . Maybe a core biopsy or FNA.  Please contact me with questions, concerns, or if issue pertaining to this request arise.  Al Corpus Mir, MD [facility]

## 2023-02-23 ENCOUNTER — Telehealth: Payer: Self-pay | Admitting: Anesthesiology

## 2023-02-23 ENCOUNTER — Other Ambulatory Visit: Payer: Self-pay

## 2023-02-23 NOTE — Telephone Encounter (Signed)
Patient had phone appt with Dr Pernell Dupre 02-01-23. Dr Pernell Dupre did not put in script for Tramadol to last until   Return in about 2 months (around 04/03/2023) for evaluation, med refill. After Visit Summary (Automatic SnapShot taken 02/02/2023  Please check with Dr Pernell Dupre and notify patient of status

## 2023-02-23 NOTE — Telephone Encounter (Signed)
Refill request sent to Dr Pernell Dupre

## 2023-02-25 NOTE — Progress Notes (Signed)
Patient for US guided Core/FNA LT LN Biopsy on Monday 02/28/2023, I called and spoke with the patient on the phone and gave pre-procedure instructions. Pt was made aware to be here at 12:30p and check in at the [facility] registration desk. Pt stated understanding.  Called 02/18/2023 and 02/25/2023

## 2023-02-28 ENCOUNTER — Telehealth: Payer: Self-pay | Admitting: Anesthesiology

## 2023-02-28 ENCOUNTER — Ambulatory Visit
Admission: RE | Admit: 2023-02-28 | Discharge: 2023-02-28 | Disposition: A | Discharge status: Home / Self Care | Payer: Medicare Other | Source: Ambulatory Visit | Attending: Otolaryngology | Admitting: Otolaryngology

## 2023-02-28 ENCOUNTER — Other Ambulatory Visit: Payer: Self-pay | Admitting: Internal Medicine

## 2023-02-28 ENCOUNTER — Other Ambulatory Visit: Payer: Self-pay | Admitting: *Deleted

## 2023-02-28 DIAGNOSIS — R591 Generalized enlarged lymph nodes: Secondary | ICD-10-CM

## 2023-02-28 DIAGNOSIS — Z8589 Personal history of malignant neoplasm of other organs and systems: Secondary | ICD-10-CM | POA: Diagnosis not present

## 2023-02-28 DIAGNOSIS — R590 Localized enlarged lymph nodes: Secondary | ICD-10-CM | POA: Insufficient documentation

## 2023-02-28 MED ORDER — LIDOCAINE HCL (PF) 1 % IJ SOLN
10.0000 mL | Freq: Once | INTRAMUSCULAR | Status: AC
  Administered 2023-02-28: 10 mL via INTRADERMAL
  Filled 2023-02-28: qty 10

## 2023-02-28 NOTE — Telephone Encounter (Signed)
Rx request sent to Dr. Cherylann Ratel, Dr. Pernell Dupre not available.

## 2023-02-28 NOTE — Telephone Encounter (Signed)
Patient notified per voicemail. 

## 2023-02-28 NOTE — Procedures (Signed)
Interventional Radiology Procedure Note  Procedure: US guided core biopsy of left submandibular LN.   Complications: None  Estimated Blood Loss: None  Recommendations: - DC home  Signed,  Criselda Peaches, MD

## 2023-02-28 NOTE — Telephone Encounter (Signed)
PT called left msg that the Tramadol prescription still hasn't been send in to his pharmacy. PT stated that he is out. Please give patient a call. TY

## 2023-03-01 ENCOUNTER — Ambulatory Visit: Payer: Medicare Other | Admitting: Neurosurgery

## 2023-03-01 DIAGNOSIS — R262 Difficulty in walking, not elsewhere classified: Secondary | ICD-10-CM | POA: Diagnosis not present

## 2023-03-01 DIAGNOSIS — M5416 Radiculopathy, lumbar region: Secondary | ICD-10-CM | POA: Diagnosis not present

## 2023-03-01 MED ORDER — TRAMADOL HCL 50 MG PO TABS: 50.0000 mg | ORAL_TABLET | Freq: Three times a day (TID) | ORAL | 0 refills | Status: DC

## 2023-03-02 LAB — SURGICAL PATHOLOGY

## 2023-03-02 NOTE — Telephone Encounter (Signed)
Patient informed. 

## 2023-03-04 ENCOUNTER — Other Ambulatory Visit: Payer: Self-pay | Admitting: Family Medicine

## 2023-03-04 ENCOUNTER — Inpatient Hospital Stay
Admission: RE | Admit: 2023-03-04 | Discharge: 2023-03-04 | Disposition: A | Discharge status: Home / Self Care | Payer: Self-pay | Source: Ambulatory Visit | Attending: Neurosurgery | Admitting: Neurosurgery

## 2023-03-04 ENCOUNTER — Telehealth: Payer: Self-pay | Admitting: Family Medicine

## 2023-03-04 ENCOUNTER — Telehealth: Payer: Self-pay | Admitting: Anesthesiology

## 2023-03-04 DIAGNOSIS — Z049 Encounter for examination and observation for unspecified reason: Secondary | ICD-10-CM

## 2023-03-04 NOTE — Telephone Encounter (Signed)
PT wants to know what Adams recommend for sleep. Please give patient a call. TY

## 2023-03-04 NOTE — Telephone Encounter (Signed)
Call to patient and informed him that Dr. Pernell Dupre is not in office today and recommended he call his PCP with his question.   Patient requesting appt to see Dr. Pernell Dupre. Sent message to clerical to give him a call and schedule.

## 2023-03-04 NOTE — Telephone Encounter (Signed)
Patient just called and he would like to see if he can get a sleep aid. He said it is hard for him to sleep. His number is [phone]. He would like for someone to call him.

## 2023-03-04 NOTE — Progress Notes (Unsigned)
Referring Physician:  Dana Allan, MD [address] [place],  [place] [address]  Primary Physician:  Dana Allan, MD  History of Present Illness: 03/04/2023 Tony Lawrence is here today with a chief complaint of ***  low back pain with radiation into the left greater than right flank, lower abdomen, groin, and testicles.   Duration: 5 years Location: *** Quality: dull ache with occasional sharp shooting pain Severity: 5/10  Precipitating: aggravated by walking, standing Modifying factors: made better by sitting Weakness: none Timing: constant Bowel/Bladder Dysfunction: none  Conservative measures:  Physical therapy: Has not participated in PT  Multimodal medical therapy including regular antiinflammatories: Tylenol, Gabapentin, Tramadol, Prednisone Injections: 10/06/2022: Right L3-4 transforaminal ESI 01/14/2020: Left L2-3 transforaminal ESI (good relief)  05/02/2018: Left L2-3 transforaminal ESI (moderate relief) 04/10/2018: Left L1-2 transforaminal ESI (minimal to no relief) 04/10/2015: Left L1-2 transforaminal ESI (improvement for back pain, little relief for radicular pain, performed in [place]) 01/18/2014: Right L1-2 transforaminal ESI (50% relief) 07/24/2013: Right L4-5 transforaminal ESI (right L4 radicular symptoms resolved)    Past Surgery: none  Tony Lawrence has ***no symptoms of cervical myelopathy.  The symptoms are causing a significant impact on the patient's life.   I have utilized the care everywhere function in epic to review the outside records available from external health systems.  Review of Systems:  A 10 point review of systems is negative, except for the pertinent positives and negatives detailed in the HPI.  Past Medical History: Past Medical History:  Diagnosis Date   Actinic keratoses    Actinic keratoses    Allergy    Aneurysm of ascending aorta without rupture (HCC) 03/27/2022   Annual physical exam 02/27/2021   Aortic  valve regurgitation 03/27/2022   Arthritis    low back pain s/p shots and blocks, knee pain   Arthritis of right knee 11/15/2019   BCC (basal cell carcinoma of skin)    forehead and chest Dr. 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) 11/04/2015   Chronic diarrhea 12/29/2021   Complication of anesthesia    SEVERE SORE THROAT AFTER BIOPSY 2010   Contusion of toenail 02/27/2021   Coronary artery disease    Chronic total occlusion of mid LAD   COVID-19    11/17/20   Diabetes mellitus (HCC) 03/12/2013   Diabetes mellitus without complication (HCC)    Diarrhea 03/21/2018   Erectile dysfunction due to arterial insufficiency 04/24/2019   Essential hypertension 12/22/2017   Flank pain 11/30/2013   GERD (gastroesophageal reflux disease)    H/O total knee replacement, 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   History of chicken pox    History of shingles    History of skin cancer 03/21/2018   History of tongue cancer 03/21/2018   HLD (hyperlipidemia) 03/28/2022   Hyperlipidemia    Hypertension    Hypothyroidism    Increased frequency of urination 03/12/2013   Malignant neoplasm of tongue, tip and lateral border (HCC) 11/04/2015   Mitral valve insufficiency 12/22/2019   Neuritis or radiculitis due to rupture of lumbar intervertebral disc 01/16/2014   Onychomycosis 02/27/2021   OSA on CPAP    Other intervertebral disc displacement, lumbar region 01/16/2014   Parapelvic renal cyst 12/18/2013   Primary osteoarthritis of both knees 05/13/2014   Primary osteoarthritis of left knee 11/22/2018   SCC (squamous  cell carcinoma)    invasive left Post. lateral Tongue [facility] Dr. Roma Schanz excised 08/25/20 surgical exc unc ?recurrent vs new primary    Slowing of urinary stream 03/12/2013   Squamous cell carcinoma in situ    nose 2021    Squamous cell carcinoma in  situ    nose 2021    Stroke (HCC)    Thrombocytopenia (HCC) 01/27/2022   Tongue cancer (HCC)    Squamous cell CA of tongue 11/11/15 no chemo or radiation ENT Dr. Jenne Campus, [facility] H/o    Tongue cancer [facility])    Squamous cell CA of tongue 11/11/15 no chemo or radiation ENT Dr. Jenne Campus, [facility] H/o    Urinary hesitancy 03/12/2013   Valvular heart disease 03/23/2021    Past Surgical History: Past Surgical History:  Procedure Laterality Date   BIOPSY TONGUE     11/11/15 SCC tongue    cancer of tongue  08/2020   removal of cancer   CARDIAC CATHETERIZATION N/A 11/10/2015   Procedure: Left Heart Cath and Coronary Angiography;  Surgeon: Iran Ouch, MD;  Location: [facility] INVASIVE CV LAB;  Service: Cardiovascular;  Laterality: N/A;   COLONOSCOPY  05/2006   COLONOSCOPY WITH PROPOFOL N/A 05/24/2017   Procedure: COLONOSCOPY WITH PROPOFOL;  Surgeon: Christena Deem, MD;  Location: [facility] ENDOSCOPY;  Service: Endoscopy;  Laterality: N/A;   EXCISION OF TONGUE LESION N/A 11/11/2015   Procedure: EXCISION OF TONGUE LESION/ WITH FROZEN SECTION;  Surgeon: Linus Salmons, MD;  Location: [facility] ORS;  Service: ENT;  Laterality: N/A;   REPLACEMENT TOTAL KNEE Left    REPLACEMENT TOTAL KNEE Right 04/2020   [facility]   right tka minimally invasive 05/06/20 Dr. Laren Everts      Allergies: Allergies as of 03/10/2023 - Review Complete 02/16/2023  Allergen Reaction Noted   Penicillins Hives, Rash, Other (See Comments), and Swelling 11/08/2012    Medications:  Current Outpatient Medications:    atorvastatin (LIPITOR) 20 MG tablet, Take 1 tablet (20 mg total) by mouth daily at 6 PM. At night, Disp: 90 tablet, Rfl: 3   clopidogrel (PLAVIX) 75 MG tablet, TAKE 1 TABLET BY MOUTH DAILY, Disp: 90 tablet, Rfl: 3   co-enzyme Q-10 30 MG capsule, Take 30 mg by mouth 3 (three) times daily., Disp: , Rfl:    glimepiride (AMARYL) 1 MG tablet, Take 1 tablet (1 mg total) by mouth daily with breakfast., Disp:  90 tablet, Rfl: 3   glucose blood test strip, E 11.9 qd Once Daily one touch, Disp: 100 each, Rfl: 12   hydrochlorothiazide (HYDRODIURIL) 12.5 MG tablet, Take 1 tablet (12.5 mg total) by mouth daily., Disp: 90 tablet, Rfl: 3   isosorbide mononitrate (IMDUR) 30 MG 24 hr tablet, TAKE 1 TABLET BY MOUTH EVERY MORNING AND TAKE TWO TABLETS BY MOUTH EVERY EVENING, Disp: 270 tablet, Rfl: 0   JANUVIA 100 MG tablet, Take 100 mg by mouth daily., Disp: , Rfl:    levothyroxine (SYNTHROID) 200 MCG tablet, Take 1 tablet (200 mcg total) by mouth daily. In am on empty stomach, Disp: 90 tablet, Rfl: 3   liothyronine (CYTOMEL) 5 MCG tablet, Take 0.5 tablets (2.5 mcg total) by mouth every other day., Disp: 45 tablet, Rfl: 3   losartan (COZAAR) 50 MG tablet, Take 1 tablet (50 mg total) by mouth in the morning and at bedtime. Stop ramipril 20 mg qd, d/c losartan 25 mg qd, Disp: 180 tablet, Rfl: 3   Multiple Vitamins-Minerals (CENTRUM SILVER 50+MEN PO), Take 1 tablet  by mouth every morning. , Disp: , Rfl:    OneTouch Delica Lancets 33G MISC, Check blood sugars twice weekly, Disp: 100 each, Rfl: 1   pantoprazole (PROTONIX) 40 MG tablet, Take 1 tablet (40 mg total) by mouth daily., Disp: 90 tablet, Rfl: 3   traMADol (ULTRAM) 50 MG tablet, Take 1 tablet (50 mg total) by mouth 3 (three) times daily., Disp: 30 tablet, Rfl: 0  Social History: Social History   Tobacco Use   Smoking status: Never   Smokeless tobacco: Never  Vaping Use   Vaping status: Never Used  Substance Use Topics   Alcohol use: Never    Alcohol/week: 0.0 standard drinks of alcohol   Drug use: No    Family Medical History: Family History  Problem Relation Age of Onset   Myelodysplastic syndrome Mother    Arthritis Mother    Emphysema Father    Alcohol abuse Father    COPD Father    Depression Father    Early death Maternal Grandfather    Heart disease Maternal Grandfather    Stroke Maternal Grandfather     Physical Examination: There  were no vitals filed for this visit.  General: Patient is in no apparent distress. Attention to examination is appropriate.  Neck:   Supple.  Full range of motion.  Respiratory: Patient is breathing without any difficulty.   NEUROLOGICAL:     Awake, alert, oriented to person, place, and time.  Speech is clear and fluent.   Cranial Nerves: Pupils equal round and reactive to light.  Facial tone is symmetric.  Facial sensation is symmetric. Shoulder shrug is symmetric. Tongue protrusion is midline.  There is no pronator drift.  Strength: Side Biceps Triceps Deltoid Interossei Grip Wrist Ext. Wrist Flex.  R 5 5 5 5 5 5 5   L 5 5 5 5 5 5 5    Side Iliopsoas Quads Hamstring PF DF EHL  R 5 5 5 5 5 5   L 5 5 5 5 5 5    Reflexes are ***2+ and symmetric at the biceps, triceps, brachioradialis, patella and achilles.   Hoffman's is absent.   Bilateral upper and lower extremity sensation is intact to light touch.    No evidence of dysmetria noted.  Gait is normal.     Medical Decision Making  Imaging: ***  I have personally reviewed the images and agree with the above interpretation.  Assessment and Plan: Tony Lawrence is a pleasant 77 y.o. male with ***    Thank you for involving me in the care of this patient.      Darwin Guastella K. Myer Haff MD, [facility]

## 2023-03-08 DIAGNOSIS — M5416 Radiculopathy, lumbar region: Secondary | ICD-10-CM | POA: Diagnosis not present

## 2023-03-08 DIAGNOSIS — R262 Difficulty in walking, not elsewhere classified: Secondary | ICD-10-CM | POA: Diagnosis not present

## 2023-03-08 MED ORDER — TRAMADOL HCL 50 MG PO TABS: 100.0000 mg | ORAL_TABLET | ORAL | 1 refills | Status: DC

## 2023-03-09 NOTE — Telephone Encounter (Signed)
Last read by Sharlet Salina at 10:52 AM on 03/09/2023.

## 2023-03-10 ENCOUNTER — Encounter: Payer: Self-pay | Admitting: Neurosurgery

## 2023-03-10 ENCOUNTER — Ambulatory Visit: Payer: Medicare Other | Admitting: Neurosurgery

## 2023-03-10 VITALS — BP 112/62 | Ht 70.0 in | Wt 228.0 lb

## 2023-03-10 DIAGNOSIS — M792 Neuralgia and neuritis, unspecified: Secondary | ICD-10-CM | POA: Diagnosis not present

## 2023-03-13 ENCOUNTER — Other Ambulatory Visit: Payer: Self-pay | Admitting: Internal Medicine

## 2023-03-14 NOTE — Telephone Encounter (Signed)
Please contact pt for future appointment. Pt overdue for  2 month f/u.

## 2023-03-14 NOTE — Telephone Encounter (Signed)
Patient was here in sept & AVS shows follow up due in 6months

## 2023-03-15 ENCOUNTER — Other Ambulatory Visit: Payer: Self-pay

## 2023-03-15 DIAGNOSIS — I152 Hypertension secondary to endocrine disorders: Secondary | ICD-10-CM

## 2023-03-17 ENCOUNTER — Other Ambulatory Visit: Payer: Self-pay

## 2023-03-17 DIAGNOSIS — E1159 Type 2 diabetes mellitus with other circulatory complications: Secondary | ICD-10-CM

## 2023-03-17 MED ORDER — LOSARTAN POTASSIUM 50 MG PO TABS: 50.0000 mg | ORAL_TABLET | Freq: Two times a day (BID) | ORAL | 3 refills | Status: DC

## 2023-03-21 DIAGNOSIS — H2512 Age-related nuclear cataract, left eye: Secondary | ICD-10-CM | POA: Diagnosis not present

## 2023-03-21 DIAGNOSIS — H2511 Age-related nuclear cataract, right eye: Secondary | ICD-10-CM | POA: Diagnosis not present

## 2023-03-21 DIAGNOSIS — E119 Type 2 diabetes mellitus without complications: Secondary | ICD-10-CM | POA: Diagnosis not present

## 2023-03-21 LAB — HM DIABETES EYE EXAM

## 2023-03-28 ENCOUNTER — Telehealth: Payer: Self-pay | Admitting: Internal Medicine

## 2023-03-28 DIAGNOSIS — E1159 Type 2 diabetes mellitus with other circulatory complications: Secondary | ICD-10-CM

## 2023-03-28 MED ORDER — LOSARTAN POTASSIUM 50 MG PO TABS: 25.0000 mg | ORAL_TABLET | Freq: Two times a day (BID) | ORAL | 3 refills | Status: DC

## 2023-03-28 NOTE — Telephone Encounter (Signed)
Pt c/o medication issue:  1. Name of Medication:   losartan (COZAAR) 50 MG tablet    2. How are you currently taking this medication (dosage and times per day)? Not currently taking   3. Are you having a reaction (difficulty breathing--STAT)? No   4. What is your medication issue? Patient is calling wanting to discuss why dosage was increased. Please advise.

## 2023-03-28 NOTE — Telephone Encounter (Signed)
Pt made aware and stated he will split the 50 mg in half to continue taking 25 mg BID.

## 2023-03-28 NOTE — Telephone Encounter (Signed)
Spoke with the patient, who stated that he was unaware he was supposed to increase his losartan to 50 mg BID until he recently picked up his new prescription from the pharmacy. The patient mentioned that he then reviewed his previous after-visit summary and saw that on 03/25/22, he was advised to increase the dosage. He stated that he has continued taking losartan 25 mg BID and noted that at his last few visits to his PCP, his systolic blood pressure ranged from 108-130. The patient is now questioning whether the doctor would like him to continue with 25 mg BID or increase to 50 mg BID.   Will forward to MD for review

## 2023-03-28 NOTE — Telephone Encounter (Signed)
Office visits from June and September both document that Mr. Olesh reported taking losartan 50 mg BID.  If he has instead been taking 25 mg BID, I recommend that he remain on the 25 mg BID regimen and keep monitoring his blood pressure.  I also recommend that he bring all of his medications with him to his office visits so that discrepancies between our medication list and what he is actually taking at home do not arise again.  Yvonne Kendall, MD [facility]

## 2023-03-30 DIAGNOSIS — K1370 Unspecified lesions of oral mucosa: Secondary | ICD-10-CM | POA: Diagnosis not present

## 2023-03-30 DIAGNOSIS — C021 Malignant neoplasm of border of tongue: Secondary | ICD-10-CM | POA: Diagnosis not present

## 2023-03-30 DIAGNOSIS — E1165 Type 2 diabetes mellitus with hyperglycemia: Secondary | ICD-10-CM | POA: Diagnosis not present

## 2023-03-30 DIAGNOSIS — K068 Other specified disorders of gingiva and edentulous alveolar ridge: Secondary | ICD-10-CM | POA: Diagnosis not present

## 2023-03-30 DIAGNOSIS — D0000 Carcinoma in situ of oral cavity, unspecified site: Secondary | ICD-10-CM | POA: Diagnosis not present

## 2023-03-30 DIAGNOSIS — C029 Malignant neoplasm of tongue, unspecified: Secondary | ICD-10-CM | POA: Diagnosis not present

## 2023-03-31 DIAGNOSIS — E1159 Type 2 diabetes mellitus with other circulatory complications: Secondary | ICD-10-CM | POA: Diagnosis not present

## 2023-03-31 DIAGNOSIS — E785 Hyperlipidemia, unspecified: Secondary | ICD-10-CM | POA: Diagnosis not present

## 2023-03-31 DIAGNOSIS — I152 Hypertension secondary to endocrine disorders: Secondary | ICD-10-CM | POA: Diagnosis not present

## 2023-03-31 DIAGNOSIS — R809 Proteinuria, unspecified: Secondary | ICD-10-CM | POA: Diagnosis not present

## 2023-03-31 DIAGNOSIS — E1169 Type 2 diabetes mellitus with other specified complication: Secondary | ICD-10-CM | POA: Diagnosis not present

## 2023-03-31 DIAGNOSIS — E1165 Type 2 diabetes mellitus with hyperglycemia: Secondary | ICD-10-CM | POA: Diagnosis not present

## 2023-03-31 DIAGNOSIS — E1129 Type 2 diabetes mellitus with other diabetic kidney complication: Secondary | ICD-10-CM | POA: Diagnosis not present

## 2023-04-08 ENCOUNTER — Other Ambulatory Visit: Payer: Self-pay | Admitting: Otolaryngology

## 2023-04-08 ENCOUNTER — Encounter: Payer: Self-pay | Admitting: Nurse Practitioner

## 2023-04-08 DIAGNOSIS — C029 Malignant neoplasm of tongue, unspecified: Secondary | ICD-10-CM

## 2023-04-08 DIAGNOSIS — D0000 Carcinoma in situ of oral cavity, unspecified site: Secondary | ICD-10-CM

## 2023-04-13 ENCOUNTER — Ambulatory Visit
Admission: RE | Admit: 2023-04-13 | Discharge: 2023-04-13 | Disposition: A | Discharge status: Home / Self Care | Payer: Medicare Other | Source: Ambulatory Visit | Attending: Otolaryngology | Admitting: Otolaryngology

## 2023-04-13 DIAGNOSIS — D0000 Carcinoma in situ of oral cavity, unspecified site: Secondary | ICD-10-CM | POA: Insufficient documentation

## 2023-04-13 DIAGNOSIS — C029 Malignant neoplasm of tongue, unspecified: Secondary | ICD-10-CM | POA: Diagnosis not present

## 2023-04-13 DIAGNOSIS — I6529 Occlusion and stenosis of unspecified carotid artery: Secondary | ICD-10-CM | POA: Diagnosis not present

## 2023-04-13 DIAGNOSIS — I672 Cerebral atherosclerosis: Secondary | ICD-10-CM | POA: Diagnosis not present

## 2023-04-13 MED ORDER — IOHEXOL 300 MG/ML  SOLN
75.0000 mL | Freq: Once | INTRAMUSCULAR | Status: AC | PRN
  Administered 2023-04-13: 75 mL via INTRAVENOUS

## 2023-04-14 DIAGNOSIS — C029 Malignant neoplasm of tongue, unspecified: Secondary | ICD-10-CM | POA: Diagnosis not present

## 2023-04-14 DIAGNOSIS — J432 Centrilobular emphysema: Secondary | ICD-10-CM | POA: Diagnosis not present

## 2023-04-15 DIAGNOSIS — D0000 Carcinoma in situ of oral cavity, unspecified site: Secondary | ICD-10-CM | POA: Diagnosis not present

## 2023-04-15 DIAGNOSIS — C029 Malignant neoplasm of tongue, unspecified: Secondary | ICD-10-CM | POA: Diagnosis not present

## 2023-04-27 HISTORY — PX: MOUTH SURGERY: SHX715

## 2023-05-02 ENCOUNTER — Telehealth: Payer: Self-pay | Admitting: Internal Medicine

## 2023-05-02 NOTE — Telephone Encounter (Signed)
   Pre-operative Risk Assessment    Patient Name: Tony Lawrence  DOB: May 20, 1945 MRN: [id number]   Date of last office visit: 01/21/23 Date of next office visit: n/a   Request for Surgical Clearance    Procedure:  Wide local excision with tooth extraction, either skin graft or buccal advancement to cover bone  Date of Surgery:  Clearance 05/10/23                                Surgeon:  Dr. Denys Socks Group or Practice Name:  [facility] Phone number:  [phone] Fax number:  [phone]   Type of Clearance Requested:   - Pharmacy:  Hold Clopidogrel  (Plavix )     Type of Anesthesia:  Not Indicated   Additional requests/questions:    Signed, Arsenio Celine GAILS   05/02/2023, 2:56 PM

## 2023-05-02 NOTE — Telephone Encounter (Signed)
 Spoke with surgeon's office and were informed that a message has been sent to Dr. Timoteo Ace nurse to provide further information regarding anesthesia type. Nurse will give a call to provide requested information.

## 2023-05-03 ENCOUNTER — Telehealth: Payer: Self-pay | Admitting: *Deleted

## 2023-05-03 NOTE — Telephone Encounter (Signed)
 Pt has been scheduled for a tele visit 05/05/23 3:40, consent on file / medications reconciled.    Patient Consent for Virtual Visit        Tony Lawrence has provided verbal consent on 05/03/2023 for a virtual visit (video or telephone).   CONSENT FOR VIRTUAL VISIT FOR:  Tony Lawrence  By participating in this virtual visit I agree to the following:  I hereby voluntarily request, consent and authorize [facility] and its employed or contracted physicians, physician assistants, nurse practitioners or other licensed health care professionals (the Practitioner), to provide me with telemedicine health care services (the "Services) as deemed necessary by the treating Practitioner. I acknowledge and consent to receive the Services by the Practitioner via telemedicine. I understand that the telemedicine visit will involve communicating with the Practitioner through live audiovisual communication technology and the disclosure of certain medical information by electronic transmission. I acknowledge that I have been given the opportunity to request an in-person assessment or other available alternative prior to the telemedicine visit and am voluntarily participating in the telemedicine visit.  I understand that I have the right to withhold or withdraw my consent to the use of telemedicine in the course of my care at any time, without affecting my right to future care or treatment, and that the Practitioner or I may terminate the telemedicine visit at any time. I understand that I have the right to inspect all information obtained and/or recorded in the course of the telemedicine visit and may receive copies of available information for a reasonable fee.  I understand that some of the potential risks of receiving the Services via telemedicine include:  Delay or interruption in medical evaluation due to technological equipment failure or disruption; Information transmitted may not be sufficient  (e.g. poor resolution of images) to allow for appropriate medical decision making by the Practitioner; and/or  In rare instances, security protocols could fail, causing a breach of personal health information.  Furthermore, I acknowledge that it is my responsibility to provide information about my medical history, conditions and care that is complete and accurate to the best of my ability. I acknowledge that Practitioner's advice, recommendations, and/or decision may be based on factors not within their control, such as incomplete or inaccurate data provided by me or distortions of diagnostic images or specimens that may result from electronic transmissions. I understand that the practice of medicine is not an exact science and that Practitioner makes no warranties or guarantees regarding treatment outcomes. I acknowledge that a copy of this consent can be made available to me via my patient portal [facility] MyChart), or I can request a printed copy by calling the office of [facility].    I understand that my insurance will be billed for this visit.   I have read or had this consent read to me. I understand the contents of this consent, which adequately explains the benefits and risks of the Services being provided via telemedicine.  I have been provided ample opportunity to ask questions regarding this consent and the Services and have had my questions answered to my satisfaction. I give my informed consent for the services to be provided through the use of telemedicine in my medical care

## 2023-05-03 NOTE — Telephone Encounter (Signed)
 Caller Morrie Sheldon) called to report patient will have general anesthesia in the OR.

## 2023-05-03 NOTE — Telephone Encounter (Signed)
 Primary Cardiologist:Christopher End, MD   Preoperative team, please contact this patient and set up a phone call appointment for further preoperative risk assessment. Please obtain consent and complete medication review. Thank you for your help.   Per Dr. Mady, unless Mr. [facility] dentist feels that he is at high risk for bleeding complications, I would recommend continuation of clopidogrel  75 mg daily throughout the periprocedural period.   I also confirmed the patient resides in the state of [place] . As per [place] [facility] telemedicine laws, the patient must reside in the state in which the provider is licensed.   Rosaline EMERSON Bane, NP-C  05/03/2023, 4:09 PM [address], [address] Office [phone] Fax [phone]

## 2023-05-03 NOTE — Telephone Encounter (Signed)
 Pt has been scheduled for a tele visit 05/05/23 3:40, consent on file / medications reconciled.

## 2023-05-05 ENCOUNTER — Ambulatory Visit: Payer: Medicare Other | Attending: Cardiology | Admitting: Student

## 2023-05-05 DIAGNOSIS — Z01810 Encounter for preprocedural cardiovascular examination: Secondary | ICD-10-CM | POA: Diagnosis not present

## 2023-05-05 NOTE — Progress Notes (Signed)
 Virtual Visit via Telephone Note   Because of Tony Lawrence's co-morbid illnesses, he is at least at moderate risk for complications without adequate follow up.  This format is felt to be most appropriate for this patient at this time.  The patient did not have access to video technology/had technical difficulties with video requiring transitioning to audio format only (telephone).  All issues noted in this document were discussed and addressed.  No physical exam could be performed with this format.  Please refer to the patient's chart for his consent to telehealth for [facility].  Evaluation Performed:  Preoperative cardiovascular risk assessment _____________   Date:  05/05/2023   Patient ID:  Tony Lawrence, DOB 1945-06-12, MRN [id number] Patient Location:  Home Provider location:   Office  Primary Care Provider:  Hope Merle, MD Primary Cardiologist:  Lonni Hanson, MD  Chief Complaint / Patient Profile   78 y.o. y/o male with a h/o CAD with CTO of mid LAD, chronic diastolic heart failure, hypertension, hyperlipidemia, TIA, DVT, OSA on CPAP, tongue cancer, hypothyroidism, T2DM who is pending wide local excision with tooth extraction, either skin graft or buccal advancement to cover bone by Dr. Denys on 05/10/2023 and presents today for telephonic preoperative cardiovascular risk assessment.  History of Present Illness    Tony Lawrence is a 78 y.o. male who presents via audio/video conferencing for a telehealth visit today.  Pt was last seen in cardiology clinic on 01/21/2023 by Katlyn West, NP.  At that time Tony Lawrence was stable from a cardiac standpoint.  The patient is now pending procedure as outlined above. Since his last visit, he is doing well. Patient denies shortness of breath, dyspnea on exertion, lower extremity edema, orthopnea or PND. No chest pain, pressure, or tightness. No palpitations. He stays active doing back stretches and the stationary  bicycle for 15 minutes every other day.   Past Medical History    Past Medical History:  Diagnosis Date   Actinic keratoses    Actinic keratoses    Allergy    Aneurysm of ascending aorta without rupture (HCC) 03/27/2022   Annual physical exam 02/27/2021   Aortic valve regurgitation 03/27/2022   Arthritis    low back pain s/p shots and blocks, knee pain   Arthritis of right knee 11/15/2019   BCC (basal cell carcinoma of skin)    forehead and chest Dr. 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) 11/04/2015   Chronic diarrhea 12/29/2021   Complication of anesthesia    SEVERE SORE THROAT AFTER BIOPSY 2010   Contusion of toenail 02/27/2021   Coronary artery disease    Chronic total occlusion of mid LAD   COVID-19    11/17/20   Diabetes mellitus (HCC) 03/12/2013   Diabetes mellitus without complication (HCC)    Diarrhea 03/21/2018   Erectile dysfunction due to arterial insufficiency 04/24/2019   Essential hypertension 12/22/2017   Flank pain 11/30/2013   GERD (gastroesophageal reflux disease)    H/O total knee replacement, 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   History of chicken pox    History of shingles    History of skin cancer 03/21/2018   History of tongue cancer 03/21/2018   HLD (hyperlipidemia) 03/28/2022   Hyperlipidemia    Hypertension    Hypothyroidism    Increased frequency of  urination 03/12/2013   Malignant neoplasm of tongue, tip and lateral border (HCC) 11/04/2015   Mitral valve insufficiency 12/22/2019   Neuritis or radiculitis due to rupture of lumbar intervertebral disc 01/16/2014   Onychomycosis 02/27/2021   OSA on CPAP    Other intervertebral disc displacement, lumbar region 01/16/2014   Parapelvic renal cyst 12/18/2013   Primary osteoarthritis of both knees 05/13/2014   Primary osteoarthritis of left knee  11/22/2018   SCC (squamous cell carcinoma)    invasive left Post. lateral Tongue [facility] Dr. Denys excised 08/25/20 surgical exc unc ?recurrent vs new primary    Slowing of urinary stream 03/12/2013   Squamous cell carcinoma in situ    nose 2021    Squamous cell carcinoma in situ    nose 2021    Stroke (HCC)    Thrombocytopenia (HCC) 01/27/2022   Tongue cancer (HCC)    Squamous cell CA of tongue 11/11/15 no chemo or radiation ENT Dr. Herminio, [facility] H/o    Tongue cancer [facility])    Squamous cell CA of tongue 11/11/15 no chemo or radiation ENT Dr. Herminio, [facility] H/o    Urinary hesitancy 03/12/2013   Valvular heart disease 03/23/2021   Past Surgical History:  Procedure Laterality Date   BIOPSY TONGUE     11/11/15 SCC tongue    cancer of tongue  08/2020   removal of cancer   CARDIAC CATHETERIZATION N/A 11/10/2015   Procedure: Left Heart Cath and Coronary Angiography;  Surgeon: Deatrice DELENA Cage, MD;  Location: [facility] INVASIVE CV LAB;  Service: Cardiovascular;  Laterality: N/A;   COLONOSCOPY  05/2006   COLONOSCOPY WITH PROPOFOL  N/A 05/24/2017   Procedure: COLONOSCOPY WITH PROPOFOL ;  Surgeon: Gaylyn Gladis PENNER, MD;  Location: [facility] ENDOSCOPY;  Service: Endoscopy;  Laterality: N/A;   EXCISION OF TONGUE LESION N/A 11/11/2015   Procedure: EXCISION OF TONGUE LESION/ WITH FROZEN SECTION;  Surgeon: Chinita Herminio, MD;  Location: [facility] ORS;  Service: ENT;  Laterality: N/A;   REPLACEMENT TOTAL KNEE Left    REPLACEMENT TOTAL KNEE Right 04/2020   [facility]   right tka minimally invasive 05/06/20 Dr. Joyce Grater      Allergies  Allergies  Allergen Reactions   Penicillins Hives, Rash, Other (See Comments) and Swelling    Has patient had a PCN reaction causing immediate rash, facial/tongue/throat swelling, SOB or lightheadedness with hypotension: Yes Has patient had a PCN reaction causing severe rash involving mucus membranes or skin necrosis: No Has patient had a PCN reaction that  required hospitalization No Has patient had a PCN reaction occurring within the last 10 years: No If all of the above answers are NO, then may proceed with Cephalosporin use.     Home Medications    Prior to Admission medications   Medication Sig Start Date End Date Taking? Authorizing Provider  atorvastatin  (LIPITOR) 20 MG tablet Take 1 tablet (20 mg total) by mouth daily at 6 PM. At night 10/22/22   Hope Merle, MD  clopidogrel  (PLAVIX ) 75 MG tablet TAKE 1 TABLET BY MOUTH DAILY 02/28/23   End, Lonni, MD  co-enzyme Q-10 30 MG capsule Take 30 mg by mouth 3 (three) times daily.    [provider]  glimepiride  (AMARYL ) 1 MG tablet Take 1 tablet (1 mg total) by mouth daily with breakfast. 10/22/22   Hope Merle, MD  glucose blood test strip E 11.9 qd Once Daily one touch 09/25/21   McLean-Scocuzza, Randine SAILOR, MD  hydrochlorothiazide  (HYDRODIURIL ) 12.5 MG tablet Take  1 tablet (12.5 mg total) by mouth daily. 11/23/22 05/03/23  Darron Deatrice LABOR, MD  isosorbide  mononitrate (IMDUR ) 30 MG 24 hr tablet TAKE 1 TABLET BY MOUTH EVERY MORNING AND TAKE TWO TABLETS BY MOUTH EVERY EVENING 03/14/23   End, Lonni, MD  JANUVIA 100 MG tablet Take 100 mg by mouth daily. 11/22/22   [provider]  levothyroxine  (SYNTHROID ) 200 MCG tablet Take 1 tablet (200 mcg total) by mouth daily. In am on empty stomach 12/13/22   Hope Merle, MD  liothyronine  (CYTOMEL ) 5 MCG tablet Take 0.5 tablets (2.5 mcg total) by mouth every other day. 11/29/22   Hope Merle, MD  losartan  (COZAAR ) 50 MG tablet Take 0.5 tablets (25 mg total) by mouth in the morning and at bedtime. 03/28/23   End, Lonni, MD  Multiple Vitamins-Minerals (CENTRUM SILVER 50+MEN PO) Take 1 tablet by mouth every morning.     [provider]  OneTouch Delica Lancets 33G MISC Check blood sugars twice weekly 04/06/22   Hope Merle, MD  pantoprazole  (PROTONIX ) 40 MG tablet Take 1 tablet (40 mg total) by mouth daily. 12/17/22   Hope Merle, MD    Physical Exam    Vital Signs:  Tony Lawrence does not have vital signs available for review today.  Given telephonic nature of communication, physical exam is limited. AAOx3. NAD. Normal affect.  Speech and respirations are unlabored.   Assessment & Plan    Primary Cardiologist: Lonni Hanson, MD  Preoperative cardiovascular risk assessment.  Wide local excision with tooth extraction, either skin graft or buccal advancement to cover bone by Dr. Denys on 05/10/2023.  Chart reviewed as part of pre-operative protocol coverage. According to the RCRI, patient has a 6.6% risk of MACE. Patient reports activity equivalent to 4.0 METS (back stretches and stationary bicycle for 15 minutes every other day).   Given past medical history and time since last visit, based on ACC/AHA guidelines, Tony Lawrence would be at acceptable risk for the planned procedure without further cardiovascular testing.   Patient was advised that if he develops new symptoms prior to surgery to contact our office to arrange a follow-up appointment.  he verbalized understanding.  Per Dr. Hanson, unless Tony Lawrence dentist feels that he is at high risk for bleeding complications, I would recommend continuation of clopidogrel  75 mg daily throughout the periprocedural period.  If necessary Plavix  may be held for 5 days prior to procedure and should resume as soon as hemodynamically stable postoperatively.   I will route this recommendation to the requesting party via Epic fax function.  Please call with questions.  Time:   Today, I have spent 6 minutes with the patient with telehealth technology discussing medical history, symptoms, and management plan.     Barnie Hila, NP  05/05/2023, 10:31 AM

## 2023-05-17 ENCOUNTER — Ambulatory Visit: Payer: Medicare Other | Admitting: *Deleted

## 2023-05-17 VITALS — Ht 70.0 in | Wt 230.0 lb

## 2023-05-17 DIAGNOSIS — Z0000 Encounter for general adult medical examination without abnormal findings: Secondary | ICD-10-CM

## 2023-05-17 NOTE — Patient Instructions (Signed)
Tony Lawrence , Thank you for taking time to come for your Medicare Wellness Visit. I appreciate your ongoing commitment to your health goals. Please review the following plan we discussed and let me know if I can assist you in the future.   Referrals/Orders/Follow-Ups/Clinician Recommendations: None  This is a list of the screening recommended for you and due dates:  Health Maintenance  Topic Date Due   COVID-19 Vaccine (4 - 2024-25 season) 12/26/2022   Complete foot exam   07/14/2023   Hemoglobin A1C  08/15/2023   Yearly kidney function blood test for diabetes  02/14/2024   Yearly kidney health urinalysis for diabetes  02/14/2024   Eye exam for diabetics  03/20/2024   Medicare Annual Wellness Visit  05/16/2024   DTaP/Tdap/Td vaccine (2 - Td or Tdap) 08/01/2028   Pneumonia Vaccine  Completed   Flu Shot  Completed   Hepatitis C Screening  Completed   Zoster (Shingles) Vaccine  Completed   HPV Vaccine  Aged Out   Colon Cancer Screening  Discontinued    Advanced directives: (Declined) Advance directive discussed with you today. Even though you declined this today, please call our office should you change your mind, and we can give you the proper paperwork for you to fill out.  Next Medicare Annual Wellness Visit scheduled for next year: Yes 05/22/24 @ 11:30

## 2023-05-17 NOTE — Progress Notes (Signed)
Subjective:   Tony Lawrence is a 78 y.o. male who presents for Medicare Annual/Subsequent preventive examination.  Visit Complete: Virtual I connected with  Tony Lawrence on 05/17/23 by a audio enabled telemedicine application and verified that I am speaking with the correct person using two identifiers. This patient declined Interactive audio and Acupuncturist. Therefore the visit was completed with audio only.   Patient Location: Home  Provider Location: Office/Clinic  I discussed the limitations of evaluation and management by telemedicine. The patient expressed understanding and agreed to proceed.  Vital Signs: Because this visit was a virtual/telehealth visit, some criteria may be missing or patient reported. Any vitals not documented were not able to be obtained and vitals that have been documented are patient reported.  .  Cardiac Risk Factors include: advanced age (>39men, >37 women);diabetes mellitus;dyslipidemia;hypertension;male gender;obesity (BMI >30kg/m2)     Objective:    Today's Vitals   05/17/23 1443  Weight: 230 lb (104.3 kg)  Height: 5\' 10"  (1.778 m)   Body mass index is 33 kg/m.     05/17/2023    2:57 PM 12/28/2022    1:03 PM 08/09/2022   11:53 AM 04/28/2022    2:08 PM 03/29/2022   11:36 AM 02/02/2022   10:54 AM 02/20/2021    1:29 PM  Advanced Directives  Does Patient Have a Medical Advance Directive? Yes Yes Yes Yes Yes Yes Yes  Type of Estate agent of [place];Living will Healthcare Power of [place];Living will Healthcare Power of [place];Living will Healthcare Power of [place];Living will Healthcare Power of [place];Living will Healthcare Power of [place];Living will Healthcare Power of [place];Living will  Does patient want to make changes to medical advance directive? No - Patient declined    No - Patient declined    Copy of Healthcare Power of Attorney in Chart? Yes - validated most recent copy scanned in chart  (See row information) No - copy requested  No - copy requested   Yes - validated most recent copy scanned in chart (See row information)    Current Medications (verified) Outpatient Encounter Medications as of 05/17/2023  Medication Sig   acetaminophen (TYLENOL) 500 MG tablet Take 500 mg by mouth every 6 (six) hours as needed.   atorvastatin (LIPITOR) 20 MG tablet Take 1 tablet (20 mg total) by mouth daily at 6 PM. At night   clopidogrel (PLAVIX) 75 MG tablet TAKE 1 TABLET BY MOUTH DAILY   co-enzyme Q-10 30 MG capsule Take 30 mg by mouth 3 (three) times daily.   glimepiride (AMARYL) 1 MG tablet Take 1 tablet (1 mg total) by mouth daily with breakfast.   glucose blood test strip E 11.9 qd Once Daily one touch   isosorbide mononitrate (IMDUR) 30 MG 24 hr tablet TAKE 1 TABLET BY MOUTH EVERY MORNING AND TAKE TWO TABLETS BY MOUTH EVERY EVENING   JANUVIA 100 MG tablet Take 100 mg by mouth daily.   levothyroxine (SYNTHROID) 200 MCG tablet Take 1 tablet (200 mcg total) by mouth daily. In am on empty stomach   liothyronine (CYTOMEL) 5 MCG tablet Take 0.5 tablets (2.5 mcg total) by mouth every other day.   losartan (COZAAR) 50 MG tablet Take 0.5 tablets (25 mg total) by mouth in the morning and at bedtime.   Multiple Vitamins-Minerals (CENTRUM SILVER 50+MEN PO) Take 1 tablet by mouth every morning.    OneTouch Delica Lancets 33G MISC Check blood sugars twice weekly   pantoprazole (PROTONIX) 40 MG tablet Take 1  tablet (40 mg total) by mouth daily.   hydrochlorothiazide (HYDRODIURIL) 12.5 MG tablet Take 1 tablet (12.5 mg total) by mouth daily.   No facility-administered encounter medications on file as of 05/17/2023.    Allergies (verified) Penicillins   History: Past Medical History:  Diagnosis Date   Actinic keratoses    Actinic keratoses    Allergy    Aneurysm of ascending aorta without rupture (HCC) 03/27/2022   Annual physical exam 02/27/2021   Aortic valve regurgitation 03/27/2022    Arthritis    low back pain s/p shots and blocks, knee pain   Arthritis of right knee 11/15/2019   BCC (basal cell carcinoma of skin)    forehead and chest Dr. 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) 11/04/2015   Chronic diarrhea 12/29/2021   Complication of anesthesia    SEVERE SORE THROAT AFTER BIOPSY 2010   Contusion of toenail 02/27/2021   Coronary artery disease    Chronic total occlusion of mid LAD   COVID-19    11/17/20   Diabetes mellitus (HCC) 03/12/2013   Diabetes mellitus without complication (HCC)    Diarrhea 03/21/2018   Erectile dysfunction due to arterial insufficiency 04/24/2019   Essential hypertension 12/22/2017   Flank pain 11/30/2013   GERD (gastroesophageal reflux disease)    H/O total knee replacement, 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   History of chicken pox    History of shingles    History of skin cancer 03/21/2018   History of tongue cancer 03/21/2018   HLD (hyperlipidemia) 03/28/2022   Hyperlipidemia    Hypertension    Hypothyroidism    Increased frequency of urination 03/12/2013   Malignant neoplasm of tongue, tip and lateral border (HCC) 11/04/2015   Mitral valve insufficiency 12/22/2019   Neuritis or radiculitis due to rupture of lumbar intervertebral disc 01/16/2014   Onychomycosis 02/27/2021   OSA on CPAP    Other intervertebral disc displacement, lumbar region 01/16/2014   Parapelvic renal cyst 12/18/2013   Primary osteoarthritis of both knees 05/13/2014   Primary osteoarthritis of left knee 11/22/2018   SCC (squamous cell carcinoma)    invasive left Post. lateral Tongue [facility] Dr. Roma Schanz excised 08/25/20 surgical exc unc ?recurrent vs new primary    Slowing of urinary stream 03/12/2013   Squamous cell carcinoma in situ    nose 2021    Squamous cell carcinoma in situ    nose 2021    Stroke  (HCC)    Thrombocytopenia (HCC) 01/27/2022   Tongue cancer (HCC)    Squamous cell CA of tongue 11/11/15 no chemo or radiation ENT Dr. Jenne Campus, [facility] H/o    Tongue cancer [facility])    Squamous cell CA of tongue 11/11/15 no chemo or radiation ENT Dr. Jenne Campus, [facility] H/o    Urinary hesitancy 03/12/2013   Valvular heart disease 03/23/2021   Past Surgical History:  Procedure Laterality Date   BIOPSY TONGUE     11/11/15 SCC tongue    cancer of tongue  08/2020   removal of cancer   CARDIAC CATHETERIZATION N/A 11/10/2015   Procedure: Left Heart Cath and Coronary Angiography;  Surgeon: Iran Ouch, MD;  Location: [facility] INVASIVE CV LAB;  Service: Cardiovascular;  Laterality: N/A;   COLONOSCOPY  05/2006   COLONOSCOPY WITH PROPOFOL N/A 05/24/2017   Procedure: COLONOSCOPY WITH PROPOFOL;  Surgeon: Barnetta Chapel  U, MD;  Location: [facility] ENDOSCOPY;  Service: Endoscopy;  Laterality: N/A;   EXCISION OF TONGUE LESION N/A 11/11/2015   Procedure: EXCISION OF TONGUE LESION/ WITH FROZEN SECTION;  Surgeon: Linus Salmons, MD;  Location: [facility] ORS;  Service: ENT;  Laterality: N/A;   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 Laren Everts     Family History  Problem Relation Age of Onset   Myelodysplastic syndrome Mother    Arthritis Mother    Emphysema Father    Alcohol abuse Father    COPD Father    Depression Father    Early death Maternal Grandfather    Heart disease Maternal Grandfather    Stroke Maternal Grandfather    Social History   Socioeconomic History   Marital status: Married    Spouse name: Not on file   Number of children: Not on file   Years of education: Not on file   Highest education level: Bachelor's degree (e.g., BA, AB, BS)  Occupational History   Not on file  Tobacco Use   Smoking status: Never   Smokeless tobacco: Never  Vaping Use    Vaping status: Never Used  Substance and Sexual Activity   Alcohol use: Never    Alcohol/week: 0.0 standard drinks of alcohol   Drug use: No   Sexual activity: Yes  Other Topics Concern   Not on file  Social History Narrative   Married    3 sons    Engineer, maintenance (IT), former businessman in Designer, fashion/clothing retired    Owns guns, wears seat belt, safe in relationship   Social Drivers of Corporate investment banker Strain: Low Risk  (05/17/2023)   Overall Financial Resource Strain (CARDIA)    Difficulty of Paying Living Expenses: Not hard at all  Food Insecurity: No Food Insecurity (05/17/2023)   Hunger Vital Sign    Worried About Running Out of Food in the Last Year: Never true    Ran Out of Food in the Last Year: Never true  Transportation Needs: No Transportation Needs (05/17/2023)   PRAPARE - Administrator, Civil Service (Medical): No    Lack of Transportation (Non-Medical): No  Physical Activity: Insufficiently Active (05/17/2023)   Exercise Vital Sign    Days of Exercise per Week: 3 days    Minutes of Exercise per Session: 30 min  Stress: No Stress Concern Present (05/17/2023)   [employer] of Occupational Health - Occupational Stress Questionnaire    Feeling of Stress : Only a little  Social Connections: Moderately Integrated (05/17/2023)   Social Connection and Isolation Panel [NHANES]    Frequency of Communication with Friends and Family: More than three times a week    Frequency of Social Gatherings with Friends and Family: More than three times a week    Attends Religious Services: More than 4 times per year    Active Member of [employer] or Organizations: No    Attends Engineer, structural: Never    Marital Status: Married    Tobacco Counseling Counseling given: Not Answered   Clinical Intake:  Pre-visit preparation completed: Yes  Pain : No/denies pain     BMI - recorded: 33 Nutritional Status: BMI > 30  Obese Nutritional Risks:  None Diabetes: Yes CBG done?: No Did pt. bring in CBG monitor from home?: No  How often do you need to have someone help you when you read instructions, pamphlets, or other written materials from your doctor or pharmacy?: 1 - Never  Interpreter Needed?: No  Information entered by :: R. Terris Germano LPN   Activities of Daily Living    05/17/2023    2:44 PM  In your present state of health, do you have any difficulty performing the following activities:  Hearing? 0  Vision? 0  Comment readers  Difficulty concentrating or making decisions? 0  Walking or climbing stairs? 0  Dressing or bathing? 0  Doing errands, shopping? 0  Preparing Food and eating ? N  Using the Toilet? N  In the past six months, have you accidently leaked urine? N  Do you have problems with loss of bowel control? N  Managing your Medications? N  Managing your Finances? N  Housekeeping or managing your Housekeeping? N    Patient Care Team: Dana Allan, MD as PCP - General (Family Medicine) End, Cristal Deer, MD as PCP - Cardiology (Cardiology)  Indicate any recent Medical Services you may have received from other than [facility] providers in the past year (date may be approximate).     Assessment:   This is a routine wellness examination for Tony Lawrence.  Hearing/Vision screen Hearing Screening - Comments:: No issues Vision Screening - Comments:: readers   Goals Addressed             This Visit's Progress    Patient Stated       Wants to exercise more       Depression Screen    05/17/2023    2:52 PM 02/14/2023   11:41 AM 12/28/2022    1:03 PM 12/10/2022   11:04 AM 07/14/2022    9:57 AM 04/28/2022    2:07 PM 04/06/2022    2:47 PM  PHQ 2/9 Scores  PHQ - 2 Score 0 0 0 2 0 0 0  PHQ- 9 Score 0 0  6       Fall Risk    05/17/2023    2:46 PM 02/14/2023   11:40 AM 12/28/2022    1:03 PM 12/10/2022   11:04 AM 07/14/2022    9:57 AM  Fall Risk   Falls in the past year? 1 1 1 1  0  Number falls in past yr: 1 1 1 1   0  Injury with Fall? 0 0 0 0 0  Risk for fall due to : History of fall(s);Impaired mobility  History of fall(s);Impaired balance/gait No Fall Risks No Fall Risks  Risk for fall due to: Comment weakness in leg      Follow up Falls evaluation completed;Falls prevention discussed Falls evaluation completed  Education provided;Falls evaluation completed Falls evaluation completed    MEDICARE RISK AT HOME: Medicare Risk at Home Any stairs in or around the home?: Yes If so, are there any without handrails?: No Home free of loose throw rugs in walkways, pet beds, electrical cords, etc?: Yes Adequate lighting in your home to reduce risk of falls?: Yes Life alert?: No Use of a cane, walker or w/c?: No Grab bars in the bathroom?: No Shower chair or bench in shower?: Yes Elevated toilet seat or a handicapped toilet?: Yes   Cognitive Function:    01/11/2018   10:20 AM  MMSE - Mini Mental State Exam  Orientation to time 5  Orientation to Place 5  Registration 3  Attention/ Calculation 5  Recall 3  Language- name 2 objects 2  Language- repeat  1  Language- follow 3 step command 3  Language- read & follow direction 1  Write a sentence 1  Copy design 1  Total score 30        05/17/2023    2:57 PM 04/28/2022    2:08 PM 02/20/2021    1:33 PM 01/29/2019   12:24 PM  6CIT Screen  What Year?  0 points 0 points 0 points  What month? 0 points 0 points 0 points 0 points  What time? 0 points 0 points 0 points 0 points  Count back from 20 0 points 0 points 0 points 0 points  Months in reverse 0 points 0 points 0 points 0 points  Repeat phrase 0 points 0 points 0 points 0 points  Total Score  0 points 0 points 0 points    Immunizations Immunization History  Administered Date(s) Administered   Influenza Inj Mdck Quad Pf 01/11/2018   Influenza, High Dose Seasonal PF 03/25/2020, 01/19/2021   Influenza,inj,quad, With Preservative 01/11/2018   Influenza-Unspecified 02/10/2016, 01/25/2019,  02/23/2019, 01/20/2023   PFIZER(Purple Top)SARS-COV-2 Vaccination 06/09/2019, 07/03/2019, 03/25/2020   Pneumococcal Polysaccharide-23 03/01/2019   Tdap 08/02/2018   Zoster Recombinant(Shingrix) 12/01/2018, 02/07/2019    TDAP status: Up to date  Flu Vaccine status: Up to date  Pneumococcal vaccine status: Up to date  Covid-19 vaccine status: Declined, Education has been provided regarding the importance of this vaccine but patient still declined. Advised may receive this vaccine at local pharmacy or Health Dept.or vaccine clinic. Aware to provide a copy of the vaccination record if obtained from local pharmacy or Health Dept. Verbalized acceptance and understanding.  Qualifies for Shingles Vaccine? Yes   Zostavax completed No   Shingrix Completed?: Yes  Screening Tests Health Maintenance  Topic Date Due   COVID-19 Vaccine (4 - 2024-25 season) 12/26/2022   Medicare Annual Wellness (AWV)  04/29/2023   FOOT EXAM  07/14/2023   HEMOGLOBIN A1C  08/15/2023   Diabetic kidney evaluation - eGFR measurement  02/14/2024   Diabetic kidney evaluation - Urine ACR  02/14/2024   OPHTHALMOLOGY EXAM  03/20/2024   DTaP/Tdap/Td (2 - Td or Tdap) 08/01/2028   Pneumonia Vaccine 57+ Years old  Completed   INFLUENZA VACCINE  Completed   Hepatitis C Screening  Completed   Zoster Vaccines- Shingrix  Completed   HPV VACCINES  Aged Out   Colonoscopy  Discontinued    Health Maintenance  Health Maintenance Due  Topic Date Due   COVID-19 Vaccine (4 - 2024-25 season) 12/26/2022   Medicare Annual Wellness (AWV)  04/29/2023    Colorectal cancer screening: No longer required.   Lung Cancer Screening: (Low Dose CT Chest recommended if Age 19-80 years, 20 pack-year currently smoking OR have quit w/in 15years.) does not qualify.     Additional Screening:  Hepatitis C Screening: does qualify; Completed 07/2022  Vision Screening: Recommended annual ophthalmology exams for early detection of glaucoma and  other disorders of the eye. Is the patient up to date with their annual eye exam?  Yes  Who is the provider or what is the name of the office in which the patient attends annual eye exams? [facility] If pt is not established with a provider, would they like to be referred to a provider to establish care? No .   Dental Screening: Recommended annual dental exams for proper oral hygiene  Diabetic Foot Exam: Diabetic Foot Exam: Completed 06/2022  Community Resource Referral / Chronic Care Management: CRR required this visit?  No  CCM required this visit?  No     Plan:     I have personally reviewed and noted the following in the patient's chart:   Medical and social history Use of alcohol, tobacco or illicit drugs  Current medications and supplements including opioid prescriptions. Patient is not currently taking opioid prescriptions. Functional ability and status Nutritional status Physical activity Advanced directives List of other physicians Hospitalizations, surgeries, and ER visits in previous 12 months Vitals Screenings to include cognitive, depression, and falls Referrals and appointments  In addition, I have reviewed and discussed with patient certain preventive protocols, quality metrics, and best practice recommendations. A written personalized care plan for preventive services as well as general preventive health recommendations were provided to patient.     Sydell Axon, LPN   1/60/1093   After Visit Summary: (MyChart) Due to this being a telephonic visit, the after visit summary with patients personalized plan was offered to patient via MyChart   Nurse Notes: None

## 2023-06-29 ENCOUNTER — Other Ambulatory Visit: Payer: Self-pay | Admitting: Internal Medicine

## 2023-08-17 ENCOUNTER — Ambulatory Visit: Payer: Medicare Other | Admitting: Nurse Practitioner

## 2023-08-23 ENCOUNTER — Ambulatory Visit (INDEPENDENT_AMBULATORY_CARE_PROVIDER_SITE_OTHER): Admitting: Nurse Practitioner

## 2023-08-23 ENCOUNTER — Telehealth: Payer: Self-pay | Admitting: Nurse Practitioner

## 2023-08-23 ENCOUNTER — Encounter: Payer: Self-pay | Admitting: Nurse Practitioner

## 2023-08-23 ENCOUNTER — Telehealth: Payer: Self-pay

## 2023-08-23 VITALS — BP 126/70 | HR 79 | Temp 98.8°F | Resp 16 | Ht 70.0 in | Wt 235.6 lb

## 2023-08-23 DIAGNOSIS — G4733 Obstructive sleep apnea (adult) (pediatric): Secondary | ICD-10-CM

## 2023-08-23 DIAGNOSIS — Z7189 Other specified counseling: Secondary | ICD-10-CM

## 2023-08-23 NOTE — Progress Notes (Signed)
 [facility] [address] [place], [place] [address]  Internal MEDICINE  Office Visit Note  Patient Name: Tony Lawrence  [id number]  [id number]  Date of Service: 08/23/2023  Chief Complaint  Patient presents with   Follow-up    HPI Estel presents for a follow-up visit for OSA on CPAP Waking up feeling rested in the morning  Wear CPAP for at least 7-8 hours per night.  Switched back to the nasal mask he was using before. Increased leak and slight increase in AHI. Patient's machine autotitrates with a range of 6-9 cmH2O. His machine had had him at the max set pressure. He needs the range changed so it can autotitrate higher if he needs it to.    CPAP Download Min pressure 6 cmH2O Max pressure 9 cmH2O 95 percentile pressure 9.0 CMH2O 95th percentile leak 18.4 apnea index 0.2 events/hr Hypopnea index 5.1 events/hr apnea-hypopnea index  5.3 events/hr total days used  >4 hr 81 days total days used <4 hr 0 days Total compliance 90 percent     EPWORTH SLEEPINESS SCALE: Scale: (0)= no chance of dozing; (1)= slight chance of dozing; (2)= moderate chance of dozing; (3)= high chance of dozing Chance  Situtation Sitting and reading: 0 Watching TV: 1 Sitting Inactive in public: 0 As a passenger in car: 0   Lying down to rest: 1 Sitting and talking: 0 Sitting quielty after lunch: 0 In a car, stopped in traffic: 0 TOTAL SCORE:   2 out of 24  No change in epworth SS.   Current Medication: Outpatient Encounter Medications as of 08/23/2023  Medication Sig   acetaminophen  (TYLENOL ) 500 MG tablet Take 500 mg by mouth every 6 (six) hours as needed.   atorvastatin  (LIPITOR) 20 MG tablet Take 1 tablet (20 mg total) by mouth daily at 6 PM. At night   clopidogrel  (PLAVIX ) 75 MG tablet TAKE 1 TABLET BY MOUTH DAILY   co-enzyme Q-10 30 MG capsule Take 30 mg by mouth 3 (three) times daily.   glimepiride  (AMARYL ) 1 MG tablet Take 1 tablet (1 mg total) by mouth daily with  breakfast.   glucose blood test strip E 11.9 qd Once Daily one touch   isosorbide  mononitrate (IMDUR ) 30 MG 24 hr tablet TAKE 1 TABLET BY MOUTH EVERY MORNING AND TAKE TWO TABLETS BY MOUTH EVERY EVENING   JANUVIA 100 MG tablet Take 100 mg by mouth daily.   levothyroxine  (SYNTHROID ) 200 MCG tablet Take 1 tablet (200 mcg total) by mouth daily. In am on empty stomach   liothyronine  (CYTOMEL ) 5 MCG tablet Take 0.5 tablets (2.5 mcg total) by mouth every other day.   losartan  (COZAAR ) 50 MG tablet Take 0.5 tablets (25 mg total) by mouth in the morning and at bedtime.   Multiple Vitamins-Minerals (CENTRUM SILVER 50+MEN PO) Take 1 tablet by mouth every morning.    OneTouch Delica Lancets 33G MISC Check blood sugars twice weekly   pantoprazole  (PROTONIX ) 40 MG tablet Take 1 tablet (40 mg total) by mouth daily.   hydrochlorothiazide  (HYDRODIURIL ) 12.5 MG tablet Take 1 tablet (12.5 mg total) by mouth daily.   No facility-administered encounter medications on file as of 08/23/2023.    Surgical History: Past Surgical History:  Procedure Laterality Date   BIOPSY TONGUE     11/11/15 SCC tongue    cancer of tongue  08/2020   removal of cancer   CARDIAC CATHETERIZATION N/A 11/10/2015   Procedure: Left Heart Cath and Coronary Angiography;  Surgeon: Selinda Dales  Birda Buffy, MD;  Location: [facility] INVASIVE CV LAB;  Service: Cardiovascular;  Laterality: N/A;   COLONOSCOPY  05/2006   COLONOSCOPY WITH PROPOFOL  N/A 05/24/2017   Procedure: COLONOSCOPY WITH PROPOFOL ;  Surgeon: Deveron Fly, MD;  Location: [facility] ENDOSCOPY;  Service: Endoscopy;  Laterality: N/A;   EXCISION OF TONGUE LESION N/A 11/11/2015   Procedure: EXCISION OF TONGUE LESION/ WITH FROZEN SECTION;  Surgeon: Lesly Raspberry, MD;  Location: [facility] ORS;  Service: ENT;  Laterality: N/A;   MOUTH SURGERY  04/2023   oral cancer   REPLACEMENT TOTAL KNEE Left    REPLACEMENT TOTAL KNEE Right 04/2020   [facility]   right tka minimally invasive  05/06/20 Dr. Benna Brasher      Medical History: Past Medical History:  Diagnosis Date   Actinic keratoses    Actinic keratoses    Allergy    Aneurysm of ascending aorta without rupture (HCC) 03/27/2022   Annual physical exam 02/27/2021   Aortic valve regurgitation 03/27/2022   Arthritis    low back pain s/p shots and blocks, knee pain   Arthritis of right knee 11/15/2019   BCC (basal cell carcinoma of skin)    forehead and chest Dr. 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) 11/04/2015   Chronic diarrhea 12/29/2021   Complication of anesthesia    SEVERE SORE THROAT AFTER BIOPSY 2010   Contusion of toenail 02/27/2021   Coronary artery disease    Chronic total occlusion of mid LAD   COVID-19    11/17/20   Diabetes mellitus (HCC) 03/12/2013   Diabetes mellitus without complication (HCC)    Diarrhea 03/21/2018   Erectile dysfunction due to arterial insufficiency 04/24/2019   Essential hypertension 12/22/2017   Flank pain 11/30/2013   GERD (gastroesophageal reflux disease)    H/O total knee replacement, 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   History of chicken pox    History of shingles    History of skin cancer 03/21/2018   History of tongue cancer 03/21/2018   HLD (hyperlipidemia) 03/28/2022   Hyperlipidemia    Hypertension    Hypothyroidism    Increased frequency of urination 03/12/2013   Malignant neoplasm of tongue, tip and lateral border (HCC) 11/04/2015   Mitral valve insufficiency 12/22/2019   Neuritis or radiculitis due to rupture of lumbar intervertebral disc 01/16/2014   Onychomycosis 02/27/2021   OSA on CPAP    Other intervertebral disc displacement, lumbar region 01/16/2014   Parapelvic renal cyst 12/18/2013   Primary osteoarthritis of both knees 05/13/2014   Primary osteoarthritis of left knee 11/22/2018   SCC  (squamous cell carcinoma)    invasive left Post. lateral Tongue [facility] Dr. 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 Silvestre Drum, [facility] H/o    Tongue cancer [facility])    Squamous cell CA of tongue 11/11/15 no chemo or radiation ENT Dr. Silvestre Drum, [facility] H/o    Urinary hesitancy 03/12/2013   Valvular heart disease 03/23/2021  Family History: Family History  Problem Relation Age of Onset   Myelodysplastic syndrome Mother    Arthritis Mother    Emphysema Father    Alcohol abuse Father    COPD Father    Depression Father    Early death Maternal Grandfather    Heart disease Maternal Grandfather    Stroke Maternal Grandfather     Social History   Socioeconomic History   Marital status: Married    Spouse name: Not on file   Number of children: Not on file   Years of education: Not on file   Highest education level: Bachelor's degree (e.g., BA, AB, BS)  Occupational History   Not on file  Tobacco Use   Smoking status: Never   Smokeless tobacco: Never  Vaping Use   Vaping status: Never Used  Substance and Sexual Activity   Alcohol use: Never    Alcohol/week: 0.0 standard drinks of alcohol   Drug use: No   Sexual activity: Yes  Other Topics Concern   Not on file  Social History Narrative   Married    3 sons    Engineer, maintenance (IT), former businessman in Designer, fashion/clothing retired    Owns guns, wears seat belt, safe in relationship   Social Drivers of Corporate investment banker Strain: Low Risk  (05/31/2023)   Received from [facility]   Overall Financial Resource Strain (CARDIA)    Difficulty of Paying Living Expenses: Not very hard  Food Insecurity: No Food Insecurity (07/27/2023)   Received  from [facility]   Hunger Vital Sign    Worried About Running Out of Food in the Last Year: Never true    Ran Out of Food in the Last Year: Never true  Transportation Needs: No Transportation Needs (07/27/2023)   Received from [facility]   PRAPARE - Transportation    Lack of Transportation (Medical): No    Lack of Transportation (Non-Medical): No  Physical Activity: Insufficiently Active (05/17/2023)   Exercise Vital Sign    Days of Exercise per Week: 3 days    Minutes of Exercise per Session: 30 min  Stress: No Stress Concern Present (05/17/2023)   [employer] of Occupational Health - Occupational Stress Questionnaire    Feeling of Stress : Only a little  Social Connections: Moderately Integrated (05/17/2023)   Social Connection and Isolation Panel [NHANES]    Frequency of Communication with Friends and Family: More than three times a week    Frequency of Social Gatherings with Friends and Family: More than three times a week    Attends Religious Services: More than 4 times per year    Active Member of [employer] or Organizations: No    Attends Banker Meetings: Never    Marital Status: Married  Catering manager Violence: Patient Unable To Answer (07/27/2023)   Received from [facility]   Humiliation, Afraid, Rape, and Kick questionnaire    Fear of Current or Ex-Partner: Patient unable to answer    Emotionally Abused: Patient unable to answer    Physically Abused: Patient unable to answer    Sexually Abused: Patient unable to answer      Review of Systems  Constitutional:  Negative for chills, fatigue and unexpected weight change.  HENT:  Negative for congestion, postnasal drip, rhinorrhea, sneezing and sore throat.   Respiratory: Negative.  Negative for cough, chest tightness and shortness of breath.   Cardiovascular: Negative.  Negative for chest pain and palpitations.  Gastrointestinal:  Negative for abdominal pain, constipation, diarrhea, nausea and  vomiting.  Musculoskeletal:  Negative for arthralgias, back pain, joint swelling and neck pain.  Skin:  Negative for rash.  Neurological: Negative.   Psychiatric/Behavioral:  Negative for behavioral problems (Depression), sleep disturbance and suicidal ideas. The patient is not nervous/anxious.     Vital Signs: BP 126/70   Pulse 79   Temp 98.8 F (37.1 C)   Resp 16   Ht 5\' 10"  (1.778 m)   Wt 235 lb 9.6 oz (106.9 kg)   SpO2 98%   BMI 33.81 kg/m    Physical Exam Vitals reviewed.  Constitutional:      General: He is not in acute distress.    Appearance: Normal appearance. He is obese. He is not ill-appearing.  HENT:     Head: Normocephalic and atraumatic.  Eyes:     Pupils: Pupils are equal, round, and reactive to light.  Cardiovascular:     Rate and Rhythm: Normal rate and regular rhythm.  Pulmonary:     Effort: Pulmonary effort is normal. No respiratory distress.  Neurological:     Mental Status: He is alert and oriented to person, place, and time.  Psychiatric:        Mood and Affect: Mood normal.        Behavior: Behavior normal.        Assessment/Plan: 1. OSA on CPAP (Primary) New order to adjust pressure range to 5-15 cmH2O - For home use only DME continuous positive airway pressure (CPAP)  2. CPAP use counseling Continue CPAP use and maintenance as instructed.  - For home use only DME continuous positive airway pressure (CPAP)   General Counseling: Fordyce verbalizes understanding of the findings of todays visit and agrees with plan of treatment. I have discussed any further diagnostic evaluation that may be needed or ordered today. We also reviewed his medications today. he has been encouraged to call the office with any questions or concerns that should arise related to todays visit.    Orders Placed This Encounter  Procedures   For home use only DME continuous positive airway pressure (CPAP)    No orders of the defined types were placed in this  encounter.   Return in about 6 months (around 02/22/2024) for F/U, pulmonary/sleep, Whitni Pasquini PCP.   Total time spent:20 Minutes Time spent includes review of chart, medications, test results, and follow up plan with the patient.   [place] Controlled Substance Database was reviewed by me.  This patient was seen by Laurence Pons, FNP-C in collaboration with Dr. Verneta Gone as a part of collaborative care agreement.   Arleigh Dicola R. Bobbi Burow, MSN, FNP-C Internal medicine

## 2023-08-23 NOTE — Telephone Encounter (Signed)
 Lmom that pt need to bring his cpap sdcard

## 2023-08-23 NOTE — Telephone Encounter (Signed)
 Notified Beth [facility] Sarah w/ [facility] of pressure change & supply order-Toni

## 2023-10-04 ENCOUNTER — Other Ambulatory Visit: Payer: Self-pay | Admitting: Otolaryngology

## 2023-10-04 DIAGNOSIS — C029 Malignant neoplasm of tongue, unspecified: Secondary | ICD-10-CM

## 2023-10-17 ENCOUNTER — Other Ambulatory Visit: Payer: Self-pay | Admitting: Family Medicine

## 2023-10-17 DIAGNOSIS — E119 Type 2 diabetes mellitus without complications: Secondary | ICD-10-CM

## 2023-10-17 DIAGNOSIS — I2510 Atherosclerotic heart disease of native coronary artery without angina pectoris: Secondary | ICD-10-CM

## 2023-10-19 ENCOUNTER — Ambulatory Visit
Admission: RE | Admit: 2023-10-19 | Discharge: 2023-10-19 | Disposition: A | Discharge status: Home / Self Care | Source: Ambulatory Visit | Attending: Otolaryngology | Admitting: Otolaryngology

## 2023-10-19 DIAGNOSIS — C029 Malignant neoplasm of tongue, unspecified: Secondary | ICD-10-CM | POA: Insufficient documentation

## 2023-10-19 MED ORDER — IOHEXOL 300 MG/ML  SOLN
75.0000 mL | Freq: Once | INTRAMUSCULAR | Status: AC | PRN
  Administered 2023-10-19: 75 mL via INTRAVENOUS

## 2023-11-04 ENCOUNTER — Ambulatory Visit: Admitting: Nurse Practitioner

## 2023-11-04 ENCOUNTER — Encounter: Payer: Self-pay | Admitting: Nurse Practitioner

## 2023-11-04 VITALS — BP 128/68 | HR 62 | Temp 98.0°F | Ht 70.0 in | Wt 244.0 lb

## 2023-11-04 DIAGNOSIS — C029 Malignant neoplasm of tongue, unspecified: Secondary | ICD-10-CM | POA: Diagnosis not present

## 2023-11-04 DIAGNOSIS — G4733 Obstructive sleep apnea (adult) (pediatric): Secondary | ICD-10-CM

## 2023-11-04 DIAGNOSIS — E118 Type 2 diabetes mellitus with unspecified complications: Secondary | ICD-10-CM | POA: Diagnosis not present

## 2023-11-04 DIAGNOSIS — I152 Hypertension secondary to endocrine disorders: Secondary | ICD-10-CM

## 2023-11-04 DIAGNOSIS — E1159 Type 2 diabetes mellitus with other circulatory complications: Secondary | ICD-10-CM

## 2023-11-04 DIAGNOSIS — E119 Type 2 diabetes mellitus without complications: Secondary | ICD-10-CM

## 2023-11-04 DIAGNOSIS — E1169 Type 2 diabetes mellitus with other specified complication: Secondary | ICD-10-CM

## 2023-11-04 DIAGNOSIS — Z7984 Long term (current) use of oral hypoglycemic drugs: Secondary | ICD-10-CM

## 2023-11-04 DIAGNOSIS — E785 Hyperlipidemia, unspecified: Secondary | ICD-10-CM

## 2023-11-04 DIAGNOSIS — K219 Gastro-esophageal reflux disease without esophagitis: Secondary | ICD-10-CM

## 2023-11-04 DIAGNOSIS — E039 Hypothyroidism, unspecified: Secondary | ICD-10-CM

## 2023-11-04 DIAGNOSIS — I2510 Atherosclerotic heart disease of native coronary artery without angina pectoris: Secondary | ICD-10-CM

## 2023-11-04 NOTE — Progress Notes (Signed)
 Leron Glance, NP-C Phone: [phone]  Tony Lawrence is a 78 y.o. male who presents today for transfer of care.   Discussed the use of AI scribe software for clinical note transcription with the patient, who gave verbal consent to proceed.  History of Present Illness   Tony Lawrence is a 78 year old male who presents for transfer of care.  He has a history of oral cancer and has undergone approximately five surgeries, the most recent involving flap surgery, skin graft, and the removal of three teeth, gum, and part of his tongue. He sees an ENT specialist for his oral cancer.  He has diabetes with a recent increase in A1c from 6.4 to 7.8. He recently started taking Rybelsus  and continues with glimepiride . Fasting blood sugar levels are in the 120s, with a recent reading of 159, but postprandial levels are under 180. He previously experienced hypoglycemia while on metformin , which he discontinued due to diarrhea. He also stopped taking Januvia and Jardiance . No excessive thirst, excessive urination, or recent episodes of hypoglycemia.  He has hypothyroidism and takes levothyroxine  and Cytomel . No heart palpitations or temperature regulation issues.  He has sleep apnea and uses a CPAP machine. No shortness of breath or cough.  He is on losartan  and hydrochlorothiazide  for hypertension and does not check his blood pressure frequently at home. No chest pain, dizziness, or leg swelling. He also takes isosorbide .      Social History   Tobacco Use  Smoking Status Never  Smokeless Tobacco Never    Current Outpatient Medications on File Prior to Visit  Medication Sig Dispense Refill   acetaminophen  (TYLENOL ) 500 MG tablet Take 500 mg by mouth every 6 (six) hours as needed.     clopidogrel  (PLAVIX ) 75 MG tablet TAKE 1 TABLET BY MOUTH DAILY 90 tablet 3   co-enzyme Q-10 30 MG capsule Take 30 mg by mouth 3 (three) times daily.     glucose blood test strip E 11.9 qd Once Daily one touch 100  each 12   isosorbide  mononitrate (IMDUR ) 30 MG 24 hr tablet TAKE 1 TABLET BY MOUTH EVERY MORNING AND TAKE TWO TABLETS BY MOUTH EVERY EVENING 270 tablet 3   losartan  (COZAAR ) 50 MG tablet Take 0.5 tablets (25 mg total) by mouth in the morning and at bedtime. 90 tablet 3   Multiple Vitamins-Minerals (CENTRUM SILVER 50+MEN PO) Take 1 tablet by mouth every morning.      OneTouch Delica Lancets 33G MISC Check blood sugars twice weekly 100 each 1   RYBELSUS  3 MG TABS Take 1 tablet by mouth every morning.     No current facility-administered medications on file prior to visit.     ROS see history of present illness  Objective  Physical Exam Vitals:   11/04/23 1541  BP: 128/68  Pulse: 62  Temp: 98 F (36.7 C)  SpO2: 98%    BP Readings from Last 3 Encounters:  11/04/23 128/68  08/23/23 126/70  03/10/23 112/62   Wt Readings from Last 3 Encounters:  11/04/23 244 lb (110.7 kg)  08/23/23 235 lb 9.6 oz (106.9 kg)  05/17/23 230 lb (104.3 kg)    Physical Exam Constitutional:      General: He is not in acute distress.    Appearance: Normal appearance.  HENT:     Head: Normocephalic.  Cardiovascular:     Rate and Rhythm: Normal rate and regular rhythm.     Heart sounds: Normal heart sounds.  Pulmonary:  Effort: Pulmonary effort is normal.     Breath sounds: Normal breath sounds.  Skin:    General: Skin is warm and dry.  Neurological:     General: No focal deficit present.     Mental Status: He is alert.  Psychiatric:        Mood and Affect: Mood normal.        Behavior: Behavior normal.      Assessment/Plan: Please see individual problem list.  Diabetes mellitus with complication (HCC) Assessment & Plan: HbA1c has increased to 7.8% with slightly elevated fasting glucose, though postprandial levels are controlled. Previous medication adjustments were made due to side effects. Continue Rybelsus  and glimepiride . Regularly monitor blood glucose. Follow up with  Endocrinology as scheduled.   Orders: -     Glimepiride ; Take 1 tablet (1 mg total) by mouth daily with breakfast.  Dispense: 90 tablet; Refill: 3  Tongue cancer [facility]) Assessment & Plan: Post extensive surgery, no further radiation is needed as per the ENT specialist. Follow up as scheduled.    Hypertension associated with diabetes (HCC) Assessment & Plan: Blood pressure is well-controlled on the current regimen. Continue losartan  and hydrochlorothiazide . Monitor blood pressure at home. Follow up with Cardiology as scheduled.    Hyperlipidemia associated with type 2 diabetes mellitus (HCC) Assessment & Plan: Managed with Lipitor. Continue Lipitor.  Orders: -     Atorvastatin  Calcium ; Take 1 tablet (20 mg total) by mouth daily.  Dispense: 90 tablet; Refill: 3  Hypothyroidism, unspecified type Assessment & Plan: Condition is stable on levothyroxine  and Cytomel  with no symptoms. Continue as prescribed. Follow up with Endocrinology as scheduled.   Orders: -     Levothyroxine  Sodium; Take 1 tablet (200 mcg total) by mouth daily. In am on empty stomach  Dispense: 90 tablet; Refill: 3 -     Liothyronine  Sodium; Take 0.5 tablets (2.5 mcg total) by mouth every other day.  Dispense: 45 tablet; Refill: 3  OSA on CPAP Assessment & Plan: Managed with CPAP without recent issues. Continue nightly CPAP use.    Gastroesophageal reflux disease without esophagitis -     Pantoprazole  Sodium; Take 1 tablet (40 mg total) by mouth daily.  Dispense: 90 tablet; Refill: 3     Return in about 6 months (around 05/06/2024) for Follow up.   Leron Glance, NP-C [facility]

## 2023-11-14 ENCOUNTER — Other Ambulatory Visit: Payer: Self-pay | Admitting: Cardiovascular Disease

## 2023-11-14 ENCOUNTER — Other Ambulatory Visit: Payer: Self-pay

## 2023-11-14 DIAGNOSIS — I2510 Atherosclerotic heart disease of native coronary artery without angina pectoris: Secondary | ICD-10-CM

## 2023-11-14 DIAGNOSIS — E119 Type 2 diabetes mellitus without complications: Secondary | ICD-10-CM

## 2023-11-14 MED ORDER — ATORVASTATIN CALCIUM 20 MG PO TABS: 20.0000 mg | ORAL_TABLET | Freq: Every day | ORAL | 1 refills | Status: DC

## 2023-11-15 ENCOUNTER — Encounter: Payer: Self-pay | Admitting: Nurse Practitioner

## 2023-11-15 MED ORDER — ATORVASTATIN CALCIUM 20 MG PO TABS
20.0000 mg | ORAL_TABLET | Freq: Every day | ORAL | 3 refills | Status: AC
Start: 2023-11-15 — End: ?

## 2023-11-15 MED ORDER — GLIMEPIRIDE 1 MG PO TABS: 1.0000 mg | ORAL_TABLET | Freq: Every day | ORAL | 3 refills | Status: DC

## 2023-11-15 MED ORDER — LIOTHYRONINE SODIUM 5 MCG PO TABS: 2.5000 ug | ORAL_TABLET | ORAL | 3 refills | Status: AC

## 2023-11-15 MED ORDER — PANTOPRAZOLE SODIUM 40 MG PO TBEC: 40.0000 mg | DELAYED_RELEASE_TABLET | Freq: Every day | ORAL | 3 refills | Status: AC

## 2023-11-15 MED ORDER — LEVOTHYROXINE SODIUM 200 MCG PO TABS: 200.0000 ug | ORAL_TABLET | Freq: Every day | ORAL | 3 refills | Status: AC

## 2023-11-15 NOTE — Assessment & Plan Note (Signed)
 Condition is stable on levothyroxine  and Cytomel  with no symptoms. Continue as prescribed. Follow up with Endocrinology as scheduled.

## 2023-11-15 NOTE — Assessment & Plan Note (Signed)
 HbA1c has increased to 7.8% with slightly elevated fasting glucose, though postprandial levels are controlled. Previous medication adjustments were made due to side effects. Continue Rybelsus  and glimepiride . Regularly monitor blood glucose. Follow up with Endocrinology as scheduled.

## 2023-11-15 NOTE — Assessment & Plan Note (Signed)
 Managed with Lipitor. Continue Lipitor.

## 2023-11-15 NOTE — Assessment & Plan Note (Signed)
 Blood pressure is well-controlled on the current regimen. Continue losartan  and hydrochlorothiazide . Monitor blood pressure at home. Follow up with Cardiology as scheduled.

## 2023-11-15 NOTE — Assessment & Plan Note (Signed)
 Managed with CPAP without recent issues. Continue nightly CPAP use.

## 2023-11-15 NOTE — Assessment & Plan Note (Signed)
 Post extensive surgery, no further radiation is needed as per the ENT specialist. Follow up as scheduled.

## 2024-01-02 ENCOUNTER — Other Ambulatory Visit: Payer: Self-pay | Admitting: Otolaryngology

## 2024-01-02 DIAGNOSIS — R599 Enlarged lymph nodes, unspecified: Secondary | ICD-10-CM

## 2024-01-23 ENCOUNTER — Encounter: Payer: Self-pay | Admitting: Sleep Medicine

## 2024-01-23 ENCOUNTER — Ambulatory Visit
Admission: RE | Admit: 2024-01-23 | Discharge: 2024-01-23 | Disposition: A | Discharge status: Home / Self Care | Source: Ambulatory Visit | Attending: Otolaryngology | Admitting: Otolaryngology

## 2024-01-23 ENCOUNTER — Ambulatory Visit: Admitting: Sleep Medicine

## 2024-01-23 VITALS — BP 118/60 | HR 75 | Temp 97.7°F | Ht 70.0 in | Wt 235.8 lb

## 2024-01-23 DIAGNOSIS — I10 Essential (primary) hypertension: Secondary | ICD-10-CM | POA: Diagnosis not present

## 2024-01-23 DIAGNOSIS — G4733 Obstructive sleep apnea (adult) (pediatric): Secondary | ICD-10-CM | POA: Diagnosis not present

## 2024-01-23 DIAGNOSIS — R599 Enlarged lymph nodes, unspecified: Secondary | ICD-10-CM | POA: Diagnosis present

## 2024-01-23 NOTE — Progress Notes (Signed)
 Name:Tony Lawrence MRN: [id number] DOB: 1945/12/29   CHIEF COMPLAINT:  ESTABLISH CARE FOR OSA   HISTORY OF PRESENT ILLNESS: [name] is a 78 y.o. w/ a h/o OSA, CAD, GERD, morbid obesity and HTN who presents to establish care for OSA. Reports that he was diagnosed with OSA several years ago and was subsequently started on CPAP therapy. Reports using CPAP therapy every night, which is confirmed by compliance data. He is currently using the Airfit N20 nasal mask, which is comfortable. Reports not feeling as refreshed upon awakening with CPAP therapy. Reports nocturnal awakenings due to nocturia, and occasionally has falling back to sleep. Reports a 65 lb weight loss over the last several years. Admits to dry mouth and occasional morning headaches. Denies RLS symptoms, dream enactment, cataplexy, hypnagogic or hypnapompic hallucinations. Reports a family history of sleep apnea. Denies drowsy driving. Drinks 1 cup of tea daily, denies alcohol, tobacco or illicit drug use.   Bedtime 9-10 pm Sleep onset 10 mins Rise time 5:30 am   EPWORTH SLEEP SCORE 11    01/23/2024    2:00 PM  Results of the Epworth flowsheet  Sitting and reading 1  Watching TV 2  Sitting, inactive in a public place (e.g. a theatre or a meeting) 2  As a passenger in a car for an hour without a break 1  Lying down to rest in the afternoon when circumstances permit 3  Sitting and talking to someone 0  Sitting quietly after a lunch without alcohol 2  In a car, while stopped for a few minutes in traffic 0  Total score 11    PAST MEDICAL HISTORY :   has a past medical history of Actinic keratoses, Actinic keratoses, Allergy (1977), Aneurysm of ascending aorta without rupture (03/27/2022), Annual physical exam (02/27/2021), Aortic valve regurgitation (03/27/2022), Arthritis (1996), Arthritis of right knee (11/15/2019), BCC (basal cell carcinoma of skin), Benign prostatic hyperplasia with urinary obstruction  (03/06/2012), CAD (coronary artery disease), CAD (coronary artery disease) (03/28/2022), 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) (11/04/2015), Cataract, Chronic diarrhea (12/29/2021), Complication of anesthesia, Contusion of toenail (02/27/2021), Coronary artery disease, COVID-19, Diabetes mellitus (HCC) (03/12/2013), Diabetes mellitus without complication (HCC) (2000), Diarrhea (03/21/2018), Erectile dysfunction due to arterial insufficiency (04/24/2019), Essential hypertension (12/22/2017), Flank pain (11/30/2013), GERD (gastroesophageal reflux disease), H/O total knee replacement, left (11/15/2019), History of chicken pox, History of shingles, History of skin cancer (03/21/2018), History of tongue cancer (03/21/2018), HLD (hyperlipidemia) (03/28/2022), Hyperlipidemia, Hypertension, Hypothyroidism, Increased frequency of urination (03/12/2013), Malignant neoplasm of tongue, tip and lateral border (HCC) (11/04/2015), Mitral valve insufficiency (12/22/2019), Neuritis or radiculitis due to rupture of lumbar intervertebral disc (01/16/2014), Onychomycosis (02/27/2021), OSA on CPAP, Other intervertebral disc displacement, lumbar region (01/16/2014), Parapelvic renal cyst (12/18/2013), Primary osteoarthritis of both knees (05/13/2014), Primary osteoarthritis of left knee (11/22/2018), SCC (squamous cell carcinoma), Sleep apnea (1992), Slowing of urinary stream (03/12/2013), Squamous cell carcinoma in situ, Squamous cell carcinoma in situ, Stroke (HCC), Thrombocytopenia (01/27/2022), Tongue cancer (HCC), Tongue cancer (HCC), Urinary hesitancy (03/12/2013), and Valvular heart disease (03/23/2021).  has a past surgical history that includes Colonoscopy (05/2006); Biopsy tongue; Cardiac catheterization (N/A, 11/10/2015); Excision of tongue lesion (N/A, 11/11/2015); Colonoscopy with propofol  (N/A, 05/24/2017); Replacement total knee (Left); right tka minimally invasive 05/06/20 Dr. Joyce Grater; cancer of tongue  (08/2020); Replacement total knee (Right, 04/2020); Mouth surgery (04/2023); Vasectomy (1977); and Joint replacement (2022). Prior to Admission medications   Medication Sig Start Date End Date Taking? Authorizing Provider  ibuprofen (ADVIL) 600 MG tablet Take 600 mg by mouth. 05/10/23  Yes [provider]  acetaminophen  (TYLENOL ) 500 MG tablet Take 500 mg by mouth every 6 (six) hours as needed.    [provider]  atorvastatin  (LIPITOR) 20 MG tablet Take 1 tablet (20 mg total) by mouth daily. 11/15/23   Gretel App, NP  atorvastatin  (LIPITOR) 20 MG tablet Take 1 tablet (20 mg total) by mouth daily. 11/14/23   Gretel App, NP  clopidogrel  (PLAVIX ) 75 MG tablet TAKE 1 TABLET BY MOUTH DAILY 02/28/23   End, Lonni, MD  co-enzyme Q-10 30 MG capsule Take 30 mg by mouth 3 (three) times daily.    [provider]  Cyanocobalamin  1000 MCG/ML LIQD Take 1 drop by mouth.    [provider]  glimepiride  (AMARYL ) 2 MG tablet Take 2 mg by mouth daily.    [provider]  glucose blood test strip E 11.9 qd Once Daily one touch 09/25/21   McLean-Scocuzza, Randine SAILOR, MD  hydrochlorothiazide  (HYDRODIURIL ) 12.5 MG tablet TAKE 1 TABLET BY MOUTH DAILY 11/14/23   Darron Deatrice LABOR, MD  isosorbide  mononitrate (IMDUR ) 30 MG 24 hr tablet TAKE 1 TABLET BY MOUTH EVERY MORNING AND TAKE TWO TABLETS BY MOUTH EVERY EVENING 06/30/23   End, Lonni, MD  levothyroxine  (SYNTHROID ) 200 MCG tablet Take 1 tablet (200 mcg total) by mouth daily. In am on empty stomach 11/15/23   Gretel App, NP  liothyronine  (CYTOMEL ) 5 MCG tablet Take 0.5 tablets (2.5 mcg total) by mouth every other day. 11/15/23   Gretel App, NP  losartan  (COZAAR ) 50 MG tablet Take 0.5 tablets (25 mg total) by mouth in the morning and at bedtime. 03/28/23   End, Lonni, MD  Multiple Vitamins-Minerals (CENTRUM SILVER 50+MEN PO) Take 1 tablet by mouth every morning.     [provider]  OneTouch Delica Lancets 33G  MISC Check blood sugars twice weekly 04/06/22   Hope Merle, MD  pantoprazole  (PROTONIX ) 40 MG tablet Take 1 tablet (40 mg total) by mouth daily. 11/15/23   Gretel App, NP  RYBELSUS  7 MG TABS Take 1 tablet by mouth daily.    [provider]   Allergies  Allergen Reactions   Penicillins Hives, Rash, Other (See Comments) and Swelling    Has patient had a PCN reaction causing immediate rash, facial/tongue/throat swelling, SOB or lightheadedness with hypotension: Yes Has patient had a PCN reaction causing severe rash involving mucus membranes or skin necrosis: No Has patient had a PCN reaction that required hospitalization No Has patient had a PCN reaction occurring within the last 10 years: No If all of the above answers are NO, then may proceed with Cephalosporin use.     FAMILY HISTORY:  family history includes Alcohol abuse in his father; Arthritis in his mother; COPD in his father; Depression in his father; Early death in his maternal grandfather; Emphysema in his father; Heart disease in his maternal grandfather; Myelodysplastic syndrome in his mother; Stroke in his maternal grandfather. SOCIAL HISTORY:  reports that he has never smoked. He has never used smokeless tobacco. He reports that he does not drink alcohol and does not use drugs.   Review of Systems:  Gen:  Denies  fever, sweats, chills weight loss  HEENT: Denies blurred vision, double vision, ear pain, eye pain, hearing loss, nose bleeds, sore throat Cardiac:  No dizziness, chest pain or heaviness, chest tightness,edema, No JVD Resp:   No cough, -sputum production, -shortness of breath,-wheezing, -hemoptysis,  Gi: Denies swallowing difficulty, stomach pain, nausea or vomiting, diarrhea, constipation, bowel incontinence Gu:  Denies bladder incontinence, burning urine Ext:   Denies Joint pain, stiffness or swelling Skin: Denies  skin rash, easy bruising or bleeding or hives Endoc:  Denies polyuria, polydipsia ,  polyphagia or weight change Psych:   Denies depression, insomnia or hallucinations  Other:  All other systems negative  VITAL SIGNS: BP 118/60   Pulse 75   Temp 97.7 F (36.5 C)   Ht 5' 10 (1.778 m)   Wt 235 lb 12.8 oz (107 kg)   SpO2 95%   BMI 33.83 kg/m    Physical Examination:   General Appearance: No distress  EYES PERRLA, EOM intact.   NECK Supple, No JVD Pulmonary: normal breath sounds, No wheezing.  CardiovascularNormal S1,S2.  No m/r/g.   Abdomen: Benign, Soft, non-tender. Skin:   warm, no rashes, no ecchymosis  Extremities: normal, no cyanosis, clubbing. Neuro:without focal findings,  speech normal  PSYCHIATRIC: Mood, affect within normal limits.   ASSESSMENT AND PLAN  OSA Patient is using and benefiting from CPAP therapy. Due to elevated AHI, will increase max pressure setting to 16 cm H2O. Discussed the consequences of untreated sleep apnea. Advised not to drive drowsy for safety of patient and others. Will f/u in 6 weeks.    HTN Stable, on current management. Following with PCP.    Patient  satisfied with Plan of action and management. All questions answered  I spent a total of 47 minutes reviewing chart data, face-to-face evaluation with the patient, counseling and coordination of care as detailed above.    Glora Hulgan, M.D.  Sleep Medicine [facility]

## 2024-01-23 NOTE — Patient Instructions (Addendum)

## 2024-02-07 NOTE — Progress Notes (Signed)
 History of present illness: Tony Lawrence is 78 y.o. male seen in follow up. He was last seen on 09/28/2024. He is here alone.    Type 2 Diabetes -- His Hb A1c is now 7.0%. His glucometer was reviewed. He checks sugars most mornings. Fasting sugars are in the range of 79 - 101 mg/dl.  For his diabetes. he is taking glimepiride  2 mg QAM and Rybelsus  7 mg daily. Rybelsus  was started in 11/2023. Diabetes is complicated by vascular disease (CAD and TIA in 03/2022) and mild microalbuminuria. He last had an eye exam in 02/2023 at [facility]. He is taking and tolerating an ARB.    Previously tried medications for diabetes: Metformin  -- d/c in 09/2021 due to intolerable diarrhea/GI upset Jardiance  -- d/c in 09/2021 due to yeast infections  Rybelsus  -- d/c in 04/2022 due to high co-pay cost  Past Medical History:  Diagnosis Date  . Actinic keratosis of scalp    followed by Dr. Jakie  . Adrenal nodule (HHS-HCC)    Right adrenal nodule noticed on CT scan in 2011   . Allergic rhinitis   . Arthritis   . CAD (coronary artery disease)   . Carotid artery disease   . Diabetes mellitus type 2   . Difficult airway   . Emphysema lung (CMS/HHS-HCC)   . GERD (gastroesophageal reflux disease)   . History of chickenpox   . History of shingles   . Hypercholesterolemia 03/28/2016  . Hypertension   . Hypothyroidism   . OSA (obstructive sleep apnea)   . Osteoarthritis   . Squamous cell cancer of tongue (CMS/HHS-HCC)    SCC of left lateral tongue (initial diagnosis 2017) with recurrence requiring surgery 2025  . TIA (transient ischemic attack) 03/2022    Outpatient Medications Marked as Taking for the 02/07/24 encounter (Office Visit) with Solum, Anna Melissa, MD  Medication Sig Dispense Refill  . acetaminophen  (TYLENOL ) 500 MG tablet Take 500 mg by mouth every 6 (six) hours as needed    . atorvastatin  (LIPITOR) 20 MG tablet Take 20 mg by mouth once daily    . bisoprolol -hydrochlorothiazide  (ZIAC )  2.5-6.25 mg tablet Take 1 tablet by mouth once daily.    . clopidogreL  (PLAVIX ) 75 mg tablet Take 1 tablet (75 mg total) by mouth once daily RESTART ON THUR 05/03/19  0  . cyanocobalamin , vitamin B-12, (VITAMIN B12 ORAL) Take by mouth    . glimepiride  (AMARYL ) 2 MG tablet Take 1 tablet (2 mg total) by mouth daily with breakfast 90 tablet 1  . isosorbide  mononitrate (IMDUR ) 30 MG ER tablet Take 1 tablet by mouth in the morning Take 2 tablets by mouth in the evening      . levothyroxine  (SYNTHROID ) 200 MCG tablet 1 tab by mouth daily    . liothyronine  (CYTOMEL ) 5 MCG tablet Take 1/2 tablet (2.5 mcg) by mouth every other day      . losartan  (COZAAR ) 50 MG tablet Take 50 mg by mouth once daily    . multivitamin-lutein (CENTRUM SILVER) tablet Take 1 tablet by mouth once daily    . pantoprazole  (PROTONIX ) 40 MG DR tablet Take 1 tablet by mouth once daily    . semaglutide  (RYBELSUS ) 7 mg tablet Take 1 tablet (7 mg total) by mouth once daily Do not cut, crush, or chew 30 tablet 2    Exam: BP 128/62   Pulse 63   Ht 177.8 cm (5' 10)   Wt (!) 106.1 kg (234 lb)   SpO2  97%   BMI 33.58 kg/m  GEN: well developed male in NAD. SKIN: A bilateral bare foot exam shows no ulcerations or calluses.  EXT: No peripheral edema PSYC: alert and oriented, good insight.   Labs Appointment on 01/25/2024  Component Date Value Ref Range Status  . Glucose 01/25/2024 109  70 - 110 mg/dL Final  . Sodium 89/98/7974 139  136 - 145 mmol/L Final  . Potassium 01/25/2024 4.2  3.6 - 5.1 mmol/L Final  . Chloride 01/25/2024 104  97 - 109 mmol/L Final  . Carbon Dioxide (CO2) 01/25/2024 28.2  22.0 - 32.0 mmol/L Final  . Calcium  01/25/2024 8.9  8.7 - 10.3 mg/dL Final  . Urea Nitrogen (BUN) 01/25/2024 16  7 - 25 mg/dL Final  . Creatinine 89/98/7974 1.1  0.7 - 1.3 mg/dL Final  . Glomerular Filtration Rate (eGFR) 01/25/2024 69  >60 mL/min/1.73sq m Final   CKD-EPI (2021) does not include patient's race in the calculation of  eGFR.  Monitoring changes of plasma creatinine and eGFR over time is useful for monitoring kidney function.   Interpretive Ranges for eGFR (CKD-EPI 2021):  eGFR:       >60 mL/min/1.73 sq. m - Normal eGFR:       30-59 mL/min/1.73 sq. m - Moderately Decreased eGFR:       15-29 mL/min/1.73 sq. m  - Severely Decreased eGFR:       < 15 mL/min/1.73 sq. m  - Kidney Failure    Note: These eGFR calculations do not apply in acute situations when eGFR is changing rapidly or patients on dialysis.  SABRA Dire Ratio 01/25/2024 14.5  6.0 - 20.0 Final  . Anion Gap w/K 01/25/2024 11.0  6.0 - 16.0 Final  . Hemoglobin A1C 01/25/2024 7.0 (H)  4.2 - 5.6 % Final  . Average Blood Glucose (Calc) 01/25/2024 154  mg/dL Final    Assessment: 1. Type 2 diabetes mellitus with vascular disease (CMS/HHS-HCC)   2. Type 2 diabetes mellitus with microalbuminuria, without long-term current use of insulin  (CMS/HHS-HCC)   3. Hyperlipidemia associated with type 2 diabetes mellitus (CMS/HHS-HCC)   4. Hypertension associated with type 2 diabetes mellitus (CMS/HHS-HCC)     Plan: - Diabetes is controlled. A reasonable target Hb A1c for his age is <7.5%.  - Continue glimepiride . - Continue Rybelsus  7 mg daily.  - Counseled him to check blood glucose daily. Bring glucometer to office visits.  - Continue ARB. - Continue atorvastatin . - Continue annual eye exams.  - Follow up in 4 -6 months, or sooner should any other issues arise.

## 2024-02-12 ENCOUNTER — Emergency Department

## 2024-02-12 ENCOUNTER — Other Ambulatory Visit: Payer: Self-pay

## 2024-02-12 ENCOUNTER — Inpatient Hospital Stay
Admission: EM | Admit: 2024-02-12 | Discharge: 2024-02-14 | DRG: 682 | Disposition: A | Discharge status: Home / Self Care | Attending: Internal Medicine | Admitting: Internal Medicine

## 2024-02-12 ENCOUNTER — Inpatient Hospital Stay

## 2024-02-12 ENCOUNTER — Ambulatory Visit: Admission: EM | Admit: 2024-02-12 | Discharge: 2024-02-12 | Disposition: A | Discharge status: Home / Self Care

## 2024-02-12 DIAGNOSIS — R42 Dizziness and giddiness: Secondary | ICD-10-CM | POA: Diagnosis not present

## 2024-02-12 DIAGNOSIS — Z8619 Personal history of other infectious and parasitic diseases: Secondary | ICD-10-CM

## 2024-02-12 DIAGNOSIS — I10 Essential (primary) hypertension: Secondary | ICD-10-CM | POA: Diagnosis present

## 2024-02-12 DIAGNOSIS — N401 Enlarged prostate with lower urinary tract symptoms: Secondary | ICD-10-CM | POA: Diagnosis present

## 2024-02-12 DIAGNOSIS — Z79890 Hormone replacement therapy (postmenopausal): Secondary | ICD-10-CM

## 2024-02-12 DIAGNOSIS — M48062 Spinal stenosis, lumbar region with neurogenic claudication: Secondary | ICD-10-CM | POA: Diagnosis present

## 2024-02-12 DIAGNOSIS — E66811 Obesity, class 1: Secondary | ICD-10-CM | POA: Diagnosis present

## 2024-02-12 DIAGNOSIS — E860 Dehydration: Secondary | ICD-10-CM

## 2024-02-12 DIAGNOSIS — Z23 Encounter for immunization: Secondary | ICD-10-CM

## 2024-02-12 DIAGNOSIS — G4733 Obstructive sleep apnea (adult) (pediatric): Secondary | ICD-10-CM | POA: Diagnosis present

## 2024-02-12 DIAGNOSIS — I71019 Dissection of thoracic aorta, unspecified: Secondary | ICD-10-CM | POA: Diagnosis not present

## 2024-02-12 DIAGNOSIS — Z8616 Personal history of COVID-19: Secondary | ICD-10-CM

## 2024-02-12 DIAGNOSIS — I2510 Atherosclerotic heart disease of native coronary artery without angina pectoris: Secondary | ICD-10-CM | POA: Diagnosis present

## 2024-02-12 DIAGNOSIS — E039 Hypothyroidism, unspecified: Secondary | ICD-10-CM | POA: Diagnosis present

## 2024-02-12 DIAGNOSIS — N179 Acute kidney failure, unspecified: Secondary | ICD-10-CM | POA: Diagnosis present

## 2024-02-12 DIAGNOSIS — I959 Hypotension, unspecified: Secondary | ICD-10-CM | POA: Diagnosis not present

## 2024-02-12 DIAGNOSIS — Z9852 Vasectomy status: Secondary | ICD-10-CM

## 2024-02-12 DIAGNOSIS — Z79899 Other long term (current) drug therapy: Secondary | ICD-10-CM

## 2024-02-12 DIAGNOSIS — Z8249 Family history of ischemic heart disease and other diseases of the circulatory system: Secondary | ICD-10-CM

## 2024-02-12 DIAGNOSIS — R112 Nausea with vomiting, unspecified: Secondary | ICD-10-CM

## 2024-02-12 DIAGNOSIS — E785 Hyperlipidemia, unspecified: Secondary | ICD-10-CM | POA: Diagnosis present

## 2024-02-12 DIAGNOSIS — E119 Type 2 diabetes mellitus without complications: Secondary | ICD-10-CM | POA: Diagnosis present

## 2024-02-12 DIAGNOSIS — Z85810 Personal history of malignant neoplasm of tongue: Secondary | ICD-10-CM

## 2024-02-12 DIAGNOSIS — M546 Pain in thoracic spine: Secondary | ICD-10-CM | POA: Diagnosis present

## 2024-02-12 DIAGNOSIS — K219 Gastro-esophageal reflux disease without esophagitis: Secondary | ICD-10-CM | POA: Diagnosis present

## 2024-02-12 DIAGNOSIS — R571 Hypovolemic shock: Secondary | ICD-10-CM | POA: Diagnosis present

## 2024-02-12 DIAGNOSIS — Z96653 Presence of artificial knee joint, bilateral: Secondary | ICD-10-CM | POA: Diagnosis present

## 2024-02-12 DIAGNOSIS — R197 Diarrhea, unspecified: Secondary | ICD-10-CM

## 2024-02-12 DIAGNOSIS — Z86008 Personal history of in-situ neoplasm of other site: Secondary | ICD-10-CM

## 2024-02-12 DIAGNOSIS — Z7902 Long term (current) use of antithrombotics/antiplatelets: Secondary | ICD-10-CM

## 2024-02-12 DIAGNOSIS — A0832 Astrovirus enteritis: Secondary | ICD-10-CM | POA: Diagnosis not present

## 2024-02-12 DIAGNOSIS — G894 Chronic pain syndrome: Secondary | ICD-10-CM | POA: Diagnosis present

## 2024-02-12 DIAGNOSIS — I351 Nonrheumatic aortic (valve) insufficiency: Secondary | ICD-10-CM | POA: Diagnosis present

## 2024-02-12 DIAGNOSIS — Z85828 Personal history of other malignant neoplasm of skin: Secondary | ICD-10-CM

## 2024-02-12 DIAGNOSIS — I5032 Chronic diastolic (congestive) heart failure: Secondary | ICD-10-CM | POA: Diagnosis present

## 2024-02-12 DIAGNOSIS — M47816 Spondylosis without myelopathy or radiculopathy, lumbar region: Secondary | ICD-10-CM | POA: Diagnosis present

## 2024-02-12 DIAGNOSIS — R1110 Vomiting, unspecified: Secondary | ICD-10-CM | POA: Diagnosis present

## 2024-02-12 DIAGNOSIS — M170 Bilateral primary osteoarthritis of knee: Secondary | ICD-10-CM | POA: Diagnosis present

## 2024-02-12 DIAGNOSIS — I110 Hypertensive heart disease with heart failure: Secondary | ICD-10-CM | POA: Diagnosis present

## 2024-02-12 DIAGNOSIS — Z6833 Body mass index (BMI) 33.0-33.9, adult: Secondary | ICD-10-CM

## 2024-02-12 DIAGNOSIS — Z880 Allergy status to penicillin: Secondary | ICD-10-CM

## 2024-02-12 DIAGNOSIS — E872 Acidosis, unspecified: Secondary | ICD-10-CM | POA: Diagnosis present

## 2024-02-12 DIAGNOSIS — K529 Noninfective gastroenteritis and colitis, unspecified: Secondary | ICD-10-CM | POA: Diagnosis present

## 2024-02-12 DIAGNOSIS — Z8673 Personal history of transient ischemic attack (TIA), and cerebral infarction without residual deficits: Secondary | ICD-10-CM

## 2024-02-12 DIAGNOSIS — Z7984 Long term (current) use of oral hypoglycemic drugs: Secondary | ICD-10-CM

## 2024-02-12 DIAGNOSIS — M549 Dorsalgia, unspecified: Secondary | ICD-10-CM | POA: Diagnosis present

## 2024-02-12 LAB — CBC
HCT: 46.5 % (ref 39.0–52.0)
Hemoglobin: 15 g/dL (ref 13.0–17.0)
MCH: 28.4 pg (ref 26.0–34.0)
MCHC: 32.3 g/dL (ref 30.0–36.0)
MCV: 87.9 fL (ref 80.0–100.0)
Platelets: 201 K/uL (ref 150–400)
RBC: 5.29 MIL/uL (ref 4.22–5.81)
RDW: 15.4 % (ref 11.5–15.5)
WBC: 10.2 K/uL (ref 4.0–10.5)
nRBC: 0 % (ref 0.0–0.2)

## 2024-02-12 LAB — GASTROINTESTINAL PANEL BY PCR, STOOL (REPLACES STOOL CULTURE)

## 2024-02-12 LAB — COMPREHENSIVE METABOLIC PANEL WITH GFR
ALT: 18 U/L (ref 0–44)
AST: 25 U/L (ref 15–41)
Albumin: 3.8 g/dL (ref 3.5–5.0)
Alkaline Phosphatase: 93 U/L (ref 38–126)
Anion gap: 16 — ABNORMAL HIGH (ref 5–15)
BUN: 25 mg/dL — ABNORMAL HIGH (ref 8–23)
CO2: 22 mmol/L (ref 22–32)
Calcium: 8.9 mg/dL (ref 8.9–10.3)
Chloride: 98 mmol/L (ref 98–111)
Creatinine, Ser: 1.89 mg/dL — ABNORMAL HIGH (ref 0.61–1.24)
GFR, Estimated: 36 mL/min — ABNORMAL LOW (ref >60)
Glucose, Bld: 105 mg/dL — ABNORMAL HIGH (ref 70–99)
Potassium: 4.8 mmol/L (ref 3.5–5.1)
Sodium: 136 mmol/L (ref 135–145)
Total Bilirubin: 1.4 mg/dL — ABNORMAL HIGH (ref 0.0–1.2)
Total Protein: 7.2 g/dL (ref 6.5–8.1)

## 2024-02-12 LAB — LACTIC ACID, PLASMA
Lactic Acid, Venous: 1.5 mmol/L (ref 0.5–1.9)
Lactic Acid, Venous: 1.9 mmol/L (ref 0.5–1.9)

## 2024-02-12 LAB — MAGNESIUM: Magnesium: 2 mg/dL (ref 1.7–2.4)

## 2024-02-12 LAB — GLUCOSE, CAPILLARY
Glucose-Capillary: 144 mg/dL — ABNORMAL HIGH (ref 70–99)
Glucose-Capillary: 51 mg/dL — ABNORMAL LOW (ref 70–99)
Glucose-Capillary: 55 mg/dL — ABNORMAL LOW (ref 70–99)
Glucose-Capillary: 60 mg/dL — ABNORMAL LOW (ref 70–99)
Glucose-Capillary: 71 mg/dL (ref 70–99)

## 2024-02-12 LAB — HEMOGLOBIN A1C
Hgb A1c MFr Bld: 6.5 % — ABNORMAL HIGH (ref 4.8–5.6)
Mean Plasma Glucose: 139.85 mg/dL

## 2024-02-12 LAB — CBG MONITORING, ED: Glucose-Capillary: 78 mg/dL (ref 70–99)

## 2024-02-12 LAB — LIPASE, BLOOD: Lipase: 24 U/L (ref 11–51)

## 2024-02-12 LAB — D-DIMER, QUANTITATIVE: D-Dimer, Quant: 0.47 ug{FEU}/mL (ref 0.00–0.50)

## 2024-02-12 LAB — TROPONIN I (HIGH SENSITIVITY): Troponin I (High Sensitivity): 6 ng/L (ref <18)

## 2024-02-12 MED ORDER — LIOTHYRONINE SODIUM 5 MCG PO TABS
2.5000 ug | ORAL_TABLET | ORAL | Status: DC
  Administered 2024-02-13: 2.5 ug via ORAL
  Filled 2024-02-12: qty 1

## 2024-02-12 MED ORDER — DEXTROSE 50 % IV SOLN
12.5000 g | Freq: Once | INTRAVENOUS | Status: AC
Start: 2024-02-13 — End: 2024-02-12

## 2024-02-12 MED ORDER — ONDANSETRON HCL 4 MG/2ML IJ SOLN: 4.0000 mg | Freq: Four times a day (QID) | INTRAMUSCULAR | Status: DC | PRN

## 2024-02-12 MED ORDER — PANTOPRAZOLE SODIUM 40 MG PO TBEC
40.0000 mg | DELAYED_RELEASE_TABLET | Freq: Every day | ORAL | Status: DC
  Administered 2024-02-12 – 2024-02-14 (×3): 40 mg via ORAL
  Filled 2024-02-12 (×3): qty 1

## 2024-02-12 MED ORDER — ENOXAPARIN SODIUM 40 MG/0.4ML IJ SOSY
40.0000 mg | PREFILLED_SYRINGE | INTRAMUSCULAR | Status: DC
  Administered 2024-02-12 – 2024-02-13 (×2): 40 mg via SUBCUTANEOUS
  Filled 2024-02-12 (×2): qty 0.4

## 2024-02-12 MED ORDER — ATORVASTATIN CALCIUM 20 MG PO TABS
20.0000 mg | ORAL_TABLET | Freq: Every day | ORAL | Status: DC
  Administered 2024-02-12 – 2024-02-14 (×3): 20 mg via ORAL
  Filled 2024-02-12 (×3): qty 1

## 2024-02-12 MED ORDER — LEVOTHYROXINE SODIUM 100 MCG PO TABS
200.0000 ug | ORAL_TABLET | Freq: Every day | ORAL | Status: DC
  Administered 2024-02-13 – 2024-02-14 (×2): 200 ug via ORAL
  Filled 2024-02-12 (×2): qty 2

## 2024-02-12 MED ORDER — DEXTROSE 50 % IV SOLN
INTRAVENOUS | Status: AC
  Administered 2024-02-12: 12.5 g via INTRAVENOUS
  Filled 2024-02-12: qty 50

## 2024-02-12 MED ORDER — LIOTHYRONINE SODIUM 5 MCG PO TABS
2.5000 ug | ORAL_TABLET | ORAL | Status: DC
  Filled 2024-02-12: qty 1

## 2024-02-12 MED ORDER — ACETAMINOPHEN 500 MG PO TABS
500.0000 mg | ORAL_TABLET | Freq: Four times a day (QID) | ORAL | Status: DC | PRN
  Filled 2024-02-12: qty 1

## 2024-02-12 MED ORDER — CLOPIDOGREL BISULFATE 75 MG PO TABS
75.0000 mg | ORAL_TABLET | Freq: Every day | ORAL | Status: DC
  Administered 2024-02-12 – 2024-02-14 (×3): 75 mg via ORAL
  Filled 2024-02-12 (×3): qty 1

## 2024-02-12 MED ORDER — LACTATED RINGERS IV SOLN: INTRAVENOUS | Status: DC

## 2024-02-12 MED ORDER — INSULIN ASPART 100 UNIT/ML IJ SOLN: 0.0000 [IU] | INTRAMUSCULAR | Status: DC

## 2024-02-12 MED ORDER — ONDANSETRON HCL 4 MG/2ML IJ SOLN
4.0000 mg | Freq: Once | INTRAMUSCULAR | Status: AC
  Administered 2024-02-12: 4 mg via INTRAVENOUS
  Filled 2024-02-12: qty 2

## 2024-02-12 MED ORDER — LACTATED RINGERS IV BOLUS
1000.0000 mL | Freq: Once | INTRAVENOUS | Status: AC
  Administered 2024-02-12: 1000 mL via INTRAVENOUS

## 2024-02-12 NOTE — Assessment & Plan Note (Signed)
 Upper back pain Resolved, earlier today between shoulder blades and sharp seems musculoskeletal but no history of trauma no tearing looking at the note from 08/23/2023 he does have a history of aortic ascending aneurysm, TEE seems aggressive at this point given my low clinical suspicion and he has an AKI so we will start start with a transthoracic echo and if clinical situation were to change we will consider TEE or MR angio or CT angio if renal function improves.  Patient counseled to immediately notify if this were to recur or change.  We will obtain a D-dimer given the sharp nature to exclude hypotension from large PE etc. but seems less likely.  Chest x-ray was clear without any mediastinal widening

## 2024-02-12 NOTE — ED Triage Notes (Addendum)
 Pt to ED via POV from [facility]. PT reports hypotension (SBP 80s), dizziness and N/V/D that started Friday. Pt reports ongoing lower back pain x5 days. Pt also reports possible reaction to a new medication rybelsus  for type 2 DM and started is 6wks ago. Pt is on Plavix .

## 2024-02-12 NOTE — ED Notes (Addendum)
 Echo at bedside at this time. Attempted transfer pt to the inpatient unit.

## 2024-02-12 NOTE — ED Notes (Signed)
 Pt stating was recently sick, having diarrhea several weeks ago, started to get better then came back. Also stating he changed medications for diabetes 6 weeks ago and feels like his symptoms are from the new medication as those are some of the associated reactions.

## 2024-02-12 NOTE — ED Provider Notes (Signed)
 [name]    CSN: [id number] Arrival date & time: 02/12/24  1010      History   Chief Complaint Chief Complaint  Patient presents with   Tony Lawrence   Diarrhea    HPI Tony Lawrence is a 78 y.o. male.  Accompanied by his wife, patient presents with dizziness, weakness, nausea, vomiting, diarrhea x 3 days.  He attempted to sip water this morning and vomited.  He has had numerous episodes of diarrhea.  He took his blood pressure last night and it was 80/40; his blood sugar was 113; he felt confused and weak and dizzy; he went to bed.  This morning his blood pressure was 104/55.  He states he has been awake since 3 AM and has had two episodes of diarrhea and the 1 episode of Tony Lawrence after attempting to drink water.    The history is provided by the patient, the spouse and medical records.    Past Medical History:  Diagnosis Date   Actinic keratoses    Actinic keratoses    Allergy 1977   Aneurysm of ascending aorta without rupture 03/27/2022   Annual physical exam 02/27/2021   Aortic valve regurgitation 03/27/2022   Arthritis 1996   low back pain s/p shots and blocks, knee pain   Arthritis of right knee 11/15/2019   BCC (basal cell carcinoma of skin)    forehead and chest Dr. 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) 11/04/2015   Cataract    Chronic diarrhea 12/29/2021   Complication of anesthesia    SEVERE SORE THROAT AFTER BIOPSY 2010   Contusion of toenail 02/27/2021   Coronary artery disease    Chronic total occlusion of mid LAD   COVID-19    11/17/20   Diabetes mellitus (HCC) 03/12/2013   Diabetes mellitus without complication (HCC) 2000   Diarrhea 03/21/2018   Erectile dysfunction due to arterial insufficiency 04/24/2019   Essential hypertension 12/22/2017   Flank pain 11/30/2013   GERD  (gastroesophageal reflux disease)    H/O total knee replacement, 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   History of chicken pox    History of shingles    History of skin cancer 03/21/2018   History of tongue cancer 03/21/2018   HLD (hyperlipidemia) 03/28/2022   Hyperlipidemia    Hypertension    Hypothyroidism    Increased frequency of urination 03/12/2013   Malignant neoplasm of tongue, tip and lateral border (HCC) 11/04/2015   Mitral valve insufficiency 12/22/2019   Neuritis or radiculitis due to rupture of lumbar intervertebral disc 01/16/2014   Onychomycosis 02/27/2021   OSA on CPAP    Other intervertebral disc displacement, lumbar region 01/16/2014   Parapelvic renal cyst 12/18/2013   Primary osteoarthritis of both knees 05/13/2014   Primary osteoarthritis of left knee 11/22/2018   SCC (squamous cell carcinoma)    invasive left Post. lateral Tongue [facility] Dr. Denys excised 08/25/20 surgical exc unc ?recurrent vs new primary    Sleep apnea 1992   Slowing of urinary stream 03/12/2013   Squamous cell carcinoma in situ    nose 2021    Squamous cell carcinoma in situ    nose 2021    Stroke (HCC)    Thrombocytopenia 01/27/2022   Tongue cancer (HCC)    Squamous cell CA of tongue 11/11/15 no chemo or radiation ENT  Dr. Herminio, [facility] H/o    Tongue cancer [facility])    Squamous cell CA of tongue 11/11/15 no chemo or radiation ENT Dr. 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   TIA (transient ischemic attack) 03/28/2022   Diabetes mellitus with complication (HCC) 03/28/2022   Chronic diastolic CHF (congestive heart  failure) (HCC) 03/28/2022   Nonrheumatic aortic valve insufficiency 03/27/2022   Centrilobular emphysema (HCC) 02/25/2021   Nodule of right lung 02/25/2021   Hypertension associated with diabetes (HCC) 11/18/2020   SCC (squamous cell carcinoma)    Tongue cancer (HCC) 08/01/2020   Obesity (BMI 30-39.9) 11/15/2019   Benign prostatic hyperplasia with nocturia 11/15/2019   DVT (deep venous thrombosis) (HCC) 07/05/2019   S/P total knee replacement using cement, left 06/13/2019   Gastroesophageal reflux disease without esophagitis 03/21/2018   OSA on CPAP 03/21/2018   Hypothyroidism 03/21/2018   Chronic low back pain 03/14/2018   Coronary artery disease of native artery of native heart with stable angina pectoris 12/22/2017   Hyperlipidemia associated with type 2 diabetes mellitus (HCC) 12/22/2017   Arthritis, degenerative 03/12/2013    Past Surgical History:  Procedure Laterality Date   BIOPSY TONGUE     11/11/15 SCC tongue    cancer of tongue  08/2020   removal of cancer   CARDIAC CATHETERIZATION N/A 11/10/2015   Procedure: Left Heart Cath and Coronary Angiography;  Surgeon: Deatrice DELENA Cage, MD;  Location: [facility] INVASIVE CV LAB;  Service: Cardiovascular;  Laterality: N/A;   COLONOSCOPY  05/2006   COLONOSCOPY WITH PROPOFOL  N/A 05/24/2017   Procedure: COLONOSCOPY WITH PROPOFOL ;  Surgeon: Gaylyn Gladis PENNER, MD;  Location: [facility] ENDOSCOPY;  Service: Endoscopy;  Laterality: N/A;   EXCISION OF TONGUE LESION N/A 11/11/2015   Procedure: EXCISION OF TONGUE LESION/ WITH FROZEN SECTION;  Surgeon: Chinita Herminio, MD;  Location: [facility] ORS;  Service: ENT;  Laterality: N/A;   JOINT REPLACEMENT  2022   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 Joyce Grater     VASECTOMY  1977       Home Medications    Prior to Admission medications   Medication Sig  Start Date End Date Taking? Authorizing Provider  acetaminophen  (TYLENOL ) 500 MG tablet Take 500 mg by mouth every 6 (six) hours as needed.    [provider]  atorvastatin  (LIPITOR) 20 MG tablet Take 1 tablet (20 mg total) by mouth daily. 11/15/23   Gretel App, NP  atorvastatin  (LIPITOR) 20 MG tablet Take 1 tablet (20 mg total) by mouth daily. 11/14/23   Gretel App, NP  clopidogrel  (PLAVIX ) 75 MG tablet TAKE 1 TABLET BY MOUTH DAILY 02/28/23   End, Lonni, MD  co-enzyme Q-10 30 MG capsule Take 30 mg by mouth 3 (three) times daily.    [provider]  Cyanocobalamin  1000 MCG/ML LIQD Take 1 drop by mouth.    [provider]  glimepiride  (AMARYL ) 2 MG  tablet Take 2 mg by mouth daily.    [provider]  glucose blood test strip E 11.9 qd Once Daily one touch 09/25/21   McLean-Scocuzza, Randine SAILOR, MD  hydrochlorothiazide  (HYDRODIURIL ) 12.5 MG tablet TAKE 1 TABLET BY MOUTH DAILY 11/14/23   Darron Deatrice LABOR, MD  ibuprofen (ADVIL) 600 MG tablet Take 600 mg by mouth. 05/10/23   [provider]  isosorbide  mononitrate (IMDUR ) 30 MG 24 hr tablet TAKE 1 TABLET BY MOUTH EVERY MORNING AND TAKE TWO TABLETS BY MOUTH EVERY EVENING 06/30/23   End, Lonni, MD  levothyroxine  (SYNTHROID ) 200 MCG tablet Take 1 tablet (200 mcg total) by mouth daily. In am on empty stomach 11/15/23   Gretel App, NP  liothyronine  (CYTOMEL ) 5 MCG tablet Take 0.5 tablets (2.5 mcg total) by mouth every other day. 11/15/23   Gretel App, NP  losartan  (COZAAR ) 50 MG tablet Take 0.5 tablets (25 mg total) by mouth in the morning and at bedtime. 03/28/23   End, Lonni, MD  Multiple Vitamins-Minerals (CENTRUM SILVER 50+MEN PO) Take 1 tablet by mouth every morning.     [provider]  OneTouch Delica Lancets 33G MISC Check blood sugars twice weekly 04/06/22   Hope Merle, MD  pantoprazole  (PROTONIX ) 40 MG tablet Take 1 tablet (40 mg total) by mouth daily. 11/15/23   Gretel App, NP   RYBELSUS  7 MG TABS Take 1 tablet by mouth daily.    [provider]    Family History Family History  Problem Relation Age of Onset   Myelodysplastic syndrome Mother    Arthritis Mother    Emphysema Father    Alcohol abuse Father    COPD Father    Depression Father    Early death Maternal Grandfather    Heart disease Maternal Grandfather    Stroke Maternal Grandfather     Social History Social History   Tobacco Use   Smoking status: Never   Smokeless tobacco: Never  Vaping Use   Vaping status: Never Used  Substance Use Topics   Alcohol use: Never    Alcohol/week: 0.0 standard drinks of alcohol   Drug use: No     Allergies   Penicillins   Review of Systems Review of Systems  Constitutional:  Positive for activity change and appetite change. Negative for fever.  Respiratory:  Negative for cough and shortness of breath.   Cardiovascular:  Negative for chest pain and palpitations.  Gastrointestinal:  Positive for diarrhea, nausea and vomiting. Negative for abdominal pain.  Neurological:  Positive for dizziness, weakness and light-headedness. Negative for syncope and numbness.  Psychiatric/Behavioral:  Positive for confusion.      Physical Exam Triage Vital Signs ED Triage Vitals  Encounter Vitals Group     BP      Girls Systolic BP Percentile      Girls Diastolic BP Percentile      Boys Systolic BP Percentile      Boys Diastolic BP Percentile      Pulse      Resp      Temp      Temp src      SpO2      Weight      Height      Head Circumference      Peak Flow      Pain Score      Pain Loc      Pain Education      Exclude from Growth Chart    No data  found.  Updated Vital Signs BP 103/62   Pulse 89   Temp 98.3 F (36.8 C)   Resp 18   SpO2 95%   Visual Acuity Right Eye Distance:   Left Eye Distance:   Bilateral Distance:    Right Eye Near:   Left Eye Near:    Bilateral Near:     Physical Exam Constitutional:       Appearance: He is ill-appearing.  HENT:     Mouth/Throat:     Mouth: Mucous membranes are dry.  Cardiovascular:     Rate and Rhythm: Normal rate and regular rhythm.     Heart sounds: Normal heart sounds.  Pulmonary:     Effort: Pulmonary effort is normal. No respiratory distress.     Breath sounds: Normal breath sounds.  Abdominal:     General: Bowel sounds are normal.     Palpations: Abdomen is soft.     Tenderness: There is no abdominal tenderness. There is no guarding or rebound.  Neurological:     General: No focal deficit present.     Mental Status: He is alert.     Gait: Gait normal.      UC Treatments / Results  Labs (all labs ordered are listed, but only abnormal results are displayed) Labs Reviewed - No data to display  EKG   Radiology No results found.  Procedures Procedures (including critical care time)  Medications Ordered in UC Medications - No data to display  Initial Impression / Assessment and Plan / UC Course  I have reviewed the triage vital signs and the nursing notes.  Pertinent labs & imaging results that were available during my care of the patient were reviewed by me and considered in my medical decision making (see chart for details).    Dehydration, dizziness, hypotension, nausea vomiting and diarrhea.  Patient's blood pressure currently is 103/62 but his chart reflects his baseline is usually 120s/70s.  He is pale and ill-appearing.  Sending patient to the ED.  Patient and his wife are agreeable to this but they declined EMS.  His wife states she will drive him to [facility] ED now.  Patient taken to her car via wheelchair.  Final Clinical Impressions(s) / UC Diagnoses   Final diagnoses:  Dehydration  Dizziness  Hypotension, unspecified hypotension type  Nausea vomiting and diarrhea     Discharge Instructions      Go to the emergency department for evaluation of your low blood pressure, dizziness, dehydration, nausea vomiting and  diarrhea.     ED Prescriptions   None    PDMP not reviewed this encounter.   Corlis Burnard DEL, NP 02/12/24 1100

## 2024-02-12 NOTE — Assessment & Plan Note (Signed)
 Diabetes mellitus Hold the patient's home Rybelsus  7 mg tablets Concern this is leading to the nausea and vomiting especially given his history of GI misadventure with endocrine drugs Hold glimepiride  given his poor p.o. intake due to nausea and vomiting right now

## 2024-02-12 NOTE — ED Notes (Signed)
 Pt taken to XR.

## 2024-02-12 NOTE — Progress Notes (Signed)
 Dr Lawence notified patients blood sugar was 55, 60. 1/2 amp D50 given as per protocol. I'm not sure why he is npo. He doesn't have any procedures ordered tomorrow.

## 2024-02-12 NOTE — ED Triage Notes (Addendum)
 Patient to Urgent Care with complaints of  back pain w/ sciatica (pain radiates into his left leg).  Poor sleep. Symptoms x5 days.   Nausea, vomiting, diarrhea that started on Friday. Reports he just started a new medication and he believes this is the cause (rybelsus ). States he checked his BP last night and had readings as low as 80/40. Felt confused/ shaky/ diaphoretic.  Checked this am after pushing fluids with a reading of 104/55.   Attempted taking zofran  and imodium w/o relief.

## 2024-02-12 NOTE — ED Notes (Signed)
 Advised nurse that patient has read bed

## 2024-02-12 NOTE — Assessment & Plan Note (Signed)
 Hypovolemic shock Resolved, reportedly systolic blood pressure 80s at home now SBP 112 [place] now at baseline

## 2024-02-12 NOTE — ED Notes (Signed)
 Pt. returned from XR.

## 2024-02-12 NOTE — Assessment & Plan Note (Signed)
 Acute kidney injury Creatinine 1.89 up from 1.1 approximately 18 days ago Hold home Advil, likely this is secondary to volume depletion from the patient's nausea and vomiting Has received 1 L crystalloid we will obtain a retroperitoneal ultrasound to rule out any obstructive process continue lactated Ringer's at 100/h obtain lactic acid to ensure the patient does not require repeat fluid bolus, checking magnesium

## 2024-02-12 NOTE — Assessment & Plan Note (Signed)
 Hypothyroidism Continue the patient's Synthroid  200 mcg and Cytomel  5 mcg Patient has a history of adrenal lesion although this does not appear to be an endocrine emergency

## 2024-02-12 NOTE — H&P (Signed)
 History and Physical    Patient: Tony Lawrence FMW:[id number] DOB: 02/27/1946 DOA: 02/12/2024 DOS: the patient was seen and examined on 02/12/2024 PCP: Gretel App, NP  Patient coming from: Home  Chief Complaint:  Chief Complaint  Patient presents with   Hypotension   Dizziness   HPI:   Tony Lawrence is a 78 year old gentleman with a past medical history of hyperlipidemia obstructive sleep apnea coronary artery disease, adrenal nodule, type 2 diabetes mellitus, gastroesophageal reflux disease, hypothyroidism and TIA who presents to the emergency department with nausea vomiting and diarrhea the patient denies any hematemesis or melena or bright red blood per rectum denies any recent travel fever chills reports his symptoms are identical to the medication side effect symptoms he received metformin  in the past.  He denies to tolerate any fluids at home reports that this episode lasted for approximately 13 days total essentially lasting for a week improving and then for the last 3 days persistent nausea vomiting and diarrhea.  He denies any sick contacts and reports his blood pressure was as low as 80s systolic at home and says significant orthostatic symptoms.  He denies any abdominal pain chest pain but does report some upper back pain which she reports is at the shoulder blade region on the left side and sharp in nature is currently gone but was present earlier today did not change with respiration.  Past medical records reviewed and summarized: Patient's endocrinology visit from 02/07/2024 last A1c was 7%, started  Rybelsus  7 mg daily August 2025 and previously discontinued metformin  09/2021 due to intolerable GI symptoms which she reports were very similar to his symptoms he is presenting with today  Twelve-lead ECG independently reviewed and interpreted: Patient is in sinus rhythm ventricular rate low 80s QTc 413 PR 194 with no acute ST changes  Review of Systems:  Constitutional: Denies  Weight Loss, Fever, Chills or Night Sweats Eyes: Denies Blurry Vision, Eye Pain or Decreased Vision ENT: Denies Sore Throat , Tinnitus, Hearing Loss Respiratory: Denies Shortness of Breath, Cough, Hemoptysis, Wheezing, Pleurisy Cardiovascular: Denies Chest Pain, Paroxsymal Nocturnal Dyspnea, Palpitations, Edema Gastrointestinal: Reports Nausea, Vomiting, Diarrhea without Hematemesis, Melena Hem/Lymphatic: Denies Easy Bruising, Blood Clots Allergy/Immun: Denies history of Hay Fever, Positive PPD or Hives All other systems were reviewed and are negative  Past Medical History:  Diagnosis Date   Actinic keratoses    Actinic keratoses    Allergy 1977   Aneurysm of ascending aorta without rupture 03/27/2022   Annual physical exam 02/27/2021   Aortic valve regurgitation 03/27/2022   Arthritis 1996   low back pain s/p shots and blocks, knee pain   Arthritis of right knee 11/15/2019   BCC (basal cell carcinoma of skin)    forehead and chest Dr. 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) 11/04/2015   Cataract    Chronic diarrhea 12/29/2021   Complication of anesthesia    SEVERE SORE THROAT AFTER BIOPSY 2010   Contusion of toenail 02/27/2021   Coronary artery disease    Chronic total occlusion of mid LAD   COVID-19    11/17/20   Diabetes mellitus (HCC) 03/12/2013   Diabetes mellitus without complication (HCC) 2000   Diarrhea 03/21/2018   Erectile dysfunction due to arterial insufficiency 04/24/2019   Essential hypertension 12/22/2017   Flank pain 11/30/2013   GERD (  gastroesophageal reflux disease)    H/O total knee replacement, 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   History of chicken pox    History of shingles    History of skin cancer 03/21/2018   History of tongue cancer 03/21/2018   HLD (hyperlipidemia) 03/28/2022   Hyperlipidemia     Hypertension    Hypothyroidism    Increased frequency of urination 03/12/2013   Malignant neoplasm of tongue, tip and lateral border (HCC) 11/04/2015   Mitral valve insufficiency 12/22/2019   Neuritis or radiculitis due to rupture of lumbar intervertebral disc 01/16/2014   Onychomycosis 02/27/2021   OSA on CPAP    Other intervertebral disc displacement, lumbar region 01/16/2014   Parapelvic renal cyst 12/18/2013   Primary osteoarthritis of both knees 05/13/2014   Primary osteoarthritis of left knee 11/22/2018   SCC (squamous cell carcinoma)    invasive left Post. lateral Tongue [facility] Dr. Denys excised 08/25/20 surgical exc unc ?recurrent vs new primary    Sleep apnea 1992   Slowing of urinary stream 03/12/2013   Squamous cell carcinoma in situ    nose 2021    Squamous cell carcinoma in situ    nose 2021    Stroke (HCC)    Thrombocytopenia 01/27/2022   Tongue cancer (HCC)    Squamous cell CA of tongue 11/11/15 no chemo or radiation ENT Dr. Herminio, [facility] H/o    Tongue cancer [facility])    Squamous cell CA of tongue 11/11/15 no chemo or radiation ENT Dr. Herminio, [facility] H/o    Urinary hesitancy 03/12/2013   Valvular heart disease 03/23/2021   Past Surgical History:  Procedure Laterality Date   BIOPSY TONGUE     11/11/15 SCC tongue    cancer of tongue  08/2020   removal of cancer   CARDIAC CATHETERIZATION N/A 11/10/2015   Procedure: Left Heart Cath and Coronary Angiography;  Surgeon: Deatrice DELENA Cage, MD;  Location: [facility] INVASIVE CV LAB;  Service: Cardiovascular;  Laterality: N/A;   COLONOSCOPY  05/2006   COLONOSCOPY WITH PROPOFOL  N/A 05/24/2017   Procedure: COLONOSCOPY WITH PROPOFOL ;  Surgeon: Gaylyn Gladis PENNER, MD;  Location: [facility] ENDOSCOPY;  Service: Endoscopy;  Laterality: N/A;   EXCISION OF TONGUE LESION N/A 11/11/2015   Procedure: EXCISION OF TONGUE LESION/ WITH FROZEN SECTION;  Surgeon: Chinita Herminio, MD;  Location: [facility] ORS;  Service: ENT;  Laterality: N/A;   JOINT REPLACEMENT   2022   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 Joyce [facility]     VASECTOMY  1977   Social History:  reports that he has never smoked. He has never used smokeless tobacco. He reports that he does not drink alcohol and does not use drugs.  Allergies  Allergen Reactions   Penicillins Hives, Rash, Other (See Comments) and Swelling    Has patient had a PCN reaction causing immediate rash, facial/tongue/throat swelling, SOB or lightheadedness with hypotension: Yes Has patient had a PCN reaction causing severe rash involving mucus membranes or skin necrosis: No Has patient had a PCN reaction that required hospitalization No Has patient had a PCN reaction occurring within the last 10 years: No If all of the above answers are NO, then may proceed with Cephalosporin use.     Family History  Problem Relation Age of Onset   Myelodysplastic syndrome Mother    Arthritis Mother    Emphysema  Father    Alcohol abuse Father    COPD Father    Depression Father    Early death Maternal Grandfather    Heart disease Maternal Grandfather    Stroke Maternal Grandfather     Prior to Admission medications   Medication Sig Start Date End Date Taking? Authorizing Provider  acetaminophen  (TYLENOL ) 500 MG tablet Take 500 mg by mouth every 6 (six) hours as needed.    [provider]  atorvastatin  (LIPITOR) 20 MG tablet Take 1 tablet (20 mg total) by mouth daily. 11/15/23   Gretel App, NP  atorvastatin  (LIPITOR) 20 MG tablet Take 1 tablet (20 mg total) by mouth daily. 11/14/23   Gretel App, NP  clopidogrel  (PLAVIX ) 75 MG tablet TAKE 1 TABLET BY MOUTH DAILY 02/28/23   End, Lonni, MD  co-enzyme Q-10 30 MG capsule Take 30 mg by mouth 3 (three) times daily.    [provider]  Cyanocobalamin  1000 MCG/ML LIQD Take 1 drop by mouth.    [provider]  glimepiride  (AMARYL ) 2 MG tablet Take 2 mg by mouth daily.    [provider]  glucose blood test strip E 11.9 qd Once Daily one touch 09/25/21   McLean-Scocuzza, Randine SAILOR, MD  hydrochlorothiazide  (HYDRODIURIL ) 12.5 MG tablet TAKE 1 TABLET BY MOUTH DAILY 11/14/23   Darron Deatrice LABOR, MD  ibuprofen (ADVIL) 600 MG tablet Take 600 mg by mouth. 05/10/23   [provider]  isosorbide  mononitrate (IMDUR ) 30 MG 24 hr tablet TAKE 1 TABLET BY MOUTH EVERY MORNING AND TAKE TWO TABLETS BY MOUTH EVERY EVENING 06/30/23   End, Lonni, MD  levothyroxine  (SYNTHROID ) 200 MCG tablet Take 1 tablet (200 mcg total) by mouth daily. In am on empty stomach 11/15/23   Gretel App, NP  liothyronine  (CYTOMEL ) 5 MCG tablet Take 0.5 tablets (2.5 mcg total) by mouth every other day. 11/15/23   Gretel App, NP  losartan  (COZAAR ) 50 MG tablet Take 0.5 tablets (25 mg total) by mouth in the morning and at bedtime. 03/28/23   End, Lonni, MD  Multiple Vitamins-Minerals (CENTRUM SILVER 50+MEN PO) Take 1 tablet by mouth every morning.     [provider]  OneTouch Delica Lancets 33G MISC Check blood sugars twice weekly 04/06/22   Hope Merle, MD  pantoprazole  (PROTONIX ) 40 MG tablet Take 1 tablet (40 mg total) by mouth daily. 11/15/23   Gretel App, NP  RYBELSUS  7 MG TABS Take 1 tablet by mouth daily.    [provider]    Physical Exam: Vitals:   02/12/24 1330 02/12/24 1400 02/12/24 1430 02/12/24 1500  BP: (!) 108/51 (!) 112/50 (!) 118/55 (!) 118/53  Pulse: 80 81 69 76  Resp: 15 16 (!) 21 12  Temp:      TempSrc:      SpO2: 99% 96% 100% 100%  Seen room 25 of the ED with patient's wife present approx 2:45 PM Constitutional:  Vital Signs as per [facility] than three noted] No Acute Distress Eyes:  Pink Conjunctiva and no Ptosis Neck:     Trachea Midline, Neck Symmetric Respiratory:   Respiratory Effort Normal: No Use of Respiratory Muscles,No  Intercostal Retractions              Lungs Clear to Auscultation Bilaterally Cardiovascular:   Heart Auscultated: Regular Regular without any added sounds or murmurs              No Lower Extremity Edema Gastrointestinal:  Abdomen soft and nontender  without palpable masses, guarding or rebound  No Palpable Splenomegaly or Hepatomegaly Psychiatric:  Patient Orientated to Time, Place and Person Patient with appropriate mood and affect Recent and Remote Memory Intact  Data Reviewed: Labs, Radiology, ECG as detailed in HPI and A/P   Assessment and Plan: * AKI (acute kidney injury) Acute kidney injury Creatinine 1.89 up from 1.1 approximately 18 days ago Hold home Advil, likely this is secondary to volume depletion from the patient's nausea and vomiting Has received 1 L crystalloid we will obtain a retroperitoneal ultrasound to rule out any obstructive process continue lactated Ringer's at 100/h obtain lactic acid to ensure the patient does not require repeat fluid bolus, checking magnesium  Nausea & vomiting Nausea vomiting and diarrhea For approximately 3 days and prior to that a week suspect likely Rybelsus  side effect holding Symptomatic relief with Zofran , obtaining a GI panel to exclude an infectious cause but seems less likely No pancreatitis by [facility] criteria benign abdominal exam, troponin reassuring and no headache to think intracranial process Plan will be to follow along clinically with symptomatic relief, lipase ordered   DM (diabetes mellitus) (HCC) Diabetes mellitus Hold the patient's home Rybelsus  7 mg tablets Concern this is leading to the nausea and vomiting especially given his history of GI misadventure with endocrine drugs Hold glimepiride  given his poor p.o. intake due to nausea and vomiting right now   CAD (coronary artery disease) Coronary artery disease Patient denies current chest pain Plavix  75 mg daily continue Troponin is unremarkable and ECG  reassuring   Hypothyroidism Hypothyroidism Continue the patient's Synthroid  200 mcg and Cytomel  5 mcg Patient has a history of adrenal lesion although this does not appear to be an endocrine emergency   Chronic diastolic CHF (congestive heart failure) (HCC) Currently volume depleted  OSA (obstructive sleep apnea) Obstructive sleep apnea CPAP at night  Hypovolemic shock (HCC) Hypovolemic shock Resolved, reportedly systolic blood pressure 80s at home now SBP 112 Mentation now at baseline  Back pain Upper back pain Resolved, earlier today between shoulder blades and sharp seems musculoskeletal but no history of trauma no tearing looking at the note from 08/23/2023 he does have a history of aortic ascending aneurysm, TEE seems aggressive at this point given my low clinical suspicion and he has an AKI so we will start start with a transthoracic echo and if clinical situation were to change we will consider TEE or MR angio or CT angio if renal function improves.  Patient counseled to immediately notify if this were to recur or change.  We will obtain a D-dimer given the sharp nature to exclude hypotension from large PE etc. but seems less likely.  Chest x-ray was clear without any mediastinal widening   HTN (hypertension) Hypertension Hold hydrochlorothiazide  12.5 mg, Imdur  60 mg, Cozaar  25 mg given the patient's volume depleted status and soft pressures     Advance Care Planning:   Code Status: Full Code as per patient   Consults: Nil   Family Communication: wife at bedside  Severity of Illness: The appropriate patient status for this patient is INPATIENT. Inpatient status is judged to be reasonable and necessary in order to provide the required intensity of service to ensure the patient's safety. The patient's presenting symptoms, physical exam findings, and initial radiographic and laboratory data in the context of their chronic comorbidities is felt to place them at high risk for  further clinical deterioration. Furthermore, it is not anticipated that the patient will be medically stable for discharge  from the hospital within 2 midnights of admission. AKI.  * I certify that at the point of admission it is my clinical judgment that the patient will require inpatient hospital care spanning beyond 2 midnights from the point of admission due to high intensity of service, high risk for further deterioration and high frequency of surveillance required.*  Author: Prentice JAYSON Lowenstein, MD 02/12/2024 3:29 PM  For on call review [email or web address].

## 2024-02-12 NOTE — ED Provider Notes (Signed)
 [facility] Provider Note    Event Date/Time   First MD Initiated Contact with Patient 02/12/24 1305     (approximate)   History   Chief Complaint Hypotension and Dizziness   HPI  Tony Lawrence is a 78 y.o. male with past medical history of hypertension, hyperlipidemia, CAD, diabetes, and HF PEF who presents to the ED complaining of hypertension and dizziness.  Patient reports that he has been dealing with some nausea, vomiting, and diarrhea over the past 3 days, began to feel very dizzy and lightheaded last night.  He checked his BP at that time and found it to be in the 80s, however he felt slightly better after waking up this morning.  He does state that the dizziness is worse when he goes to stand up and he has not been able to eat or drink much recently.  He additionally complains of some pain to his left upper back that is sharp and worse with movement.  He denies any fevers, cough, or difficulty breathing and has not had any trauma to this area.     Physical Exam   Triage Vital Signs: ED Triage Vitals  Encounter Vitals Group     BP 02/12/24 1113 (!) 110/59     Girls Systolic BP Percentile --      Girls Diastolic BP Percentile --      Boys Systolic BP Percentile --      Boys Diastolic BP Percentile --      Pulse Rate 02/12/24 1113 84     Resp 02/12/24 1113 20     Temp 02/12/24 1114 98.2 F (36.8 C)     Temp Source 02/12/24 1113 Oral     SpO2 02/12/24 1113 96 %     Weight --      Height --      Head Circumference --      Peak Flow --      Pain Score 02/12/24 1114 4     Pain Loc --      Pain Education --      Exclude from Growth Chart --     Most recent vital signs: Vitals:   02/12/24 1330 02/12/24 1400  BP: (!) 108/51 (!) 112/50  Pulse: 80 81  Resp: 15 16  Temp:    SpO2: 99% 96%    Constitutional: Alert and oriented. Eyes: Conjunctivae are normal. Head: Atraumatic. Nose: No congestion/rhinnorhea. Mouth/Throat: Mucous  membranes are moist.  Cardiovascular: Normal rate, regular rhythm. Grossly normal heart sounds.  2+ radial pulses bilaterally. Respiratory: Normal respiratory effort.  No retractions. Lungs CTAB. Gastrointestinal: Soft and nontender. No distention. Musculoskeletal: No lower extremity tenderness nor edema.  Left upper back tenderness to palpation, just below the shoulder blade. Neurologic:  Normal speech and language. No gross focal neurologic deficits are appreciated.    ED Results / Procedures / Treatments   Labs (all labs ordered are listed, but only abnormal results are displayed) Labs Reviewed  COMPREHENSIVE METABOLIC PANEL WITH GFR - Abnormal; Notable for the following components:      Result Value   Glucose, Bld 105 (*)    BUN 25 (*)    Creatinine, Ser 1.89 (*)    Total Bilirubin 1.4 (*)    GFR, Estimated 36 (*)    Anion gap 16 (*)    All other components within normal limits  CBC  URINALYSIS, ROUTINE W REFLEX MICROSCOPIC  CBG MONITORING, ED  TROPONIN I (HIGH SENSITIVITY)  EKG  ED ECG REPORT I, Carlin Palin, the attending physician, personally viewed and interpreted this ECG.   Date: 02/12/2024  EKG Time: 11:17  Rate: 83  Rhythm: normal sinus rhythm  Axis: LAD  Intervals:left anterior fascicular block  ST&T Change: None  RADIOLOGY Chest x-ray reviewed and interpreted by me with no infiltrate, Dema, or effusion.  PROCEDURES:  Critical Care performed: No  Procedures   MEDICATIONS ORDERED IN ED: Medications  lactated ringers bolus 1,000 mL (1,000 mLs Intravenous New Bag/Given 02/12/24 1407)  ondansetron  (ZOFRAN ) injection 4 mg (4 mg Intravenous Given 02/12/24 1343)     IMPRESSION / MDM / ASSESSMENT AND PLAN / ED COURSE  I reviewed the triage vital signs and the nursing notes.                              78 y.o. male with past medical history of hypertension, hyperlipidemia, diabetes, CAD, and HFpEF who presents to the ED complaining of  vomiting and diarrhea with dizziness and hypotension over the past 48 hours.  Patient's presentation is most consistent with acute presentation with potential threat to life or bodily function.  Differential diagnosis includes, but is not limited to, arrhythmia, ACS, pneumonia, gastroenteritis, dehydration, medication effect, orthostatic hypotension.  Patient nontoxic-appearing and in no acute distress, vital signs remarkable for borderline low BP but otherwise reassuring.  EKG shows no evidence of arrhythmia or ischemia and troponin within normal limits, low suspicion for cardiac etiology.  Labs do show AKI and patient describes significant orthostasis, will give IV fluid bolus.  No significant anemia, leukocytosis, or electrolyte abnormality noted and chest x-ray is unremarkable.  Symptoms consistent with a viral gastroenteritis and he has a benign abdominal exam, do not feel CT imaging is indicated.  Case discussed with hospitalist for admission.      FINAL CLINICAL IMPRESSION(S) / ED DIAGNOSES   Final diagnoses:  Vomiting and diarrhea  AKI (acute kidney injury)     Rx / DC Orders   ED Discharge Orders     None        Note:  This document was prepared using Dragon voice recognition software and may include unintentional dictation errors.   Palin Carlin, MD 02/12/24 1440

## 2024-02-12 NOTE — Assessment & Plan Note (Signed)
 Obstructive sleep apnea. CPAP at night.

## 2024-02-12 NOTE — Assessment & Plan Note (Signed)
 Hypertension Hold hydrochlorothiazide  12.5 mg, Imdur  60 mg, Cozaar  25 mg given the patient's volume depleted status and soft pressures

## 2024-02-12 NOTE — Discharge Instructions (Signed)
 Go to the emergency department for evaluation of your low blood pressure, dizziness, dehydration, nausea vomiting and diarrhea.

## 2024-02-12 NOTE — Assessment & Plan Note (Signed)
 Coronary artery disease Patient denies current chest pain Plavix  75 mg daily continue Troponin is unremarkable and ECG reassuring

## 2024-02-12 NOTE — Assessment & Plan Note (Signed)
 Currently volume depleted

## 2024-02-12 NOTE — ED Notes (Signed)
 Patient is being discharged from the Urgent Care and sent to the Emergency Department via POV . Per Burnard Cork NP, patient is in need of higher level of care due to hypotension/ dehydration/ NVD. Patient is aware and verbalizes understanding of plan of care.  Vitals:   02/12/24 1033  BP: 103/62  Pulse: 89  Resp: 18  Temp: 98.3 F (36.8 C)  SpO2: 95%

## 2024-02-12 NOTE — Assessment & Plan Note (Signed)
 Nausea vomiting and diarrhea For approximately 3 days and prior to that a week suspect likely Rybelsus  side effect holding Symptomatic relief with Zofran , obtaining a GI panel to exclude an infectious cause but seems less likely No pancreatitis by [facility] criteria benign abdominal exam, troponin reassuring and no headache to think intracranial process Plan will be to follow along clinically with symptomatic relief, lipase ordered

## 2024-02-13 ENCOUNTER — Inpatient Hospital Stay: Admit: 2024-02-13

## 2024-02-13 DIAGNOSIS — N179 Acute kidney failure, unspecified: Secondary | ICD-10-CM | POA: Diagnosis not present

## 2024-02-13 DIAGNOSIS — A0832 Astrovirus enteritis: Secondary | ICD-10-CM | POA: Insufficient documentation

## 2024-02-13 DIAGNOSIS — R571 Hypovolemic shock: Secondary | ICD-10-CM

## 2024-02-13 LAB — URINALYSIS, ROUTINE W REFLEX MICROSCOPIC
Bilirubin Urine: NEGATIVE
Glucose, UA: NEGATIVE mg/dL
Hgb urine dipstick: NEGATIVE
Ketones, ur: NEGATIVE mg/dL
Leukocytes,Ua: NEGATIVE
Nitrite: NEGATIVE
Protein, ur: NEGATIVE mg/dL
Specific Gravity, Urine: 1.018 (ref 1.005–1.030)
pH: 5 (ref 5.0–8.0)

## 2024-02-13 LAB — COMPREHENSIVE METABOLIC PANEL WITH GFR
ALT: 15 U/L (ref 0–44)
AST: 18 U/L (ref 15–41)
Albumin: 3.2 g/dL — ABNORMAL LOW (ref 3.5–5.0)
Alkaline Phosphatase: 75 U/L (ref 38–126)
Anion gap: 16 — ABNORMAL HIGH (ref 5–15)
BUN: 29 mg/dL — ABNORMAL HIGH (ref 8–23)
CO2: 21 mmol/L — ABNORMAL LOW (ref 22–32)
Calcium: 8.2 mg/dL — ABNORMAL LOW (ref 8.9–10.3)
Chloride: 101 mmol/L (ref 98–111)
Creatinine, Ser: 1.59 mg/dL — ABNORMAL HIGH (ref 0.61–1.24)
GFR, Estimated: 44 mL/min — ABNORMAL LOW (ref >60)
Glucose, Bld: 57 mg/dL — ABNORMAL LOW (ref 70–99)
Potassium: 4.1 mmol/L (ref 3.5–5.1)
Sodium: 138 mmol/L (ref 135–145)
Total Bilirubin: 1 mg/dL (ref 0.0–1.2)
Total Protein: 6.4 g/dL — ABNORMAL LOW (ref 6.5–8.1)

## 2024-02-13 LAB — CBC
HCT: 40.3 % (ref 39.0–52.0)
Hemoglobin: 12.8 g/dL — ABNORMAL LOW (ref 13.0–17.0)
MCH: 27.8 pg (ref 26.0–34.0)
MCHC: 31.8 g/dL (ref 30.0–36.0)
MCV: 87.4 fL (ref 80.0–100.0)
Platelets: 164 K/uL (ref 150–400)
RBC: 4.61 MIL/uL (ref 4.22–5.81)
RDW: 15.6 % — ABNORMAL HIGH (ref 11.5–15.5)
WBC: 7.3 K/uL (ref 4.0–10.5)
nRBC: 0 % (ref 0.0–0.2)

## 2024-02-13 LAB — GLUCOSE, CAPILLARY
Glucose-Capillary: 127 mg/dL — ABNORMAL HIGH (ref 70–99)
Glucose-Capillary: 54 mg/dL — ABNORMAL LOW (ref 70–99)
Glucose-Capillary: 56 mg/dL — ABNORMAL LOW (ref 70–99)
Glucose-Capillary: 95 mg/dL (ref 70–99)
Glucose-Capillary: 112 mg/dL — ABNORMAL HIGH (ref 70–99)
Glucose-Capillary: 123 mg/dL — ABNORMAL HIGH (ref 70–99)

## 2024-02-13 MED ORDER — DEXTROSE 50 % IV SOLN
25.0000 g | Freq: Once | INTRAVENOUS | Status: AC
  Administered 2024-02-13: 25 g via INTRAVENOUS
  Filled 2024-02-13: qty 50

## 2024-02-13 MED ORDER — DEXTROSE 50 % IV SOLN
12.5000 g | INTRAVENOUS | Status: AC
  Administered 2024-02-13: 12.5 g via INTRAVENOUS
  Filled 2024-02-13: qty 50

## 2024-02-13 MED ORDER — OXYCODONE-ACETAMINOPHEN 5-325 MG PO TABS
1.0000 | ORAL_TABLET | ORAL | Status: DC | PRN
Start: 2024-02-13 — End: 2024-02-14
  Administered 2024-02-13 – 2024-02-14 (×6): 1 via ORAL
  Filled 2024-02-13 (×6): qty 1

## 2024-02-13 MED ORDER — INFLUENZA VAC SPLIT HIGH-DOSE 0.5 ML IM SUSY
0.5000 mL | PREFILLED_SYRINGE | INTRAMUSCULAR | Status: AC
  Administered 2024-02-14: 0.5 mL via INTRAMUSCULAR
  Filled 2024-02-13: qty 0.5

## 2024-02-13 NOTE — Progress Notes (Signed)
 Mobility Specialist Progress Note:    02/13/24 1527  Mobility  Activity Ambulated with assistance  Level of Assistance Modified independent, requires aide device or extra time  Assistive Device None  Distance Ambulated (ft) 500 ft  Range of Motion/Exercises Active;All extremities  Activity Response Tolerated well  Mobility visit 1 Mobility  Mobility Specialist Start Time (ACUTE ONLY) 1448  Mobility Specialist Stop Time (ACUTE ONLY) 1509  Mobility Specialist Time Calculation (min) (ACUTE ONLY) 21 min   Pt received in bed, agreeable to mobility. ModI to stand and ambulate with no AD. Tolerated well, asx throughout. Returned to room, wife at bedside. All needs met.  Sherrilee Ditty Mobility Specialist Please contact via Special educational needs teacher or  Rehab office at [phone]

## 2024-02-13 NOTE — TOC CM/SW Note (Signed)
 Transition of Care [facility]) - Inpatient Brief Assessment   Patient Details  Name: GIL INGWERSEN MRN: [id number] Date of Birth: Apr 11, 1946  Transition of Care [facility]) CM/SW Contact:    Corean ONEIDA Haddock, RN Phone Number: 02/13/2024, 3:15 PM   Clinical Narrative:  Transition of Care (TOC) Screening Note   Patient Details  Name: KANAI BERRIOS Date of Birth: 03-16-1946   [facility]) CM/SW Contact:    Corean ONEIDA Haddock, RN Phone Number: 02/13/2024, 3:15 PM    Transition of Care Department [facility]) has reviewed patient and no TOC needs have been identified at this time.   If new patient transition needs arise, please place a TOC consult.     Transition of Care Asessment: Insurance and Status: Insurance coverage has been reviewed Patient has primary care physician: Yes     Prior/Current Home Services: No current home services Social Drivers of Health Review: SDOH reviewed no interventions necessary Readmission risk has been reviewed: Yes Transition of care needs: no transition of care needs at this time

## 2024-02-13 NOTE — Progress Notes (Signed)
 Hypoglycemic Event  CBG: 56  Treatment: D50 25 mL (12.5 gm)  Symptoms: None  Follow-up CBG: Time:0805  CBG Result:95  Possible Reasons for Event: Inadequate meal intake  Comments/MD notified:MD notified. MD started the patient on a soft diet    Tony Lawrence Tony Lawrence

## 2024-02-13 NOTE — Hospital Course (Signed)
 Tony Lawrence is a 78 year old gentleman with a past medical history of hyperlipidemia obstructive sleep apnea coronary artery disease, adrenal nodule, type 2 diabetes mellitus, gastroesophageal reflux disease, hypothyroidism and TIA who presents to the emergency department with nausea vomiting and diarrhea . He was found to have acute kidney injury, placed on IV fluids. Renal function normalized.  Medically stable for discharge.

## 2024-02-13 NOTE — Plan of Care (Signed)
  Problem: Education: Goal: Ability to describe self-care measures that may prevent or decrease complications (Diabetes Survival Skills Education) will improve Outcome: Progressing Goal: Individualized Educational Video(s) Outcome: Progressing   Problem: Fluid Volume: Goal: Ability to maintain a balanced intake and output will improve Outcome: Progressing   Problem: Health Behavior/Discharge Planning: Goal: Ability to identify and utilize available resources and services will improve Outcome: Progressing Goal: Ability to manage health-related needs will improve Outcome: Progressing

## 2024-02-13 NOTE — Progress Notes (Signed)
  Progress Note   Patient: Tony Lawrence FMW:[id number] DOB: 12/18/1945 DOA: 02/12/2024     1 DOS: the patient was seen and examined on 02/13/2024   Brief hospital course: Mr. Suydam is a 78 year old gentleman with a past medical history of hyperlipidemia obstructive sleep apnea coronary artery disease, adrenal nodule, type 2 diabetes mellitus, gastroesophageal reflux disease, hypothyroidism and TIA who presents to the emergency department with nausea vomiting and diarrhea . He was found to have acute kidney injury, placed on IV fluids.   Principal Problem:   AKI (acute kidney injury) Active Problems:   DM (diabetes mellitus) (HCC)   Nausea & vomiting   CAD (coronary artery disease)   Essential hypertension   Hypothyroidism   Chronic diastolic CHF (congestive heart failure) (HCC)   HLD (hyperlipidemia)   OSA (obstructive sleep apnea)   HTN (hypertension)   Gastroenteritis   Back pain   Hypovolemic shock (HCC)   Astrovirus gastroenteritis   Assessment and Plan: * AKI (acute kidney injury) secondary to dehydration. Mild metabolic acidosis Astrovirus gastroenteritis Acute kidney injury Creatinine 1.89 up from 1.1 approximately 18 days ago Stool study positive for astrovirus.  Patient has gastroenteritis. Patient has been treated with IV fluids, renal function improving, continue fluids for another day. Recheck renal function tomorrow.  DM (diabetes mellitus) (HCC) with hypoglycemia secondary to poor p.o. intake. Off all diabetic treatment.  CAD (coronary artery disease) No new issues, resume home treatment  Hypothyroidism Resume home treatment  Chronic diastolic CHF (congestive heart failure) (HCC) Currently volume depleted  OSA (obstructive sleep apnea) Obstructive sleep apnea CPAP at night  Hypovolemic shock (HCC) Resolved, reportedly systolic blood pressure 80s at home now SBP 112 Condition has improved.  Back pain Upper back pain Sciatica. Patient has  chronic pain, will follow the back pain clinic.  I will start as needed Percocet  HTN (hypertension) Pressure medicine on hold due to low blood pressure  Class I obesity with BMI 33. Diet exercise advised     Subjective:  Patient done complaining some sciatica pain.  Denies any short of breath.  Nausea vomiting improving, tolerating diet.  No additional diarrhea  Physical Exam: Vitals:   02/12/24 1654 02/12/24 1950 02/13/24 0345 02/13/24 0751  BP:  (!) 116/55 (!) 101/55 (!) 120/51  Pulse:  87 72 (!) 56  Resp:  16 16 16   Temp:  98.4 F (36.9 C) 98.1 F (36.7 C) 98.1 F (36.7 C)  TempSrc:      SpO2:  95% 93% 95%  Weight: 104.3 kg     Height: 5' 10 (1.778 m)      General exam: Appears calm and comfortable  Respiratory system: Clear to auscultation. Respiratory effort normal. Cardiovascular system: S1 & S2 heard, RRR. No JVD, murmurs, rubs, gallops or clicks. No pedal edema. Gastrointestinal system: Abdomen is nondistended, soft and nontender. No organomegaly or masses felt. Normal bowel sounds heard. Central nervous system: Alert and oriented. No focal neurological deficits. Extremities: Symmetric 5 x 5 power. Skin: No rashes, lesions or ulcers Psychiatry: Judgement and insight appear normal. Mood & affect appropriate.    Data Reviewed:  Renal ultrasound, chest x-ray and lab results reviewed.  Family Communication: Wife updated at bedside  Disposition: Status is: Inpatient Remains inpatient appropriate because: Severity of disease, IV treatment.     Time spent: 35 minutes  Author: Murvin Mana, MD 02/13/2024 12:28 PM  For on call review [email or web address].

## 2024-02-14 ENCOUNTER — Inpatient Hospital Stay: Admit: 2024-02-14 | Discharge: 2024-02-14 | Disposition: A | Attending: Internal Medicine

## 2024-02-14 ENCOUNTER — Telehealth: Payer: Self-pay | Admitting: Anesthesiology

## 2024-02-14 DIAGNOSIS — I71019 Dissection of thoracic aorta, unspecified: Secondary | ICD-10-CM | POA: Diagnosis not present

## 2024-02-14 DIAGNOSIS — A0832 Astrovirus enteritis: Secondary | ICD-10-CM | POA: Diagnosis not present

## 2024-02-14 DIAGNOSIS — I10 Essential (primary) hypertension: Secondary | ICD-10-CM | POA: Diagnosis not present

## 2024-02-14 DIAGNOSIS — N179 Acute kidney failure, unspecified: Secondary | ICD-10-CM | POA: Diagnosis not present

## 2024-02-14 LAB — BASIC METABOLIC PANEL WITH GFR
Anion gap: 7 (ref 5–15)
BUN: 19 mg/dL (ref 8–23)
CO2: 27 mmol/L (ref 22–32)
Calcium: 8.2 mg/dL — ABNORMAL LOW (ref 8.9–10.3)
Chloride: 105 mmol/L (ref 98–111)
Creatinine, Ser: 1.13 mg/dL (ref 0.61–1.24)
GFR, Estimated: >60 mL/min (ref >60)
Glucose, Bld: 85 mg/dL (ref 70–99)
Potassium: 4.2 mmol/L (ref 3.5–5.1)
Sodium: 139 mmol/L (ref 135–145)

## 2024-02-14 LAB — ECHOCARDIOGRAM COMPLETE
AR max vel: 2.3 cm2
AV Area VTI: 2.63 cm2
AV Area mean vel: 2.44 cm2
AV Mean grad: 6 mmHg
AV Peak grad: 10.6 mmHg
Ao pk vel: 1.63 m/s
Area-P 1/2: 3.26 cm2
Height: 70 in
MV VTI: 1.9 cm2
S' Lateral: 2.8 cm
Weight: 3680 [oz_av]

## 2024-02-14 LAB — PHOSPHORUS: Phosphorus: 2.9 mg/dL (ref 2.5–4.6)

## 2024-02-14 LAB — GLUCOSE, CAPILLARY
Glucose-Capillary: 74 mg/dL (ref 70–99)
Glucose-Capillary: 88 mg/dL (ref 70–99)
Glucose-Capillary: 99 mg/dL (ref 70–99)

## 2024-02-14 LAB — MAGNESIUM: Magnesium: 1.8 mg/dL (ref 1.7–2.4)

## 2024-02-14 NOTE — Discharge Summary (Addendum)
 Physician Discharge Summary   Patient: Tony Lawrence MRN: [id number] DOB: [birth year]  Admit date:     02/12/2024  Discharge date: 02/14/24  Discharge Physician: Murvin Mana   PCP: Gretel App, NP   Recommendations at discharge:   Follow-up with PCP in 1 week.  Discharge Diagnoses: Principal Problem:   AKI (acute kidney injury) Active Problems:   DM (diabetes mellitus) (HCC)   Nausea & vomiting   CAD (coronary artery disease)   Essential hypertension   Hypothyroidism   Chronic diastolic CHF (congestive heart failure) (HCC)   HLD (hyperlipidemia)   OSA (obstructive sleep apnea)   HTN (hypertension)   Gastroenteritis   Back pain   Hypovolemic shock (HCC)   Astrovirus gastroenteritis  Resolved Problems:   * No resolved hospital problems. [facility] Course: Tony Lawrence is a 78 year old gentleman with a past medical history of hyperlipidemia obstructive sleep apnea coronary artery disease, adrenal nodule, type 2 diabetes mellitus, gastroesophageal reflux disease, hypothyroidism and TIA who presents to the emergency department with nausea vomiting and diarrhea . He was found to have acute kidney injury, placed on IV fluids. Renal function normalized.  Medically stable for discharge.  Assessment and Plan: * AKI (acute kidney injury) secondary to dehydration. Mild metabolic acidosis Astrovirus gastroenteritis Acute kidney injury Creatinine 1.89 up from 1.1 approximately 18 days ago Stool study positive for astrovirus.  Patient has gastroenteritis. Patient has been treated with IV fluids, renal function has normalized. Metabolic acidosis resolved.  Patient no longer has any diarrhea, he is tolerating diet is medically stable for discharge.   DM (diabetes mellitus) (HCC) with hypoglycemia secondary to poor p.o. intake. Resume home treatment.   CAD (coronary artery disease) No new issues, resume home treatment   Hypothyroidism Resume home treatment   Chronic  diastolic CHF (congestive heart failure) (HCC) Resume home treatment   OSA (obstructive sleep apnea) CPAP at night   Hypovolemic shock (HCC) ruled out. Transient hypotension. Had a transient hypotension, blood pressure quickly improved after fluids.  This is not consistent with hypovolemic shock.   Back pain Upper back pain Sciatica. Patient has chronic pain. Patient will be followed by pain clinic.   HTN (hypertension) Blood pressure running high, resume home medicines except holding hydrochlorothiazide .   Class I obesity with BMI 33. Diet exercise advised             Consultants: None Procedures performed: None  Disposition: Home Diet recommendation:  Discharge Diet Orders (From admission, onward)     Start     Ordered   02/14/24 0000  Diet - low sodium heart healthy        02/14/24 1002           Cardiac diet DISCHARGE MEDICATION: Allergies as of 02/14/2024       Reactions   Penicillins Hives, Rash, Other (See Comments), Swelling   Has patient had a PCN reaction causing immediate rash, facial/tongue/throat swelling, SOB or lightheadedness with hypotension: Yes Has patient had a PCN reaction causing severe rash involving mucus membranes or skin necrosis: No Has patient had a PCN reaction that required hospitalization No Has patient had a PCN reaction occurring within the last 10 years: No If all of the above answers are NO, then may proceed with Cephalosporin use.        Medication List     STOP taking these medications    hydrochlorothiazide  12.5 MG tablet Commonly known as: HYDRODIURIL    ibuprofen 600 MG tablet Commonly known as: ADVIL  TAKE these medications    acetaminophen  500 MG tablet Commonly known as: TYLENOL  Take 500 mg by mouth every 6 (six) hours as needed.   atorvastatin  20 MG tablet Commonly known as: LIPITOR Take 1 tablet (20 mg total) by mouth daily.   CENTRUM SILVER 50+MEN PO Take 1 tablet by mouth every  morning.   clopidogrel  75 MG tablet Commonly known as: PLAVIX  TAKE 1 TABLET BY MOUTH DAILY   co-enzyme Q-10 30 MG capsule Take 30 mg by mouth daily.   Cyanocobalamin  1000 MCG/ML Liqd Take 1 drop by mouth.   glimepiride  2 MG tablet Commonly known as: AMARYL  Take 2 mg by mouth daily.   glucose blood test strip E 11.9 qd Once Daily one touch   isosorbide  mononitrate 30 MG 24 hr tablet Commonly known as: IMDUR  TAKE 1 TABLET BY MOUTH EVERY MORNING AND TAKE TWO TABLETS BY MOUTH EVERY EVENING   levothyroxine  200 MCG tablet Commonly known as: SYNTHROID  Take 1 tablet (200 mcg total) by mouth daily. In am on empty stomach   liothyronine  5 MCG tablet Commonly known as: CYTOMEL  Take 0.5 tablets (2.5 mcg total) by mouth every other day.   losartan  50 MG tablet Commonly known as: COZAAR  Take 0.5 tablets (25 mg total) by mouth in the morning and at bedtime.   OneTouch Delica Lancets 33G Misc Check blood sugars twice weekly   pantoprazole  40 MG tablet Commonly known as: PROTONIX  Take 1 tablet (40 mg total) by mouth daily.   Rybelsus  7 MG Tabs Generic drug: Semaglutide  Take 1 tablet by mouth daily.        Follow-up Information     Lester, Kacy, NP Follow up in 1 week(s).   Specialty: Nurse Practitioner Contact information: [address] [place] [address] [phone]                Discharge Exam: Tony Lawrence   02/12/24 1654  Weight: 104.3 kg   General exam: Appears calm and comfortable  Respiratory system: Clear to auscultation. Respiratory effort normal. Cardiovascular system: S1 & S2 heard, RRR. No JVD, murmurs, rubs, gallops or clicks. No pedal edema. Gastrointestinal system: Abdomen is nondistended, soft and nontender. No organomegaly or masses felt. Normal bowel sounds heard. Central nervous system: Alert and oriented. No focal neurological deficits. Extremities: Symmetric 5 x 5 power. Skin: No rashes, lesions or ulcers Psychiatry:  Judgement and insight appear normal. Mood & affect appropriate.    Condition at discharge: good  The results of significant diagnostics from this hospitalization (including imaging, microbiology, ancillary and laboratory) are listed below for reference.   Imaging Studies: US  RENAL Result Date: 02/12/2024 EXAM: US  Retroperitoneum Complete, Renal. CLINICAL HISTORY: AKI (acute kidney injury). TECHNIQUE: Real-time ultrasound of the retroperitoneum (complete) with image documentation. COMPARISON: CT 07/04/2019. FINDINGS: RIGHT KIDNEY: The right kidney measures 14.2 x 6.6 x 6.5 cm with a volume of 318 ml. There is a 5.2 cm cyst. No follow up recommended. Normal echogenicity without hydronephrosis. No renal stone or mass visualized. LEFT KIDNEY: The left kidney measures 14.2 x 5.6 x 4.8 cm with a volume of 199 ml. Normal echogenicity. No hydronephrosis, renal stone, or mass. BLADDER: Unremarkable as visualized. IMPRESSION: 1. Unremarkable renal ultrasound. No hydronephrosis. Electronically signed by: Norman Gatlin MD 02/12/2024 05:43 PM EDT RP Workstation: [id number]   DG Chest 2 View Result Date: 02/12/2024 CLINICAL DATA:  Chest pain, hypotension, dizziness, and nausea, vomiting, and diarrhea. EXAM: CHEST - 2 VIEW COMPARISON:  None Available. FINDINGS: The heart  size and mediastinal contours are within normal limits. There is elevation of the left diaphragm. No consolidation, effusion, or pneumothorax is seen. Degenerative changes are present in the thoracic spine. Surgical clips are noted in the cervical soft tissues on the left. No acute osseous abnormality. IMPRESSION: No active cardiopulmonary disease. Electronically Signed   By: Leita Birmingham M.D.   On: 02/12/2024 14:09   US  SOFT TISSUE HEAD & NECK (NON-THYROID ) Result Date: 01/24/2024 EXAM: US  Left Neck Nonvascular Soft Tissue Ultrasound 01/23/2024 01:35:27 PM TECHNIQUE: Real-time ultrasound scan of the left neck with image documentation.  COMPARISON: CT neck 10/19/2023 and lymph node biopsy 02/28/2023. CLINICAL HISTORY: left lymph node adjacent to parotid. left lymph node adjacent to parotid FINDINGS: SOFT TISSUES: Focused ultrasound of the palpable region in the left neck demonstrates a prominent cervical lymph node which measures 0.8 x 1.5 cm. This lesion likely corresponds to the previously biopsied submandibular lymph node from 02/28/2023. There is a retained fatty hilum. There are additional cervical lymph nodes within this region which measure up to 0.5 cm in short axis. Lymph nodes in this region demonstrate retention of fatty vascular hila. There is no evidence of acute inflammatory change within the surrounding soft tissues. No fluid collection in the soft tissues. IMPRESSION: 1. Prominent left cervical lymph node measuring 0.8 x 1.5 cm, likely corresponding to the previously biopsied left submandibular lymph node from 02/28/23. 2. Additional cervical lymph nodes within this region measure up to 0.5 cm in short axis, also with retained fatty vascular hila. Electronically signed by: Donnice Mania MD 01/24/2024 11:51 AM EDT RP Workstation: [id number]    Microbiology: Results for orders placed or performed during the hospital encounter of 02/12/24  Gastrointestinal Panel by PCR , Stool     Status: Abnormal   Collection Time: 02/12/24  7:26 PM   Specimen: Stool  Result Value Ref Range Status   Campylobacter species NOT DETECTED NOT DETECTED Final   Plesimonas shigelloides NOT DETECTED NOT DETECTED Final   Salmonella species NOT DETECTED NOT DETECTED Final   Yersinia enterocolitica NOT DETECTED NOT DETECTED Final   Vibrio species NOT DETECTED NOT DETECTED Final   Vibrio cholerae NOT DETECTED NOT DETECTED Final   Enteroaggregative E coli (EAEC) NOT DETECTED NOT DETECTED Final   Enteropathogenic E coli (EPEC) NOT DETECTED NOT DETECTED Final   Enterotoxigenic E coli (ETEC) NOT DETECTED NOT DETECTED Final   Shiga like toxin producing  E coli (STEC) NOT DETECTED NOT DETECTED Final   Shigella/Enteroinvasive E coli (EIEC) NOT DETECTED NOT DETECTED Final   Cryptosporidium NOT DETECTED NOT DETECTED Final   Cyclospora cayetanensis NOT DETECTED NOT DETECTED Final   Entamoeba histolytica NOT DETECTED NOT DETECTED Final   Giardia lamblia NOT DETECTED NOT DETECTED Final   Adenovirus F40/41 NOT DETECTED NOT DETECTED Final   Astrovirus DETECTED (A) NOT DETECTED Final   Norovirus GI/GII NOT DETECTED NOT DETECTED Final   Rotavirus A NOT DETECTED NOT DETECTED Final   Sapovirus (I, II, IV, and V) NOT DETECTED NOT DETECTED Final    Comment: Performed at [facility], [address] [place], [place] [address]    Labs: CBC: Recent Labs  Lab 02/12/24 1116 02/13/24 0351  WBC 10.2 7.3  HGB 15.0 12.8*  HCT 46.5 40.3  MCV 87.9 87.4  PLT 201 164   Basic Metabolic Panel: Recent Labs  Lab 02/12/24 1116 02/13/24 0351 02/14/24 0432  NA 136 138 139  K 4.8 4.1 4.2  CL 98 101 105  CO2 22  21* 27  GLUCOSE 105* 57* 85  BUN 25* 29* 19  CREATININE 1.89* 1.59* 1.13  CALCIUM  8.9 8.2* 8.2*  MG 2.0  --  1.8  PHOS  --   --  2.9   Liver Function Tests: Recent Labs  Lab 02/12/24 1116 02/13/24 0351  AST 25 18  ALT 18 15  ALKPHOS 93 75  BILITOT 1.4* 1.0  PROT 7.2 6.4*  ALBUMIN 3.8 3.2*   CBG: Recent Labs  Lab 02/13/24 1204 02/13/24 1923 02/14/24 0019 02/14/24 0342 02/14/24 0745  GLUCAP 112* 123* 99 88 74    Discharge time spent: 35 minutes.  Signed: Murvin Mana, MD [facility] 02/14/2024

## 2024-02-14 NOTE — Plan of Care (Signed)

## 2024-02-14 NOTE — Progress Notes (Signed)
*  PRELIMINARY RESULTS* Echocardiogram 2D Echocardiogram has been performed.  Tony Lawrence 02/14/2024, 8:13 AM

## 2024-02-14 NOTE — Progress Notes (Signed)
*  PRELIMINARY RESULTS* Echocardiogram 2D Echocardiogram has been performed.  Tony Lawrence 02/14/2024, 8:14 AM

## 2024-02-14 NOTE — Telephone Encounter (Signed)
 PT stated he is being discharged from hospital and would like to know would DR. Adams write him a prescription for oxycodone to get him to his next appt with him for sleeping.

## 2024-02-15 ENCOUNTER — Telehealth: Payer: Self-pay

## 2024-02-15 LAB — GLUCOSE, CAPILLARY: Glucose-Capillary: 109 mg/dL — ABNORMAL HIGH (ref 70–99)

## 2024-02-15 NOTE — Transitions of Care (Post Inpatient/ED Visit) (Signed)
 02/15/2024  Name: Tony Lawrence MRN: [id number] DOB: 1945/05/18  Today's TOC FU Call Status: Today's TOC FU Call Status:: Successful TOC FU Call Completed TOC FU Call Complete Date: 02/15/24 Patient's Name and Date of Birth confirmed.  Transition Care Management Follow-up Telephone Call Date of Discharge: 02/14/24 Discharge Facility: [facility]) Type of Discharge: Inpatient Admission Primary Inpatient Discharge Diagnosis:: Acute kidney injury How have you been since you were released from the hospital?: Better Any questions or concerns?: No  Items Reviewed: Did you receive and understand the discharge instructions provided?: Yes Medications obtained,verified, and reconciled?: Yes (Medications Reviewed) Any new allergies since your discharge?: No Dietary orders reviewed?: Yes Type of Diet Ordered:: low salt heart healthy Do you have support at home?: Yes People in Home [RPT]: spouse Name of Support/Comfort Primary Source: Cathlean Greener  Medications Reviewed Today: Medications Reviewed Today     Reviewed by Ruweyda Macknight E, RN (Registered Nurse) on 02/15/24 at 1253  Med List Status: <None>   Medication Order Taking? Sig Documenting Provider Last Dose Status Informant  acetaminophen  (TYLENOL ) 500 MG tablet [id number]  Take 500 mg by mouth every 6 (six) hours as needed.  Patient not taking: Reported on 02/15/2024   [provider]  Active Spouse/Significant Other  atorvastatin  (LIPITOR) 20 MG tablet [id number] Yes Take 1 tablet (20 mg total) by mouth daily. Gretel App, NP  Active Spouse/Significant Other  clopidogrel  (PLAVIX ) 75 MG tablet [id number] Yes TAKE 1 TABLET BY MOUTH DAILY End, Lonni, MD  Active Spouse/Significant Other  co-enzyme Q-10 30 MG capsule [id number] Yes Take 30 mg by mouth daily. [provider]  Active Spouse/Significant Other  Cyanocobalamin  1000 MCG/ML LIQD [id number]  Take 1 drop by mouth.  Patient not  taking: Reported on 02/15/2024   [provider]  Active Spouse/Significant Other  glimepiride  (AMARYL ) 2 MG tablet [id number] Yes Take 2 mg by mouth daily. [provider]  Active Spouse/Significant Other  glucose blood test strip [id number] Yes E 11.9 qd Once Daily one touch McLean-Scocuzza, Randine SAILOR, MD  Active Spouse/Significant Other  isosorbide  mononitrate (IMDUR ) 30 MG 24 hr tablet [id number] Yes TAKE 1 TABLET BY MOUTH EVERY MORNING AND TAKE TWO TABLETS BY MOUTH EVERY EVENING End, Christopher, MD  Active Spouse/Significant Other  levothyroxine  (SYNTHROID ) 200 MCG tablet [id number] Yes Take 1 tablet (200 mcg total) by mouth daily. In am on empty stomach Gretel App, NP  Active Spouse/Significant Other  liothyronine  (CYTOMEL ) 5 MCG tablet [id number] Yes Take 0.5 tablets (2.5 mcg total) by mouth every other day. Gretel App, NP  Active Spouse/Significant Other  losartan  (COZAAR ) 50 MG tablet [id number] Yes Take 0.5 tablets (25 mg total) by mouth in the morning and at bedtime. End, Lonni, MD  Active Spouse/Significant Other  Multiple Vitamins-Minerals (CENTRUM SILVER 50+MEN PO) [id number] Yes Take 1 tablet by mouth every morning.  [provider]  Active Spouse/Significant Other  OneTouch Delica Lancets 33G MISC [id number] Yes Check blood sugars twice weekly Hope Merle, MD  Active Spouse/Significant Other  pantoprazole  (PROTONIX ) 40 MG tablet [id number] Yes Take 1 tablet (40 mg total) by mouth daily. Gretel App, NP  Active Spouse/Significant Other  RYBELSUS  7 MG TABS [id number] Yes Take 1 tablet by mouth daily. [provider]  Active Spouse/Significant Other            Home Care and Equipment/Supplies: Were Home Health Services Ordered?: NA Any new equipment or medical supplies ordered?: NA  Functional Questionnaire: Do you need assistance with  bathing/showering or dressing?: No Do you need assistance with meal preparation?: No Do you need assistance  with eating?: No Do you have difficulty maintaining continence: No Do you need assistance with getting out of bed/getting out of a chair/moving?: No Do you have difficulty managing or taking your medications?: No  Follow up appointments reviewed: PCP Follow-up appointment confirmed?: Yes Date of PCP follow-up appointment?: 02/21/24 Follow-up Provider: Rollene Dineen Driscilla Lionel Follow-up appointment confirmed?: NA Do you need transportation to your follow-up appointment?: No Do you understand care options if your condition(s) worsen?: Yes-patient verbalized understanding  SDOH Interventions Today    Flowsheet Row Most Recent Value  SDOH Interventions   Food Insecurity Interventions Intervention Not Indicated  Housing Interventions Intervention Not Indicated  Transportation Interventions Intervention Not Indicated  Utilities Interventions Intervention Not Indicated   Discussed and offered 30 day TOC program.  Patient  declined.  The patient has been provided with contact information for the care management team and has been advised to call with any health -related questions or concerns.  The patient verbalized understanding with current plan of care.  The patient is directed to their insurance card regarding availability of benefits coverage.    Arvin Seip RN, BSN, CCM [employer], Population Health Case Manager Phone: [phone]

## 2024-02-15 NOTE — Patient Instructions (Signed)
 Visit Information  Thank you for taking time to visit with me today. Please don't hesitate to contact me if I can be of assistance to you.  Patient instructions: notify provider for any new/ ongoing symptoms. take his medications as prescribed.  seek emergency medical services for severe symptoms such as SOB, chest pain.    Patient verbalizes understanding of instructions and care plan provided today and agrees to view in MyChart. Active MyChart status and patient understanding of how to access instructions and care plan via MyChart confirmed with patient.     The patient has been provided with contact information for the care management team and has been advised to call with any health related questions or concerns.   Please call the care guide team at [phone] if you need to cancel or reschedule your appointment.   Please call the Suicide and Crisis Lifeline: 988 call the [place]  National Suicide Prevention Lifeline: (970)457-3700 or TTY: (269) 153-6039 TTY 864-782-1716) to talk to a trained counselor call 1-800-273-TALK (toll free, 24 hour hotline) if you are experiencing a Mental Health or Behavioral Health Crisis or need someone to talk to.  Arvin Seip RN, BSN, CCM [employer], Population Health Case Manager Phone: [phone]

## 2024-02-20 ENCOUNTER — Encounter: Payer: Self-pay | Admitting: Nurse Practitioner

## 2024-02-20 ENCOUNTER — Inpatient Hospital Stay (HOSPITAL_BASED_OUTPATIENT_CLINIC_OR_DEPARTMENT_OTHER): Admitting: Nurse Practitioner

## 2024-02-20 VITALS — BP 121/62 | HR 83 | Temp 97.3°F | Resp 18 | Ht 70.0 in | Wt 225.0 lb

## 2024-02-20 DIAGNOSIS — Z79899 Other long term (current) drug therapy: Secondary | ICD-10-CM

## 2024-02-20 DIAGNOSIS — R29898 Other symptoms and signs involving the musculoskeletal system: Secondary | ICD-10-CM | POA: Diagnosis not present

## 2024-02-20 DIAGNOSIS — M47816 Spondylosis without myelopathy or radiculopathy, lumbar region: Secondary | ICD-10-CM | POA: Diagnosis not present

## 2024-02-20 DIAGNOSIS — M5136 Other intervertebral disc degeneration, lumbar region with discogenic back pain only: Secondary | ICD-10-CM | POA: Diagnosis not present

## 2024-02-20 DIAGNOSIS — M48062 Spinal stenosis, lumbar region with neurogenic claudication: Secondary | ICD-10-CM

## 2024-02-20 MED ORDER — TRAMADOL HCL 50 MG PO TABS: 50.0000 mg | ORAL_TABLET | Freq: Four times a day (QID) | ORAL | 0 refills | Status: AC | PRN

## 2024-02-20 NOTE — Progress Notes (Signed)
 PROVIDER NOTE: Interpretation of information contained herein should be left to medically-trained personnel. Specific patient instructions are provided elsewhere under Patient Instructions section of medical record. This document was created in part using AI and STT-dictation technology, any transcriptional errors that may result from this process are unintentional.  Patient: Tony Lawrence  Service: E/M   PCP: Gretel App, NP  DOB: 1945/07/03  DOS: 02/20/2024  Provider: Emmy MARLA Blanch, NP  MRN: [id number]  Delivery: Face-to-face  Specialty: Interventional Pain Management  Type: Established Patient  Setting: Ambulatory outpatient facility  Specialty designation: 09  Referring Prov.: Gretel App, NP  Location: Outpatient office facility       History of present illness (HPI) Tony Lawrence, a 78 y.o. 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 Tony Lawrence primary complain today is Back Pain (Left, lower (sciatica))  Pain Assessment: Severity of Chronic pain is reported as a 6 /10. Location: Back Left, Lower/left upper leg. Onset: More than a month ago. Quality: Throbbing, Aching, Shooting. Timing: Constant. Modifying factor(s): sitting, movement. Vitals:  height is 5' 10 (1.778 m) and weight is 225 lb (102.1 kg). His temporal temperature is 97.3 F (36.3 C) (abnormal). His blood pressure is 121/62 and his pulse is 83. His respiration is 18 and oxygen saturation is 99%.  BMI: Estimated body mass index is 32.28 kg/m as calculated from the following:   Height as of this encounter: 5' 10 (1.778 m).   Weight as of this encounter: 225 lb (102.1 kg).  Last encounter: Visit date not found. Last procedure: Visit date not found.  Reason for encounter: follow-up evaluation and medication management.   Discussed the use of AI scribe software for clinical note transcription with the patient, who gave verbal consent to  proceed.  History of Present Illness   Tony Lawrence is a 78 year old male who presents with difficulty sleeping due to pain when lying flat.  The patient was last seen by Dr. Myra 01/2023 for sciatica pain, previously on right side, new to left side. Dr. Myra treated with PT and Tramadol . Has had injections at [facility] none by Dr. Myra.   He experiences significant pain when lying flat, which has been affecting his ability to sleep. He has been using leftover oxycodone 5 mg tablets from a previous surgery earlier this year, taking two tablets at bedtime to manage the pain. However, he has run out of oxycodone and is seeking an alternative to help him sleep until his upcoming appointment next week.  He recalls being prescribed tramadol  last year but does not remember the specifics of the dosage or how effective it was. He has been taking oxycodone for about a week prior to running out. No side effects from tramadol  in the past.     Pharmacotherapy Assessment   Tramadol  50 mg every 6 hours as needed for 7 days. Monitoring: [place] PMP: PDMP not reviewed this encounter.       Pharmacotherapy: No side-effects or adverse reactions reported. Compliance: No problems identified. Effectiveness: Clinically acceptable.  No notes on file  UDS:  No results found for: SUMMARY  No results found for: CBDTHCR No results found for: D8THCCBX No results found for: D9THCCBX  ROS  Constitutional: Denies any fever or chills Gastrointestinal: No reported hemesis, hematochezia, vomiting, or acute GI distress Musculoskeletal: Low back pain (bilateral) Neurological: No reported episodes of acute onset apraxia, aphasia, dysarthria, agnosia, amnesia, paralysis,  loss of coordination, or loss of consciousness  Medication Review  Cyanocobalamin , Multiple Vitamins-Minerals, OneTouch Delica Lancets 33G, Semaglutide , acetaminophen , atorvastatin , clopidogrel , co-enzyme Q-10, glimepiride , glucose  blood, isosorbide  mononitrate, levothyroxine , liothyronine , losartan , pantoprazole , and traMADol   History Review  Allergy: Tony Lawrence is allergic to penicillins. Drug: Tony Lawrence  reports no history of drug use. Alcohol:  reports no history of alcohol use. Tobacco:  reports that he has never smoked. He has never used smokeless tobacco. Social: Tony Lawrence  reports that he has never smoked. He has never used smokeless tobacco. He reports that he does not drink alcohol and does not use drugs. 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) (11/04/2015), Cataract, Chronic diarrhea (12/29/2021), Complication of anesthesia, Contusion of toenail (02/27/2021), Coronary artery disease, COVID-19, Diabetes mellitus (HCC) (03/12/2013), Diabetes mellitus without complication (HCC) (2000), Diarrhea (03/21/2018), Erectile dysfunction due to arterial insufficiency (04/24/2019), Essential hypertension (12/22/2017), Flank pain (11/30/2013), GERD (gastroesophageal reflux disease), H/O total knee replacement, left (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), History of chicken pox, History of shingles, History of skin cancer (03/21/2018), History of tongue cancer (03/21/2018), HLD (hyperlipidemia) (03/28/2022), Hyperlipidemia, Hypertension, Hypothyroidism, Increased frequency of urination (03/12/2013), Malignant neoplasm of tongue, tip and lateral border (HCC) (11/04/2015), Mitral valve insufficiency (12/22/2019), Neuritis or radiculitis due to rupture of lumbar intervertebral disc (01/16/2014),  Onychomycosis (02/27/2021), OSA on CPAP, Other intervertebral disc displacement, lumbar region (01/16/2014), Parapelvic renal cyst (12/18/2013), Primary osteoarthritis of both knees (05/13/2014), Primary osteoarthritis of left knee (11/22/2018), SCC (squamous cell carcinoma), Sleep apnea (1992), Slowing of urinary stream (03/12/2013), Squamous cell carcinoma in situ, Squamous cell carcinoma in situ, Stroke (HCC), Thrombocytopenia (01/27/2022), Tongue cancer (HCC), Tongue cancer (HCC), Urinary hesitancy (03/12/2013), and Valvular heart disease (03/23/2021). Surgical: Tony Lawrence  has a past surgical history that includes Colonoscopy (05/2006); Biopsy tongue; Cardiac catheterization (N/A, 11/10/2015); Excision of tongue lesion (N/A, 11/11/2015); Colonoscopy with propofol  (N/A, 05/24/2017); Replacement total knee (Left); right tka minimally invasive 05/06/20 Dr. Joyce Grater; cancer of tongue (08/2020); Replacement total knee (Right, 04/2020); Mouth surgery (04/2023); Vasectomy (1977); and Joint replacement (2022). Family: family history includes Alcohol abuse in his father; Arthritis in his mother; COPD in his father; Depression in his father; Early death in his maternal grandfather; Emphysema in his father; Heart disease in his maternal grandfather; Myelodysplastic syndrome in his mother; Stroke in his maternal grandfather.  Laboratory Chemistry Profile   Renal Lab Results  Component Value Date   BUN 19 02/14/2024   CREATININE 1.13 02/14/2024   LABCREA 58 01/25/2022   BCR NOT APPLICABLE 12/10/2020   GFR 58.24 (L) 02/14/2023   GFRAA >60 01/05/2018   GFRNONAA >60 02/14/2024    Hepatic Lab Results  Component Value Date   AST 18 02/13/2024   ALT 15 02/13/2024   ALBUMIN 3.2 (L) 02/13/2024   ALKPHOS 75 02/13/2024   HCVAB NON REACTIVE 08/09/2022   LIPASE 24 02/12/2024    Electrolytes Lab Results  Component Value Date   NA 139 02/14/2024   K 4.2 02/14/2024   CL 105 02/14/2024   CALCIUM  8.2  (L) 02/14/2024   MG 1.8 02/14/2024   PHOS 2.9 02/14/2024    Bone Lab Results  Component Value Date   VD25OH  46.73 08/17/2018    Inflammation (CRP: Acute Phase) (ESR: Chronic Phase) Lab Results  Component Value Date   LATICACIDVEN 1.9 02/12/2024         Note: Above Lab results reviewed.  Recent Imaging Review  ECHOCARDIOGRAM COMPLETE    ECHOCARDIOGRAM REPORT       Patient Name:   WEN MERCED Date of Exam: 02/14/2024 Medical Rec #:  [id number]       Height:       70.0 in Accession #:    [id number]      Weight:       230.0 lb Date of Birth:  06/11/1945       BSA:          2.215 m Patient Age:    78 years        BP:           160/57 mmHg Patient Gender: M               HR:           58 bpm. Exam Location:  [facility]  Procedure: 2D Echo, Cardiac Doppler and Color Doppler (Both Spectral and Color            Flow Doppler were utilized during procedure).  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 Risk Factors:Diabetes and Hypertension.   Sonographer:     Christopher Furnace Referring Phys:  [id number] PRENTICE BROCKS CORE Diagnosing Phys: Lonni End MD    Sonographer Comments: Technically challenging study due to limited acoustic windows, no parasternal window and no subcostal window. IMPRESSIONS   1. Left ventricular ejection fraction, by estimation, is 55 to 60%. The left ventricle has normal function. Left ventricular endocardial border not optimally defined to evaluate regional wall motion. There is mild left ventricular hypertrophy. Left  ventricular diastolic parameters are consistent with Grade I diastolic dysfunction (impaired relaxation).  2. Right ventricular systolic function is normal. The right ventricular size is normal.  3. Right atrial size was mildly dilated.  4. The mitral valve is degenerative. No evidence of mitral valve regurgitation. No  evidence of mitral stenosis. Moderate mitral annular calcification.  5. The aortic valve was not well visualized. There is mild calcification of the aortic valve. There is mild thickening of the aortic valve. Aortic valve regurgitation is trivial.  6. Aortic dilatation noted. There is borderline dilatation of the aortic root.  FINDINGS  Left Ventricle: Left ventricular ejection fraction, by estimation, is 55 to 60%. The left ventricle has normal function. Left ventricular endocardial border not optimally defined to evaluate regional wall motion. The left ventricular internal cavity  size was normal in size. There is mild left ventricular hypertrophy. Left ventricular diastolic parameters are consistent with Grade I diastolic dysfunction (impaired relaxation).  Right Ventricle: The right ventricular size is normal. No increase in right ventricular wall thickness. Right ventricular systolic function is normal.  Left Atrium: Left atrial size was normal in size.  Right Atrium: Right atrial size was mildly dilated.  Pericardium: The pericardium was not well visualized.  Mitral Valve: The mitral valve is degenerative in appearance. There is mild thickening of the mitral valve leaflet(s). There is mild calcification of the mitral valve leaflet(s). Moderate mitral annular calcification. No evidence of  mitral valve  regurgitation. No evidence of mitral valve stenosis. MV peak gradient, 9.1 mmHg. The mean mitral valve gradient is 4.0 mmHg.  Tricuspid Valve: The tricuspid valve is not well visualized. Tricuspid valve regurgitation is trivial.  Aortic Valve: The aortic valve was not well visualized. There is mild calcification of the aortic valve. There is mild thickening of the aortic valve. Aortic valve regurgitation is trivial. Aortic valve mean gradient measures 6.0 mmHg. Aortic valve peak  gradient measures 10.6 mmHg. Aortic valve area, by VTI measures 2.63 cm.  Pulmonic Valve: The pulmonic valve  was not well visualized.  Aorta: Aortic dilatation noted. There is borderline dilatation of the aortic root.  Pulmonary Artery: The pulmonary artery is not well seen.  IAS/Shunts: The interatrial septum was not well visualized.    LEFT VENTRICLE PLAX 2D LVIDd:         4.40 cm   Diastology LVIDs:         2.80 cm   LV e' medial:    6.64 cm/s LV PW:         1.30 cm   LV E/e' medial:  14.9 LV IVS:        1.30 cm   LV e' lateral:   33.50 cm/s LVOT diam:     2.30 cm   LV E/e' lateral: 3.0 LV SV:         96 LV SV Index:   43 LVOT Area:     4.15 cm    RIGHT VENTRICLE RV Basal diam:  4.00 cm RV Mid diam:    3.70 cm RV S prime:     11.00 cm/s TAPSE (M-mode): 2.8 cm  LEFT ATRIUM             Index        RIGHT ATRIUM           Index LA diam:        5.10 cm 2.30 cm/m   RA Area:     20.90 cm LA Vol (A2C):   55.6 ml 25.10 ml/m  RA Volume:   60.30 ml  27.22 ml/m LA Vol (A4C):   52.1 ml 23.52 ml/m LA Biplane Vol: 56.0 ml 25.28 ml/m  AORTIC VALVE AV Area (Vmax):    2.30 cm AV Area (Vmean):   2.44 cm AV Area (VTI):     2.63 cm AV Vmax:           163.00 cm/s AV Vmean:          108.000 cm/s AV VTI:            0.363 m AV Peak Grad:      10.6 mmHg AV Mean Grad:      6.0 mmHg LVOT Vmax:         90.20 cm/s LVOT Vmean:        63.400 cm/s LVOT VTI:          0.230 m LVOT/AV VTI ratio: 0.63   AORTA Ao Root diam: 3.80 cm  MITRAL VALVE                TRICUSPID VALVE MV Area (PHT): 3.26 cm     TR Peak grad:   32.9 mmHg MV Area VTI:   1.90 cm     TR Vmax:        287.00 cm/s MV Peak grad:  9.1 mmHg MV Mean grad:  4.0 mmHg     SHUNTS MV Vmax:  1.51 m/s     Systemic VTI:  0.23 m MV Vmean:      87.1 cm/s    Systemic Diam: 2.30 cm MV Decel Time: 233 msec MV E velocity: 99.00 cm/s MV A velocity: 140.00 cm/s MV E/A ratio:  0.71  Christopher End MD Electronically signed by Lonni Hanson MD Signature Date/Time: 02/14/2024/7:30:39 PM      Final   Note: Reviewed         Physical Exam  Vitals: BP 121/62 (Cuff Size: Large)   Pulse 83   Temp (!) 97.3 F (36.3 C) (Temporal)   Resp 18   Ht 5' 10 (1.778 m)   Wt 225 lb (102.1 kg)   SpO2 99%   BMI 32.28 kg/m  BMI: Estimated body mass index is 32.28 kg/m as calculated from the following:   Height as of this encounter: 5' 10 (1.778 m).   Weight as of this encounter: 225 lb (102.1 kg). Ideal: Ideal body weight: 73 kg (160 lb 15 oz) Adjusted ideal body weight: 84.6 kg (186 lb 9 oz) General appearance: Well nourished, well developed, and well hydrated. In no apparent acute distress Mental status: Alert, oriented x 3 (person, place, & time)       Respiratory: No evidence of acute respiratory distress Eyes: PERLA  Musculoskeletal: +LBP Assessment   Diagnosis Status  1. Spinal stenosis of lumbar region with neurogenic claudication   2. Weakness of right leg   3. Degeneration of intervertebral disc of lumbar region with discogenic back pain   4. Facet arthritis of lumbar region   5. Medication management    Controlled Controlled Controlled   Updated Problems: No problems updated.  Plan of Care  Problem-specific:  Assessment and Plan    Chronic pain syndrome: Chronic pain exacerbated by lying flat, affecting sleep. Previously managed with oxycodone, currently without medication. No adverse effects from tramadol  previously. - Prescribed tramadol  50 mg every six hours for seven days. - Sent prescription to [facility], [place]. - Scheduled follow-up with Dr. Myra next week for further evaluation and management.  The patient has previously taken tramadol  prescribed by Dr. Myra and was referred for physical therapy.  He would like to continue physical therapy again at [facility] in [place]. Prescribing drug monitoring PMP reviewed.       Tony Lawrence has a current medication list which includes the following long-term medication(s): atorvastatin , glimepiride ,  isosorbide  mononitrate, levothyroxine , liothyronine , losartan , and pantoprazole .  Pharmacotherapy (Medications Ordered): Meds ordered this encounter  Medications   traMADol  (ULTRAM ) 50 MG tablet    Sig: Take 1 tablet (50 mg total) by mouth every 6 (six) hours as needed for up to 7 days for severe pain (pain score 7-10).    Dispense:  28 tablet    Refill:  0    Fill one day early if pharmacy is closed on scheduled refill date. Do not fill until: To last until:   Orders:  Orders Placed This Encounter  Procedures   Ambulatory referral to Physical Therapy    Referral Priority:   Routine    Referral Type:   Physical Medicine    Referral Reason:   Specialty Services Required    Requested Specialty:   Physical Therapy    Number of Visits Requested:   1        Return in about 1 week (around 02/27/2024) for (F2F), (MM) with Dr. Myra .    Recent Visits No visits were found meeting  these conditions. Showing recent visits within past 90 days and meeting all other requirements Today's Visits Date Type Provider Dept  02/20/24 Office Visit Jordanne Elsbury K, NP [facility]  Showing today's visits and meeting all other requirements Future Appointments Date Type Provider Dept  02/28/24 Appointment Myra Lynwood MATSU, MD [facility]  Showing future appointments within next 90 days and meeting all other requirements  I discussed the assessment and treatment plan with the patient. The patient was provided an opportunity to ask questions and all were answered. The patient agreed with the plan and demonstrated an understanding of the instructions.  Patient advised to call back or seek an in-person evaluation if the symptoms or condition worsens.  I personally spent a total of 30 minutes in the care of the patient today including preparing to see the patient, getting/reviewing separately obtained history, performing a medically appropriate exam/evaluation, counseling and educating,  placing orders, referring and communicating with other health care professionals, documenting clinical information in the EHR, independently interpreting results, communicating results, and coordinating care.   Note by: Kanetra Ho K Shaylinn Hladik, NP (TTS and AI technology used. I apologize for any typographical errors that were not detected and corrected.) Date: 02/20/2024; Time: 2:47 PM

## 2024-02-21 ENCOUNTER — Inpatient Hospital Stay: Admitting: Family

## 2024-02-21 ENCOUNTER — Ambulatory Visit: Admitting: Internal Medicine

## 2024-02-22 ENCOUNTER — Ambulatory Visit (INDEPENDENT_AMBULATORY_CARE_PROVIDER_SITE_OTHER): Admitting: Nurse Practitioner

## 2024-02-22 ENCOUNTER — Encounter: Payer: Self-pay | Admitting: Nurse Practitioner

## 2024-02-22 VITALS — BP 126/60 | HR 81 | Temp 98.2°F | Ht 70.0 in | Wt 229.6 lb

## 2024-02-22 DIAGNOSIS — Z87448 Personal history of other diseases of urinary system: Secondary | ICD-10-CM | POA: Diagnosis not present

## 2024-02-22 DIAGNOSIS — E1129 Type 2 diabetes mellitus with other diabetic kidney complication: Secondary | ICD-10-CM | POA: Diagnosis not present

## 2024-02-22 DIAGNOSIS — Z7984 Long term (current) use of oral hypoglycemic drugs: Secondary | ICD-10-CM

## 2024-02-22 DIAGNOSIS — A0832 Astrovirus enteritis: Secondary | ICD-10-CM

## 2024-02-22 DIAGNOSIS — M48062 Spinal stenosis, lumbar region with neurogenic claudication: Secondary | ICD-10-CM | POA: Diagnosis not present

## 2024-02-22 DIAGNOSIS — Z09 Encounter for follow-up examination after completed treatment for conditions other than malignant neoplasm: Secondary | ICD-10-CM | POA: Insufficient documentation

## 2024-02-22 DIAGNOSIS — Z8619 Personal history of other infectious and parasitic diseases: Secondary | ICD-10-CM | POA: Diagnosis not present

## 2024-02-22 DIAGNOSIS — R809 Proteinuria, unspecified: Secondary | ICD-10-CM

## 2024-02-22 DIAGNOSIS — N179 Acute kidney failure, unspecified: Secondary | ICD-10-CM

## 2024-02-22 NOTE — Assessment & Plan Note (Signed)
 Well-controlled with current medication regimen. Managed by Endocrinology. Fasting blood glucose levels in the 90s. Continue glimepiride  and Rybelsus  as prescribed. Follow up with endo as scheduled.

## 2024-02-22 NOTE — Assessment & Plan Note (Addendum)
 Resolved. Asymptomatic. Tolerating oral intake. Return precautions given to patient.

## 2024-02-22 NOTE — Progress Notes (Signed)
 Leron Glance, NP-C Phone: [phone]  Tony Lawrence is a 78 y.o. male who presents today for hospital follow up.   Discussed the use of AI scribe software for clinical note transcription with the patient, who gave verbal consent to proceed.  History of Present Illness   Tony Lawrence is a 78 year old male with diabetes who presents for a hospital follow-up after an episode of acute kidney injury.  He was hospitalized on October 19th for two days due to nausea, vomiting, and diarrhea, which led to severe dehydration and an acute kidney injury. His blood pressure dropped to 80/40 mmHg. He received IV fluids during his hospital stay, which improved his condition, and his kidney function returned to normal before discharge. The diarrhea persisted for a couple of days post-discharge but has since resolved.  Prior to this hospitalization, he experienced a similar episode of nausea, vomiting, and diarrhea about two weeks earlier, lasting four days. The second episode was more severe. He was diagnosed with astrovirus during his hospital stay.  He has a history of diabetes and is currently taking glimepiride  and Rybelsus . He recently switched to Rybelsus , which he initially suspected might have contributed to his symptoms, but his diabetes remains well-controlled with fasting blood glucose levels in the 90s.  He reports issues with his sciatic nerve and has been experiencing weakness in his legs due to spinal stenosis in his lower back. He is currently taking tramadol  at night to help with sleep and tries to avoid taking it during the day.  No current nausea, vomiting, or diarrhea. He is tolerating oral fluids well, eating normally, and urinating without issues.      Social History   Tobacco Use  Smoking Status Never  Smokeless Tobacco Never    Current Outpatient Medications on File Prior to Visit  Medication Sig Dispense Refill   atorvastatin  (LIPITOR) 20 MG tablet Take 1 tablet (20 mg  total) by mouth daily. 90 tablet 3   clopidogrel  (PLAVIX ) 75 MG tablet TAKE 1 TABLET BY MOUTH DAILY 90 tablet 3   co-enzyme Q-10 30 MG capsule Take 30 mg by mouth daily.     glimepiride  (AMARYL ) 2 MG tablet Take 2 mg by mouth daily.     glucose blood test strip E 11.9 qd Once Daily one touch 100 each 12   isosorbide  mononitrate (IMDUR ) 30 MG 24 hr tablet TAKE 1 TABLET BY MOUTH EVERY MORNING AND TAKE TWO TABLETS BY MOUTH EVERY EVENING 270 tablet 3   levothyroxine  (SYNTHROID ) 200 MCG tablet Take 1 tablet (200 mcg total) by mouth daily. In am on empty stomach 90 tablet 3   liothyronine  (CYTOMEL ) 5 MCG tablet Take 0.5 tablets (2.5 mcg total) by mouth every other day. 45 tablet 3   losartan  (COZAAR ) 50 MG tablet Take 0.5 tablets (25 mg total) by mouth in the morning and at bedtime. 90 tablet 3   Multiple Vitamins-Minerals (CENTRUM SILVER 50+MEN PO) Take 1 tablet by mouth every morning.      OneTouch Delica Lancets 33G MISC Check blood sugars twice weekly 100 each 1   pantoprazole  (PROTONIX ) 40 MG tablet Take 1 tablet (40 mg total) by mouth daily. 90 tablet 3   RYBELSUS  7 MG TABS Take 1 tablet by mouth daily.     traMADol  (ULTRAM ) 50 MG tablet Take 1 tablet (50 mg total) by mouth every 6 (six) hours as needed for up to 7 days for severe pain (pain score 7-10). 28 tablet 0  No current facility-administered medications on file prior to visit.     ROS see history of present illness  Objective  Physical Exam Vitals:   02/22/24 1121  BP: 126/60  Pulse: 81  Temp: 98.2 F (36.8 C)  SpO2: 96%    BP Readings from Last 3 Encounters:  02/22/24 126/60  02/20/24 121/62  02/14/24 (!) 142/58   Wt Readings from Last 3 Encounters:  02/22/24 229 lb 9.6 oz (104.1 kg)  02/20/24 225 lb (102.1 kg)  02/12/24 230 lb (104.3 kg)    Physical Exam Constitutional:      General: He is not in acute distress.    Appearance: Normal appearance.  HENT:     Head: Normocephalic.  Cardiovascular:     Rate  and Rhythm: Normal rate and regular rhythm.     Heart sounds: Normal heart sounds.  Pulmonary:     Effort: Pulmonary effort is normal.     Breath sounds: Normal breath sounds.  Skin:    General: Skin is warm and dry.  Neurological:     General: No focal deficit present.     Mental Status: He is alert.  Psychiatric:        Mood and Affect: Mood normal.        Behavior: Behavior normal.      Assessment/Plan: Please see individual problem list.  AKI (acute kidney injury) Assessment & Plan: Resolved after IV fluid administration. Kidney function returned to normal.    Astrovirus gastroenteritis Assessment & Plan: Resolved. Asymptomatic. Tolerating oral intake. Return precautions given to patient.    Hospital discharge follow-up Assessment & Plan: Discharge summary, H&P, and labs reviewed today. Patient's symptoms have resolved. Kidney function returned to normal. No questions or concerns at this time. Tolerating oral intake. Return precautions given to patient.    Type 2 diabetes mellitus with diabetic microalbuminuria, without long-term current use of insulin  (HCC) Assessment & Plan: Well-controlled with current medication regimen. Managed by Endocrinology. Fasting blood glucose levels in the 90s. Continue glimepiride  and Rybelsus  as prescribed. Follow up with endo as scheduled.    Spinal stenosis of lumbar region with neurogenic claudication Assessment & Plan: Chronic condition with leg weakness. Recently evaluated by pain management. Continue tramadol  at night for pain management. Follow up with pain management specialist next week.       Return for as scheduled.   Leron Glance, NP-C [facility]

## 2024-02-22 NOTE — Assessment & Plan Note (Signed)
 Chronic condition with leg weakness. Recently evaluated by pain management. Continue tramadol  at night for pain management. Follow up with pain management specialist next week.

## 2024-02-22 NOTE — Assessment & Plan Note (Signed)
 Resolved after IV fluid administration. Kidney function returned to normal.

## 2024-02-22 NOTE — Assessment & Plan Note (Signed)
 Discharge summary, H&P, and labs reviewed today. Patient's symptoms have resolved. Kidney function returned to normal. No questions or concerns at this time. Tolerating oral intake. Return precautions given to patient.

## 2024-02-28 ENCOUNTER — Encounter: Payer: Self-pay | Admitting: Anesthesiology

## 2024-02-28 ENCOUNTER — Ambulatory Visit: Attending: Anesthesiology | Admitting: Anesthesiology

## 2024-02-28 VITALS — BP 145/72 | HR 95 | Temp 97.7°F | Resp 16 | Ht 70.0 in | Wt 230.0 lb

## 2024-02-28 DIAGNOSIS — M47816 Spondylosis without myelopathy or radiculopathy, lumbar region: Secondary | ICD-10-CM | POA: Diagnosis present

## 2024-02-28 DIAGNOSIS — F1190 Opioid use, unspecified, uncomplicated: Secondary | ICD-10-CM | POA: Diagnosis present

## 2024-02-28 DIAGNOSIS — M5136 Other intervertebral disc degeneration, lumbar region with discogenic back pain only: Secondary | ICD-10-CM | POA: Diagnosis present

## 2024-02-28 DIAGNOSIS — R29898 Other symptoms and signs involving the musculoskeletal system: Secondary | ICD-10-CM | POA: Diagnosis present

## 2024-02-28 DIAGNOSIS — G894 Chronic pain syndrome: Secondary | ICD-10-CM | POA: Insufficient documentation

## 2024-02-28 DIAGNOSIS — M48062 Spinal stenosis, lumbar region with neurogenic claudication: Secondary | ICD-10-CM | POA: Insufficient documentation

## 2024-02-28 DIAGNOSIS — Z79899 Other long term (current) drug therapy: Secondary | ICD-10-CM | POA: Insufficient documentation

## 2024-02-28 MED ORDER — TRAMADOL HCL 50 MG PO TABS: 100.0000 mg | ORAL_TABLET | Freq: Two times a day (BID) | ORAL | 1 refills | Status: AC

## 2024-03-02 LAB — TOXASSURE SELECT 13 (MW), URINE

## 2024-03-02 NOTE — Telephone Encounter (Signed)
 I spoke with patient. He will bring the SD card when he comes to his appt.  Nothing further needed.

## 2024-03-05 ENCOUNTER — Ambulatory Visit: Admitting: Sleep Medicine

## 2024-03-05 VITALS — BP 148/72 | HR 102 | Resp 16 | Ht 70.0 in | Wt 226.0 lb

## 2024-03-05 DIAGNOSIS — G4733 Obstructive sleep apnea (adult) (pediatric): Secondary | ICD-10-CM | POA: Diagnosis not present

## 2024-03-05 DIAGNOSIS — I10 Essential (primary) hypertension: Secondary | ICD-10-CM | POA: Diagnosis not present

## 2024-03-05 NOTE — Patient Instructions (Addendum)

## 2024-03-05 NOTE — Progress Notes (Signed)
 Name:Tony Lawrence MRN: [id number] DOB: 11/04/1945   CHIEF COMPLAINT:  CPAP F/U   HISTORY OF PRESENT ILLNESS: [name] is a 78 y.o. w/ a h/o OSA, CAD, GERD, morbid obesity and HTN who presents for CPAP follow up visit. Reports using CPAP therapy every night, which is confirmed by compliance data. He is currently using the Airfit N20 FFM, which is comfortable. States that air leaks have improved since using mouth tape. Reports feeling more refreshed upon awakening with CPAP therapy.    EPWORTH SLEEP SCORE     01/23/2024    2:00 PM  Results of the Epworth flowsheet  Sitting and reading 1  Watching TV 2  Sitting, inactive in a public place (e.g. a theatre or a meeting) 2  As a passenger in a car for an hour without a break 1  Lying down to rest in the afternoon when circumstances permit 3  Sitting and talking to someone 0  Sitting quietly after a lunch without alcohol 2  In a car, while stopped for a few minutes in traffic 0  Total score 11    PAST MEDICAL HISTORY :   has a past medical history of Actinic keratoses, Actinic keratoses, Allergy (1977), Aneurysm of ascending aorta without rupture (03/27/2022), Annual physical exam (02/27/2021), Aortic valve regurgitation (03/27/2022), Arthritis (1996), Arthritis of right knee (11/15/2019), BCC (basal cell carcinoma of skin), Benign prostatic hyperplasia with urinary obstruction (03/06/2012), CAD (coronary artery disease), CAD (coronary artery disease) (03/28/2022), 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) (11/04/2015), Cataract, Chronic diarrhea (12/29/2021), Complication of anesthesia, Contusion of toenail (02/27/2021), Coronary artery disease, COVID-19, Diabetes mellitus (HCC) (03/12/2013), Diabetes mellitus without complication (HCC) (2000), Diarrhea (03/21/2018), Erectile dysfunction due to arterial insufficiency (04/24/2019), Essential hypertension (12/22/2017), Flank pain (11/30/2013), GERD (gastroesophageal reflux  disease), H/O total knee replacement, left (11/15/2019), History of chicken pox, History of shingles, History of skin cancer (03/21/2018), History of tongue cancer (03/21/2018), HLD (hyperlipidemia) (03/28/2022), Hyperlipidemia, Hypertension, Hypothyroidism, Increased frequency of urination (03/12/2013), Malignant neoplasm of tongue, tip and lateral border (HCC) (11/04/2015), Mitral valve insufficiency (12/22/2019), Neuritis or radiculitis due to rupture of lumbar intervertebral disc (01/16/2014), Onychomycosis (02/27/2021), OSA on CPAP, Other intervertebral disc displacement, lumbar region (01/16/2014), Parapelvic renal cyst (12/18/2013), Primary osteoarthritis of both knees (05/13/2014), Primary osteoarthritis of left knee (11/22/2018), SCC (squamous cell carcinoma), Sleep apnea (1992), Slowing of urinary stream (03/12/2013), Squamous cell carcinoma in situ, Squamous cell carcinoma in situ, Stroke (HCC), Thrombocytopenia (01/27/2022), Tongue cancer (HCC), Tongue cancer (HCC), Urinary hesitancy (03/12/2013), and Valvular heart disease (03/23/2021).  has a past surgical history that includes Colonoscopy (05/2006); Biopsy tongue; Cardiac catheterization (N/A, 11/10/2015); Excision of tongue lesion (N/A, 11/11/2015); Colonoscopy with propofol  (N/A, 05/24/2017); Replacement total knee (Left); right tka minimally invasive 05/06/20 Dr. Joyce Grater; cancer of tongue (08/2020); Replacement total knee (Right, 04/2020); Mouth surgery (04/2023); Vasectomy (1977); and Joint replacement (2022). Prior to Admission medications   Medication Sig Start Date End Date Taking? Authorizing Provider  ibuprofen (ADVIL) 600 MG tablet Take 600 mg by mouth. 05/10/23  Yes [provider]  acetaminophen  (TYLENOL ) 500 MG tablet Take 500 mg by mouth every 6 (six) hours as needed.    [provider]  atorvastatin  (LIPITOR) 20 MG tablet Take 1 tablet (20 mg total) by mouth daily. 11/15/23   Gretel App, NP  atorvastatin   (LIPITOR) 20 MG tablet Take 1 tablet (20 mg total) by mouth daily. 11/14/23   Gretel App, NP  clopidogrel  (PLAVIX ) 75 MG tablet  TAKE 1 TABLET BY MOUTH DAILY 02/28/23   End, Lonni, MD  co-enzyme Q-10 30 MG capsule Take 30 mg by mouth 3 (three) times daily.    [provider]  Cyanocobalamin  1000 MCG/ML LIQD Take 1 drop by mouth.    [provider]  glimepiride  (AMARYL ) 2 MG tablet Take 2 mg by mouth daily.    [provider]  glucose blood test strip E 11.9 qd Once Daily one touch 09/25/21   McLean-Scocuzza, Randine SAILOR, MD  hydrochlorothiazide  (HYDRODIURIL ) 12.5 MG tablet TAKE 1 TABLET BY MOUTH DAILY 11/14/23   Darron Deatrice LABOR, MD  isosorbide  mononitrate (IMDUR ) 30 MG 24 hr tablet TAKE 1 TABLET BY MOUTH EVERY MORNING AND TAKE TWO TABLETS BY MOUTH EVERY EVENING 06/30/23   End, Lonni, MD  levothyroxine  (SYNTHROID ) 200 MCG tablet Take 1 tablet (200 mcg total) by mouth daily. In am on empty stomach 11/15/23   Gretel App, NP  liothyronine  (CYTOMEL ) 5 MCG tablet Take 0.5 tablets (2.5 mcg total) by mouth every other day. 11/15/23   Gretel App, NP  losartan  (COZAAR ) 50 MG tablet Take 0.5 tablets (25 mg total) by mouth in the morning and at bedtime. 03/28/23   End, Lonni, MD  Multiple Vitamins-Minerals (CENTRUM SILVER 50+MEN PO) Take 1 tablet by mouth every morning.     [provider]  OneTouch Delica Lancets 33G MISC Check blood sugars twice weekly 04/06/22   Hope Merle, MD  pantoprazole  (PROTONIX ) 40 MG tablet Take 1 tablet (40 mg total) by mouth daily. 11/15/23   Gretel App, NP  RYBELSUS  7 MG TABS Take 1 tablet by mouth daily.    [provider]   Allergies  Allergen Reactions   Penicillins Hives, Rash, Other (See Comments) and Swelling    Has patient had a PCN reaction causing immediate rash, facial/tongue/throat swelling, SOB or lightheadedness with hypotension: Yes Has patient had a PCN reaction causing severe rash involving mucus  membranes or skin necrosis: No Has patient had a PCN reaction that required hospitalization No Has patient had a PCN reaction occurring within the last 10 years: No If all of the above answers are NO, then may proceed with Cephalosporin use.     FAMILY HISTORY:  family history includes Alcohol abuse in his father; Arthritis in his mother; COPD in his father; Depression in his father; Early death in his maternal grandfather; Emphysema in his father; Heart disease in his maternal grandfather; Myelodysplastic syndrome in his mother; Stroke in his maternal grandfather. SOCIAL HISTORY:  reports that he has never smoked. He has never used smokeless tobacco. He reports that he does not drink alcohol and does not use drugs.   Review of Systems:  Gen:  Denies  fever, sweats, chills weight loss  HEENT: Denies blurred vision, double vision, ear pain, eye pain, hearing loss, nose bleeds, sore throat Cardiac:  No dizziness, chest pain or heaviness, chest tightness,edema, No JVD Resp:   No cough, -sputum production, -shortness of breath,-wheezing, -hemoptysis,  Gi: Denies swallowing difficulty, stomach pain, nausea or vomiting, diarrhea, constipation, bowel incontinence Gu:  Denies bladder incontinence, burning urine Ext:   Denies Joint pain, stiffness or swelling Skin: Denies  skin rash, easy bruising or bleeding or hives Endoc:  Denies polyuria, polydipsia , polyphagia or weight change Psych:   Denies depression, insomnia or hallucinations  Other:  All other systems negative  VITAL SIGNS: BP (!) 148/72   Pulse (!) 102   Resp 16   Ht 5' 10 (1.778 m)  Wt 226 lb (102.5 kg)   SpO2 98%   BMI 32.43 kg/m    Physical Examination:   General Appearance: No distress  EYES PERRLA, EOM intact.   NECK Supple, No JVD Pulmonary: normal breath sounds, No wheezing.  CardiovascularNormal S1,S2.  No m/r/g.   Abdomen: Benign, Soft, non-tender. Skin:   warm, no rashes, no ecchymosis  Extremities:  normal, no cyanosis, clubbing. Neuro:without focal findings,  speech normal  PSYCHIATRIC: Mood, affect within normal limits.   ASSESSMENT AND PLAN  OSA Patient is using and benefiting from CPAP therapy. Discussed the consequences of untreated sleep apnea. Advised not to drive drowsy for safety of patient and others. Will f/u in 1 year.    HTN BP borderline elevated. Advised patient to follow up with his PCP for further management.     Patient  satisfied with Plan of action and management. All questions answered  I spent a total of 23 minutes reviewing chart data, face-to-face evaluation with the patient, counseling and coordination of care as detailed above.    Kathrine Rieves, M.D.  Sleep Medicine [facility]

## 2024-03-05 NOTE — Progress Notes (Signed)
 Subjective:  Patient ID: Tony Lawrence, male    DOB: June 10, 1945  Age: 78 y.o. MRN: [id number]  CC: Back Pain (Left), Hip Pain (left), Leg Pain (Left), and Knee Pain (Left )   Procedure: None  HPI Tony Lawrence presents for reevaluation.  Tony Lawrence continues to try and stay active.  He is taking his tramadol  approximately 3-4 times a day and this continues to keep his pain under control and enable him to stay reasonably active.  The quality characteristic and distribution of the pain that he is having is stable with no recent changes noted.  He is doing well with the medication with no side effects reported.  Otherwise he is in his usual state of health as he reports today.  Outpatient Medications Prior to Visit  Medication Sig Dispense Refill   atorvastatin  (LIPITOR) 20 MG tablet Take 1 tablet (20 mg total) by mouth daily. 90 tablet 3   clopidogrel  (PLAVIX ) 75 MG tablet TAKE 1 TABLET BY MOUTH DAILY 90 tablet 3   co-enzyme Q-10 30 MG capsule Take 30 mg by mouth daily.     glimepiride  (AMARYL ) 2 MG tablet Take 2 mg by mouth daily.     glucose blood test strip E 11.9 qd Once Daily one touch 100 each 12   isosorbide  mononitrate (IMDUR ) 30 MG 24 hr tablet TAKE 1 TABLET BY MOUTH EVERY MORNING AND TAKE TWO TABLETS BY MOUTH EVERY EVENING 270 tablet 3   levothyroxine  (SYNTHROID ) 200 MCG tablet Take 1 tablet (200 mcg total) by mouth daily. In am on empty stomach 90 tablet 3   liothyronine  (CYTOMEL ) 5 MCG tablet Take 0.5 tablets (2.5 mcg total) by mouth every other day. 45 tablet 3   losartan  (COZAAR ) 50 MG tablet Take 0.5 tablets (25 mg total) by mouth in the morning and at bedtime. 90 tablet 3   Multiple Vitamins-Minerals (CENTRUM SILVER 50+MEN PO) Take 1 tablet by mouth every morning.      OneTouch Delica Lancets 33G MISC Check blood sugars twice weekly 100 each 1   pantoprazole  (PROTONIX ) 40 MG tablet Take 1 tablet (40 mg total) by mouth daily. 90 tablet 3   RYBELSUS  7 MG TABS Take 1 tablet by  mouth daily.     No facility-administered medications prior to visit.    Review of Systems CNS: No confusion or sedation Cardiac: No angina or palpitations GI: No abdominal pain or constipation Constitutional: No nausea vomiting fevers or chills  Objective:  BP (!) 145/72 (Patient Position: Sitting, Cuff Size: Normal)   Pulse 95   Temp 97.7 F (36.5 C) (Temporal)   Resp 16   Ht 5' 10 (1.778 m)   Wt 230 lb (104.3 kg)   SpO2 96%   BMI 33.00 kg/m    BP Readings from Last 3 Encounters:  02/28/24 (!) 145/72  02/22/24 126/60  02/20/24 121/62     Wt Readings from Last 3 Encounters:  02/28/24 230 lb (104.3 kg)  02/22/24 229 lb 9.6 oz (104.1 kg)  02/20/24 225 lb (102.1 kg)     Physical Exam Pt is alert and oriented PERRL EOMI HEART IS RRR no murmur or rub LCTA no wheezing or rales MUSCULOSKELETAL reveals some paraspinous muscle tenderness but no overt trigger points.  He walks with the assistance of a cane.  Muscle tone and bulk is at baseline.  Labs  Lab Results  Component Value Date   HGBA1C 6.5 (H) 02/12/2024   HGBA1C 6.1 (A) 02/14/2023   HGBA1C  6.1 02/14/2023   HGBA1C 6.1 02/14/2023   HGBA1C 6.1 02/14/2023   Lab Results  Component Value Date   MICROALBUR 9.9 01/25/2022   LDLCALC 36 09/09/2022   CREATININE 1.13 02/14/2024    -------------------------------------------------------------------------------------------------------------------- Lab Results  Component Value Date   WBC 7.3 02/13/2024   HGB 12.8 (L) 02/13/2024   HCT 40.3 02/13/2024   PLT 164 02/13/2024   GLUCOSE 85 02/14/2024   CHOL 92 09/09/2022   TRIG 150 (H) 09/09/2022   HDL 26 (L) 09/09/2022   LDLDIRECT 31.0 01/25/2022   LDLCALC 36 09/09/2022   ALT 15 02/13/2024   AST 18 02/13/2024   NA 139 02/14/2024   K 4.2 02/14/2024   CL 105 02/14/2024   CREATININE 1.13 02/14/2024   BUN 19 02/14/2024   CO2 27 02/14/2024   TSH 1.22 02/14/2023   PSA 0.90 03/01/2019   INR 1.2 03/28/2022    HGBA1C 6.5 (H) 02/12/2024   MICROALBUR 9.9 01/25/2022    --------------------------------------------------------------------------------------------------------------------- ECHOCARDIOGRAM COMPLETE Result Date: 02/14/2024    ECHOCARDIOGRAM REPORT   Patient Name:   Tony Lawrence Date of Exam: 02/14/2024 Medical Rec #:  [id number]       Height:       70.0 in Accession #:    [id number]      Weight:       230.0 lb Date of Birth:  05-15-1945       BSA:          2.215 m Patient Age:    78 years        BP:           160/57 mmHg Patient Gender: M               HR:           58 bpm. Exam Location:  [facility] Procedure: 2D Echo, Cardiac Doppler and Color Doppler (Both Spectral and Color            Flow Doppler were utilized during procedure). 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 Risk Factors:Diabetes and Hypertension.  Sonographer:     Christopher Furnace Referring Phys:  [id number] PRENTICE BROCKS CORE Diagnosing Phys: Lonni End MD  Sonographer Comments: Technically challenging study due to limited acoustic windows, no parasternal window and no subcostal window. IMPRESSIONS  1. Left ventricular ejection fraction, by estimation, is 55 to 60%. The left ventricle has normal function. Left ventricular endocardial border not optimally defined to evaluate regional wall motion. There is mild left ventricular hypertrophy. Left ventricular diastolic parameters are consistent with Grade I diastolic dysfunction (impaired relaxation).  2. Right ventricular systolic function is normal. The right ventricular size is normal.  3. Right atrial size was mildly dilated.  4. The mitral valve is degenerative. No evidence of mitral valve regurgitation. No evidence of mitral stenosis. Moderate mitral annular calcification.  5. The aortic valve was not well visualized. There is mild calcification of the aortic valve. There is  mild thickening of the aortic valve. Aortic valve regurgitation is trivial.  6. Aortic dilatation noted. There is borderline dilatation of the aortic root. FINDINGS  Left Ventricle: Left ventricular ejection fraction, by estimation, is 55 to 60%. The left ventricle has normal function. Left ventricular endocardial border not optimally defined to  evaluate regional wall motion. The left ventricular internal cavity size was normal in size. There is mild left ventricular hypertrophy. Left ventricular diastolic parameters are consistent with Grade I diastolic dysfunction (impaired relaxation). Right Ventricle: The right ventricular size is normal. No increase in right ventricular wall thickness. Right ventricular systolic function is normal. Left Atrium: Left atrial size was normal in size. Right Atrium: Right atrial size was mildly dilated. Pericardium: The pericardium was not well visualized. Mitral Valve: The mitral valve is degenerative in appearance. There is mild thickening of the mitral valve leaflet(s). There is mild calcification of the mitral valve leaflet(s). Moderate mitral annular calcification. No evidence of mitral valve regurgitation. No evidence of mitral valve stenosis. MV peak gradient, 9.1 mmHg. The mean mitral valve gradient is 4.0 mmHg. Tricuspid Valve: The tricuspid valve is not well visualized. Tricuspid valve regurgitation is trivial. Aortic Valve: The aortic valve was not well visualized. There is mild calcification of the aortic valve. There is mild thickening of the aortic valve. Aortic valve regurgitation is trivial. Aortic valve mean gradient measures 6.0 mmHg. Aortic valve peak gradient measures 10.6 mmHg. Aortic valve area, by VTI measures 2.63 cm. Pulmonic Valve: The pulmonic valve was not well visualized. Aorta: Aortic dilatation noted. There is borderline dilatation of the aortic root. Pulmonary Artery: The pulmonary artery is not well seen. IAS/Shunts: The interatrial septum was not  well visualized.  LEFT VENTRICLE PLAX 2D LVIDd:         4.40 cm   Diastology LVIDs:         2.80 cm   LV e' medial:    6.64 cm/s LV PW:         1.30 cm   LV E/e' medial:  14.9 LV IVS:        1.30 cm   LV e' lateral:   33.50 cm/s LVOT diam:     2.30 cm   LV E/e' lateral: 3.0 LV SV:         96 LV SV Index:   43 LVOT Area:     4.15 cm  RIGHT VENTRICLE RV Basal diam:  4.00 cm RV Mid diam:    3.70 cm RV S prime:     11.00 cm/s TAPSE (M-mode): 2.8 cm LEFT ATRIUM             Index        RIGHT ATRIUM           Index LA diam:        5.10 cm 2.30 cm/m   RA Area:     20.90 cm LA Vol (A2C):   55.6 ml 25.10 ml/m  RA Volume:   60.30 ml  27.22 ml/m LA Vol (A4C):   52.1 ml 23.52 ml/m LA Biplane Vol: 56.0 ml 25.28 ml/m  AORTIC VALVE AV Area (Vmax):    2.30 cm AV Area (Vmean):   2.44 cm AV Area (VTI):     2.63 cm AV Vmax:           163.00 cm/s AV Vmean:          108.000 cm/s AV VTI:            0.363 m AV Peak Grad:      10.6 mmHg AV Mean Grad:      6.0 mmHg LVOT Vmax:         90.20 cm/s LVOT Vmean:        63.400 cm/s LVOT VTI:  0.230 m LVOT/AV VTI ratio: 0.63  AORTA Ao Root diam: 3.80 cm MITRAL VALVE                TRICUSPID VALVE MV Area (PHT): 3.26 cm     TR Peak grad:   32.9 mmHg MV Area VTI:   1.90 cm     TR Vmax:        287.00 cm/s MV Peak grad:  9.1 mmHg MV Mean grad:  4.0 mmHg     SHUNTS MV Vmax:       1.51 m/s     Systemic VTI:  0.23 m MV Vmean:      87.1 cm/s    Systemic Diam: 2.30 cm MV Decel Time: 233 msec MV E velocity: 99.00 cm/s MV A velocity: 140.00 cm/s MV E/A ratio:  0.71 Lonni End MD Electronically signed by Lonni Hanson MD Signature Date/Time: 02/14/2024/7:30:39 PM    Final      Assessment & Plan:   Tony Lawrence was seen today for back pain, hip pain, leg pain and knee pain.  Diagnoses and all orders for this visit:  Spinal stenosis of lumbar region with neurogenic claudication  Weakness of right leg  Degeneration of intervertebral disc of lumbar region with discogenic back  pain  Facet arthritis of lumbar region  Medication management  Chronic, continuous use of opioids -     ToxASSURE Select 13 (MW), Urine  Chronic pain syndrome -     ToxASSURE Select 13 (MW), Urine  Other orders -     traMADol  (ULTRAM ) 50 MG tablet; Take 2 tablets (100 mg total) by mouth 2 (two) times daily.        ----------------------------------------------------------------------------------------------------------------------  Problem List Items Addressed This Visit       Unprioritized   DDD (degenerative disc disease), lumbar   Facet arthritis of lumbar region   Relevant Medications   traMADol  (ULTRAM ) 50 MG tablet   Spinal stenosis of lumbar region - Primary   Weakness of right leg   Other Visit Diagnoses       Medication management         Chronic, continuous use of opioids       Relevant Orders   ToxASSURE Select 13 (MW), Urine (Completed)     Chronic pain syndrome       Relevant Medications   traMADol  (ULTRAM ) 50 MG tablet   Other Relevant Orders   ToxASSURE Select 13 (MW), Urine (Completed)         ----------------------------------------------------------------------------------------------------------------------  1. Spinal stenosis of lumbar region with neurogenic claudication (Primary) Continue core stretching strengthening exercises as tolerated.  He seems to be doing reasonably well with his spinal stenosis and claudication has remained stable.  We did talk about possible neurosurgical evaluation should this worsen, as an option.  2. Weakness of right leg Stable  3. Degeneration of intervertebral disc of lumbar region with discogenic back pain As above  4. Facet arthritis of lumbar region   5. Medication management   6. Chronic, continuous use of opioids I have reviewed the   practitioner database information is appropriate to continue with current medication regimen.  He is doing well with this. - ToxASSURE Select 13  (MW), Urine  7. Chronic pain syndrome  - ToxASSURE Select 13 (MW), Urine    ----------------------------------------------------------------------------------------------------------------------  I am having Tony Lawrence start on traMADol . I am also having him maintain his Multiple Vitamins-Minerals (CENTRUM SILVER 50+MEN PO), glucose blood, co-enzyme Q-10, OneTouch Delica Lancets 33G, clopidogrel ,  losartan , isosorbide  mononitrate, atorvastatin , levothyroxine , liothyronine , pantoprazole , glimepiride , and Rybelsus .   Meds ordered this encounter  Medications   traMADol  (ULTRAM ) 50 MG tablet    Sig: Take 2 tablets (100 mg total) by mouth 2 (two) times daily.    Dispense:  120 tablet    Refill:  1   Patient's Medications  New Prescriptions   TRAMADOL  (ULTRAM ) 50 MG TABLET    Take 2 tablets (100 mg total) by mouth 2 (two) times daily.  Previous Medications   ATORVASTATIN  (LIPITOR) 20 MG TABLET    Take 1 tablet (20 mg total) by mouth daily.   CLOPIDOGREL  (PLAVIX ) 75 MG TABLET    TAKE 1 TABLET BY MOUTH DAILY   CO-ENZYME Q-10 30 MG CAPSULE    Take 30 mg by mouth daily.   GLIMEPIRIDE  (AMARYL ) 2 MG TABLET    Take 2 mg by mouth daily.   GLUCOSE BLOOD TEST STRIP    E 11.9 qd Once Daily one touch   ISOSORBIDE  MONONITRATE (IMDUR ) 30 MG 24 HR TABLET    TAKE 1 TABLET BY MOUTH EVERY MORNING AND TAKE TWO TABLETS BY MOUTH EVERY EVENING   LEVOTHYROXINE  (SYNTHROID ) 200 MCG TABLET    Take 1 tablet (200 mcg total) by mouth daily. In am on empty stomach   LIOTHYRONINE  (CYTOMEL ) 5 MCG TABLET    Take 0.5 tablets (2.5 mcg total) by mouth every other day.   LOSARTAN  (COZAAR ) 50 MG TABLET    Take 0.5 tablets (25 mg total) by mouth in the morning and at bedtime.   MULTIPLE VITAMINS-MINERALS (CENTRUM SILVER 50+MEN PO)    Take 1 tablet by mouth every morning.    ONETOUCH DELICA LANCETS 33G MISC    Check blood sugars twice weekly   PANTOPRAZOLE  (PROTONIX ) 40 MG TABLET    Take 1 tablet (40 mg total) by mouth  daily.   RYBELSUS  7 MG TABS    Take 1 tablet by mouth daily.  Modified Medications   No medications on file  Discontinued Medications   No medications on file   ----------------------------------------------------------------------------------------------------------------------  Follow-up: Return in about 2 months (around 04/29/2024) for evaluation, med refill.    Lynwood KANDICE Clause, MD

## 2024-03-12 ENCOUNTER — Other Ambulatory Visit: Payer: Self-pay | Admitting: Otolaryngology

## 2024-03-12 DIAGNOSIS — C029 Malignant neoplasm of tongue, unspecified: Secondary | ICD-10-CM

## 2024-03-16 ENCOUNTER — Ambulatory Visit
Admission: RE | Admit: 2024-03-16 | Discharge: 2024-03-16 | Disposition: A | Discharge status: Home / Self Care | Source: Ambulatory Visit | Attending: Otolaryngology | Admitting: Otolaryngology

## 2024-03-16 DIAGNOSIS — C029 Malignant neoplasm of tongue, unspecified: Secondary | ICD-10-CM | POA: Diagnosis present

## 2024-03-25 ENCOUNTER — Other Ambulatory Visit: Payer: Self-pay | Admitting: Internal Medicine

## 2024-03-27 NOTE — Telephone Encounter (Signed)
 Please contact pt for future appointment. Pt overdue for 6 month f/u.

## 2024-03-28 ENCOUNTER — Other Ambulatory Visit: Payer: Self-pay | Admitting: Internal Medicine

## 2024-03-29 ENCOUNTER — Ambulatory Visit: Attending: Nurse Practitioner | Admitting: Nurse Practitioner

## 2024-03-29 ENCOUNTER — Encounter: Payer: Self-pay | Admitting: Nurse Practitioner

## 2024-03-29 VITALS — BP 120/58 | HR 97 | Ht 70.0 in | Wt 217.1 lb

## 2024-03-29 DIAGNOSIS — I351 Nonrheumatic aortic (valve) insufficiency: Secondary | ICD-10-CM | POA: Diagnosis present

## 2024-03-29 DIAGNOSIS — E1169 Type 2 diabetes mellitus with other specified complication: Secondary | ICD-10-CM | POA: Insufficient documentation

## 2024-03-29 DIAGNOSIS — I10 Essential (primary) hypertension: Secondary | ICD-10-CM | POA: Insufficient documentation

## 2024-03-29 DIAGNOSIS — Z79899 Other long term (current) drug therapy: Secondary | ICD-10-CM | POA: Insufficient documentation

## 2024-03-29 DIAGNOSIS — I77810 Thoracic aortic ectasia: Secondary | ICD-10-CM | POA: Insufficient documentation

## 2024-03-29 DIAGNOSIS — I2510 Atherosclerotic heart disease of native coronary artery without angina pectoris: Secondary | ICD-10-CM | POA: Diagnosis present

## 2024-03-29 DIAGNOSIS — E785 Hyperlipidemia, unspecified: Secondary | ICD-10-CM | POA: Diagnosis present

## 2024-03-29 DIAGNOSIS — N179 Acute kidney failure, unspecified: Secondary | ICD-10-CM | POA: Diagnosis present

## 2024-03-29 NOTE — Patient Instructions (Signed)
 Medication Instructions:  Your physician recommends that you continue on your current medications as directed. Please refer to the Current Medication list given to you today.   *If you need a refill on your cardiac medications before your next appointment, please call your pharmacy*  Lab Work: Your provider would like for you to have following labs drawn today BMET, Lipid and Direct LDL.    You have labs (blood work) drawn today and your tests are completely normal, you will receive your results only by: MyChart Message (if you have MyChart) OR A paper copy in the mail If you have any lab test that is abnormal or we need to change your treatment, we will call you to review the results.  Testing/Procedures: No test ordered today   Follow-Up: At [facility], you and your health needs are our priority.  As part of our continuing mission to provide you with exceptional heart care, our providers are all part of one team.  This team includes your primary Cardiologist (physician) and Advanced Practice Providers or APPs (Physician Assistants and Nurse Practitioners) who all work together to provide you with the care you need, when you need it.  Your next appointment:   6 month(s)  Provider:   Lonni Hanson, MD or Lonni Meager, NP    We recommend signing up for the patient portal called MyChart.  Sign up information is provided on this After Visit Summary.  MyChart is used to connect with patients for Virtual Visits (Telemedicine).  Patients are able to view lab/test results, encounter notes, upcoming appointments, etc.  Non-urgent messages can be sent to your provider as well.   To learn more about what you can do with MyChart, go to [email or web address].

## 2024-03-29 NOTE — Progress Notes (Signed)
 Office Visit    Patient Name: Tony Lawrence Date of Encounter: 03/29/2024  Primary Care Provider:  Gretel App, NP Primary Cardiologist:  Lonni Hanson, MD  Cardiology APP:  Vivienne Lonni Ingle, NP   Chief Complaint    78 y.o. male w/ a h/o CAD (CTO of LAD on cath 2017), HTN, HL, OSA on CPAP, DMII, mild aortic root dilatation, mitral regurgitation, aortic insufficiency, h/o LLE DVT, TIA (03/2022), tongue cancer, hypothyroidism, DDD, and chronic low back pain, who presents for CAD follow-up.  Past Medical History   Subjective   Past Medical History:  Diagnosis Date   Actinic keratoses    Actinic keratoses    Allergy 1977   Aneurysm of ascending aorta without rupture 03/27/2022   Annual physical exam 02/27/2021   Aortic root dilatation    a. 08/2022 Echo: Ao Root 4.0 cm; b. 01/2024 Echo: Ao Root 3.8 cm.   Aortic valve regurgitation    a. 08/2022 Echo: Mild-mod AI; b. 01/2024 Echo: Triv AI.   Arthritis 1996   low back pain s/p shots and blocks, knee pain   Arthritis of right knee 11/15/2019   BCC (basal cell carcinoma of skin)    forehead and chest Dr. 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 10/2015 MV: large, severe, mid ant, mid antlat, apical ant, apical septal, apical defect/ischemia; b. 10/2015 Cath: LM nl, LAD 100p/m, LCXmin irregs, RCA nl-->Med Rx.   COVID-19    11/17/20   Diabetes mellitus (HCC) 03/12/2013   Diarrhea 03/21/2018   Diastolic dysfunction    a. 01/2024 Echo: EF 55-60%, mild LVH, GrI DD, nl RV fxn, mildly dil RA, triv AI.   Erectile dysfunction due to arterial insufficiency 04/24/2019   Essential hypertension 12/22/2017   Flank pain 11/30/2013   GERD  (gastroesophageal reflux disease)    H/O total knee replacement, 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   History of chicken pox    History of shingles    History of skin cancer 03/21/2018   History of tongue cancer 03/21/2018   HLD (hyperlipidemia) 03/28/2022   Hyperlipidemia    Hypertension    Hypothyroidism    Increased frequency of urination 03/12/2013   Malignant neoplasm of tongue, tip and lateral border (HCC) 11/04/2015   Mitral regurgitation    a. 08/2022 Echo: Mild MR; b. 01/2024 Echo: Degenerative/calcified MV w/o MR.   Mitral valve insufficiency 12/22/2019   Neuritis or radiculitis due to rupture of lumbar intervertebral disc 01/16/2014   Onychomycosis 02/27/2021   OSA on CPAP    Other intervertebral disc displacement, lumbar region 01/16/2014   Parapelvic renal cyst 12/18/2013   Primary osteoarthritis of both knees 05/13/2014   Primary osteoarthritis of left knee 11/22/2018   SCC (squamous cell carcinoma)    invasive left Post. lateral Tongue [facility] Dr. Denys excised 08/25/20 surgical exc unc ?recurrent vs new primary    Sleep apnea 1992   Slowing of urinary stream 03/12/2013   Squamous cell carcinoma in situ    nose 2021    Squamous cell carcinoma in situ    nose 2021    Stroke (HCC)    Thrombocytopenia 01/27/2022   Tongue cancer (HCC)  Squamous cell CA of tongue 11/11/15 no chemo or radiation ENT Dr. Herminio, [facility] H/o    Tongue cancer [facility])    Squamous cell CA of tongue 11/11/15 no chemo or radiation ENT Dr. Herminio, [facility] H/o    Urinary hesitancy 03/12/2013   Valvular heart disease 03/23/2021   Past Surgical History:  Procedure Laterality Date   BIOPSY TONGUE     11/11/15 SCC tongue    cancer of tongue  08/2020   removal of cancer   CARDIAC CATHETERIZATION N/A 11/10/2015   Procedure: Left Heart Cath and Coronary Angiography;  Surgeon: Deatrice DELENA Cage, MD;  Location: [facility] INVASIVE CV LAB;  Service: Cardiovascular;  Laterality: N/A;   COLONOSCOPY  05/2006   COLONOSCOPY WITH PROPOFOL   N/A 05/24/2017   Procedure: COLONOSCOPY WITH PROPOFOL ;  Surgeon: Gaylyn Gladis PENNER, MD;  Location[facility] ENDOSCOPY;  Service: Endoscopy;  Laterality: N/A;   EXCISION OF TONGUE LESION N/A 11/11/2015   Procedure: EXCISION OF TONGUE LESION/ WITH FROZEN SECTION;  Surgeon: Chinita Herminio, MD;  Location: [facility] ORS;  Service: ENT;  Laterality: N/A;   JOINT REPLACEMENT  2022   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 [facility]     VASECTOMY  1977    Allergies  Allergies  Allergen Reactions   Penicillins Hives, Rash, Other (See Comments) and Swelling    Has patient had a PCN reaction causing immediate rash, facial/tongue/throat swelling, SOB or lightheadedness with hypotension: Yes Has patient had a PCN reaction causing severe rash involving mucus membranes or skin necrosis: No Has patient had a PCN reaction that required hospitalization No Has patient had a PCN reaction occurring within the last 10 years: No If all of the above answers are NO, then may proceed with Cephalosporin use.        History of Present Illness      78 y.o. y/o male w/ a h/o CAD (CTO of LAD on cath 2017), HTN, HL, OSA on CPAP, DMII, mild aortic root dilatation, mitral regurgitation, aortic insufficiency, h/o LLE DVT, TIA (03/2022), tongue cancer, hypothyroidism, DDD, and chronic low back pain.  Patient previously underwent cardiovascular preoperative evaluation in 2017 with normal echocardiogram and stress testing which showed a large defect in the mid anterior, mid anteroseptal, apical anterior, apical septal, and apical locations.  He subsequently underwent diagnostic catheterization July 2017 showing a chronic total occlusion of the LAD after the origin of a large diagonal branch with left to left and faint right to left collaterals.  EF was 50-55% with mild anterior wall  hypokinesis.  Given chronic nature with occlusion, decision was made to pursue medical therapy.  Follow-up echocardiogram in May 2024 showed EF of 60 to 65% with without regional wall motion abnormalities, moderate LVH, grade 1 diastolic dysfunction, mild MR and mild to moderate AI.  The aortic root was also mildly dilated at 40 mm.    Tony Lawrence was last seen in cardiology clinic in September 2024.  Since then, he has continued to do reasonably well from a cardiac standpoint.  He continues to experience pain in his left leg related to sciatica for which he continues to participate in physical therapy.  He is able to tolerate exercises without experiencing chest pain or dyspnea.  In addition to PT, he does exercise on other days.  He was admitted to [facility]  regional in October 2025 in the setting of astrovirus and gastroenteritis w/ severe dehydration and AKI.  Echo was performed during that admission and showed EF of 55 to 60% with mild LVH, grade 1 diastolic dysfunction, normal RV function, trivial AI, and no evidence of MR.  Aortic root was measured at 38 mm.  Since hospital discharge, his return to normal activities without symptoms or limitations.  He denies palpitations, PND, orthopnea, dizziness, syncope, edema, or early satiety.  Of note, he was advised to discontinue HCTZ at hospital discharge though he has been taking regularly with good tolerance and blood pressure control. Objective   Home Medications    Current Outpatient Medications  Medication Sig Dispense Refill   atorvastatin  (LIPITOR) 20 MG tablet Take 1 tablet (20 mg total) by mouth daily. 90 tablet 3   clopidogrel  (PLAVIX ) 75 MG tablet TAKE 1 TABLET BY MOUTH DAILY 90 tablet 3   co-enzyme Q-10 30 MG capsule Take 30 mg by mouth daily.     glimepiride  (AMARYL ) 2 MG tablet Take 2 mg by mouth daily.     glucose blood test strip E 11.9 qd Once Daily one touch 100 each 12   hydrochlorothiazide  (HYDRODIURIL ) 12.5 MG tablet Take 12.5 mg  by mouth daily.     isosorbide  mononitrate (IMDUR ) 30 MG 24 hr tablet TAKE 1 TABLET BY MOUTH EVERY MORNING AND TAKE TWO TABLETS BY MOUTH EVERY EVENING 270 tablet 3   levothyroxine  (SYNTHROID ) 200 MCG tablet Take 1 tablet (200 mcg total) by mouth daily. In am on empty stomach 90 tablet 3   liothyronine  (CYTOMEL ) 5 MCG tablet Take 0.5 tablets (2.5 mcg total) by mouth every other day. 45 tablet 3   losartan  (COZAAR ) 50 MG tablet Take 0.5 tablets (25 mg total) by mouth in the morning and at bedtime. 90 tablet 3   Multiple Vitamins-Minerals (CENTRUM SILVER 50+MEN PO) Take 1 tablet by mouth every morning.      OneTouch Delica Lancets 33G MISC Check blood sugars twice weekly 100 each 1   pantoprazole  (PROTONIX ) 40 MG tablet Take 1 tablet (40 mg total) by mouth daily. 90 tablet 3   RYBELSUS  7 MG TABS Take 1 tablet by mouth daily.     traMADol  (ULTRAM ) 50 MG tablet Take 2 tablets (100 mg total) by mouth 2 (two) times daily. 120 tablet 1   No current facility-administered medications for this visit.     Physical Exam    VS:  BP (!) 120/58 (BP Location: Left Arm, Patient Position: Sitting, Cuff Size: Normal)   Pulse 97   Ht 5' 10 (1.778 m)   Wt 217 lb 2 oz (98.5 kg)   SpO2 98%   BMI 31.15 kg/m  , BMI Body mass index is 31.15 kg/m.          GEN: Well nourished, well developed, in no acute distress. HEENT: normal. Neck: Supple, no JVD, carotid bruits, or masses. Cardiac: RRR, no murmurs, rubs, or gallops. No clubbing, cyanosis, edema.  Radials 2+/PT 2+ and equal bilaterally.  Respiratory:  Respirations regular and unlabored, clear to auscultation bilaterally. GI: Soft, nontender, nondistended, BS + x 4. MS: no deformity or atrophy. Skin: warm and dry, no rash. Neuro:  Strength and sensation are intact. Psych: Normal affect.  Accessory Clinical Findings    Lab Results  Component Value Date   WBC 7.3 02/13/2024   HGB 12.8 (L) 02/13/2024   HCT 40.3 02/13/2024   MCV 87.4 02/13/2024    PLT 164 02/13/2024  Lab Results  Component Value Date   CREATININE 1.13 02/14/2024   BUN 19 02/14/2024   NA 139 02/14/2024   K 4.2 02/14/2024   CL 105 02/14/2024   CO2 27 02/14/2024   Lab Results  Component Value Date   ALT 15 02/13/2024   AST 18 02/13/2024   ALKPHOS 75 02/13/2024   BILITOT 1.0 02/13/2024   Lab Results  Component Value Date   CHOL 92 09/09/2022   HDL 26 (L) 09/09/2022   LDLCALC 36 09/09/2022   LDLDIRECT 31.0 01/25/2022   TRIG 150 (H) 09/09/2022   CHOLHDL 3.5 09/09/2022    Lab Results  Component Value Date   HGBA1C 6.5 (H) 02/12/2024   Lab Results  Component Value Date   TSH 1.22 02/14/2023       Assessment & Plan    1.  Coronary artery disease: Abnormal stress test in 2017 with diagnostic catheterization revealing a chronic total occlusion of the LAD, which has been medically managed.  He is active without chest pain or dyspnea.  Echo in October with normal LV function.  He remains on statin, clopidogrel , long-acting nitrate, and ARB therapy.  2.  Primary hypertension: Blood pressure stable on ARB therapy.  3.  Hyperlipidemia: Overdue for lipids.  Checking fasting lipids and direct LDL today.  LFTs were normal in October.  He remains on atorvastatin  therapy.  4.  Type 2 diabetes mellitus: A1c is 6.5 in October.  He is on Amaryl  and managed by primary care.  5.  Aortic root dilatation: Aortic root of 40 mm on echo in 2024 with more recent echo in October 2025 showing stable aortic root size at 38 mm.  Blood pressure well-controlled.  6.  Valvular heart disease history of AI and MR with most recent echo in October showing trace AI and no significant MR.  7.  Acute kidney injury: Recent admission for astrovirus and gastroenteritis complicated by acute kidney injury.  He has since resumed ARB and diuretic therapy and is feeling well with stable blood pressure today.  I will follow-up a basic metabolic panel.  8.  Disposition: Follow-up basic  metabolic panel and fasting lipids today.  Follow-up in clinic in 6 months or sooner if necessary.  Lonni Meager, NP 03/29/2024, 12:28 PM

## 2024-03-30 ENCOUNTER — Ambulatory Visit: Payer: Self-pay | Admitting: Nurse Practitioner

## 2024-03-30 LAB — BASIC METABOLIC PANEL WITH GFR
BUN/Creatinine Ratio: 15 (ref 10–24)
BUN: 18 mg/dL (ref 8–27)
CO2: 24 mmol/L (ref 20–29)
Calcium: 9.2 mg/dL (ref 8.6–10.2)
Chloride: 98 mmol/L (ref 96–106)
Creatinine, Ser: 1.21 mg/dL (ref 0.76–1.27)
Glucose: 91 mg/dL (ref 70–99)
Potassium: 4.6 mmol/L (ref 3.5–5.2)
Sodium: 137 mmol/L (ref 134–144)
eGFR: 61 mL/min/1.73 (ref >59)

## 2024-03-30 LAB — LDL CHOLESTEROL, DIRECT: LDL Direct: 35 mg/dL (ref 0–99)

## 2024-04-08 ENCOUNTER — Telehealth: Admitting: Family

## 2024-04-08 DIAGNOSIS — J101 Influenza due to other identified influenza virus with other respiratory manifestations: Secondary | ICD-10-CM

## 2024-04-08 DIAGNOSIS — R6889 Other general symptoms and signs: Secondary | ICD-10-CM | POA: Diagnosis not present

## 2024-04-08 MED ORDER — BENZONATATE 100 MG PO CAPS: 100.0000 mg | ORAL_CAPSULE | Freq: Two times a day (BID) | ORAL | 0 refills | Status: DC | PRN

## 2024-04-08 MED ORDER — OSELTAMIVIR PHOSPHATE 75 MG PO CAPS: 75.0000 mg | ORAL_CAPSULE | Freq: Two times a day (BID) | ORAL | 0 refills | Status: DC

## 2024-04-08 NOTE — Patient Instructions (Signed)

## 2024-04-08 NOTE — Progress Notes (Signed)
 Virtual Visit Consent   Tony Lawrence, you are scheduled for a virtual visit with a [facility] provider today. Just as with appointments in the office, your consent must be obtained to participate. Your consent will be active for this visit and any virtual visit you may have with one of our providers in the next 365 days. If you have a MyChart account, a copy of this consent can be sent to you electronically.  As this is a virtual visit, video technology does not allow for your provider to perform a traditional examination. This may limit your provider's ability to fully assess your condition. If your provider identifies any concerns that need to be evaluated in person or the need to arrange testing (such as labs, EKG, etc.), we will make arrangements to do so. Although advances in technology are sophisticated, we cannot ensure that it will always work on either your end or our end. If the connection with a video visit is poor, the visit may have to be switched to a telephone visit. With either a video or telephone visit, we are not always able to ensure that we have a secure connection.  By engaging in this virtual visit, you consent to the provision of healthcare and authorize for your insurance to be billed (if applicable) for the services provided during this visit. Depending on your insurance coverage, you may receive a charge related to this service.  I need to obtain your verbal consent now. Are you willing to proceed with your visit today? [name] has provided verbal consent on 04/08/2024 for a virtual visit (video or telephone). Bari Learn, FNP  Date: 04/08/2024 7:11 PM   Virtual Visit via Video Note   I, Bari Learn, connected with  Tony Lawrence  ([id number], 1945/07/09) on 04/08/2024 at  7:15 PM EST by a video-enabled telemedicine application and verified that I am speaking with the correct person using two identifiers.  Location: Patient: Virtual Visit Location  Patient: Home Provider: Virtual Visit Location Provider: Home Office   I discussed the limitations of evaluation and management by telemedicine and the availability of in person appointments. The patient expressed understanding and agreed to proceed.    History of Present Illness: Tony Lawrence is a 78 y.o. who identifies as a male who was assigned male at birth, and is being seen today for  diarrhea, cough, and congestion that started yesterday. Reports he took a Flu test and reports it was positive for flu A.    HPI: URI  This is a new problem. The current episode started in the past 7 days. The problem has been unchanged. The maximum temperature recorded prior to his arrival was 100.4 - 100.9 F. Associated symptoms include congestion, coughing and joint pain. Pertinent negatives include no ear pain, headaches, rhinorrhea, sinus pain or sore throat. He has tried nothing for the symptoms. The treatment provided mild relief.    Problems:  Patient Active Problem List   Diagnosis Date Noted   Hospital discharge follow-up 02/22/2024   Astrovirus gastroenteritis 02/13/2024   AKI (acute kidney injury) 02/12/2024   Nausea & vomiting 02/12/2024   Hypovolemic shock (HCC) 02/12/2024   Spinal stenosis of lumbar region 12/28/2022   Weakness of right leg 12/28/2022   DDD (degenerative disc disease), lumbar 12/28/2022   Facet arthritis of lumbar region 12/28/2022   Back pain 12/10/2022   Dilated aortic root 09/30/2022   Carcinoma in situ of oral cavity 06/23/2022   TIA (transient ischemic  attack) 03/28/2022   DM (diabetes mellitus) (HCC) 03/28/2022   Chronic diastolic CHF (congestive heart failure) (HCC) 03/28/2022   HLD (hyperlipidemia) 03/28/2022   CAD (coronary artery disease) 03/28/2022   Nonrheumatic aortic valve insufficiency 03/27/2022   Gastroenteritis 12/29/2021   Centrilobular emphysema (HCC) 02/25/2021   Nodule of right lung 02/25/2021   HTN (hypertension) 11/18/2020   SCC  (squamous cell carcinoma)    Tongue cancer (HCC) 08/01/2020   Obesity (BMI 30-39.9) 11/15/2019   Benign prostatic hyperplasia with nocturia 11/15/2019   DVT (deep venous thrombosis) (HCC) 07/05/2019   S/P total knee replacement using cement, left 06/13/2019   Gastroesophageal reflux disease without esophagitis 03/21/2018   OSA (obstructive sleep apnea) 03/21/2018   Hypothyroidism 03/21/2018   Chronic low back pain 03/14/2018   Coronary artery disease of native artery of native heart with stable angina pectoris 12/22/2017   Essential hypertension 12/22/2017   Hyperlipidemia associated with type 2 diabetes mellitus (HCC) 12/22/2017   Arthritis, degenerative 03/12/2013    Allergies: Allergies[1] Medications: Current Medications[2]  Observations/Objective: Patient is well-developed, well-nourished in no acute distress.  Resting comfortably  at home.  Head is normocephalic, atraumatic.  No labored breathing.  Speech is clear and coherent with logical content.  Patient is alert and oriented at baseline.  Dry coarse  nonproductive cough  Assessment and Plan: 1. Influenza A (Primary) - oseltamivir  (TAMIFLU ) 75 MG capsule; Take 1 capsule (75 mg total) by mouth 2 (two) times daily.  Dispense: 10 capsule; Refill: 0 - benzonatate  (TESSALON ) 100 MG capsule; Take 1-2 capsules (100-200 mg total) by mouth 2 (two) times daily as needed for cough.  Dispense: 20 capsule; Refill: 0  2. Flu-like symptoms  - Take meds as prescribed - Use a cool mist humidifier  -Use saline nose sprays frequently -Force fluids -For any cough or congestion  Use plain Mucinex- regular strength or max strength is fine -For fever or aces or pains- take tylenol  or ibuprofen. -Throat lozenges if help -Follow up if symptoms worsen or do not improve   Follow Up Instructions: I discussed the assessment and treatment plan with the patient. The patient was provided an opportunity to ask questions and all were answered.  The patient agreed with the plan and demonstrated an understanding of the instructions.  A copy of instructions were sent to the patient via MyChart unless otherwise noted below.     The patient was advised to call back or seek an in-person evaluation if the symptoms worsen or if the condition fails to improve as anticipated.    Bari Learn, FNP    [1]  Allergies Allergen Reactions   Penicillins Hives, Rash, Other (See Comments) and Swelling    Has patient had a PCN reaction causing immediate rash, facial/tongue/throat swelling, SOB or lightheadedness with hypotension: Yes Has patient had a PCN reaction causing severe rash involving mucus membranes or skin necrosis: No Has patient had a PCN reaction that required hospitalization No Has patient had a PCN reaction occurring within the last 10 years: No If all of the above answers are NO, then may proceed with Cephalosporin use.   [2]  Current Outpatient Medications:    benzonatate  (TESSALON ) 100 MG capsule, Take 1-2 capsules (100-200 mg total) by mouth 2 (two) times daily as needed for cough., Disp: 20 capsule, Rfl: 0   oseltamivir  (TAMIFLU ) 75 MG capsule, Take 1 capsule (75 mg total) by mouth 2 (two) times daily., Disp: 10 capsule, Rfl: 0   atorvastatin  (LIPITOR) 20 MG tablet, Take  1 tablet (20 mg total) by mouth daily., Disp: 90 tablet, Rfl: 3   clopidogrel  (PLAVIX ) 75 MG tablet, Take 1 tablet (75 mg total) by mouth daily., Disp: 90 tablet, Rfl: 3   co-enzyme Q-10 30 MG capsule, Take 30 mg by mouth daily., Disp: , Rfl:    glimepiride  (AMARYL ) 2 MG tablet, Take 2 mg by mouth daily., Disp: , Rfl:    glucose blood test strip, E 11.9 qd Once Daily one touch, Disp: 100 each, Rfl: 12   hydrochlorothiazide  (HYDRODIURIL ) 12.5 MG tablet, Take 12.5 mg by mouth daily., Disp: , Rfl:    isosorbide  mononitrate (IMDUR ) 30 MG 24 hr tablet, TAKE 1 TABLET BY MOUTH EVERY MORNING AND TAKE TWO TABLETS BY MOUTH EVERY EVENING, Disp: 270 tablet, Rfl: 3    levothyroxine  (SYNTHROID ) 200 MCG tablet, Take 1 tablet (200 mcg total) by mouth daily. In am on empty stomach, Disp: 90 tablet, Rfl: 3   liothyronine  (CYTOMEL ) 5 MCG tablet, Take 0.5 tablets (2.5 mcg total) by mouth every other day., Disp: 45 tablet, Rfl: 3   losartan  (COZAAR ) 50 MG tablet, Take 0.5 tablets (25 mg total) by mouth in the morning and at bedtime., Disp: 90 tablet, Rfl: 3   Multiple Vitamins-Minerals (CENTRUM SILVER 50+MEN PO), Take 1 tablet by mouth every morning. , Disp: , Rfl:    OneTouch Delica Lancets 33G MISC, Check blood sugars twice weekly, Disp: 100 each, Rfl: 1   pantoprazole  (PROTONIX ) 40 MG tablet, Take 1 tablet (40 mg total) by mouth daily., Disp: 90 tablet, Rfl: 3   RYBELSUS  7 MG TABS, Take 1 tablet by mouth daily., Disp: , Rfl:

## 2024-04-15 ENCOUNTER — Other Ambulatory Visit: Payer: Self-pay | Admitting: Cardiovascular Disease

## 2024-04-29 ENCOUNTER — Other Ambulatory Visit: Payer: Self-pay | Admitting: Internal Medicine

## 2024-04-29 DIAGNOSIS — E1159 Type 2 diabetes mellitus with other circulatory complications: Secondary | ICD-10-CM

## 2024-05-09 ENCOUNTER — Ambulatory Visit: Admitting: Nurse Practitioner

## 2024-05-09 ENCOUNTER — Encounter: Payer: Self-pay | Admitting: Nurse Practitioner

## 2024-05-09 VITALS — BP 120/60 | HR 75 | Temp 98.1°F | Ht 70.0 in | Wt 211.0 lb

## 2024-05-09 DIAGNOSIS — E1129 Type 2 diabetes mellitus with other diabetic kidney complication: Secondary | ICD-10-CM

## 2024-05-09 DIAGNOSIS — R809 Proteinuria, unspecified: Secondary | ICD-10-CM | POA: Diagnosis not present

## 2024-05-09 DIAGNOSIS — E1169 Type 2 diabetes mellitus with other specified complication: Secondary | ICD-10-CM | POA: Diagnosis not present

## 2024-05-09 DIAGNOSIS — E785 Hyperlipidemia, unspecified: Secondary | ICD-10-CM | POA: Diagnosis not present

## 2024-05-09 DIAGNOSIS — I10 Essential (primary) hypertension: Secondary | ICD-10-CM | POA: Diagnosis not present

## 2024-05-09 DIAGNOSIS — C029 Malignant neoplasm of tongue, unspecified: Secondary | ICD-10-CM | POA: Diagnosis not present

## 2024-05-09 DIAGNOSIS — E039 Hypothyroidism, unspecified: Secondary | ICD-10-CM

## 2024-05-09 DIAGNOSIS — Z7984 Long term (current) use of oral hypoglycemic drugs: Secondary | ICD-10-CM

## 2024-05-09 DIAGNOSIS — M5432 Sciatica, left side: Secondary | ICD-10-CM

## 2024-05-09 NOTE — Progress Notes (Signed)
 " Leron Glance, NP-C Phone: [phone]  Tony Lawrence is a 79 y.o. male who presents today for follow up.   Discussed the use of AI scribe software for clinical note transcription with the patient, who gave verbal consent to proceed.  History of Present Illness   Tony Lawrence is a 79 year old male with diabetes who presents with sciatic nerve pain.  He has been experiencing left-sided sciatic nerve pain for the past three and a half to four months. He has a history of bilateral sciatic pain but is currently affected on the left side. He has been undergoing physical therapy for twelve weeks, which has provided some relief, and continues to take tramadol  at night for pain management. The pain is constant but sometimes eases, and he finds comfort in his lounge chair. Due to the pain, he has not been as active, having stopped his routine of thirty minutes on a stationary bike three times a week.  He has experienced significant weight loss, having lost about ninety pounds over the last six to seven years, partly due to surgeries related to tongue cancer, which affected his taste buds. He attributes recent weight loss to a change in medication to Rybelsus  about six months ago, which has decreased his appetite. His weight has decreased from 225-235 pounds to 211 pounds. He is concerned about losing muscle mass, particularly in his legs, due to decreased physical activity.  He has a history of tongue cancer, having undergone five oral surgeries and two related surgeries. He continues to see an endocrinologist and monitors his blood sugar, which ranges from 95 to 105 fasting. His A1c was 6.5 in October, and he takes glimepiride , adjusting the dose based on his fasting blood sugar levels. He also takes levothyroxine , Cytomel , losartan , hydrochlorothiazide , Imdur , and Lipitor. He denies heart palpitations, temperature changes, chest pain, shortness of breath, dizziness, or swelling.  He mentions a past  incident where a shot for sciatic pain on the right side hit the nerve root, causing significant pain for four months, which makes him hesitant to pursue similar treatments. He continues to perform physical therapy exercises and uses ice and heat for relief.      Tobacco Use History[1]  Medications Ordered Prior to Encounter[2]   ROS see history of present illness  Objective  Physical Exam Vitals:   05/09/24 1505  BP: 120/60  Pulse: 75  Temp: 98.1 F (36.7 C)  SpO2: 99%    BP Readings from Last 3 Encounters:  05/09/24 120/60  03/29/24 (!) 120/58  03/05/24 (!) 148/72   Wt Readings from Last 3 Encounters:  05/09/24 211 lb (95.7 kg)  03/29/24 217 lb 2 oz (98.5 kg)  03/05/24 226 lb (102.5 kg)    Physical Exam Constitutional:      General: He is not in acute distress.    Appearance: Normal appearance.  HENT:     Head: Normocephalic.  Cardiovascular:     Rate and Rhythm: Normal rate and regular rhythm.     Heart sounds: Normal heart sounds.  Pulmonary:     Effort: Pulmonary effort is normal.     Breath sounds: Normal breath sounds.  Skin:    General: Skin is warm and dry.  Neurological:     General: No focal deficit present.     Mental Status: He is alert.  Psychiatric:        Mood and Affect: Mood normal.        Behavior: Behavior normal.  Assessment/Plan: Please see individual problem list.  Sciatica of left side Assessment & Plan: He experiences chronic left-sided sciatica with constant pain and has had a previous adverse reaction to steroid injections. Physical therapy provides some relief, and tramadol  is used for pain management. Continue physical therapy and tramadol  at night. Consider oral prednisone for inflammation. Encourage the use of ice and heat therapy and resumption of stationary biking as tolerated. Follow up with Pain Management as scheduled.    Type 2 diabetes mellitus with diabetic microalbuminuria, without long-term current use of  insulin  [facility]) Assessment & Plan: His type 2 diabetes is managed with Rybelsus  and glimepiride , with fasting blood sugars between 95-105 mg/dL and an J8r of 3.4% in October. There have been no recent hypoglycemic episodes. Continue Rybelsus  and glimepiride  as prescribed, monitor blood sugar levels regularly, and encourage healthy diet. Follow up with Endocrinology as scheduled.    Tongue cancer [facility]) Assessment & Plan: He has a history of tongue cancer with multiple oral surgeries and weight loss partially due to loss of taste buds post-surgery. Continue monitoring weight and nutritional status.  No further radiation is needed as per the ENT specialist. Follow up as scheduled.    Hyperlipidemia associated with type 2 diabetes mellitus (HCC) Assessment & Plan: Managed with Lipitor. Continue Lipitor.   Hypothyroidism, unspecified type Assessment & Plan: He is managed with levothyroxine  and Cytomel , with no symptoms of heart palpitations or temperature changes. Continue levothyroxine  and Cytomel .   Hypertension, unspecified type Assessment & Plan: His blood pressure is well-controlled at 120/60 mmHg with losartan  and hydrochlorothiazide . Continue losartan  and hydrochlorothiazide .      Return in about 6 months (around 11/06/2024) for Follow up.   Leron Glance, NP-C [facility] [place] [facility]     [1]  Social History Tobacco Use  Smoking Status Never  Smokeless Tobacco Never  [2]  Current Outpatient Medications on File Prior to Visit  Medication Sig Dispense Refill   atorvastatin  (LIPITOR) 20 MG tablet Take 1 tablet (20 mg total) by mouth daily. 90 tablet 3   clopidogrel  (PLAVIX ) 75 MG tablet Take 1 tablet (75 mg total) by mouth daily. 90 tablet 3   co-enzyme Q-10 30 MG capsule Take 30 mg by mouth daily.     glimepiride  (AMARYL ) 2 MG tablet Take 2 mg by mouth daily.     glucose blood test strip E 11.9 qd Once Daily one touch 100 each 12   hydrochlorothiazide   (HYDRODIURIL ) 12.5 MG tablet Take 1 tablet (12.5 mg total) by mouth daily. 90 tablet 3   isosorbide  mononitrate (IMDUR ) 30 MG 24 hr tablet TAKE 1 TABLET BY MOUTH EVERY MORNING AND TAKE TWO TABLETS BY MOUTH EVERY EVENING 270 tablet 3   levothyroxine  (SYNTHROID ) 200 MCG tablet Take 1 tablet (200 mcg total) by mouth daily. In am on empty stomach 90 tablet 3   liothyronine  (CYTOMEL ) 5 MCG tablet Take 0.5 tablets (2.5 mcg total) by mouth every other day. 45 tablet 3   losartan  (COZAAR ) 50 MG tablet TAKE 1 TABLET BY MOUTH DAILY IN THE MORNING AND TAKE 1 TABLET BY MOUTH DAILY AT BEDTIME 180 tablet 3   Multiple Vitamins-Minerals (CENTRUM SILVER 50+MEN PO) Take 1 tablet by mouth every morning.      OneTouch Delica Lancets 33G MISC Check blood sugars twice weekly 100 each 1   pantoprazole  (PROTONIX ) 40 MG tablet Take 1 tablet (40 mg total) by mouth daily. 90 tablet 3   RYBELSUS  7 MG TABS Take 1 tablet  by mouth daily.     No current facility-administered medications on file prior to visit.   "

## 2024-05-22 ENCOUNTER — Ambulatory Visit: Admitting: Anesthesiology

## 2024-05-22 ENCOUNTER — Encounter: Payer: Self-pay | Admitting: Anesthesiology

## 2024-05-22 ENCOUNTER — Ambulatory Visit: Payer: Medicare Other

## 2024-05-22 DIAGNOSIS — F1190 Opioid use, unspecified, uncomplicated: Secondary | ICD-10-CM | POA: Diagnosis not present

## 2024-05-22 DIAGNOSIS — R29898 Other symptoms and signs involving the musculoskeletal system: Secondary | ICD-10-CM | POA: Diagnosis not present

## 2024-05-22 DIAGNOSIS — M48062 Spinal stenosis, lumbar region with neurogenic claudication: Secondary | ICD-10-CM | POA: Diagnosis not present

## 2024-05-22 DIAGNOSIS — G894 Chronic pain syndrome: Secondary | ICD-10-CM

## 2024-05-22 DIAGNOSIS — M47816 Spondylosis without myelopathy or radiculopathy, lumbar region: Secondary | ICD-10-CM

## 2024-05-22 DIAGNOSIS — Z79899 Other long term (current) drug therapy: Secondary | ICD-10-CM

## 2024-05-22 MED ORDER — TRAMADOL HCL 50 MG PO TABS: 50.0000 mg | ORAL_TABLET | Freq: Three times a day (TID) | ORAL | 1 refills | Status: AC

## 2024-05-22 NOTE — Progress Notes (Signed)
 Virtual Visit via Telephone Note  I connected with Tony Lawrence on 05/22/24 at  1:00 PM EST by telephone and verified that I am speaking with the correct person using two identifiers.  Location: Patient: Home Provider: Pain control center   I discussed the limitations, risks, security and privacy concerns of performing an evaluation and management service by telephone and the availability of in person appointments. I also discussed with the patient that there may be a patient responsible charge related to this service. The patient expressed understanding and agreed to proceed.   History of Present Illness: I spoke with Tony Lawrence via telephone as we could not link for the video portion conference but he reports that his back pain has responded to physical therapy and a combination of low-dose tramadol .  He is taking about 1 tramadol  twice a day occasionally 3 times a day and he has been able to wean this back slightly.  The quality characteristic and distribution of his low back pain and lower leg pain is stable without change.  He is trying to his exercises on a daily basis.  He is planning on a trip to [place]  here in 2 weeks and is concerned about breakthrough pain at that point.  He has no side effects with the Ultram  other than some mild constipation generally resolved with over-the-counter medication therapy.  Review of systems: General: No fevers or chills Pulmonary: No shortness of breath or dyspnea Cardiac: No angina or palpitations or lightheadedness GI: No abdominal pain or constipation Psych: No depression    Observations/Objective: Current Medications[1]  Past Medical History:  Diagnosis Date   Actinic keratoses    Actinic keratoses    Allergy 1977   Aneurysm of ascending aorta without rupture 03/27/2022   Annual physical exam 02/27/2021   Aortic root dilatation    a. 08/2022 Echo: Ao Root 4.0 cm; b. 01/2024 Echo: Ao Root 3.8 cm.   Aortic valve regurgitation     a. 08/2022 Echo: Mild-mod AI; b. 01/2024 Echo: Triv AI.   Arthritis 1996   low back pain s/p shots and blocks, knee pain   Arthritis of right knee 11/15/2019   BCC (basal cell carcinoma of skin)    forehead and chest Dr. 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 10/2015 MV: large, severe, mid ant, mid antlat, apical ant, apical septal, apical defect/ischemia; b. 10/2015 Cath: LM nl, LAD 100p/m, LCXmin irregs, RCA nl-->Med Rx.   COVID-19    11/17/20   Diabetes mellitus (HCC) 03/12/2013   Diarrhea 03/21/2018   Diastolic dysfunction    a. 01/2024 Echo: EF 55-60%, mild LVH, GrI DD, nl RV fxn, mildly dil RA, triv AI.   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 08/2022 Echo: Mild MR; b. 01/2024 Echo: Degenerative/calcified MV w/o MR.   Mitral valve insufficiency 12/22/2019   Neuritis or radiculitis due to rupture of lumbar intervertebral disc 01/16/2014   Onychomycosis 02/27/2021   OSA on CPAP    Other intervertebral disc displacement, lumbar region 01/16/2014   Parapelvic renal cyst 12/18/2013   Primary osteoarthritis of both knees 05/13/2014   Primary osteoarthritis of left knee 11/22/2018   SCC  (squamous cell carcinoma)    invasive left Post. lateral Tongue [facility] Dr. Denys excised 08/25/20 surgical exc [facility] ?recurrent vs new primary    Sleep apnea 1992   Slowing of urinary stream 03/12/2013   Squamous cell carcinoma in situ    nose 2021    Squamous cell carcinoma in situ    nose 2021    Stroke (HCC)    Thrombocytopenia 01/27/2022   Tongue cancer (HCC)    Squamous cell CA of tongue 11/11/15 no chemo or radiation ENT Dr. Herminio, [facility] H/o    Tongue cancer [facility])    Squamous cell CA of tongue 11/11/15 no chemo or radiation ENT Dr. Herminio, [facility] H/o    Urinary hesitancy 03/12/2013   Valvular heart disease 03/23/2021    Assessment and Plan:  1. Spinal stenosis of lumbar region with neurogenic claudication   2. Weakness of right leg   3. Facet arthritis of lumbar region   4. Medication management   5. Chronic, continuous use of opioids   6. Chronic pain syndrome    Based on our conversation after review of the [employer]  practitioner database information it is appropriate to continue him on tramadol  with a refill prescription sent in today for 3 times daily dosing.  This can be as needed as discussed with him.  I am encouraged that he is making progress with physical therapy and his pain is under better control with this regimen.  We have talked about things that can help improve the constipation as well as taking a few days with no tramadol  and being more reliant on Tylenol  or anti-inflammatory medications during that period.  Continue with physical therapy with return to clinic scheduled in 2 months.  Continue follow-up with his primary care physicians for baseline medical care. Follow Up Instructions:    I discussed the assessment and treatment plan with the patient. The patient was provided an opportunity to ask questions and all were answered. The patient agreed with the plan and demonstrated an understanding of the instructions.   The patient was advised to call back or seek  an in-person evaluation if the symptoms worsen or if the condition fails to improve as anticipated.  I provided 30 minutes of non-face-to-face time during this encounter.   Lynwood KANDICE Clause, MD     [1]  Current Outpatient Medications:    traMADol  (ULTRAM ) 50 MG tablet, Take 1 tablet (50 mg total) by mouth 3 (three) times daily., Disp: 90 tablet, Rfl: 1   atorvastatin  (LIPITOR) 20 MG tablet, Take 1 tablet (20 mg total) by mouth daily., Disp: 90 tablet, Rfl: 3   clopidogrel  (PLAVIX ) 75 MG tablet, Take 1 tablet (75 mg total) by mouth daily., Disp: 90 tablet, Rfl: 3   co-enzyme Q-10 30 MG capsule, Take 30 mg by mouth daily., Disp: , Rfl:    glimepiride  (AMARYL ) 2 MG tablet, Take 2 mg by mouth daily., Disp: , Rfl:    glucose  blood test strip, E 11.9 qd Once Daily one touch, Disp: 100 each, Rfl: 12   hydrochlorothiazide  (HYDRODIURIL ) 12.5 MG tablet, Take 1 tablet (12.5 mg total) by mouth daily., Disp: 90 tablet, Rfl: 3   isosorbide  mononitrate (IMDUR ) 30 MG 24 hr tablet, TAKE 1 TABLET BY MOUTH EVERY MORNING AND TAKE TWO TABLETS BY MOUTH EVERY EVENING, Disp: 270 tablet, Rfl: 3   levothyroxine  (SYNTHROID ) 200 MCG tablet, Take 1 tablet (200 mcg total) by mouth daily. In am on empty stomach, Disp: 90 tablet, Rfl: 3   liothyronine  (CYTOMEL ) 5 MCG tablet, Take 0.5 tablets (2.5 mcg total) by mouth every other day., Disp: 45 tablet, Rfl: 3   losartan  (COZAAR ) 50 MG tablet, TAKE 1 TABLET BY MOUTH DAILY IN THE MORNING AND TAKE 1 TABLET BY MOUTH DAILY AT BEDTIME, Disp: 180 tablet, Rfl: 3   Multiple Vitamins-Minerals (CENTRUM SILVER 50+MEN PO), Take 1 tablet by mouth every morning. , Disp: , Rfl:    OneTouch Delica Lancets 33G MISC, Check blood sugars twice weekly, Disp: 100 each, Rfl: 1   pantoprazole  (PROTONIX ) 40 MG tablet, Take 1 tablet (40 mg total) by mouth daily., Disp: 90 tablet, Rfl: 3   RYBELSUS  7 MG TABS, Take 1 tablet by mouth daily., Disp: , Rfl:

## 2024-05-23 ENCOUNTER — Encounter: Payer: Self-pay | Admitting: Nurse Practitioner

## 2024-05-23 NOTE — Assessment & Plan Note (Signed)
 He is managed with levothyroxine  and Cytomel , with no symptoms of heart palpitations or temperature changes. Continue levothyroxine  and Cytomel .

## 2024-05-23 NOTE — Assessment & Plan Note (Signed)
 His blood pressure is well-controlled at 120/60 mmHg with losartan  and hydrochlorothiazide . Continue losartan  and hydrochlorothiazide .

## 2024-05-23 NOTE — Assessment & Plan Note (Signed)
 His type 2 diabetes is managed with Rybelsus  and glimepiride , with fasting blood sugars between 95-105 mg/dL and an J8r of 3.4% in October. There have been no recent hypoglycemic episodes. Continue Rybelsus  and glimepiride  as prescribed, monitor blood sugar levels regularly, and encourage healthy diet. Follow up with Endocrinology as scheduled.

## 2024-05-23 NOTE — Assessment & Plan Note (Signed)
 He has a history of tongue cancer with multiple oral surgeries and weight loss partially due to loss of taste buds post-surgery. Continue monitoring weight and nutritional status.  No further radiation is needed as per the ENT specialist. Follow up as scheduled.

## 2024-05-23 NOTE — Assessment & Plan Note (Addendum)
 He experiences chronic left-sided sciatica with constant pain and has had a previous adverse reaction to steroid injections. Physical therapy provides some relief, and tramadol  is used for pain management. Continue physical therapy and tramadol  at night. Consider oral prednisone for inflammation. Encourage the use of ice and heat therapy and resumption of stationary biking as tolerated. Follow up with Pain Management as scheduled.

## 2024-05-23 NOTE — Assessment & Plan Note (Signed)
 Managed with Lipitor. Continue Lipitor.

## 2024-07-11 ENCOUNTER — Ambulatory Visit

## 2024-11-07 ENCOUNTER — Ambulatory Visit: Admitting: Nurse Practitioner
